# Patient Record
Sex: Female | Born: 1940 | Race: White | Hispanic: No | Smoking: Never smoker
Health system: Southern US, Community
[De-identification: ages and names within clinical notes are randomized; demographics above are authoritative.]

## PROBLEM LIST (undated history)

## (undated) ENCOUNTER — Emergency Department
Admission: EM | Discharge status: Against Medical Advice | Payer: Medicare Other | Attending: Emergency Medicine | Admitting: Emergency Medicine

## (undated) DIAGNOSIS — C801 Malignant (primary) neoplasm, unspecified: Secondary | ICD-10-CM

## (undated) DIAGNOSIS — Z950 Presence of cardiac pacemaker: Secondary | ICD-10-CM

## (undated) DIAGNOSIS — I4891 Unspecified atrial fibrillation: Secondary | ICD-10-CM

## (undated) DIAGNOSIS — I495 Sick sinus syndrome: Secondary | ICD-10-CM

## (undated) DIAGNOSIS — K219 Gastro-esophageal reflux disease without esophagitis: Secondary | ICD-10-CM

## (undated) DIAGNOSIS — I719 Aortic aneurysm of unspecified site, without rupture: Secondary | ICD-10-CM

## (undated) DIAGNOSIS — J449 Chronic obstructive pulmonary disease, unspecified: Secondary | ICD-10-CM

## (undated) DIAGNOSIS — I10 Essential (primary) hypertension: Secondary | ICD-10-CM

## (undated) DIAGNOSIS — I2510 Atherosclerotic heart disease of native coronary artery without angina pectoris: Secondary | ICD-10-CM

## (undated) DIAGNOSIS — M479 Spondylosis, unspecified: Secondary | ICD-10-CM

## (undated) DIAGNOSIS — H409 Unspecified glaucoma: Secondary | ICD-10-CM

## (undated) DIAGNOSIS — M47815 Spondylosis without myelopathy or radiculopathy, thoracolumbar region: Secondary | ICD-10-CM

## (undated) DIAGNOSIS — I509 Heart failure, unspecified: Secondary | ICD-10-CM

## (undated) DIAGNOSIS — I219 Acute myocardial infarction, unspecified: Secondary | ICD-10-CM

## (undated) DIAGNOSIS — J986 Disorders of diaphragm: Secondary | ICD-10-CM

## (undated) DIAGNOSIS — I671 Cerebral aneurysm, nonruptured: Secondary | ICD-10-CM

## (undated) DIAGNOSIS — I639 Cerebral infarction, unspecified: Secondary | ICD-10-CM

## (undated) DIAGNOSIS — M1990 Unspecified osteoarthritis, unspecified site: Secondary | ICD-10-CM

## (undated) HISTORY — DX: Gastro-esophageal reflux disease without esophagitis: K21.9

## (undated) HISTORY — DX: Sick sinus syndrome: I49.5

## (undated) HISTORY — DX: Aortic aneurysm of unspecified site, without rupture: I71.9

## (undated) HISTORY — DX: Presence of cardiac pacemaker: Z95.0

## (undated) HISTORY — DX: Essential (primary) hypertension: I10

## (undated) HISTORY — DX: Atherosclerotic heart disease of native coronary artery without angina pectoris: I25.10

## (undated) HISTORY — PX: CARDIAC PACEMAKER PLACEMENT: SHX583

## (undated) HISTORY — DX: Chronic obstructive pulmonary disease, unspecified: J44.9

## (undated) HISTORY — PX: HYSTERECTOMY: SHX81

## (undated) HISTORY — DX: Unspecified atrial fibrillation: I48.91

## (undated) HISTORY — DX: Malignant (primary) neoplasm, unspecified: C80.1

## (undated) HISTORY — PX: TOTAL KNEE ARTHROPLASTY: SHX125

## (undated) HISTORY — PX: EYE SURGERY: SHX253

---

## 2000-05-01 DIAGNOSIS — Z950 Presence of cardiac pacemaker: Secondary | ICD-10-CM

## 2000-05-01 HISTORY — DX: Presence of cardiac pacemaker: Z95.0

## 2001-01-31 ENCOUNTER — Ambulatory Visit
Admission: RE | Admit: 2001-01-31 | Discharge status: Against Medical Advice | Payer: Self-pay | Source: Ambulatory Visit

## 2001-02-11 ENCOUNTER — Ambulatory Visit
Admission: RE | Admit: 2001-02-11 | Disposition: A | Discharge status: Home / Self Care | Payer: Self-pay | Source: Ambulatory Visit

## 2001-04-03 ENCOUNTER — Inpatient Hospital Stay
Admission: RE | Admit: 2001-04-03 | Disposition: A | Discharge status: Home / Self Care | Payer: Self-pay | Source: Ambulatory Visit

## 2003-04-28 ENCOUNTER — Inpatient Hospital Stay
Admission: RE | Admit: 2003-04-28 | Disposition: A | Discharge status: Home Health | Payer: Self-pay | Source: Ambulatory Visit

## 2006-01-18 ENCOUNTER — Ambulatory Visit
Admission: RE | Admit: 2006-01-18 | Disposition: A | Discharge status: Home / Self Care | Payer: Self-pay | Source: Ambulatory Visit

## 2006-01-24 ENCOUNTER — Ambulatory Visit
Admission: RE | Admit: 2006-01-24 | Disposition: A | Discharge status: Home / Self Care | Payer: Self-pay | Source: Ambulatory Visit

## 2006-05-03 ENCOUNTER — Inpatient Hospital Stay
Admission: AD | Admit: 2006-05-03 | Disposition: A | Discharge status: Home / Self Care | Payer: Self-pay | Source: Ambulatory Visit

## 2006-06-05 ENCOUNTER — Ambulatory Visit
Admission: RE | Admit: 2006-06-05 | Disposition: A | Discharge status: Home / Self Care | Payer: Self-pay | Source: Ambulatory Visit

## 2006-07-11 ENCOUNTER — Emergency Department
Admission: EM | Admit: 2006-07-11 | Disposition: A | Discharge status: Home / Self Care | Payer: Self-pay | Source: Ambulatory Visit

## 2006-07-13 ENCOUNTER — Inpatient Hospital Stay
Admission: EM | Admit: 2006-07-13 | Disposition: A | Discharge status: Home / Self Care | Payer: Self-pay | Source: Ambulatory Visit

## 2006-12-05 ENCOUNTER — Ambulatory Visit
Admission: RE | Admit: 2006-12-05 | Disposition: A | Discharge status: Home / Self Care | Payer: Self-pay | Source: Ambulatory Visit

## 2007-03-20 ENCOUNTER — Ambulatory Visit
Admission: RE | Admit: 2007-03-20 | Disposition: A | Discharge status: Home / Self Care | Payer: Self-pay | Source: Ambulatory Visit

## 2007-07-20 ENCOUNTER — Ambulatory Visit: Admit: 2007-07-20 | Disposition: A | Discharge status: Home / Self Care | Payer: Self-pay | Source: Ambulatory Visit

## 2011-04-03 ENCOUNTER — Inpatient Hospital Stay: Admission: AD | Admit: 2011-04-03 | Disposition: A | Discharge status: Other Acute Inpatient Hospital | Payer: Self-pay

## 2012-02-10 ENCOUNTER — Encounter (HOSPITAL_COMMUNITY): Payer: Self-pay

## 2012-02-10 ENCOUNTER — Inpatient Hospital Stay (HOSPITAL_COMMUNITY)
Admission: EM | Admit: 2012-02-10 | Discharge: 2012-02-14 | DRG: 193 | Disposition: A | Discharge status: Home / Self Care | Payer: Medicare Other | Attending: Internal Medicine | Admitting: Internal Medicine

## 2012-02-10 ENCOUNTER — Emergency Department (HOSPITAL_COMMUNITY): Payer: Medicare Other

## 2012-02-10 ENCOUNTER — Other Ambulatory Visit: Payer: Self-pay

## 2012-02-10 DIAGNOSIS — D696 Thrombocytopenia, unspecified: Secondary | ICD-10-CM | POA: Diagnosis present

## 2012-02-10 DIAGNOSIS — I252 Old myocardial infarction: Secondary | ICD-10-CM

## 2012-02-10 DIAGNOSIS — Z85828 Personal history of other malignant neoplasm of skin: Secondary | ICD-10-CM

## 2012-02-10 DIAGNOSIS — E875 Hyperkalemia: Secondary | ICD-10-CM | POA: Diagnosis present

## 2012-02-10 DIAGNOSIS — J441 Chronic obstructive pulmonary disease with (acute) exacerbation: Secondary | ICD-10-CM

## 2012-02-10 DIAGNOSIS — R042 Hemoptysis: Secondary | ICD-10-CM

## 2012-02-10 DIAGNOSIS — J9600 Acute respiratory failure, unspecified whether with hypoxia or hypercapnia: Secondary | ICD-10-CM

## 2012-02-10 DIAGNOSIS — Z950 Presence of cardiac pacemaker: Secondary | ICD-10-CM

## 2012-02-10 DIAGNOSIS — M1990 Unspecified osteoarthritis, unspecified site: Secondary | ICD-10-CM | POA: Diagnosis present

## 2012-02-10 DIAGNOSIS — I4891 Unspecified atrial fibrillation: Secondary | ICD-10-CM | POA: Diagnosis present

## 2012-02-10 DIAGNOSIS — Z23 Encounter for immunization: Secondary | ICD-10-CM

## 2012-02-10 DIAGNOSIS — Z7901 Long term (current) use of anticoagulants: Secondary | ICD-10-CM

## 2012-02-10 DIAGNOSIS — Z9981 Dependence on supplemental oxygen: Secondary | ICD-10-CM

## 2012-02-10 DIAGNOSIS — J189 Pneumonia, unspecified organism: Principal | ICD-10-CM | POA: Diagnosis present

## 2012-02-10 DIAGNOSIS — I509 Heart failure, unspecified: Secondary | ICD-10-CM | POA: Diagnosis present

## 2012-02-10 DIAGNOSIS — Z8673 Personal history of transient ischemic attack (TIA), and cerebral infarction without residual deficits: Secondary | ICD-10-CM

## 2012-02-10 DIAGNOSIS — G9340 Encephalopathy, unspecified: Secondary | ICD-10-CM | POA: Diagnosis present

## 2012-02-10 DIAGNOSIS — R410 Disorientation, unspecified: Secondary | ICD-10-CM

## 2012-02-10 DIAGNOSIS — E860 Dehydration: Secondary | ICD-10-CM | POA: Diagnosis present

## 2012-02-10 DIAGNOSIS — I619 Nontraumatic intracerebral hemorrhage, unspecified: Secondary | ICD-10-CM

## 2012-02-10 DIAGNOSIS — E785 Hyperlipidemia, unspecified: Secondary | ICD-10-CM | POA: Diagnosis present

## 2012-02-10 DIAGNOSIS — J449 Chronic obstructive pulmonary disease, unspecified: Secondary | ICD-10-CM

## 2012-02-10 DIAGNOSIS — D649 Anemia, unspecified: Secondary | ICD-10-CM

## 2012-02-10 DIAGNOSIS — I10 Essential (primary) hypertension: Secondary | ICD-10-CM | POA: Diagnosis present

## 2012-02-10 DIAGNOSIS — I671 Cerebral aneurysm, nonruptured: Secondary | ICD-10-CM

## 2012-02-10 DIAGNOSIS — F411 Generalized anxiety disorder: Secondary | ICD-10-CM | POA: Diagnosis present

## 2012-02-10 HISTORY — DX: Malignant (primary) neoplasm, unspecified: C80.1

## 2012-02-10 HISTORY — DX: Cerebral infarction, unspecified: I63.9

## 2012-02-10 HISTORY — DX: Presence of cardiac pacemaker: Z95.0

## 2012-02-10 HISTORY — DX: Disorders of diaphragm: J98.6

## 2012-02-10 HISTORY — DX: Cerebral aneurysm, nonruptured: I67.1

## 2012-02-10 HISTORY — DX: Spondylosis, unspecified: M47.9

## 2012-02-10 HISTORY — DX: Unspecified glaucoma: H40.9

## 2012-02-10 HISTORY — DX: Acute myocardial infarction, unspecified: I21.9

## 2012-02-10 HISTORY — DX: Spondylosis without myelopathy or radiculopathy, thoracolumbar region: M47.815

## 2012-02-10 HISTORY — DX: Unspecified atrial fibrillation: I48.91

## 2012-02-10 HISTORY — DX: Essential (primary) hypertension: I10

## 2012-02-10 HISTORY — DX: Chronic obstructive pulmonary disease, unspecified: J44.9

## 2012-02-10 HISTORY — DX: Heart failure, unspecified: I50.9

## 2012-02-10 HISTORY — DX: Unspecified osteoarthritis, unspecified site: M19.90

## 2012-02-10 MED ORDER — ALBUTEROL SULFATE (5 MG/ML) 0.5% IN NEBU
2.5000 mg | INHALATION_SOLUTION | Freq: Once | RESPIRATORY_TRACT | Status: AC
  Administered 2012-02-10: 2.5 mg via RESPIRATORY_TRACT
  Filled 2012-02-10: qty 0.5

## 2012-02-10 MED ORDER — IPRATROPIUM BROMIDE 0.02 % IN SOLN
0.5000 mg | Freq: Once | RESPIRATORY_TRACT | Status: AC
  Administered 2012-02-10: 0.5 mg via RESPIRATORY_TRACT
  Filled 2012-02-10: qty 2.5

## 2012-02-10 NOTE — ED Provider Notes (Signed)
History     CSN: [id number]  Arrival date & time 02/10/12  2243   First MD Initiated Contact with Patient 02/10/12 2259      Chief Complaint  Patient presents with  . Shortness of Breath  . Altered Mental Status    (Consider location/radiation/quality/duration/timing/severity/associated sxs/prior treatment) HPI 71 year old female presents to emergency room via EMS from her daughter's house with report of shortness of breath along with altered mental status. Patient is visiting from [place]. Patient with history of COPD, atrial fibrillation, hypertension, coronary disease status post MI. Daughter reports patient was in normal health today, took a breathing treatment around 2 PM and then took a nap. Upon waking from the mouth, patient was increasingly more short of breath and confused. Family has noted that she has been eating more congested over the last 24 hours. They deny any fevers. They report patient often has respiratory troubles, usually goes to the local emergency department gets fluid and antibiotics and sent home. They're unsure of the last time she had a respiratory problem. No prior records available. Patient is confused oriented only to self, and is not a reliable historian  Past Medical History  Diagnosis Date  . Arthritis     DJD  . COPD (chronic obstructive pulmonary disease)   . Cancer     skin cancer  . Pacemaker 2002  . A-fib   . Hypertension   . Stroke   . MI (myocardial infarction)   . Brain aneurysm   . Glaucoma     Past Surgical History  Procedure Date  . Total knee arthroplasty     bilaterally  . Eye surgery     catarcts    No family history on file.  History  Substance Use Topics  . Smoking status: Never Smoker   . Smokeless tobacco: Not on file  . Alcohol Use: No    OB History    Grav Para Term Preterm Abortions TAB SAB Ect Mult Living                  Review of Systems  Unable to perform ROS: Mental status change    Allergies    Codeine; Morphine and related; and Sulfa antibiotics  Home Medications   Current Outpatient Rx  Name Route Sig Dispense Refill  . ALBUTEROL SULFATE HFA 108 (90 BASE) MCG/ACT IN AERS Inhalation Inhale 2 puffs into the lungs every 6 (six) hours as needed. For shortness of breath    . ATENOLOL 100 MG PO TABS Oral Take 100 mg by mouth daily.    Marland Kitchen CLONAZEPAM 0.5 MG PO TABS Oral Take 0.5 mg by mouth 2 (two) times daily as needed. For anxiety    . DILTIAZEM HCL ER BEADS 240 MG PO CP24 Oral Take 240 mg by mouth daily.    . FUROSEMIDE 40 MG PO TABS Oral Take 40 mg by mouth daily.    Marland Kitchen HYDROCODONE-ACETAMINOPHEN 10-325 MG PO TABS Oral Take 1 tablet by mouth 3 (three) times daily as needed. For pain    . OMEPRAZOLE 20 MG PO CPDR Oral Take 20 mg by mouth daily.    Marland Kitchen POTASSIUM CHLORIDE CRYS ER 20 MEQ PO TBCR Oral Take 40 mEq by mouth daily.    Marland Kitchen RANITIDINE HCL 150 MG PO TABS Oral Take 150 mg by mouth 2 (two) times daily.    Marland Kitchen ROSUVASTATIN CALCIUM 10 MG PO TABS Oral Take 10 mg by mouth at bedtime.    . SPIRONOLACTONE 50 MG  PO TABS Oral Take 50 mg by mouth daily.    Marland Kitchen VALSARTAN 80 MG PO TABS Oral Take 80 mg by mouth daily.    . WARFARIN SODIUM 5 MG PO TABS Oral Take 2.5 mg by mouth daily.      BP 98/62  Pulse 86  Temp 98.8 F (37.1 C) (Oral)  Resp 18  SpO2 95%  Physical Exam  Nursing note and vitals reviewed. Constitutional:       Frail, elderly  HENT:  Head: Normocephalic and atraumatic.  Nose: Nose normal.  Mouth/Throat: Oropharynx is clear and moist. No oropharyngeal exudate.  Eyes: Conjunctivae normal and EOM are normal. Pupils are equal, round, and reactive to light.  Neck: Normal range of motion. Neck supple. No JVD present. No tracheal deviation present. No thyromegaly present.  Cardiovascular: Exam reveals no gallop and no friction rub.   Murmur heard.      Tachycardia with irregular rate noted  Pulmonary/Chest: No stridor. She is in respiratory distress (mild respiratory  distress). She has wheezes. She exhibits no tenderness.       Rhonchi noted in bilateral lung fields  Abdominal: Soft. Bowel sounds are normal. She exhibits no distension and no mass. There is no tenderness. There is no rebound and no guarding.  Musculoskeletal: Normal range of motion. She exhibits no edema and no tenderness.  Lymphadenopathy:    She has no cervical adenopathy.  Neurological: She is alert. She exhibits normal muscle tone. Coordination normal.       Patient is oriented only to self, is unable to answer questions more than yes or no  Skin: Skin is warm and dry. No rash noted. No erythema. No pallor.    ED Course  Procedures (including critical care time)  Labs Reviewed  PRO B NATRIURETIC PEPTIDE - Abnormal; Notable for the following:    Pro B Natriuretic peptide (BNP) 2149.0 (*)     All other components within normal limits  COMPREHENSIVE METABOLIC PANEL - Abnormal; Notable for the following:    Potassium 5.6 (*)     Glucose, Bld 123 (*)     BUN 45 (*)     Creatinine, Ser 1.14 (*)     Calcium 11.2 (*)     GFR calc non Af Amer 47 (*)     GFR calc Af Amer 54 (*)     All other components within normal limits  URINALYSIS, ROUTINE W REFLEX MICROSCOPIC - Abnormal; Notable for the following:    Leukocytes, UA SMALL (*)     All other components within normal limits  CBC WITH DIFFERENTIAL - Abnormal; Notable for the following:    WBC 10.6 (*)     Platelets 109 (*)  PLATELET COUNT CONFIRMED BY SMEAR   Neutrophils Relative 88 (*)     Neutro Abs 9.3 (*)     Lymphocytes Relative 6 (*)     Lymphs Abs 0.6 (*)     All other components within normal limits  PROTIME-INR - Abnormal; Notable for the following:    Prothrombin Time 25.2 (*)     INR 2.42 (*)     All other components within normal limits  URINE MICROSCOPIC-ADD ON - Abnormal; Notable for the following:    Squamous Epithelial / LPF FEW (*)     Bacteria, UA FEW (*)     All other components within normal limits   TROPONIN I  LACTIC ACID, PLASMA  CULTURE, BLOOD (ROUTINE X 2)  CULTURE, BLOOD (ROUTINE X 2)  GRAM STAIN  LEGIONELLA ANTIGEN, URINE  STREP PNEUMONIAE URINARY ANTIGEN  COMPREHENSIVE METABOLIC PANEL  CBC WITH DIFFERENTIAL  CULTURE, EXPECTORATED SPUTUM-ASSESSMENT  CALCIUM, IONIZED  TROPONIN I  TROPONIN I  TROPONIN I   Dg Chest Port 1 View  02/10/2012  *RADIOLOGY REPORT*  Clinical Data: Altered mental status.  Shortness of breath.  COPD. Prior myocardial infarction.  PORTABLE CHEST - 1 VIEW  Comparison: None.  Findings: The patient is rotated to the left on today's exam, resulting in reduced diagnostic sensitivity and specificity.   Dual lead pacer noted with cardiomegaly and atherosclerotic calcification of the aortic arch.  Airspace opacity noted in the left lower lobe, obscuring the left hemidiaphragm.  Linear atelectasis or scarring noted along the right hemidiaphragm.  IMPRESSION: 1.  Left lower lobe atelectasis or pneumonia.  2.  Cardiomegaly without overt edema.  3.  Subsegmental atelectasis or scarring along the right hemidiaphragm.  4.  Dual lead pacer in place.   Original Report Authenticated By: Dellia Cloud, M.D.     Date: 02/10/2012  Rate: 105  Rhythm: atrial fibrillation  QRS Axis: normal  Intervals: atrial fibrillation  ST/T Wave abnormalities: t wave inversions diffusely  Conduction Disutrbances:atrial fibrillation  Narrative Interpretation:   Old EKG Reviewed: none available     1. Delirium   2. Community acquired pneumonia   3. A-fib   4. COPD (chronic obstructive pulmonary disease)   5. Atrial fibrillation   6. Dehydration   7. Encephalopathy       MDM  71 year old female with shortness of breath and altered mental status. I am concern for possible pneumonia, possible confusion secondary to hypercarbia, urosepsis.  Will check labs, cxr, ua.  Pt with wheezing, will give neb tx.  12:02 AM Pt c/o being unable to breathe.  Her wheezing has resolved  with the breathing treatment, and sats have improved from low 90s to mid 90s.  Possible anxiety component.  Will monitor closely.        Olivia Mackie, MD 02/11/12 [phone]

## 2012-02-10 NOTE — ED Notes (Addendum)
Afternoon Pt began feeling bad took neb tx and lay down and slept until dinner when she woke up for dinner and was SOB. Pt attempted to lay back down but was positionally dyspneic. Pt resited being brought to ED by family until EMS called approx 2230. Pt noted to be confused to date and time and responding inappropriately. Pt is HOH. Pts daughter is at [facility] and both are from [place].

## 2012-02-11 ENCOUNTER — Observation Stay (HOSPITAL_COMMUNITY): Payer: Medicare Other

## 2012-02-11 ENCOUNTER — Encounter (HOSPITAL_COMMUNITY): Payer: Self-pay | Admitting: *Deleted

## 2012-02-11 DIAGNOSIS — E860 Dehydration: Secondary | ICD-10-CM | POA: Diagnosis present

## 2012-02-11 DIAGNOSIS — E875 Hyperkalemia: Secondary | ICD-10-CM | POA: Diagnosis present

## 2012-02-11 DIAGNOSIS — G9340 Encephalopathy, unspecified: Secondary | ICD-10-CM | POA: Diagnosis present

## 2012-02-11 DIAGNOSIS — J189 Pneumonia, unspecified organism: Secondary | ICD-10-CM | POA: Diagnosis present

## 2012-02-11 DIAGNOSIS — J441 Chronic obstructive pulmonary disease with (acute) exacerbation: Secondary | ICD-10-CM

## 2012-02-11 DIAGNOSIS — I10 Essential (primary) hypertension: Secondary | ICD-10-CM | POA: Diagnosis present

## 2012-02-11 DIAGNOSIS — D696 Thrombocytopenia, unspecified: Secondary | ICD-10-CM | POA: Diagnosis present

## 2012-02-11 DIAGNOSIS — I4891 Unspecified atrial fibrillation: Secondary | ICD-10-CM | POA: Diagnosis present

## 2012-02-11 LAB — LACTIC ACID, PLASMA: Lactic Acid, Venous: 1.2 mmol/L (ref 0.5–2.2)

## 2012-02-11 LAB — CBC WITH DIFFERENTIAL/PLATELET
Basophils Absolute: 0 10*3/uL (ref 0.0–0.1)
Basophils Relative: 0 % (ref 0–1)
Eosinophils Absolute: 0 10*3/uL (ref 0.0–0.7)
HCT: 38.4 % (ref 36.0–46.0)
Hemoglobin: 11.5 g/dL — ABNORMAL LOW (ref 12.0–15.0)
Lymphocytes Relative: 15 % (ref 12–46)
Lymphs Abs: 1.4 10*3/uL (ref 0.7–4.0)
MCH: 30.9 pg (ref 26.0–34.0)
MCHC: 33.1 g/dL (ref 30.0–36.0)
Monocytes Absolute: 0.6 10*3/uL (ref 0.1–1.0)
Monocytes Relative: 11 % (ref 3–12)
Neutro Abs: 9.3 10*3/uL — ABNORMAL HIGH (ref 1.7–7.7)
Neutro Abs: 6.7 10*3/uL (ref 1.7–7.7)
Neutrophils Relative %: 88 % — ABNORMAL HIGH (ref 43–77)
Neutrophils Relative %: 74 % (ref 43–77)
Platelets: 109 10*3/uL — ABNORMAL LOW (ref 150–400)
RBC: 3.72 MIL/uL — ABNORMAL LOW (ref 3.87–5.11)
RDW: 13.5 % (ref 11.5–15.5)
WBC: 10.6 10*3/uL — ABNORMAL HIGH (ref 4.0–10.5)

## 2012-02-11 LAB — URINALYSIS, ROUTINE W REFLEX MICROSCOPIC
Ketones, ur: NEGATIVE mg/dL
Nitrite: NEGATIVE
Protein, ur: NEGATIVE mg/dL
Urobilinogen, UA: 0.2 mg/dL (ref 0.0–1.0)

## 2012-02-11 LAB — COMPREHENSIVE METABOLIC PANEL
AST: 20 U/L (ref 0–37)
Alkaline Phosphatase: 97 U/L (ref 39–117)
BUN: 45 mg/dL — ABNORMAL HIGH (ref 6–23)
CO2: 24 mEq/L (ref 19–32)
Calcium: 10.1 mg/dL (ref 8.4–10.5)
Chloride: 101 mEq/L (ref 96–112)
Creatinine, Ser: 1.14 mg/dL — ABNORMAL HIGH (ref 0.50–1.10)
Creatinine, Ser: 0.81 mg/dL (ref 0.50–1.10)
GFR calc Af Amer: 54 mL/min — ABNORMAL LOW (ref >90)
GFR calc non Af Amer: 47 mL/min — ABNORMAL LOW (ref >90)
GFR calc non Af Amer: 70 mL/min — ABNORMAL LOW (ref >90)
Glucose, Bld: 123 mg/dL — ABNORMAL HIGH (ref 70–99)
Potassium: 5.6 mEq/L — ABNORMAL HIGH (ref 3.5–5.1)
Total Bilirubin: 0.5 mg/dL (ref 0.3–1.2)

## 2012-02-11 LAB — URINE MICROSCOPIC-ADD ON

## 2012-02-11 LAB — TROPONIN I: Troponin I: <0.3 ng/mL (ref <0.30)

## 2012-02-11 LAB — EXPECTORATED SPUTUM ASSESSMENT W GRAM STAIN, RFLX TO RESP C: Special Requests: NORMAL

## 2012-02-11 LAB — STREP PNEUMONIAE URINARY ANTIGEN: Strep Pneumo Urinary Antigen: NEGATIVE

## 2012-02-11 LAB — CALCIUM, IONIZED: Calcium, Ion: 1.33 mmol/L — ABNORMAL HIGH (ref 1.12–1.32)

## 2012-02-11 LAB — PROTIME-INR: Prothrombin Time: 25.2 seconds — ABNORMAL HIGH (ref 11.6–15.2)

## 2012-02-11 MED ORDER — DEXTROSE 5 % IV SOLN
1.0000 g | INTRAVENOUS | Status: DC
  Administered 2012-02-11 – 2012-02-13 (×3): 1 g via INTRAVENOUS
  Filled 2012-02-11 (×5): qty 10

## 2012-02-11 MED ORDER — DEXTROSE 5 % IV SOLN
500.0000 mg | Freq: Once | INTRAVENOUS | Status: AC
  Administered 2012-02-11: 500 mg via INTRAVENOUS
  Filled 2012-02-11: qty 500

## 2012-02-11 MED ORDER — ATORVASTATIN CALCIUM 20 MG PO TABS
20.0000 mg | ORAL_TABLET | Freq: Every day | ORAL | Status: DC
  Administered 2012-02-11 – 2012-02-13 (×3): 20 mg via ORAL
  Filled 2012-02-11 (×4): qty 1

## 2012-02-11 MED ORDER — SODIUM CHLORIDE 0.9 % IV SOLN: 250.0000 mL | INTRAVENOUS | Status: DC | PRN

## 2012-02-11 MED ORDER — BENZONATATE 100 MG PO CAPS
100.0000 mg | ORAL_CAPSULE | Freq: Two times a day (BID) | ORAL | Status: DC | PRN
  Administered 2012-02-11: 100 mg via ORAL
  Filled 2012-02-11 (×2): qty 1

## 2012-02-11 MED ORDER — ALBUTEROL SULFATE (5 MG/ML) 0.5% IN NEBU
2.5000 mg | INHALATION_SOLUTION | RESPIRATORY_TRACT | Status: DC | PRN
  Filled 2012-02-11: qty 0.5

## 2012-02-11 MED ORDER — CLONAZEPAM 0.5 MG PO TABS
0.5000 mg | ORAL_TABLET | Freq: Two times a day (BID) | ORAL | Status: DC
  Administered 2012-02-11 – 2012-02-14 (×7): 0.5 mg via ORAL
  Filled 2012-02-11 (×7): qty 1

## 2012-02-11 MED ORDER — LORAZEPAM 1 MG PO TABS
0.5000 mg | ORAL_TABLET | Freq: Once | ORAL | Status: AC
  Administered 2012-02-11: 0.5 mg via ORAL
  Filled 2012-02-11: qty 1

## 2012-02-11 MED ORDER — METHYLPREDNISOLONE SODIUM SUCC 125 MG IJ SOLR
60.0000 mg | Freq: Two times a day (BID) | INTRAMUSCULAR | Status: DC
  Administered 2012-02-11 – 2012-02-12 (×3): 60 mg via INTRAVENOUS
  Filled 2012-02-11 (×4): qty 0.96

## 2012-02-11 MED ORDER — PANTOPRAZOLE SODIUM 40 MG PO TBEC
40.0000 mg | DELAYED_RELEASE_TABLET | Freq: Every day | ORAL | Status: DC
  Administered 2012-02-11 – 2012-02-14 (×4): 40 mg via ORAL
  Filled 2012-02-11 (×5): qty 1

## 2012-02-11 MED ORDER — HYDROCODONE-ACETAMINOPHEN 5-325 MG PO TABS
1.0000 | ORAL_TABLET | Freq: Four times a day (QID) | ORAL | Status: DC | PRN
  Administered 2012-02-11 – 2012-02-14 (×6): 1 via ORAL
  Filled 2012-02-11 (×6): qty 1

## 2012-02-11 MED ORDER — POLYETHYLENE GLYCOL 3350 17 G PO PACK
17.0000 g | PACK | Freq: Every day | ORAL | Status: DC | PRN
  Filled 2012-02-11: qty 1

## 2012-02-11 MED ORDER — FAMOTIDINE 10 MG PO TABS
10.0000 mg | ORAL_TABLET | Freq: Every day | ORAL | Status: DC
  Administered 2012-02-11 – 2012-02-14 (×4): 10 mg via ORAL
  Filled 2012-02-11 (×4): qty 1

## 2012-02-11 MED ORDER — INFLUENZA VIRUS VACC SPLIT PF IM SUSP
0.5000 mL | INTRAMUSCULAR | Status: AC
  Filled 2012-02-11: qty 0.5

## 2012-02-11 MED ORDER — DM-GUAIFENESIN ER 30-600 MG PO TB12
1.0000 | ORAL_TABLET | Freq: Two times a day (BID) | ORAL | Status: DC
  Administered 2012-02-11 – 2012-02-14 (×7): 1 via ORAL
  Filled 2012-02-11 (×9): qty 1

## 2012-02-11 MED ORDER — SODIUM CHLORIDE 0.9 % IJ SOLN
3.0000 mL | Freq: Two times a day (BID) | INTRAMUSCULAR | Status: DC
  Administered 2012-02-11 – 2012-02-13 (×4): 3 mL via INTRAVENOUS

## 2012-02-11 MED ORDER — SODIUM POLYSTYRENE SULFONATE 15 GM/60ML PO SUSP
30.0000 g | Freq: Once | ORAL | Status: AC
  Administered 2012-02-11: 30 g via ORAL
  Filled 2012-02-11: qty 60

## 2012-02-11 MED ORDER — SENNOSIDES-DOCUSATE SODIUM 8.6-50 MG PO TABS
1.0000 | ORAL_TABLET | Freq: Every evening | ORAL | Status: DC | PRN
  Administered 2012-02-11: 1 via ORAL
  Filled 2012-02-11 (×2): qty 1

## 2012-02-11 MED ORDER — ALBUTEROL SULFATE HFA 108 (90 BASE) MCG/ACT IN AERS: 2.0000 | INHALATION_SPRAY | Freq: Four times a day (QID) | RESPIRATORY_TRACT | Status: DC | PRN

## 2012-02-11 MED ORDER — IPRATROPIUM BROMIDE 0.02 % IN SOLN
0.5000 mg | Freq: Four times a day (QID) | RESPIRATORY_TRACT | Status: DC
  Administered 2012-02-11 – 2012-02-13 (×7): 0.5 mg via RESPIRATORY_TRACT
  Filled 2012-02-11 (×9): qty 2.5

## 2012-02-11 MED ORDER — SODIUM CHLORIDE 0.9 % IV SOLN
INTRAVENOUS | Status: DC
  Administered 2012-02-11: 06:00:00 via INTRAVENOUS

## 2012-02-11 MED ORDER — ALBUTEROL SULFATE (5 MG/ML) 0.5% IN NEBU
2.5000 mg | INHALATION_SOLUTION | Freq: Four times a day (QID) | RESPIRATORY_TRACT | Status: DC
  Administered 2012-02-11 – 2012-02-13 (×7): 2.5 mg via RESPIRATORY_TRACT
  Filled 2012-02-11 (×8): qty 0.5

## 2012-02-11 MED ORDER — SODIUM CHLORIDE 0.9 % IJ SOLN: 3.0000 mL | INTRAMUSCULAR | Status: DC | PRN

## 2012-02-11 MED ORDER — AZITHROMYCIN 500 MG PO TABS
500.0000 mg | ORAL_TABLET | ORAL | Status: DC
  Administered 2012-02-11 – 2012-02-13 (×3): 500 mg via ORAL
  Filled 2012-02-11 (×4): qty 1

## 2012-02-11 MED ORDER — DEXTROSE 5 % IV SOLN
1.0000 g | Freq: Once | INTRAVENOUS | Status: AC
  Administered 2012-02-11: 1 g via INTRAVENOUS
  Filled 2012-02-11: qty 10

## 2012-02-11 NOTE — ED Notes (Signed)
Antibiotics started after blood cultures were drawn.

## 2012-02-11 NOTE — ED Notes (Signed)
Paulette Blanch (daughter)[phone], call if need anything

## 2012-02-11 NOTE — H&P (Signed)
PCP:  unknown   Chief Complaint:   Confusion and shortness of breath.  HPI: Beth Greer is a 71 y.o. female   has a past medical history of Arthritis; COPD (chronic obstructive pulmonary disease); Cancer; Pacemaker (2002); A-fib; Hypertension; Stroke; MI (myocardial infarction); Brain aneurysm; and Glaucoma.   Presented with  Patient is confused and unable to provide much of any history. No family at bedside. Per ER MD she was brought in from home with confusion and worsening shortness of breath. She has history of COPD and apparently became more confused after doing her breathing treatment today. She was brought in to ED  XR showed possible PNA labs were significant for mild hypercalcemia, elevated K at 5.6 and thrombocytopenia 109. No prior labs are available. ABG was attempted but unsuccessfully. She has hx of atrial fibrillation and is on coumadin.   Review of Systems:  Unable to obtain.   Past Medical History: Past Medical History  Diagnosis Date  . Arthritis     DJD  . COPD (chronic obstructive pulmonary disease)   . Cancer     skin cancer  . Pacemaker 2002  . A-fib   . Hypertension   . Stroke   . MI (myocardial infarction)   . Brain aneurysm   . Glaucoma    Past Surgical History  Procedure Date  . Total knee arthroplasty     bilaterally  . Eye surgery     catarcts     Medications: Prior to Admission medications   Medication Sig Start Date End Date Taking? Authorizing Provider  albuterol (PROVENTIL HFA;VENTOLIN HFA) 108 (90 BASE) MCG/ACT inhaler Inhale 2 puffs into the lungs every 6 (six) hours as needed. For shortness of breath   Yes Historical Provider, MD  atenolol (TENORMIN) 100 MG tablet Take 100 mg by mouth daily.   Yes Historical Provider, MD  CALCIUM CARBONATE PO Take 1 tablet by mouth daily.   Yes Historical Provider, MD  clonazePAM (KLONOPIN) 0.5 MG tablet Take 0.5 mg by mouth 2 (two) times daily as needed. For anxiety   Yes Historical Provider,  MD  diltiazem (TIAZAC) 240 MG 24 hr capsule Take 240 mg by mouth daily.   Yes Historical Provider, MD  furosemide (LASIX) 40 MG tablet Take 40 mg by mouth daily.   Yes Historical Provider, MD  HYDROcodone-acetaminophen (NORCO) 10-325 MG per tablet Take 1 tablet by mouth 3 (three) times daily as needed. For pain   Yes Historical Provider, MD  omeprazole (PRILOSEC) 20 MG capsule Take 20 mg by mouth daily.   Yes Historical Provider, MD  potassium chloride SA (K-DUR,KLOR-CON) 20 MEQ tablet Take 40 mEq by mouth daily.   Yes Historical Provider, MD  ranitidine (ZANTAC) 150 MG tablet Take 150 mg by mouth 2 (two) times daily.   Yes Historical Provider, MD  rosuvastatin (CRESTOR) 10 MG tablet Take 10 mg by mouth at bedtime.   Yes Historical Provider, MD  spironolactone (ALDACTONE) 50 MG tablet Take 50 mg by mouth daily.   Yes Historical Provider, MD  valsartan (DIOVAN) 80 MG tablet Take 80 mg by mouth daily.   Yes Historical Provider, MD  warfarin (COUMADIN) 5 MG tablet Take 2.5 mg by mouth daily.   Yes Historical Provider, MD    Allergies:   Allergies  Allergen Reactions  . Codeine     unknown  . Morphine And Related     unknown  . Sulfa Antibiotics     unknown    Social History:  reports that she has never smoked. She does not have any smokeless tobacco history on file. She reports that she does not drink alcohol.   Family History: family history is not on file.    Physical Exam: Patient Vitals for the past 24 hrs:  BP Temp Temp src Pulse Resp SpO2  02/11/12 0211 - 98.2 F (36.8 C) Oral - - -  02/11/12 0200 108/81 mmHg - - 97  19  96 %  02/11/12 0045 137/85 mmHg - - 98  24  96 %  02/11/12 0000 129/70 mmHg - - 107  13  96 %  02/10/12 2340 - - - 86  18  95 %  02/10/12 2331 98/62 mmHg - - 96  22  93 %  02/10/12 2330 98/62 mmHg - - 108  22  96 %  02/10/12 2307 123/86 mmHg 98.8 F (37.1 C) Oral 94  20  93 %    1. General:  in No Acute distress 2. Psychological: Alert but not   Oriented 3. Head/ENT:    Dry Mucous Membranes                          Head Non traumatic, neck supple                          Normal Dentition 4. SKIN: decreased Skin turgor,  Skin clean Dry and intact no rash 5. Heart: Regular rate and rhythm no Murmur, Rub or gallop 6. Lungs: no wheezes but some coarse crackles noted   7. Abdomen: Soft, non-tender, Non distended 8. Lower extremities: no clubbing, cyanosis, or edema 9. Neurologically Grossly intact, moving all 4 extremities equally 10. MSK: Normal range of motion  body mass index is unknown because there is no height or weight on file.   Labs on Admission:   [facility] 02/10/12 2315  NA 135  K 5.6*  CL 101  CO2 22  GLUCOSE 123*  BUN 45*  CREATININE 1.14*  CALCIUM 11.2*  MG --  PHOS --    Basename 02/10/12 2315  AST 24  ALT 14  ALKPHOS 97  BILITOT 0.5  PROT 7.5  ALBUMIN 4.6   No results found for this basename: LIPASE:2,AMYLASE:2 in the last 72 hours  Basename 02/10/12 2315  WBC 10.6*  NEUTROABS 9.3*  HGB 12.7  HCT 38.4  MCV 94.8  PLT 109*    Basename 02/10/12 2315  CKTOTAL --  CKMB --  CKMBINDEX --  TROPONINI <0.30   No results found for this basename: TSH,T4TOTAL,FREET3,T3FREE,THYROIDAB in the last 72 hours No results found for this basename: VITAMINB12:2,FOLATE:2,FERRITIN:2,TIBC:2,IRON:2,RETICCTPCT:2 in the last 72 hours No results found for this basename: HGBA1C    CrCl is unknown because there is no height on file for the current visit. ABG No results found for this basename: phart, pco2, po2, hco3, tco2, acidbasedef, o2sat     No results found for this basename: DDIMER    Lactic acid 1.2  Other results:  I have pearsonaly reviewed this: ECG REPORT  Rate: 105  Rhythm: Atrial fibrilation  ST&T Change: diffuse ST changes.  UA no evidence of infection BNP 2149  Cultures: No results found for this basename: sdes, specrequest, cult, reptstatus       Radiological Exams on  Admission: Dg Chest Port 1 View  02/10/2012  *RADIOLOGY REPORT*  Clinical Data: Altered mental status.  Shortness of breath.  COPD. Prior myocardial infarction.  PORTABLE CHEST - 1 VIEW  Comparison: None.  Findings: The patient is rotated to the left on today's exam, resulting in reduced diagnostic sensitivity and specificity.   Dual lead pacer noted with cardiomegaly and atherosclerotic calcification of the aortic arch.  Airspace opacity noted in the left lower lobe, obscuring the left hemidiaphragm.  Linear atelectasis or scarring noted along the right hemidiaphragm.  IMPRESSION: 1.  Left lower lobe atelectasis or pneumonia.  2.  Cardiomegaly without overt edema.  3.  Subsegmental atelectasis or scarring along the right hemidiaphragm.  4.  Dual lead pacer in place.   Original Report Authenticated By: Dellia Cloud, M.D.     Chart has been reviewed  Assessment/Plan  71 yo F with altered mental status and evidence of PNA on CXR  Present on Admission:  .Community acquired pneumonia - will admit and initiate IV antibiotics, obtain sputum cultures .Encephalopathy - likely multifactorial, could be due to infection, hypercalcemia is very mild, could be due to medication withdrawal will make sure she is taking her klonapin. Will obtain CT of the head. If no improvement consider further imaging in AM  .Hypertension - hold her medications given transient hypotension in ED. Marland KitchenAtrial fibrillation - continue warfarin after Head CT is done is no evidence of hemorrhage.  .Thrombocytopenia - Unclear if this is acute in onset will continue to monitor would be helpful to obtain records.  .Dehydration - Will give gentle IVF .Hypercalcemia - give IVF and repeat Hyperkalemia - patient was given kayexalate in ED will follow up K in am and monitor on telemetry    Prophylaxis: SCD  Protonix  CODE STATUS: presumed to be FULL CODE but would need to discuss in AM.  Other plan as per orders.  I have spent  a total of 55 min on this admission  Annaleah Arata 02/11/2012, 2:59 AM

## 2012-02-11 NOTE — Progress Notes (Signed)
  Echocardiogram 2D Echocardiogram has been performed.  [name] 02/11/2012, 11:39 AM

## 2012-02-11 NOTE — Progress Notes (Addendum)
[facility] PROGRESS NOTE  ANABIA WEATHERWAX ZOX:[id number] DOB: February 07, 1941 DOA: 02/10/2012 PCP: Provider Not In System  Assessment/Plan:  Patient Active Problem List  Diagnosis  . Community acquired pneumonia  . Encephalopathy  . Hypertension  . Atrial fibrillation  . Thrombocytopenia  . Dehydration  . Hypercalcemia  . Hyperkalemia    Confusion/AMS:  Resolved.  Was likely due to PNA/COPD exacerbation that led to dehydration from poor PO and continued use of potassium/lasix.  She also takes narcotics and may have had some lingering effects in the setting of dehydration-related AKI.    CT head showed possible calcification versus small hemorrhagic stroke in basal ganglion.  Patient has known small aneurysm -  Repeat dry head CT  Community acquired pneumonia:  Patient presented with possible left lower lobe pneumonia versus atelectasis and increased work of breathing.  No fevers or leukocytosis. -  Strep pneumo ag neg -  Ceftriaxone, day 1 -  Azithromycin, day 1  Acute COPD exacerbation:  Patient uses home oxygen.  Per family, she has had multiple hospitalizations for COPD, but never intubated, never in the ICU.   -  Start solumedrol -  Duonebs q6h -  Alb q2h prn -  Continue abx as above -  Peak flow  Dehydration with hyperkalemia and hypercalcemia and mildly elevated BUN:Cr:  Improved after gentle hydration overnight.  Patient has history of CHF and is on lasix, potassium, tums at home.   - Continue to hold lasix -  D/C IVF  CHF/atrial fibrillation/HTN/HLD:  Patient still appears a little dry, but is starting to eat and drink.  Telemetry demonstrates atrial fibrillation with RVR to 120s intermittently.  ECG are stable with a-fib and T wave inversions in inferior leads and in V3-V6.  Blood pressure still in low end of normal range.  Currently holding BB and dilt.   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  No need for transfusion at this time.  Patient not on heparin -  Trend  DIET:  Healthy heart ACCESS: PIV IVF:  OFF PROPH:  SCDs  Code Status: Full code Family Communication: Spoke with patient and with daughter Wilkie Aye at [phone].   Disposition Plan: Pending improvement in respiratory status and blood pressure likely to home.  Will have PT eval as outpatient if needed.     Consultants:  None  Procedures:  CT head 10/13  CT head pending  ECHO 10/13  Antibiotics:  Ceftriaxone 10/13>>  Azithromycin 10/13>>  HPI/Subjective:  Patient states she continues to feel Javaria Knapke of breath this morning and has a lot of nonproductive cough, although she cannot cough anything up right now.  Denies nausea, vomiting, diarrhea.    Objective: Filed Vitals:   02/11/12 0200 02/11/12 0211 02/11/12 0245 02/11/12 0345  BP: 108/81  107/72 116/63  Pulse: 97  86   Temp:  98.2 F (36.8 C)  98.1 F (36.7 C)  TempSrc:  Oral  Oral  Resp: 19  16 16  Height:    5\' 5"  (1.651 m)  Weight:    72.167 kg (159 lb 1.6 oz)  SpO2: 96%  97% 95%    Intake/Output Summary (Last 24 hours) at 02/11/12 1230 Last data filed at 02/11/12 0211  Gross per 24 hour  Intake      0 ml  Output    300 ml  Net   -300 ml   Filed Weights   02/11/12 0345  Weight: 72.167 kg (159 lb 1.6 oz)    Exam:   General:  CF, moderate respiratory distress with SCM and suprasternal retractions on nasal canula  HEENT:   Mildly tachy MMM  Cardiovascular:  Irregularly  irregular, no m/r/g.    Respiratory: Rales at bilateral bases, frequent cough, diminished at bases, prolonged expiratory phase and wheeze   Abdomen: NABS, soft, nondistended, nontender  MSK:  Trace LEE, tender along medial tibias bilaterallly without erythema  Data Reviewed: Basic Metabolic Panel:  Lab 02/11/12 0454 02/10/12 2315  NA 135 135  K 4.2 5.6*  CL 101 101  CO2 24 22  GLUCOSE 108* 123*  BUN 34* 45*  CREATININE 0.81 1.14*  CALCIUM 10.1 11.2*  MG -- --  PHOS -- --   Liver Function Tests:  Lab 02/11/12 0941 02/10/12 2315  AST 20 24  ALT 12 14  ALKPHOS 74 97  BILITOT 0.6 0.5  PROT 6.4 7.5  ALBUMIN 3.9 4.6   No results found for this basename: LIPASE:5,AMYLASE:5 in the last 168 hours No results found for this basename: AMMONIA:5 in the last 168 hours CBC:  Lab 02/11/12 0941 02/10/12 2315  WBC 9.1 10.6*  NEUTROABS 6.7 9.3*  HGB 11.5* 12.7  HCT 34.8* 38.4  MCV 93.5 94.8  PLT 97* 109*   Cardiac Enzymes:  Lab 02/11/12 0941 02/10/12 2315  CKTOTAL -- --  CKMB -- --  CKMBINDEX -- --  TROPONINI <0.30 <0.30   BNP (last 3 results)  Basename 02/10/12 2315  PROBNP 2149.0*   CBG: No results found for this basename: GLUCAP:5 in the last 168 hours  No results found for this or any previous visit (from the past 240 hour(s)).   Studies: Ct Head Wo Contrast  02/11/2012  *RADIOLOGY REPORT*  Clinical Data: Altered mental status  CT HEAD WITHOUT CONTRAST  Technique:  Contiguous axial images were obtained from the base of the skull through the vertex without contrast.  Comparison: None.  Findings: Motion degraded images.  No evidence of parenchymal hemorrhage or extra-axial fluid collection. No mass lesion, mass effect, or midline shift.  No CT evidence of acute infarction.  Mild hyperdensity in the left basal ganglia, favored to reflect calcification given the lack of surrounding vasogenic edema.  Additional apparent hyperdensity in the left frontal lobe this likely  related to streak artifact (series 2/image 18).  Subcortical white matter and periventricular small vessel ischemic changes.  The visualized paranasal sinuses are essentially clear. The mastoid air cells are unopacified.  No evidence of calvarial fracture.  IMPRESSION: Motion degraded images.  No evidence of acute intracranial abnormality.  Mild hyperdensity in the left basal ganglia is favored to reflect benign calcification.  If there is continued clinical concern, consider a follow-up CT in 6 hours to document stability.   Original Report Authenticated By: Charline Bills, M.D.    Dg Chest Port 1 View  02/10/2012  *RADIOLOGY REPORT*  Clinical Data: Altered mental status.  Shortness of breath.  COPD. Prior myocardial infarction.  PORTABLE CHEST -  1 VIEW  Comparison: None.  Findings: The patient is rotated to the left on today's exam, resulting in reduced diagnostic sensitivity and specificity.   Dual lead pacer noted with cardiomegaly and atherosclerotic calcification of the aortic arch.  Airspace opacity noted in the left lower lobe, obscuring the left hemidiaphragm.  Linear atelectasis or scarring noted along the right hemidiaphragm.  IMPRESSION: 1.  Left lower lobe atelectasis or pneumonia.  2.  Cardiomegaly without overt edema.  3.  Subsegmental atelectasis or scarring along the right hemidiaphragm.  4.  Dual lead pacer in place.   Original Report Authenticated By: Dellia Cloud, M.D.     Scheduled Meds:   . albuterol  2.5 mg Nebulization Once  . ipratropium  0.5 mg Nebulization Q6H   And  . albuterol  2.5 mg Nebulization Q6H  . atorvastatin  20 mg Oral q1800  . azithromycin (ZITHROMAX) 500 MG IVPB  500 mg Intravenous Once  . azithromycin  500 mg Oral Q24H  . cefTRIAXone (ROCEPHIN)  IV  1 g Intravenous Once  . cefTRIAXone (ROCEPHIN)  IV  1 g Intravenous Q24H  . clonazePAM  0.5 mg Oral BID  . dextromethorphan-guaiFENesin  1 tablet Oral BID  . famotidine  10 mg Oral Daily  .  influenza  inactive virus vaccine  0.5 mL Intramuscular Tomorrow-1000  . ipratropium  0.5 mg Nebulization Once  . LORazepam  0.5 mg Oral Once  . methylPREDNISolone (SOLU-MEDROL) injection  60 mg Intravenous Q12H  . pantoprazole  40 mg Oral Q1200  . sodium chloride  3 mL Intravenous Q12H  . sodium polystyrene  30 g Oral Once   Continuous Infusions:   . DISCONTD: sodium chloride 50 mL/hr at 02/11/12 1610    Principal Problem:  *Community acquired pneumonia Active Problems:  Encephalopathy  Hypertension  Atrial fibrillation  Thrombocytopenia  Dehydration  Hypercalcemia  Hyperkalemia    Time spent: 45 minutes including talking with daughter to obtain additional history regarding PMHx and current problems leading to admission    Renae Fickle  [facility] Pager [phone]. If 8PM-8AM, please contact night-coverage at [email or web address], password [facility] 02/11/2012, 12:30 PM  LOS: 1 day

## 2012-02-12 DIAGNOSIS — R404 Transient alteration of awareness: Secondary | ICD-10-CM

## 2012-02-12 DIAGNOSIS — J9600 Acute respiratory failure, unspecified whether with hypoxia or hypercapnia: Secondary | ICD-10-CM

## 2012-02-12 DIAGNOSIS — R042 Hemoptysis: Secondary | ICD-10-CM

## 2012-02-12 DIAGNOSIS — D649 Anemia, unspecified: Secondary | ICD-10-CM

## 2012-02-12 DIAGNOSIS — I619 Nontraumatic intracerebral hemorrhage, unspecified: Secondary | ICD-10-CM

## 2012-02-12 LAB — BASIC METABOLIC PANEL
BUN: 43 mg/dL — ABNORMAL HIGH (ref 6–23)
Chloride: 98 mEq/L (ref 96–112)
Creatinine, Ser: 0.93 mg/dL (ref 0.50–1.10)
GFR calc Af Amer: 69 mL/min — ABNORMAL LOW (ref >90)
GFR calc non Af Amer: 60 mL/min — ABNORMAL LOW (ref >90)
Glucose, Bld: 144 mg/dL — ABNORMAL HIGH (ref 70–99)

## 2012-02-12 LAB — CBC
HCT: 36.4 % (ref 36.0–46.0)
Hemoglobin: 12.4 g/dL (ref 12.0–15.0)
MCH: 31.6 pg (ref 26.0–34.0)
MCHC: 34.1 g/dL (ref 30.0–36.0)
RDW: 13.2 % (ref 11.5–15.5)

## 2012-02-12 LAB — PROTIME-INR: Prothrombin Time: 21.3 seconds — ABNORMAL HIGH (ref 11.6–15.2)

## 2012-02-12 LAB — LEGIONELLA ANTIGEN, URINE

## 2012-02-12 LAB — URINE CULTURE

## 2012-02-12 MED ORDER — METHYLPREDNISOLONE SODIUM SUCC 125 MG IJ SOLR
60.0000 mg | Freq: Every day | INTRAMUSCULAR | Status: DC
  Administered 2012-02-13: 60 mg via INTRAVENOUS
  Filled 2012-02-12: qty 0.96

## 2012-02-12 MED ORDER — FUROSEMIDE 40 MG PO TABS
40.0000 mg | ORAL_TABLET | Freq: Every day | ORAL | Status: DC
  Administered 2012-02-12 – 2012-02-13 (×2): 40 mg via ORAL
  Filled 2012-02-12 (×2): qty 1

## 2012-02-12 MED ORDER — ASPIRIN EC 81 MG PO TBEC
81.0000 mg | DELAYED_RELEASE_TABLET | Freq: Every day | ORAL | Status: DC
  Administered 2012-02-13 – 2012-02-14 (×2): 81 mg via ORAL
  Filled 2012-02-12 (×3): qty 1

## 2012-02-12 MED ORDER — LATANOPROST 0.005 % OP SOLN
1.0000 [drp] | Freq: Every day | OPHTHALMIC | Status: DC
  Administered 2012-02-12 – 2012-02-13 (×2): 1 [drp] via OPHTHALMIC
  Filled 2012-02-12 (×2): qty 2.5

## 2012-02-12 MED ORDER — BENZONATATE 100 MG PO CAPS
100.0000 mg | ORAL_CAPSULE | Freq: Two times a day (BID) | ORAL | Status: DC
  Administered 2012-02-12 – 2012-02-14 (×4): 100 mg via ORAL
  Filled 2012-02-12 (×6): qty 1

## 2012-02-12 MED ORDER — DILTIAZEM HCL ER COATED BEADS 120 MG PO CP24
120.0000 mg | ORAL_CAPSULE | Freq: Every day | ORAL | Status: DC
  Administered 2012-02-12 – 2012-02-14 (×3): 120 mg via ORAL
  Filled 2012-02-12 (×3): qty 1

## 2012-02-12 MED ORDER — GABAPENTIN 600 MG PO TABS
300.0000 mg | ORAL_TABLET | Freq: Every day | ORAL | Status: DC
  Administered 2012-02-12 – 2012-02-14 (×3): 300 mg via ORAL
  Filled 2012-02-12 (×3): qty 0.5

## 2012-02-12 NOTE — Progress Notes (Signed)
Physical Therapy Evaluation Patient Details Name: Beth Greer MRN: [id number] DOB: 02-04-1941 Today's Date: 02/12/2012 Time: 7425-9563 PT Time Calculation (min): 40 min  PT Assessment / Plan / Recommendation Clinical Impression  Pt is 71 yo female with PNA and A-fib who is experiencing generalized weakness and increased HR which is currently limiting Beth mobility.  Recommend acute PT to help increase strength and activity to help pt return to mod I status for d/c home.  Pt will not likely need f/u PT after d/c.    PT Assessment  Patient needs continued PT services    Follow Up Recommendations  No PT follow up;Supervision - Intermittent    Does the patient have the potential to tolerate intense rehabilitation      Barriers to Discharge None      Equipment Recommendations  None recommended by PT    Recommendations for Other Services     Frequency Min 3X/week    Precautions / Restrictions Precautions Precautions: Fall Restrictions Weight Bearing Restrictions: No   Pertinent Vitals/Pain Supine BP 129/67 HR 95 bpm Sitting BP 128/76  HR 138 bpm Standing BP 128/102 HR 183 HR with ambulation 120s to 155 bpm,, nsg aware HR after ambulation 107 O2 sats 99% on 2L O2      Mobility  Bed Mobility Bed Mobility: Supine to Sit Supine to Sit: 5: Supervision;HOB elevated Details for Bed Mobility Assistance: pt able to get to side of bed with increased time but without physical assist Transfers Transfers: Sit to Stand;Stand to Sit Sit to Stand: From bed;From chair/3-in-1;With upper extremity assist;4: Min guard Stand to Sit: 4: Min guard;To chair/3-in-1;To bed;With upper extremity assist Details for Transfer Assistance: vc's for hand placement and to keep RW with Beth all the way to chair Ambulation/Gait Ambulation/Gait Assistance: 4: Min assist Ambulation Distance (Feet): 150 Feet Assistive device: Rolling walker Ambulation/Gait Assistance Details: pt ambulates with kyphotic  posture and HR up to 154 without symptoms, pt very driven to ambulate Gait Pattern: Step-through pattern;Trunk flexed Gait velocity: decreased Stairs: No Wheelchair Mobility Wheelchair Mobility: No    Shoulder Instructions     Exercises     PT Diagnosis: Generalized weakness  PT Problem List: Decreased strength;Decreased activity tolerance;Decreased mobility;Decreased cognition PT Treatment Interventions: DME instruction;Stair training;Gait training;Functional mobility training;Therapeutic activities;Therapeutic exercise;Patient/family education   PT Goals Acute Rehab PT Goals PT Goal Formulation: With patient Time For Goal Achievement: 02/26/12 Potential to Achieve Goals: Good Pt will go Supine/Side to Sit: with modified independence PT Goal: Supine/Side to Sit - Progress: Goal set today Pt will go Sit to Supine/Side: with modified independence PT Goal: Sit to Supine/Side - Progress: Goal set today Pt will go Sit to Stand: with modified independence PT Goal: Sit to Stand - Progress: Goal set today Pt will go Stand to Sit: with modified independence PT Goal: Stand to Sit - Progress: Goal set today Pt will Ambulate: >150 feet;with modified independence;with least restrictive assistive device PT Goal: Ambulate - Progress: Goal set today Pt will Go Up / Down Stairs: 1-2 stairs;with supervision;with rail(s) PT Goal: Up/Down Stairs - Progress: Goal set today  Visit Information  Last PT Received On: 02/12/12 Assistance Needed: +2 (to bring chair due to increased HR)    Subjective Data  Subjective: I feel so bad because my Greer feels like it is Beth fault that I am sick Patient Stated Goal: return home   Prior Functioning  Home Living Lives With: Greer Available Help at Discharge: Family Type of Home: House Home  Access: Stairs to enter [employer] of Steps: 2 Home Layout: One level Home Adaptive Equipment: Walker - rolling;Straight cane Additional Comments:  Pt lives in [place], [place] with Beth Greer who works full-time.  She drove herself down here to take care of Beth other Greer who was having THA when she became sick.  She is unsure of whether she will go straight home or stay here with Beth Greer until she feels better. Prior Function Level of Independence: Independent with assistive device(s) Able to Take Stairs?: Yes Driving: Yes Vocation: Retired Musician: No difficulties    Cognition  Overall Cognitive Status: Impaired Area of Impairment: Memory Arousal/Alertness: Awake/alert Orientation Level: Appears intact for tasks assessed Behavior During Session: WFL for tasks performed Memory Deficits: mild memory issues surrounding stay here and timeline    Extremity/Trunk Assessment Right Upper Extremity Assessment RUE ROM/Strength/Tone: [facility] for tasks assessed Left Upper Extremity Assessment LUE ROM/Strength/Tone: WFL for tasks assessed Right Lower Extremity Assessment RLE ROM/Strength/Tone: Deficits RLE ROM/Strength/Tone Deficits: grossly 4/5 throughout but pt began having bilateral LE tremor in standing and reported that this happens at home and that she has some "mild Parkinsons" but they can't treat Beth because of Beth comorbidities RLE Sensation: WFL - Light Touch RLE Coordination: WFL - gross motor Left Lower Extremity Assessment LLE ROM/Strength/Tone: Deficits LLE ROM/Strength/Tone Deficits: same as right LLE Sensation: WFL - Light Touch LLE Coordination: WFL - gross motor Trunk Assessment Trunk Assessment: Kyphotic   Balance Balance Balance Assessed: Yes Dynamic Standing Balance Dynamic Standing - Balance Support: Left upper extremity supported;During functional activity Dynamic Standing - Level of Assistance: 5: Stand by assistance  End of Session PT - End of Session Equipment Utilized During Treatment: Gait belt;Oxygen Activity Tolerance: Patient tolerated treatment well;Other (comment) (limited by  increased HR) Patient left: in chair;with call bell/phone within reach;with chair alarm set Nurse Communication: Mobility status;Other (comment) (HR)  GP   Lyanne Co, PT  Acute Rehab Services  [phone]   Lyanne Co 02/12/2012, 4:08 PM

## 2012-02-12 NOTE — Consult Note (Signed)
[facility] STROKE CONSULT NOTE       Chief Complaint: AMS with possible BG hemorrhage.    HPI:    Beth Greer is an 71 y.o. female Who presented to the ED due to worsening SOB and confusion.  Patient has history brain aneurysm (patient is unsure where but was option choice to have surgical intervention and decided not to), HTN, Afib with dual chamber pacemaker with recent EKG showing A-fib, on chronic coumadin, recent INR 2.42, and recent CXR showing possible PNA (cultures pending). CT head X2 showed 5mm hyperdensity in the left basal ganglia with differential of hemorrhage, cavernous angioma or AVM. Marland Kitchen MRI unable to be obtained secondary to Pacer. Patient has had multiple CT scans in [place] but is unable to tell me if they have noted a hyper density in the past.  I have tried to contact daughter but was unable. In discussion with patient, he main complaint at this time is SOB and chest discomfort from coughing.  She has not noted any right sided deficits.  Her previous stroke which was thought to occur in 2002 left her with "left tingling in her fingers".    LSN: 02-10-12 tPA Given: No: bleed    Past Medical History  Diagnosis Date  . Arthritis     DJD  . COPD (chronic obstructive pulmonary disease)   . Cancer     skin cancer  . Pacemaker 2002  . A-fib     coumadin  . Hypertension   . Stroke   . MI (myocardial infarction)   . Brain aneurysm   . Glaucoma   . CHF (congestive heart failure)   . Diaphragm paralysis   . Osteoarthritis of back     Past Surgical History  Procedure Date  . Total knee arthroplasty     bilaterally  . Eye surgery     catarcts    History reviewed. No pertinent family history. Social History:  reports that she has never smoked. She does not have any smokeless tobacco history on file. She reports that she does not drink alcohol. Her drug history not on file.  Allergies:  Allergies  Allergen Reactions  . Codeine       unknown  . Morphine And Related     unknown  . Sulfa Antibiotics     unknown    Medications:    Prior to Admission:  Prescriptions prior to admission  Medication Sig Dispense Refill  . albuterol (PROVENTIL HFA;VENTOLIN HFA) 108 (90 BASE) MCG/ACT inhaler Inhale 2 puffs into the lungs every 6 (six) hours as needed. For shortness of breath      . atenolol (TENORMIN) 100 MG tablet Take 100 mg by mouth daily.      Marland Kitchen CALCIUM CARBONATE PO Take 1 tablet by mouth daily.      . clonazePAM (KLONOPIN) 0.5 MG tablet Take 0.5 mg by mouth 2 (two) times daily as needed. For anxiety      . diltiazem (TIAZAC) 240 MG 24 hr capsule Take 240 mg by mouth daily.      . furosemide (LASIX) 40 MG tablet Take 40 mg by mouth daily.      Marland Kitchen HYDROcodone-acetaminophen (NORCO) 10-325 MG per tablet Take 1 tablet by mouth 3 (three) times daily as needed. For pain      . omeprazole (PRILOSEC) 20 MG capsule Take 20 mg by mouth daily.      Marland Kitchen  potassium chloride SA (K-DUR,KLOR-CON) 20 MEQ tablet Take 40 mEq by mouth daily.      . ranitidine (ZANTAC) 150 MG tablet Take 150 mg by mouth 2 (two) times daily.      . rosuvastatin (CRESTOR) 10 MG tablet Take 10 mg by mouth at bedtime.      Marland Kitchen spironolactone (ALDACTONE) 50 MG tablet Take 50 mg by mouth daily.      . valsartan (DIOVAN) 80 MG tablet Take 80 mg by mouth daily.      Marland Kitchen warfarin (COUMADIN) 5 MG tablet Take 2.5 mg by mouth daily.       Scheduled:   . ipratropium  0.5 mg Nebulization Q6H   And  . albuterol  2.5 mg Nebulization Q6H  . atorvastatin  20 mg Oral q1800  . azithromycin  500 mg Oral Q24H  . cefTRIAXone (ROCEPHIN)  IV  1 g Intravenous Q24H  . clonazePAM  0.5 mg Oral BID  . dextromethorphan-guaiFENesin  1 tablet Oral BID  . famotidine  10 mg Oral Daily  . influenza  inactive virus vaccine  0.5 mL Intramuscular Tomorrow-1000  . methylPREDNISolone (SOLU-MEDROL) injection  60 mg Intravenous Q12H  . pantoprazole  40 mg Oral Q1200  . sodium chloride  3 mL  Intravenous Q12H    ROS: History obtained from the patient  General ROS: negative for - chills, fatigue, fever, night sweats, weight gain or weight loss Psychological ROS: negative for - behavioral disorder, hallucinations, memory difficulties, mood swings or suicidal ideation Ophthalmic ROS: negative for - blurry vision, double vision, eye pain or loss of vision ENT ROS: negative for - epistaxis, nasal discharge, oral lesions, sore throat, tinnitus or vertigo Allergy and Immunology ROS: negative for - hives or itchy/watery eyes Hematological and Lymphatic ROS: negative for - bleeding problems, bruising or swollen lymph nodes Endocrine ROS: negative for - galactorrhea, hair pattern changes, polydipsia/polyuria or temperature intolerance Respiratory ROS: positive for - cough, shortness of breath or wheezing Cardiovascular ROS: negative for - chest pain, dyspnea on exertion, edema or irregular heartbeat Gastrointestinal ROS: negative for - abdominal pain, diarrhea, hematemesis, nausea/vomiting or stool incontinence Genito-Urinary ROS: negative for - dysuria, hematuria, incontinence or urinary frequency/urgency Musculoskeletal ROS: positive for -  muscular weakness Neurological ROS: as noted in HPI Dermatological ROS: negative for rash and skin lesion changes   Physical Examination: Blood pressure 104/68, pulse 115, temperature 97.7 F (36.5 C), temperature source Oral, resp. rate 18, height 5\' 5"  (1.651 m), weight 72.167 kg (159 lb 1.6 oz), SpO2 97.00%.  Neurologic Examination:  Mental Status: Alert, oriented month year hospital (does not know name of hospital) and president. Thought content appropriate.  Speech fluent without evidence of aphasia.  Able to follow 3 step commands without difficulty. Cranial Nerves: II: Discs flat bilaterally; Visual fields grossly normal, pupils equal, round, reactive to light and accommodation III,IV, VI: ptosis not present, extra-ocular motions intact  bilaterally V,VII: smile symmetric, facial light touch sensation normal bilaterally VIII: hearing normal bilaterally IX,X: gag reflex present XI: bilateral shoulder shrug XII: midline tongue extension Motor: Right : Upper extremity   4/5    Left:     Upper extremity   4/5  Lower extremity   4/5     Lower extremity   4/5 --positive pronator drift in left UE --Resting and positional tremor in left hand that is exacerbated when patient is thinking or answering questions.  Positional tremor bilaterally when hands are held out stretched.  Tone and bulk:normal tone throughout;  no atrophy noted Sensory: Pinprick and light touch intact throughout, bilaterally with exception of left fingers having decreased sensation Deep Tendon Reflexes: 2+ and symmetric throughout Plantars: Right: downgoing   Left: upgoing Cerebellar: normal finger-to-nose,  normal heel-to-shin test CV: pulses palpable throughout     No results found for this basename: cbc, bmp, coags, chol, tri, ldl, hga1c    Results for orders placed during the hospital encounter of 02/10/12 (from the past 48 hour(s))  PRO B NATRIURETIC PEPTIDE     Status: Abnormal   Collection Time   02/10/12 11:15 PM      Component Value Range Comment   Pro B Natriuretic peptide (BNP) 2149.0 (*) 0 - 125 pg/mL   COMPREHENSIVE METABOLIC PANEL     Status: Abnormal   Collection Time   02/10/12 11:15 PM      Component Value Range Comment   Sodium 135  135 - 145 mEq/L    Potassium 5.6 (*) 3.5 - 5.1 mEq/L    Chloride 101  96 - 112 mEq/L    CO2 22  19 - 32 mEq/L    Glucose, Bld 123 (*) 70 - 99 mg/dL    BUN 45 (*) 6 - 23 mg/dL    Creatinine, Ser 1.61 (*) 0.50 - 1.10 mg/dL    Calcium 09.6 (*) 8.4 - 10.5 mg/dL    Total Protein 7.5  6.0 - 8.3 g/dL    Albumin 4.6  3.5 - 5.2 g/dL    AST 24  0 - 37 U/L    ALT 14  0 - 35 U/L    Alkaline Phosphatase 97  39 - 117 U/L    Total Bilirubin 0.5  0.3 - 1.2 mg/dL    GFR calc non Af Amer 47 (*) >90 mL/min    GFR  calc Af Amer 54 (*) >90 mL/min   CBC WITH DIFFERENTIAL     Status: Abnormal   Collection Time   02/10/12 11:15 PM      Component Value Range Comment   WBC 10.6 (*) 4.0 - 10.5 K/uL    RBC 4.05  3.87 - 5.11 MIL/uL    Hemoglobin 12.7  12.0 - 15.0 g/dL    HCT 04.5  40.9 - 81.1 %    MCV 94.8  78.0 - 100.0 fL    MCH 31.4  26.0 - 34.0 pg    MCHC 33.1  30.0 - 36.0 g/dL    RDW 91.4  78.2 - 95.6 %    Platelets 109 (*) 150 - 400 K/uL PLATELET COUNT CONFIRMED BY SMEAR   Neutrophils Relative 88 (*) 43 - 77 %    Neutro Abs 9.3 (*) 1.7 - 7.7 K/uL    Lymphocytes Relative 6 (*) 12 - 46 %    Lymphs Abs 0.6 (*) 0.7 - 4.0 K/uL    Monocytes Relative 6  3 - 12 %    Monocytes Absolute 0.6  0.1 - 1.0 K/uL    Eosinophils Relative 0  0 - 5 %    Eosinophils Absolute 0.0  0.0 - 0.7 K/uL    Basophils Relative 0  0 - 1 %    Basophils Absolute 0.0  0.0 - 0.1 K/uL    RBC Morphology BURR CELLS     TROPONIN I     Status: Normal   Collection Time   02/10/12 11:15 PM      Component Value Range Comment   Troponin I <0.30  <0.30 ng/mL   LACTIC ACID, PLASMA  Status: Normal   Collection Time   02/10/12 11:39 PM      Component Value Range Comment   Lactic Acid, Venous 1.2  0.5 - 2.2 mmol/L   CULTURE, BLOOD (ROUTINE X 2)     Status: Normal (Preliminary result)   Collection Time   02/10/12 11:40 PM      Component Value Range Comment   Specimen Description BLOOD LEFT LOWER FOREARM      Special Requests BOTTLES DRAWN AEROBIC ONLY 6CC      Culture  Setup Time 02/11/2012 14:18      Culture        Value:        BLOOD CULTURE RECEIVED NO GROWTH TO DATE CULTURE WILL BE HELD FOR 5 DAYS BEFORE ISSUING A FINAL NEGATIVE REPORT   Report Status PENDING     CULTURE, BLOOD (ROUTINE X 2)     Status: Normal (Preliminary result)   Collection Time   02/10/12 11:45 PM      Component Value Range Comment   Specimen Description BLOOD RIGHT ARM      Special Requests BOTTLES DRAWN AEROBIC AND ANAEROBIC 5CC EACH      Culture   Setup Time 02/11/2012 14:18      Culture        Value:        BLOOD CULTURE RECEIVED NO GROWTH TO DATE CULTURE WILL BE HELD FOR 5 DAYS BEFORE ISSUING A FINAL NEGATIVE REPORT   Report Status PENDING     URINALYSIS, ROUTINE W REFLEX MICROSCOPIC     Status: Abnormal   Collection Time   02/11/12 12:00 AM      Component Value Range Comment   Color, Urine YELLOW  YELLOW    APPearance CLEAR  CLEAR    Specific Gravity, Urine 1.017  1.005 - 1.030    pH 5.0  5.0 - 8.0    Glucose, UA NEGATIVE  NEGATIVE mg/dL    Hgb urine dipstick NEGATIVE  NEGATIVE    Bilirubin Urine NEGATIVE  NEGATIVE    Ketones, ur NEGATIVE  NEGATIVE mg/dL    Protein, ur NEGATIVE  NEGATIVE mg/dL    Urobilinogen, UA 0.2  0.0 - 1.0 mg/dL    Nitrite NEGATIVE  NEGATIVE    Leukocytes, UA SMALL (*) NEGATIVE   URINE MICROSCOPIC-ADD ON     Status: Abnormal   Collection Time   02/11/12 12:00 AM      Component Value Range Comment   Squamous Epithelial / LPF FEW (*) RARE    WBC, UA 0-2  <3 WBC/hpf    RBC / HPF 0-2  <3 RBC/hpf    Bacteria, UA FEW (*) RARE   PROTIME-INR     Status: Abnormal   Collection Time   02/11/12 12:09 AM      Component Value Range Comment   Prothrombin Time 25.2 (*) 11.6 - 15.2 seconds    INR 2.42 (*) 0.00 - 1.49   LEGIONELLA ANTIGEN, URINE     Status: Normal   Collection Time   02/11/12  5:40 AM      Component Value Range Comment   Specimen Description URINE, RANDOM      Special Requests NONE      Legionella Antigen, Urine Negative for Legionella pneumophilia serogroup 1      Report Status 02/12/2012 FINAL     STREP PNEUMONIAE URINARY ANTIGEN     Status: Normal   Collection Time   02/11/12  5:40 AM      Component  Value Range Comment   Strep Pneumo Urinary Antigen NEGATIVE  NEGATIVE   COMPREHENSIVE METABOLIC PANEL     Status: Abnormal   Collection Time   02/11/12  9:41 AM      Component Value Range Comment   Sodium 135  135 - 145 mEq/L    Potassium 4.2  3.5 - 5.1 mEq/L    Chloride 101  96 - 112  mEq/L    CO2 24  19 - 32 mEq/L    Glucose, Bld 108 (*) 70 - 99 mg/dL    BUN 34 (*) 6 - 23 mg/dL    Creatinine, Ser 1.61  0.50 - 1.10 mg/dL    Calcium 09.6  8.4 - 10.5 mg/dL    Total Protein 6.4  6.0 - 8.3 g/dL    Albumin 3.9  3.5 - 5.2 g/dL    AST 20  0 - 37 U/L    ALT 12  0 - 35 U/L    Alkaline Phosphatase 74  39 - 117 U/L    Total Bilirubin 0.6  0.3 - 1.2 mg/dL    GFR calc non Af Amer 70 (*) >90 mL/min    GFR calc Af Amer 82 (*) >90 mL/min   CBC WITH DIFFERENTIAL     Status: Abnormal   Collection Time   02/11/12  9:41 AM      Component Value Range Comment   WBC 9.1  4.0 - 10.5 K/uL    RBC 3.72 (*) 3.87 - 5.11 MIL/uL    Hemoglobin 11.5 (*) 12.0 - 15.0 g/dL    HCT 04.5 (*) 40.9 - 46.0 %    MCV 93.5  78.0 - 100.0 fL    MCH 30.9  26.0 - 34.0 pg    MCHC 33.0  30.0 - 36.0 g/dL    RDW 81.1  91.4 - 78.2 %    Platelets 97 (*) 150 - 400 K/uL CONSISTENT WITH PREVIOUS RESULT   Neutrophils Relative 74  43 - 77 %    Neutro Abs 6.7  1.7 - 7.7 K/uL    Lymphocytes Relative 15  12 - 46 %    Lymphs Abs 1.4  0.7 - 4.0 K/uL    Monocytes Relative 11  3 - 12 %    Monocytes Absolute 1.0  0.1 - 1.0 K/uL    Eosinophils Relative 0  0 - 5 %    Eosinophils Absolute 0.0  0.0 - 0.7 K/uL    Basophils Relative 0  0 - 1 %    Basophils Absolute 0.0  0.0 - 0.1 K/uL   CALCIUM, IONIZED     Status: Abnormal   Collection Time   02/11/12  9:41 AM      Component Value Range Comment   Calcium, Ion 1.33 (*) 1.12 - 1.32 mmol/L   TROPONIN I     Status: Normal   Collection Time   02/11/12  9:41 AM      Component Value Range Comment   Troponin I <0.30  <0.30 ng/mL   CULTURE, EXPECTORATED SPUTUM-ASSESSMENT     Status: Normal   Collection Time   02/11/12  1:37 PM      Component Value Range Comment   Specimen Description SPUTUM      Special Requests Normal      Sputum evaluation        Value: THIS SPECIMEN IS ACCEPTABLE. RESPIRATORY CULTURE REPORT TO FOLLOW.   Report Status 02/11/2012 FINAL     CULTURE,  RESPIRATORY  Status: Normal (Preliminary result)   Collection Time   02/11/12  1:37 PM      Component Value Range Comment   Specimen Description SPUTUM      Special Requests NONE      Gram Stain PENDING      Culture Culture reincubated for better growth      Report Status PENDING     TROPONIN I     Status: Normal   Collection Time   02/11/12  4:24 PM      Component Value Range Comment   Troponin I <0.30  <0.30 ng/mL     Ct Head Wo Contrast  02/11/2012  *RADIOLOGY REPORT*  Clinical Data: Basal ganglia lesion  CT HEAD WITHOUT CONTRAST  Technique:  Contiguous axial images were obtained from the base of the skull through the vertex without contrast.  Comparison: 300 hours  Findings: The 5 mm round lesion in the left attainment associated with hyper densities is stable.  There is no surrounding vasogenic edema.  Chronic ischemic changes in the periventricular white matter and mild global atrophy are superimposed.  IMPRESSION: Stable 5 mm lesion in the left basal ganglia.  Differential diagnosis includes small focus of hemorrhage, cavernous angioma, a small vascular aneurysm.  MRI can be performed to further delineate.   Original Report Authenticated By: Donavan Burnet, M.D.    Ct Head Wo Contrast  02/11/2012  *RADIOLOGY REPORT*  Clinical Data: Altered mental status  CT HEAD WITHOUT CONTRAST  Technique:  Contiguous axial images were obtained from the base of the skull through the vertex without contrast.  Comparison: None.  Findings: Motion degraded images.  No evidence of parenchymal hemorrhage or extra-axial fluid collection. No mass lesion, mass effect, or midline shift.  No CT evidence of acute infarction.  Mild hyperdensity in the left basal ganglia, favored to reflect calcification given the lack of surrounding vasogenic edema.  Additional apparent hyperdensity in the left frontal lobe this likely related to streak artifact (series 2/image 18).  Subcortical white matter and periventricular  small vessel ischemic changes.  The visualized paranasal sinuses are essentially clear. The mastoid air cells are unopacified.  No evidence of calvarial fracture.  IMPRESSION: Motion degraded images.  No evidence of acute intracranial abnormality.  Mild hyperdensity in the left basal ganglia is favored to reflect benign calcification.  If there is continued clinical concern, consider a follow-up CT in 6 hours to document stability.   Original Report Authenticated By: Charline Bills, M.D.    Dg Chest Port 1 View  02/10/2012  *RADIOLOGY REPORT*  Clinical Data: Altered mental status.  Shortness of breath.  COPD. Prior myocardial infarction.  PORTABLE CHEST - 1 VIEW  Comparison: None.  Findings: The patient is rotated to the left on today's exam, resulting in reduced diagnostic sensitivity and specificity.   Dual lead pacer noted with cardiomegaly and atherosclerotic calcification of the aortic arch.  Airspace opacity noted in the left lower lobe, obscuring the left hemidiaphragm.  Linear atelectasis or scarring noted along the right hemidiaphragm.  IMPRESSION: 1.  Left lower lobe atelectasis or pneumonia.  2.  Cardiomegaly without overt edema.  3.  Subsegmental atelectasis or scarring along the right hemidiaphragm.  4.  Dual lead pacer in place.   Original Report Authenticated By: Dellia Cloud, M.D.    2 D echo Study Conclusions  - Left ventricle: The cavity size was normal. There was mild concentric hypertrophy. Systolic function was normal. The estimated ejection fraction was in the range  of 55% to 60%. There is akinesis of the apical myocardium. Doppler parameters are consistent with abnormal left ventricular relaxation (grade 1 diastolic dysfunction). - Aortic valve: Mild regurgitation. - Aorta: The aorta was moderately dilated and mildly calcified. - Mitral valve: Mild regurgitation. - Left atrium: The atrium was mildly dilated. - Right atrium: There is a calcified density within the  mid right atrium of undetermined significance. Possible catheter/ pacemaker lead. The atrium was mildly dilated. - Tricuspid valve: Moderate regurgitation. - Pulmonary arteries: Systolic pressure was mildly increased. PA peak pressure: 41mm Hg (S).   EKG A-fib  Assessment:    71 y.o. female with Afib on chronic coumadin (most recent INR 2.42) with hyperdensity visualized in left BG on CT scan of head.  On exam patient shows no right sided deficits.  Unfortunately there are no previous CT scans of the brain for comparison.  Exam is not acute and there have been no incidences of elevated BP, making a hemorrhage less likely, but with the patient on Coumadin a spontaneous hemorrhage is a possibility.     Stroke Risk Factors - atrial fibrillation, hypertension and CVA  Recommend: 1) Hold coumadin for now.  Restart ASA 325 mg daily.  2) Repeat CT head in 2-3 days 3) Frequent neuro checks with repeat head CT if neurological status changes.  4) Continue to control BP     5) Obtain records from hospital in [place]-release to be signed.     Stroke team to follow in AM  Felicie Morn PA-C [facility] [phone]  02/12/2012, 11:25 AM    Patient seen and examined.  Clinical course and management discussed.  Necessary edits performed.  I agree with the above.  Thana Farr, MD [facility] [phone]  02/12/2012  6:04 PM

## 2012-02-12 NOTE — Progress Notes (Signed)
RN faxed signed copy of medical release to [facility] in [place], [place] (fax # [phone])).  Nolon Nations

## 2012-02-12 NOTE — Progress Notes (Signed)
[facility] PROGRESS NOTE  Beth Greer WUJ:[id number] DOB: [birth year] DOA: 02/10/2012 PCP: Provider Not In System  Assessment/Plan:  Patient Active Problem List  Diagnosis  . Community acquired pneumonia  . Encephalopathy  . Hypertension  . Atrial fibrillation  . Thrombocytopenia  . Dehydration  . Acute exacerbation of chronic obstructive pulmonary disease (COPD)    Confusion/AMS:  Resolved.  Was likely due to PNA/COPD exacerbation that led to dehydration from poor PO and continued use of potassium/lasix.  She also takes narcotics and may have had some lingering effects in the setting of dehydration-related AKI.    CT head showed possible calcification versus small hemorrhagic stroke in basal ganglion.  Patient has known small 3mm aneurysm per history.  Her repeat head CT demonstrated a stable 5mm "lesion" in the basal ganglion and suggested it may be a "small focus of hemorrhage, cavernous angioma, or a small vascular aneurysm."   -  Consider CT angio brain but will await neurology recommendations -  Neurology consultation -  Continue to hold warfarin pending neurology recommendations  Community acquired pneumonia:  Patient presented with possible left lower lobe pneumonia versus atelectasis and increased work of breathing.  No fevers or leukocytosis.  She is coughing up large green and bloody mucous plugs.  Most likely she has some atelectasis and plugging.  She is still oxygen dependent. -  Strep pneumo ag neg -  Ceftriaxone, day 2 -  Azithromycin, day 2 -  Continue mucinex DM, add tessalon  -  F/u sputum culture  Acute COPD exacerbation:  Patient uses home oxygen.  Per family, she has had multiple hospitalizations for COPD, but never intubated, never in the ICU.   -  Continue solumedrol -  Duonebs q6h -  Alb q2h prn -  Continue abx as above  Dehydration with hyperkalemia and hypercalcemia and mildly elevated BUN:Cr:  Improved after gentle hydration overnight.   Patient has history of CHF and is on lasix, potassium, tums at home.   - Restart lasix  CHF/atrial fibrillation/HTN/HLD:  More hydrated appearing today, but is starting to eat and drink.  Telemetry demonstrates atrial fibrillation with RVR to 120s intermittently but more consistently than yesterday.  ECG are stable with a-fib and T wave inversions in inferior leads and in V3-V6.  ECHO demonstrates normal ejection fraction with akinesis of the apical myocardium and grade 1 diastolic dysfunction.  Blood pressure still in low end of normal range, but heart rate rising.     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  Continue clonazepam to avoid withdrawal symptoms  Normocytic anemia/thrombocytopenia:  May be related to acute infectious process.  No need for transfusion at this time.  Patient not on heparin. -  Trend  DIET:  Healthy heart ACCESS: PIV IVF:  OFF PROPH:  SCDs  Code Status: Full code Family Communication: Spoke with patient and with daughter Wilkie Aye at [phone].   Disposition Plan:  PT evaluation, neurology consultation, improvement in respiratory distress and heart rate likely to home.     Consultants:  None  Procedures:  CT head 10/13  CT head 10/13  ECHO 10/13  Antibiotics:  Ceftriaxone 10/13>>  Azithromycin 10/13>>  HPI/Subjective:  Patient states she continues to have a severe cough  and is coughing up blood and green mucous plugs.  She feels generally weak and tired and her chest is sore from coughing so much.   Objective: Filed Vitals:   02/12/12 0458 02/12/12 0800 02/12/12 0859 02/12/12 0907  BP: 128/62 104/68    Pulse: 110 115    Temp: 97.3 F (36.3 C) 97.7 F (36.5 C)    TempSrc:      Resp: 16 18      Height:      Weight:      SpO2: 94% 93% 98% 97%    Intake/Output Summary (Last 24 hours) at 02/12/12 1127 Last data filed at 02/12/12 0900  Gross per 24 hour  Intake    240 ml  Output    600 ml  Net   -360 ml   Filed Weights   02/11/12 0345  Weight: 72.167 kg (159 lb 1.6 oz)    Exam:   General:  CF, decreased respiratory distress but with continuous cough productive of green sputum and oxygen dependent  HEENT:   MMM  Cardiovascular:  Irregularly irregular, tachycardic, no m/r/g.    Respiratory: Rales at bilateral bases, frequent cough, diminished at bases, prolonged expiratory phase and wheeze   Abdomen: NABS, soft, nondistended, nontender  MSK:  Trace LEE, tender along medial tibias bilaterallly without erythema  Data Reviewed: Basic Metabolic Panel:  Lab 02/11/12 1610 02/10/12 2315  NA 135 135  K 4.2 5.6*  CL 101 101  CO2 24 22  GLUCOSE 108* 123*  BUN 34* 45*  CREATININE 0.81 1.14*  CALCIUM 10.1 11.2*  MG -- --  PHOS -- --   Liver Function Tests:  Lab 02/11/12 0941 02/10/12 2315  AST 20 24  ALT 12 14  ALKPHOS 74 97  BILITOT 0.6 0.5  PROT 6.4 7.5  ALBUMIN 3.9 4.6   No results found for this basename: LIPASE:5,AMYLASE:5 in the last 168 hours No results found for this basename: AMMONIA:5 in the last 168 hours CBC:  Lab 02/11/12 0941 02/10/12 2315  WBC 9.1 10.6*  NEUTROABS 6.7 9.3*  HGB 11.5* 12.7  HCT 34.8* 38.4  MCV 93.5 94.8  PLT 97* 109*   Cardiac Enzymes:  Lab 02/11/12 1624 02/11/12 0941 02/10/12 2315  CKTOTAL -- -- --  CKMB -- -- --  CKMBINDEX -- -- --  TROPONINI <0.30 <0.30 <0.30   BNP (last 3 results)  Basename 02/10/12 2315  PROBNP 2149.0*   CBG: No results found for this basename: GLUCAP:5 in the last 168 hours  Recent Results (from the past 240 hour(s))  CULTURE, BLOOD (ROUTINE X 2)     Status: Normal (Preliminary result)   Collection Time   02/10/12 11:40 PM      Component Value Range Status Comment   Specimen  Description BLOOD LEFT LOWER FOREARM   Final    Special Requests BOTTLES DRAWN AEROBIC ONLY 6CC   Final    Culture  Setup Time 02/11/2012 14:18   Final    Culture     Final    Value:        BLOOD CULTURE RECEIVED NO GROWTH TO DATE CULTURE WILL BE HELD FOR 5 DAYS BEFORE ISSUING A FINAL NEGATIVE REPORT   Report Status PENDING   Incomplete   CULTURE, BLOOD (ROUTINE X 2)     Status: Normal (Preliminary result)   Collection Time   02/10/12 11:45 PM      Component Value Range Status Comment   Specimen Description BLOOD RIGHT ARM  Final    Special Requests BOTTLES DRAWN AEROBIC AND ANAEROBIC 5CC EACH   Final    Culture  Setup Time 02/11/2012 14:18   Final    Culture     Final    Value:        BLOOD CULTURE RECEIVED NO GROWTH TO DATE CULTURE WILL BE HELD FOR 5 DAYS BEFORE ISSUING A FINAL NEGATIVE REPORT   Report Status PENDING   Incomplete   CULTURE, EXPECTORATED SPUTUM-ASSESSMENT     Status: Normal   Collection Time   02/11/12  1:37 PM      Component Value Range Status Comment   Specimen Description SPUTUM   Final    Special Requests Normal   Final    Sputum evaluation     Final    Value: THIS SPECIMEN IS ACCEPTABLE. RESPIRATORY CULTURE REPORT TO FOLLOW.   Report Status 02/11/2012 FINAL   Final   CULTURE, RESPIRATORY     Status: Normal (Preliminary result)   Collection Time   02/11/12  1:37 PM      Component Value Range Status Comment   Specimen Description SPUTUM   Final    Special Requests NONE   Final    Gram Stain PENDING   Incomplete    Culture Culture reincubated for better growth   Final    Report Status PENDING   Incomplete      Studies: Ct Head Wo Contrast  02/11/2012  *RADIOLOGY REPORT*  Clinical Data: Basal ganglia lesion  CT HEAD WITHOUT CONTRAST  Technique:  Contiguous axial images were obtained from the base of the skull through the vertex without contrast.  Comparison: 300 hours  Findings: The 5 mm round lesion in the left attainment associated with hyper densities is  stable.  There is no surrounding vasogenic edema.  Chronic ischemic changes in the periventricular white matter and mild global atrophy are superimposed.  IMPRESSION: Stable 5 mm lesion in the left basal ganglia.  Differential diagnosis includes small focus of hemorrhage, cavernous angioma, a small vascular aneurysm.  MRI can be performed to further delineate.   Original Report Authenticated By: Donavan Burnet, M.D.    Ct Head Wo Contrast  02/11/2012  *RADIOLOGY REPORT*  Clinical Data: Altered mental status  CT HEAD WITHOUT CONTRAST  Technique:  Contiguous axial images were obtained from the base of the skull through the vertex without contrast.  Comparison: None.  Findings: Motion degraded images.  No evidence of parenchymal hemorrhage or extra-axial fluid collection. No mass lesion, mass effect, or midline shift.  No CT evidence of acute infarction.  Mild hyperdensity in the left basal ganglia, favored to reflect calcification given the lack of surrounding vasogenic edema.  Additional apparent hyperdensity in the left frontal lobe this likely related to streak artifact (series 2/image 18).  Subcortical white matter and periventricular small vessel ischemic changes.  The visualized paranasal sinuses are essentially clear. The mastoid air cells are unopacified.  No evidence of calvarial fracture.  IMPRESSION: Motion degraded images.  No evidence of acute intracranial abnormality.  Mild hyperdensity in the left basal ganglia is favored to reflect benign calcification.  If there is continued clinical concern, consider a follow-up CT in 6 hours to document stability.   Original Report Authenticated By: Charline Bills, M.D.    Dg Chest Port 1 View  02/10/2012  *RADIOLOGY REPORT*  Clinical Data: Altered mental status.  Shortness of breath.  COPD. Prior myocardial infarction.  PORTABLE CHEST - 1 VIEW  Comparison: None.  Findings:  The patient is rotated to the left on today's exam, resulting in reduced diagnostic  sensitivity and specificity.   Dual lead pacer noted with cardiomegaly and atherosclerotic calcification of the aortic arch.  Airspace opacity noted in the left lower lobe, obscuring the left hemidiaphragm.  Linear atelectasis or scarring noted along the right hemidiaphragm.  IMPRESSION: 1.  Left lower lobe atelectasis or pneumonia.  2.  Cardiomegaly without overt edema.  3.  Subsegmental atelectasis or scarring along the right hemidiaphragm.  4.  Dual lead pacer in place.   Original Report Authenticated By: Dellia Cloud, M.D.     Scheduled Meds:    . ipratropium  0.5 mg Nebulization Q6H   And  . albuterol  2.5 mg Nebulization Q6H  . atorvastatin  20 mg Oral q1800  . azithromycin  500 mg Oral Q24H  . cefTRIAXone (ROCEPHIN)  IV  1 g Intravenous Q24H  . clonazePAM  0.5 mg Oral BID  . dextromethorphan-guaiFENesin  1 tablet Oral BID  . famotidine  10 mg Oral Daily  . influenza  inactive virus vaccine  0.5 mL Intramuscular Tomorrow-1000  . methylPREDNISolone (SOLU-MEDROL) injection  60 mg Intravenous Q12H  . pantoprazole  40 mg Oral Q1200  . sodium chloride  3 mL Intravenous Q12H   Continuous Infusions:   Principal Problem:  *Community acquired pneumonia Active Problems:  Encephalopathy  Hypertension  Atrial fibrillation  Thrombocytopenia  Dehydration  Acute exacerbation of chronic obstructive pulmonary disease (COPD)    Time spent:  30 minutes including talking with daughter to obtain additional history regarding PMHx and current problems leading to admission    Renae Fickle  [facility] Pager [phone]. If 8PM-8AM, please contact night-coverage at [email or web address], password [facility] 02/12/2012, 11:27 AM  LOS: 2 days

## 2012-02-12 NOTE — Care Management Note (Signed)
    Page 1 of 1   02/14/2012     9:58:06 AM   CARE MANAGEMENT NOTE 02/14/2012  Patient:  Beth Greer, Beth Greer   Account Number:  [id number]  Date Initiated:  02/12/2012  Documentation initiated by:  GRAVES-BIGELOW,Ader Fritze  Subjective/Objective Assessment:   Pt admitted with SOB and new onset confusion. Treating for CAP. Treating with IV ABX. Pt has dme home 02, rw and cane. Pt is from [place] and visiting dauhgter here in [place] who just had total hip replacement. Pt has o2 via [employer].     Action/Plan:   CM will continue to monitor for disposition needs.   Anticipated DC Date:  02/14/2012   Anticipated DC Plan:  HOME/SELF CARE      DC Planning Services  CM consult  Patient refused services      Choice offered to / List presented to:             Status of service:  Completed, signed off Medicare Important Message given?   (If response is "NO", the following Medicare IM given date fields will be blank) Date Medicare IM given:   Date Additional Medicare IM given:    Discharge Disposition:  HOME/SELF CARE  Per UR Regulation:  Reviewed for med. necessity/level of care/duration of stay  If discussed at Long Length of Stay Meetings, dates discussed:    Comments:  02-14-12 0947 Tomi Bamberger, RN,BSN [phone] CM did speak to pt and she will not need any HH services. Pt stated she will be hoe with her daughter and she will be returning to [place]. She felt like she would have enough family that can take care of her.

## 2012-02-13 ENCOUNTER — Inpatient Hospital Stay (HOSPITAL_COMMUNITY): Payer: Medicare Other

## 2012-02-13 DIAGNOSIS — I671 Cerebral aneurysm, nonruptured: Secondary | ICD-10-CM

## 2012-02-13 LAB — TROPONIN I
Troponin I: <0.3 ng/mL (ref <0.30)
Troponin I: <0.3 ng/mL (ref <0.30)
Troponin I: <0.3 ng/mL (ref <0.30)
Troponin I: <0.3 ng/mL (ref <0.30)

## 2012-02-13 LAB — PROTIME-INR: INR: 1.78 — ABNORMAL HIGH (ref 0.00–1.49)

## 2012-02-13 MED ORDER — WARFARIN SODIUM 3 MG PO TABS
3.0000 mg | ORAL_TABLET | Freq: Once | ORAL | Status: AC
  Administered 2012-02-13: 3 mg via ORAL
  Filled 2012-02-13: qty 1

## 2012-02-13 MED ORDER — IPRATROPIUM BROMIDE 0.02 % IN SOLN
0.5000 mg | Freq: Three times a day (TID) | RESPIRATORY_TRACT | Status: DC
  Administered 2012-02-13 – 2012-02-14 (×2): 0.5 mg via RESPIRATORY_TRACT
  Filled 2012-02-13 (×3): qty 2.5

## 2012-02-13 MED ORDER — ATENOLOL 50 MG PO TABS
50.0000 mg | ORAL_TABLET | Freq: Every day | ORAL | Status: DC
  Administered 2012-02-13: 50 mg via ORAL
  Filled 2012-02-13: qty 1

## 2012-02-13 MED ORDER — ATENOLOL 100 MG PO TABS
100.0000 mg | ORAL_TABLET | Freq: Every day | ORAL | Status: DC
  Administered 2012-02-14: 100 mg via ORAL
  Filled 2012-02-13: qty 1

## 2012-02-13 MED ORDER — PREDNISONE 20 MG PO TABS
40.0000 mg | ORAL_TABLET | Freq: Every day | ORAL | Status: DC
  Administered 2012-02-14: 40 mg via ORAL
  Filled 2012-02-13 (×2): qty 2

## 2012-02-13 MED ORDER — ALBUTEROL SULFATE (5 MG/ML) 0.5% IN NEBU
2.5000 mg | INHALATION_SOLUTION | Freq: Three times a day (TID) | RESPIRATORY_TRACT | Status: DC
  Administered 2012-02-13 – 2012-02-14 (×2): 2.5 mg via RESPIRATORY_TRACT
  Filled 2012-02-13 (×3): qty 0.5

## 2012-02-13 MED ORDER — ALBUTEROL SULFATE (5 MG/ML) 0.5% IN NEBU: 2.5000 mg | INHALATION_SOLUTION | Freq: Four times a day (QID) | RESPIRATORY_TRACT | Status: DC

## 2012-02-13 MED ORDER — IOHEXOL 350 MG/ML SOLN
50.0000 mL | Freq: Once | INTRAVENOUS | Status: AC | PRN
  Administered 2012-02-13: 50 mL via INTRAVENOUS

## 2012-02-13 MED ORDER — WARFARIN - PHARMACIST DOSING INPATIENT: Freq: Every day | Status: DC

## 2012-02-13 MED ORDER — IPRATROPIUM BROMIDE 0.02 % IN SOLN: 0.5000 mg | Freq: Three times a day (TID) | RESPIRATORY_TRACT | Status: DC

## 2012-02-13 NOTE — Progress Notes (Signed)
Pt HR increased to 140's while up to [facility], pt c/o palpitations and had visible facial flushing to bilateral cheeks.  BP after assisted back to bed 152/76.  HR decreased to 90s with intermittent increasing to 110-130s while in bed.  Afib with occasional PVC's.  Pt also states she has coughed up blood.  NP for [facility] notified with no new orders at present.  Will continue to monitor.

## 2012-02-13 NOTE — Progress Notes (Signed)
ANTICOAGULATION CONSULT NOTE - Initial Consult  Pharmacy Consult for Warfarin Indication: atrial fibrillation  Allergies  Allergen Reactions  . Codeine     unknown  . Morphine And Related     unknown  . Sulfa Antibiotics     unknown    Patient Measurements: Height: 5\' 5"  (165.1 cm) Weight: 159 lb 1.6 oz (72.167 kg) IBW/kg (Calculated) : 57   Vital Signs: Temp: 97.5 F (36.4 C) (10/15 2000) Temp src: Oral (10/15 2000) BP: 124/83 mmHg (10/15 2000) Pulse Rate: 96  (10/15 2000)  Labs:  Basename 02/13/12 1758 02/13/12 1024 02/13/12 0555 02/13/12 0530 02/12/12 1758 02/12/12 1143 02/11/12 0941 02/11/12 0009 02/10/12 2315  HGB -- -- -- -- -- 12.4 11.5* -- --  HCT -- -- -- -- -- 36.4 34.8* -- 38.4  PLT -- -- -- -- -- 114* 97* -- 109*  APTT -- -- -- -- -- -- -- -- --  LABPROT -- -- 20.1* -- 21.3* -- -- 25.2* --  INR -- -- 1.78* -- 1.93* -- -- 2.42* --  HEPARINUNFRC -- -- -- -- -- -- -- -- --  CREATININE -- -- -- -- -- 0.93 0.81 -- 1.14*  CKTOTAL -- -- -- -- -- -- -- -- --  CKMB -- -- -- -- -- -- -- -- --  TROPONINI <0.30 <0.30 -- <0.30 -- -- -- -- --    Estimated Creatinine Clearance: 53.7 ml/min (by C-G formula based on Cr of 0.93).   Medical History: Past Medical History  Diagnosis Date  . Arthritis     DJD  . COPD (chronic obstructive pulmonary disease)   . Cancer     skin cancer  . Pacemaker 2002  . A-fib     coumadin  . Hypertension   . Stroke   . MI (myocardial infarction)   . Brain aneurysm   . Glaucoma   . CHF (congestive heart failure)   . Diaphragm paralysis   . Osteoarthritis of back     Medications:  Prescriptions prior to admission  Medication Sig Dispense Refill  . albuterol (PROVENTIL HFA;VENTOLIN HFA) 108 (90 BASE) MCG/ACT inhaler Inhale 2 puffs into the lungs every 6 (six) hours as needed. For shortness of breath      . atenolol (TENORMIN) 100 MG tablet Take 100 mg by mouth daily.      Marland Kitchen CALCIUM CARBONATE PO Take 1 tablet by mouth daily.       . clonazePAM (KLONOPIN) 0.5 MG tablet Take 0.5 mg by mouth 2 (two) times daily as needed. For anxiety      . diltiazem (TIAZAC) 240 MG 24 hr capsule Take 240 mg by mouth daily.      . furosemide (LASIX) 40 MG tablet Take 40 mg by mouth daily.      Marland Kitchen HYDROcodone-acetaminophen (NORCO) 10-325 MG per tablet Take 1 tablet by mouth 3 (three) times daily as needed. For pain      . omeprazole (PRILOSEC) 20 MG capsule Take 20 mg by mouth daily.      . potassium chloride SA (K-DUR,KLOR-CON) 20 MEQ tablet Take 40 mEq by mouth daily.      . ranitidine (ZANTAC) 150 MG tablet Take 150 mg by mouth 2 (two) times daily.      . rosuvastatin (CRESTOR) 10 MG tablet Take 10 mg by mouth at bedtime.      Marland Kitchen spironolactone (ALDACTONE) 50 MG tablet Take 50 mg by mouth daily.      . valsartan (DIOVAN) 80  MG tablet Take 80 mg by mouth daily.      Marland Kitchen warfarin (COUMADIN) 5 MG tablet Take 2.5 mg by mouth daily.        Assessment: Admit Complaint: 71 y.o.  female  admitted 02/10/2012 with confusion and shortness of breath.  Pharmacy consulted to dose warfarin, now that CT is negative for intracranial hemorrhage; aneurysm noted.  Given several days off warfarin will restart with 3 mg.  Goal of Therapy:  INR 2-3 Monitor platelets by anticoagulation protocol: Yes   Plan:  1. Warfarin 3 mg PO x 1 2. Daily INR   Thank you for allowing pharmacy to be a part of this patients care team.  Lovenia Kim Pharm.D., BCPS Clinical Pharmacist 02/13/2012 8:59 PM Pager: [phone] Phone: [phone]

## 2012-02-13 NOTE — Progress Notes (Addendum)
[facility]    SUBJECTIVE   No complaints at this time.    OBJECTIVE   Vital signs in last 24 hours: Temp:  [97.3 F (36.3 C)-98.4 F (36.9 C)] 98 F (36.7 C) (10/15 0800) Pulse Rate:  [82-183] 84  (10/15 0936) Resp:  [15-20] 16  (10/15 0800) BP: (99-129)/(64-102) 99/64 mmHg (10/15 0936) SpO2:  [94 %-99 %] 96 % (10/15 0910)  Intake/Output from previous day: 10/14 0701 - 10/15 0700 In: 790 [P.O.:690; IV Piggyback:100] Out: 952 [Urine:950; Stool:2] Intake/Output this shift: Total I/O In: 150 [P.O.:150] Out: 375 [Urine:375] Nutritional status: Cardiac  Past Medical History  Diagnosis Date  . Arthritis     DJD  . COPD (chronic obstructive pulmonary disease)   . Cancer     skin cancer  . Pacemaker 2002  . A-fib     coumadin  . Hypertension   . Stroke   . MI (myocardial infarction)   . Brain aneurysm   . Glaucoma   . CHF (congestive heart failure)   . Diaphragm paralysis   . Osteoarthritis of back     Neurologic ROS negative. 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 She is tired. Thought content appropriate. Speech fluent without evidence of aphasia. Able to follow 3 step commands without difficulty.  Cranial Nerves:  II: Discs flat bilaterally; Visual fields grossly normal, pupils equal, round, reactive to light and accommodation  III,IV, VI: ptosis not present, extra-ocular motions intact bilaterally  V,VII: smile symmetric, facial light touch sensation normal bilaterally  VIII: hearing normal bilaterally  IX,X: gag reflex present  XI: bilateral shoulder shrug  XII: midline tongue extension  Motor:  Right :   Upper extremity 4/5  Left:   Upper extremity 4/5    Lower extremity 4/5    Lower extremity 4/5   --Continues to have positive pronator drift in left UE  --Continues resting and positional tremor in left hand  that is exacerbated when patient is thinking or answering questions. Positional tremor bilaterally when hands are held out stretched.  Tone and bulk:normal tone throughout; no atrophy noted  Sensory: Pinprick and light touch intact throughout, bilaterally with exception of left fingers having decreased sensation  Deep Tendon Reflexes: 2+ and symmetric throughout  Plantars:  Right: downgoing Left: upgoing  Cerebellar:  normal finger-to-nose, normal heel-to-shin test  CV: pulses palpable throughout    Lab Results: No results found for this basename: cbc, bmp, coags, chol, tri, ldl, hga1c   Lipid Panel No results found for this basename: CHOL,TRIG,HDL,CHOLHDL,VLDL,LDLCALC in the last 72 hours  Studies/Results: Ct Head Wo Contrast  02/11/2012  *RADIOLOGY REPORT*  Clinical Data: Basal ganglia lesion  CT HEAD WITHOUT CONTRAST  Technique:  Contiguous axial images were obtained from the base of the skull through the vertex without contrast.  Comparison: 300 hours  Findings: The 5 mm round lesion in the left attainment associated with hyper densities is stable.  There is no surrounding vasogenic edema.  Chronic ischemic changes in the periventricular white matter and mild global atrophy are superimposed.  IMPRESSION: Stable 5 mm lesion in the left basal ganglia.  Differential diagnosis includes small focus of hemorrhage, cavernous angioma, a small vascular aneurysm.  MRI can  be performed to further delineate.   Original Report Authenticated By: Donavan Burnet, M.D.    Dg Chest Port 1 View  02/13/2012  *RADIOLOGY REPORT*  Clinical Data: Cough, chest wall pain, palpitations.  PORTABLE CHEST - 1 VIEW  Comparison: 02/10/2012  Findings: Cardiomegaly.  Left lower lobe atelectasis or pneumonia again noted, unchanged.  Minimal right base atelectasis.  Left pacer is unchanged.  IMPRESSION: No significant change.   Original Report Authenticated By: Cyndie Chime, M.D.     Medications:     Scheduled:    . ipratropium  0.5 mg Nebulization Q6H   And  . albuterol  2.5 mg Nebulization Q6H  . aspirin EC  81 mg Oral Daily  . atenolol  50 mg Oral Daily  . atorvastatin  20 mg Oral q1800  . azithromycin  500 mg Oral Q24H  . benzonatate  100 mg Oral BID  . cefTRIAXone (ROCEPHIN)  IV  1 g Intravenous Q24H  . clonazePAM  0.5 mg Oral BID  . dextromethorphan-guaiFENesin  1 tablet Oral BID  . diltiazem  120 mg Oral Daily  . famotidine  10 mg Oral Daily  . furosemide  40 mg Oral Daily  . gabapentin  300 mg Oral Daily  . influenza  inactive virus vaccine  0.5 mL Intramuscular Tomorrow-1000  . latanoprost  1 drop Right Eye QHS  . methylPREDNISolone (SOLU-MEDROL) injection  60 mg Intravenous Daily  . pantoprazole  40 mg Oral Q1200  . sodium chloride  3 mL Intravenous Q12H  . DISCONTD: methylPREDNISolone (SOLU-MEDROL) injection  60 mg Intravenous Q12H   2 D echo Study Conclusions  - Left ventricle: The cavity size was normal. There was mild concentric hypertrophy. Systolic function was normal. The estimated ejection fraction was in the range of 55% to 60%. There is akinesis of the apical myocardium. Doppler parameters are consistent with abnormal left ventricular relaxation (grade 1 diastolic dysfunction). - Aortic valve: Mild regurgitation. - Aorta: The aorta was moderately dilated and mildly calcified. - Mitral valve: Mild regurgitation. - Left atrium: The atrium was mildly dilated. - Right atrium: There is a calcified density within the mid right atrium of undetermined significance. Possible catheter/ pacemaker lead. The atrium was mildly dilated. - Tricuspid valve: Moderate regurgitation. - Pulmonary arteries: Systolic pressure was mildly increased. PA peak pressure: 41mm Hg (S).     Assessment/Plan:   71 y.o. female with Afib on chronic coumadin (most recent INR 1.78 with coumadin held) with hyperdensity visualized in left BG on CT scan of head. Exam continues to show no right  sided deficits. Although CT head likely calcification will obtain a CT angiogram of the brain to locate previous aneurysm and location for correlation of questionable bleed versus calcification.   Recommend:  1) Obtain a CTA of head to visualize location of aneurysm and verify if bleed.        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  Calcification from this is likely what is seen on the previous CT scans.  No evidence of hemorrhage.  With this in mind may restart Coumadin at previous dose.  No further neurologic intervention is recommended at this time.  If further questions arise, please call or page at that time.  Thank you for allowing neurology to participate in the care of this patient.  Thana Farr, MD [facility] [phone] 02/13/2012  6:06 PM

## 2012-02-13 NOTE — Progress Notes (Signed)
Event: RN notified that Rt in to do neb when pt c/o chest pain when she coughs. Pt is in persistent A-Fib which is chronic. Pt also reported palpitations and a flushing sensation approx 2 hrs ago when she ambulated to BR. Denied CP at that time. RN reported that HR did get up to 140's at that time but rate and symptoms resolved w/ rest. NP to bedside.  Subjective: Pt denies any CP but does admit that when she coughs she does experience mid chest wall pain and pain to her (L) posterior rib area w/ coughing. Denies that she has any CP unless she coughs. Denies SOB, nausea or other symptoms.  Objective: At bedside pt noted sleeping quietly in NAD. Recent VS  T-98.2, BP-122/74, HR- 106 irreg, RR-16 w/ 02 sats of 98% on 2L [place]. Arouses easily and continues to deny CP except w/ coughing. BBS diminished w/ non-productive cough w/ deep inspiration,  but otherwise CTA. EKG shows A-Fib w/ new T-wave inversion in V-2. Previously noted in V-3-6. PCXR pending. Cardiac panel pending. Assessment/Plan: 1. Chest pain (Per pt admission pain only w/ coughing). Episode of "palpitations" and "flushing" w/ ambulation that resolved w/ rest. EKG reveals persistent A-Fib w/ new T-wave inversion in V-2 (previously noted in V3-6). Favor pneumonia vs cardiac source for pain however will f/u PCXR, cycyle cardiac enzymes and continue to monitor closely. I have discussed pt w/ Dr Toniann Fail who has reviewed EKG and is in agreement w/ plan.  Leanne Chang, NP-C [facility] Page  [phone]

## 2012-02-13 NOTE — Progress Notes (Signed)
Physical Therapy Treatment Patient Details Name: Beth Greer MRN: [id number] DOB: 08/12/1940 Today's Date: 02/13/2012 Time: 4098-1191 PT Time Calculation (min): 23 min  PT Assessment / Plan / Recommendation Comments on Treatment Session  pt presents with CAP and A-fib.  pt improving mobility, but limited by elevated HR.  HR to 153 during ambulation.  Needed to take 3 standing rest breaks to allow HR to decrease to 120-130's.      Follow Up Recommendations  No PT follow up;Supervision - Intermittent     Does the patient have the potential to tolerate intense rehabilitation     Barriers to Discharge        Equipment Recommendations  None recommended by PT    Recommendations for Other Services    Frequency Min 3X/week   Plan Discharge plan remains appropriate;Frequency remains appropriate    Precautions / Restrictions Precautions Precautions: Fall Restrictions Weight Bearing Restrictions: No   Pertinent Vitals/Pain Denies pain.  HR up to 153 during ambulation.      Mobility  Bed Mobility Bed Mobility: Supine to Sit;Sitting - Scoot to Edge of Bed Supine to Sit: 6: Modified independent (Device/Increase time);With rails Sitting - Scoot to Edge of Bed: 6: Modified independent (Device/Increase time) Details for Bed Mobility Assistance: Needs increased time.   Transfers Transfers: Sit to Stand;Stand to Sit Sit to Stand: 5: Supervision;With upper extremity assist;From bed;From chair/3-in-1 Stand to Sit: 5: Supervision;With upper extremity assist;To chair/3-in-1 Details for Transfer Assistance: cues for use of UEs with stand from bed, but demo'd good technique from 3-in-1.   Ambulation/Gait Ambulation/Gait Assistance: 4: Min guard Ambulation Distance (Feet): 150 Feet Assistive device: Rolling walker Ambulation/Gait Assistance Details: pt needs cues for positioning in RW, upright posture.  2nd person utilized to watch HR monitor in hall and notify when pt HR increased to  140's.  pt needed to stand 3 times for rest break to allow HR to decrease to 120-130's.  AD watching monitors and noted highest HR 153.  pt notes no c/o when HR elevated.   Gait Pattern: Step-through pattern;Trunk flexed Stairs: No Wheelchair Mobility Wheelchair Mobility: No    Exercises     PT Diagnosis:    PT Problem List:   PT Treatment Interventions:     PT Goals Acute Rehab PT Goals Time For Goal Achievement: 02/26/12 PT Goal: Supine/Side to Sit - Progress: Met PT Goal: Sit to Stand - Progress: Progressing toward goal PT Goal: Stand to Sit - Progress: Progressing toward goal PT Goal: Ambulate - Progress: Progressing toward goal  Visit Information  Last PT Received On: 02/13/12 Assistance Needed: +1 (2nd person used to watch HR)    Subjective Data  Subjective: When I get going I want to keep going.     Cognition  Overall Cognitive Status: Impaired Area of Impairment: Memory Arousal/Alertness: Awake/alert Orientation Level: Appears intact for tasks assessed Behavior During Session: Beth Greer for tasks performed Memory Deficits: mild memory issues surrounding stay here and timeline    Balance  Balance Balance Assessed: Yes Static Standing Balance Static Standing - Balance Support: No upper extremity supported;During functional activity Static Standing - Level of Assistance: 5: Stand by assistance Static Standing - Comment/# of Minutes: pt able to perform own peri hygiene in standing without UE support for stability.    End of Session PT - End of Session Equipment Utilized During Treatment: Gait belt;Oxygen Activity Tolerance:  (Limited by elevated HR) Patient left: in chair;with call bell/phone within reach Nurse Communication: Mobility status  GP     Beth Greer, Beth Greer, Beth Greer 02/13/2012, 11:34 AM

## 2012-02-13 NOTE — Progress Notes (Signed)
[facility] PROGRESS NOTE  Beth Greer:[id number] DOB: 1941-01-03 DOA: 02/10/2012 PCP: Provider Not In System  Assessment/Plan:  Patient Active Problem List  Diagnosis  . Community acquired pneumonia  . Encephalopathy  . Hypertension  . Atrial fibrillation  . Thrombocytopenia  . Dehydration  . Acute exacerbation of chronic obstructive pulmonary disease (COPD)  . Anemia  . Acute respiratory failure  . Cough with hemoptysis  . Intracerebral hemorrhage    Confusion/AMS:  Resolved.  Was likely due to PNA/COPD exacerbation that led to dehydration from poor PO and continued use of potassium/lasix.  She also takes narcotics and may have had some lingering effects in the setting of dehydration-related AKI.    CT head showed possible calcification versus small hemorrhagic stroke in basal ganglion.  Patient has known small 3mm aneurysm per history.  Her repeat head CT demonstrated a stable 5mm "lesion" in the basal ganglion and suggested it may be a "small focus of hemorrhage, cavernous angioma, or a small vascular aneurysm."  Neurology was consulted and recommended CT angio brain which demonstrated a left MCA aneurysm with calcification and no evidence of hemorrhage.   -  Appreciate neurology assistance -  Restart warfarin -  Able to get copies reports of two previous head CTs from [place] which are on the paper chart.    Community acquired pneumonia:  Patient presented with possible left lower lobe pneumonia versus atelectasis and increased work of breathing.  No fevers or leukocytosis.  She coughed up green and bloody mucous plugs, but this is improving.  She is still oxygen dependent. -  Strep pneumo ag neg -  Ceftriaxone, day 3 -  Azithromycin, day 3 -  Continue mucinex DM, add tessalon  -  F/u sputum culture  Acute COPD exacerbation:  Patient uses home oxygen.  Per family, she has had multiple hospitalizations for COPD, but never intubated, never in the ICU.   -   Wean solumedrol today and convert to oral tomorrow AM -  Duonebs q6h -  Alb q2h prn -  Continue abx as above  Dehydration with hyperkalemia and hypercalcemia and mildly elevated BUN:Cr:  Improved after gentle hydration.    CHF/atrial fibrillation/HTN/HLD:   Telemetry demonstrates atrial fibrillation with RVR to 180s with exertion and she had an episode of chest pain that was a soreness with breathing that happened while ambulating overnight.  ECG showed a new T-wave inversion in V2, but troponins are negative x 3 again.  ECHO demonstrates normal ejection fraction with akinesis of the apical myocardium and grade 1 diastolic dysfunction.  Blood pressure improving and heart rate not controlled.     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  Continue clonazepam to avoid withdrawal symptoms  Normocytic anemia/thrombocytopenia:  May be related to acute infectious process.  No need for transfusion at this time.  Patient not on heparin. -  Trend  DIET:  Healthy heart ACCESS: PIV IVF:  OFF PROPH:  SCDs  Code Status: Full code Family Communication: Spoke with patient and with daughter Wilkie Aye who was at bedside.  Please update her daily at [phone].  Disposition Plan:   To home tomorrow if HR and blood pressure continue to improve.  No need for PT/OT follow up.     Consultants:  None  Procedures:  CT head 10/13  CT head 10/13  ECHO 10/13  CT angio heat 10/13  Antibiotics:  Ceftriaxone 10/13>>  Azithromycin 10/13>>  HPI/Subjective:  Patient states she has less cough and is coughing up less mucous.  She feels generally  weak and tired and her chest is still sore from coughing.   Objective: Filed Vitals:   02/13/12 0000 02/13/12 0108 02/13/12 0400 02/13/12 0800  BP: 122/74  124/84 118/78  Pulse: 95  93 92  Temp: 98.2 F (36.8 C)  98.4 F (36.9 C) 98 F (36.7 C)  TempSrc: Oral  Oral   Resp: 16  15 16   Height:      Weight:      SpO2: 95% 98% 98% 94%    Intake/Output Summary (Last 24 hours) at 02/13/12 0833 Last data filed at 02/13/12 0600  Gross per 24 hour  Intake    790 ml  Output    952 ml  Net   -162 ml   Filed Weights   02/11/12 0345  Weight: 72.167 kg (159 lb 1.6 oz)    Exam:   General:  CF, sitting in chair, no increased WOB  HEENT:   MMM  Cardiovascular:  Irregularly irregular, tachycardic, no m/r/g.    Respiratory: Rales at bilateral bases, I:E 1:4 and full expiratory wheeze.  No rhonchi  Abdomen: NABS, soft, nondistended, nontender  MSK:  Trace LEE, tender along medial tibias bilaterallly without erythema  Data Reviewed: Basic Metabolic Panel:  Lab 02/12/12 1610 02/11/12 0941 02/10/12 2315  NA 134* 135 135  K 3.7 4.2 5.6*  CL 98 101 101  CO2 22 24 22   GLUCOSE 144* 108* 123*  BUN 43* 34* 45*  CREATININE 0.93 0.81 1.14*  CALCIUM 10.1 10.1 11.2*  MG -- -- --  PHOS -- -- --   Liver Function Tests:  Lab 02/11/12 0941 02/10/12 2315  AST 20 24  ALT 12 14  ALKPHOS 74 97  BILITOT 0.6 0.5  PROT 6.4 7.5  ALBUMIN 3.9 4.6   No results found for this basename: LIPASE:5,AMYLASE:5 in the last 168 hours No results found for this basename: AMMONIA:5 in the last 168 hours CBC:  Lab 02/12/12 1143 02/11/12 0941 02/10/12 2315  WBC 5.7 9.1 10.6*  NEUTROABS -- 6.7 9.3*  HGB 12.4 11.5* 12.7  HCT 36.4 34.8* 38.4  MCV 92.9 93.5 94.8  PLT 114* 97* 109*   Cardiac Enzymes:  Lab 02/13/12 0530 02/11/12 1624 02/11/12 0941 02/10/12 2315  CKTOTAL -- -- -- --  CKMB -- -- -- --  CKMBINDEX -- -- -- --  TROPONINI <0.30 <0.30 <0.30 <0.30   BNP (last 3 results)  Basename  02/12/12 1143 02/10/12 2315  PROBNP 4333.0* 2149.0*   CBG: No results found for this basename: GLUCAP:5 in the last 168 hours  Recent Results (from the past 240 hour(s))  CULTURE, BLOOD (ROUTINE X 2)     Status: Normal (Preliminary result)   Collection Time   02/10/12 11:40 PM      Component Value Range Status Comment   Specimen Description BLOOD LEFT LOWER FOREARM   Final    Special Requests BOTTLES DRAWN AEROBIC ONLY [facility]   Final    Culture  Setup Time 02/11/2012 14:18   Final    Culture     Final  Value:        BLOOD CULTURE RECEIVED NO GROWTH TO DATE CULTURE WILL BE HELD FOR 5 DAYS BEFORE ISSUING A FINAL NEGATIVE REPORT   Report Status PENDING   Incomplete   CULTURE, BLOOD (ROUTINE X 2)     Status: Normal (Preliminary result)   Collection Time   02/10/12 11:45 PM      Component Value Range Status Comment   Specimen Description BLOOD RIGHT ARM   Final    Special Requests BOTTLES DRAWN AEROBIC AND ANAEROBIC 5CC EACH   Final    Culture  Setup Time 02/11/2012 14:18   Final    Culture     Final    Value:        BLOOD CULTURE RECEIVED NO GROWTH TO DATE CULTURE WILL BE HELD FOR 5 DAYS BEFORE ISSUING A FINAL NEGATIVE REPORT   Report Status PENDING   Incomplete   URINE CULTURE     Status: Normal   Collection Time   02/11/12 10:48 AM      Component Value Range Status Comment   Specimen Description URINE, RANDOM   Final    Special Requests NONE   Final    Culture  Setup Time 02/11/2012 21:42   Final    Colony Count 15,000 COLONIES/ML   Final    Culture     Final    Value: Multiple bacterial morphotypes present, none predominant. Suggest appropriate recollection if clinically indicated.   Report Status 02/12/2012 FINAL   Final   CULTURE, EXPECTORATED SPUTUM-ASSESSMENT     Status: Normal   Collection Time   02/11/12  1:37 PM      Component Value Range Status Comment   Specimen Description SPUTUM   Final    Special Requests Normal   Final    Sputum evaluation     Final    Value:  THIS SPECIMEN IS ACCEPTABLE. RESPIRATORY CULTURE REPORT TO FOLLOW.   Report Status 02/11/2012 FINAL   Final   CULTURE, RESPIRATORY     Status: Normal (Preliminary result)   Collection Time   02/11/12  1:37 PM      Component Value Range Status Comment   Specimen Description SPUTUM   Final    Special Requests NONE   Final    Gram Stain     Final    Value: ABUNDANT WBC PRESENT, PREDOMINANTLY PMN     RARE SQUAMOUS EPITHELIAL CELLS PRESENT     FEW GRAM POSITIVE COCCI IN PAIRS     RARE GRAM NEGATIVE RODS     RARE GRAM POSITIVE RODS   Culture Culture reincubated for better growth   Final    Report Status PENDING   Incomplete      Studies: Ct Head Wo Contrast  02/11/2012  *RADIOLOGY REPORT*  Clinical Data: Basal ganglia lesion  CT HEAD WITHOUT CONTRAST  Technique:  Contiguous axial images were obtained from the base of the skull through the vertex without contrast.  Comparison: 300 hours  Findings: The 5 mm round lesion in the left attainment associated with hyper densities is stable.  There is no surrounding vasogenic edema.  Chronic ischemic changes in the periventricular white matter and mild global atrophy are superimposed.  IMPRESSION: Stable 5 mm lesion in the left basal ganglia.  Differential diagnosis includes small focus of hemorrhage, cavernous angioma, a small vascular aneurysm.  MRI can be performed to further delineate.   Original Report Authenticated By: Donavan Burnet, M.D.     Scheduled Meds:    .  ipratropium  0.5 mg Nebulization Q6H   And  . albuterol  2.5 mg Nebulization Q6H  . aspirin EC  81 mg Oral Daily  . atorvastatin  20 mg Oral q1800  . azithromycin  500 mg Oral Q24H  . benzonatate  100 mg Oral BID  . cefTRIAXone (ROCEPHIN)  IV  1 g Intravenous Q24H  . clonazePAM  0.5 mg Oral BID  . dextromethorphan-guaiFENesin  1 tablet Oral BID  . diltiazem  120 mg Oral Daily  . famotidine  10 mg Oral Daily  . furosemide  40 mg Oral Daily  . gabapentin  300 mg Oral Daily  .  influenza  inactive virus vaccine  0.5 mL Intramuscular Tomorrow-1000  . latanoprost  1 drop Right Eye QHS  . methylPREDNISolone (SOLU-MEDROL) injection  60 mg Intravenous Daily  . pantoprazole  40 mg Oral Q1200  . sodium chloride  3 mL Intravenous Q12H  . DISCONTD: methylPREDNISolone (SOLU-MEDROL) injection  60 mg Intravenous Q12H   Continuous Infusions:   Principal Problem:  *Community acquired pneumonia Active Problems:  Encephalopathy  Hypertension  Atrial fibrillation  Thrombocytopenia  Dehydration  Acute exacerbation of chronic obstructive pulmonary disease (COPD)  Anemia  Acute respiratory failure  Cough with hemoptysis  Intracerebral hemorrhage    Time spent:  30 minutes including talking with daughter    Renae Fickle  [facility] Pager [phone]. If 8PM-8AM, please contact night-coverage at [email or web address], password [facility] 02/13/2012, 8:33 AM  LOS: 3 days

## 2012-02-13 NOTE — Progress Notes (Signed)
Pt awakened by respiratory therapist to do nebulizer treatment.  After treatment, pt reported chest soreness and cough.  EKG obtained with new inverted T waves in lead V2.  Will notify NP on call.

## 2012-02-14 DIAGNOSIS — J449 Chronic obstructive pulmonary disease, unspecified: Secondary | ICD-10-CM

## 2012-02-14 DIAGNOSIS — J441 Chronic obstructive pulmonary disease with (acute) exacerbation: Secondary | ICD-10-CM

## 2012-02-14 LAB — BASIC METABOLIC PANEL
Calcium: 9.5 mg/dL (ref 8.4–10.5)
GFR calc Af Amer: 59 mL/min — ABNORMAL LOW (ref >90)
GFR calc non Af Amer: 51 mL/min — ABNORMAL LOW (ref >90)
Glucose, Bld: 128 mg/dL — ABNORMAL HIGH (ref 70–99)
Potassium: 4.6 mEq/L (ref 3.5–5.1)
Sodium: 136 mEq/L (ref 135–145)

## 2012-02-14 LAB — TROPONIN I
Troponin I: <0.3 ng/mL (ref <0.30)
Troponin I: <0.3 ng/mL (ref <0.30)

## 2012-02-14 LAB — CULTURE, RESPIRATORY W GRAM STAIN: Culture: NORMAL

## 2012-02-14 LAB — CBC
Hemoglobin: 12.5 g/dL (ref 12.0–15.0)
MCH: 31.4 pg (ref 26.0–34.0)
MCHC: 33.6 g/dL (ref 30.0–36.0)
Platelets: 146 10*3/uL — ABNORMAL LOW (ref 150–400)
RDW: 12.8 % (ref 11.5–15.5)

## 2012-02-14 LAB — PROTIME-INR: Prothrombin Time: 17.9 seconds — ABNORMAL HIGH (ref 11.6–15.2)

## 2012-02-14 MED ORDER — MOXIFLOXACIN HCL 400 MG PO TABS: 400.0000 mg | ORAL_TABLET | Freq: Every day | ORAL | Status: DC

## 2012-02-14 MED ORDER — PREDNISONE 10 MG PO TABS: ORAL_TABLET | ORAL | Status: DC

## 2012-02-14 MED ORDER — DM-GUAIFENESIN ER 30-600 MG PO TB12: 1.0000 | ORAL_TABLET | Freq: Two times a day (BID) | ORAL | Status: AC

## 2012-02-14 MED ORDER — MOXIFLOXACIN HCL 400 MG PO TABS: 400.0000 mg | ORAL_TABLET | Freq: Every day | ORAL | Status: AC

## 2012-02-14 MED ORDER — PREDNISONE 10 MG PO TABS: ORAL_TABLET | ORAL | Status: AC

## 2012-02-14 MED ORDER — DM-GUAIFENESIN ER 30-600 MG PO TB12: 1.0000 | ORAL_TABLET | Freq: Two times a day (BID) | ORAL | Status: DC

## 2012-02-14 NOTE — Discharge Summary (Signed)
Physician Discharge Summary  Beth Greer:[id number] DOB: Jun 21, 1940 DOA: 02/10/2012  PCP: Provider Not In System  Admit date: 02/10/2012 Discharge date: 02/14/2012  Recommendations for Outpatient Follow-up:  1. Followup with her PCP in 2 weeks.  Discharge Diagnoses:  Principal Problem:  *Community acquired pneumonia Active Problems:  Encephalopathy  Hypertension  Atrial fibrillation  Thrombocytopenia  Dehydration  Acute exacerbation of chronic obstructive pulmonary disease (COPD)  Anemia  Acute respiratory failure  Cough with hemoptysis  Intracerebral hemorrhage  Cerebral aneurysm   Discharge Condition: Stable  Diet recommendation: Heart healthy diet  Filed Weights   02/11/12 0345  Weight: 72.167 kg (159 lb 1.6 oz)    History of present illness:  71 y.o. female  has a past medical history of Arthritis; COPD (chronic obstructive pulmonary disease); Cancer; Pacemaker (2002); A-fib; Hypertension; Stroke; MI (myocardial infarction); Brain aneurysm; and Glaucoma.  Presented with  Patient is confused and unable to provide much of any history. No family at bedside. Per ER MD she was brought in from home with confusion and worsening shortness of breath. She has history of COPD and apparently became more confused after doing her breathing treatment today. She was brought in to ED XR showed possible PNA labs were significant for mild hypercalcemia, elevated K at 5.6 and thrombocytopenia 109. No prior labs are available. ABG was attempted but unsuccessfully. She has hx of atrial fibrillation and is on coumadin.    Hospital Course:  Encephalopathy : -Resolved.  Was likely due to PNA/COPD exacerbation that led to dehydration from poor PO and continued use of potassium/lasix.  -She also takes narcotics and may have had some lingering effects in the setting of dehydration-related AKI.  -CT head showed possible calcification versus small hemorrhagic stroke in basal ganglion.  Patient has known small 3mm aneurysm per history. Her repeat head CT demonstrated a stable 5mm "lesion" in the basal ganglion and suggested it may be a "small focus of hemorrhage, cavernous angioma, or a small vascular aneurysm." Neurology was consulted and recommended CT angio brain which demonstrated a left MCA aneurysm with calcification and no evidence of hemorrhage.  - Restart warfarin.  Community acquired pneumonia:  -She is still oxygen dependent.  - Strep pneumo ag neg  - Ceftriaxone, day 3, - Azithromycin, day 3 change to avelox whichi she will continue at home for 7 a total of 7 days. - Continue mucinex DM, add tessalon   Acute COPD exacerbation:  -Patient uses home oxygen. - was treated with steroids.- and nebs. -avelox for 4 days.  AKI  with hyperkalemia and hypercalcemia: -Improved after gentle hydration.   CHF/atrial fibrillation/HTN/HLD:  Telemetry demonstrates atrial fibrillation with RVR to 180s with exertion and she had an episode of chest pain that was a soreness with breathing that happened while ambulating overnight. ECG showed a new T-wave inversion in V2, but troponins are negative x 3 again. ECHO demonstrates normal ejection fraction with akinesis of the apical myocardium and grade 1 diastolic dysfunction. Blood pressure improving and heart rate not controlled.  - Continue statin  - Increase dilt and atenolol to 240 mg and HR remained controlled. 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 Continue clonazepam to avoid withdrawal symptoms      Procedures:  CT and MRI of the head (i.e. Studies not automatically included, echos, thoracentesis, etc; not x-rays)  Consultations:  Neurology  Discharge  Exam: Filed Vitals:   02/13/12 2000 02/14/12 0000 02/14/12 0400 02/14/12 0800  BP: 124/83 109/68 100/65 112/78  Pulse: 96 93 67 88  Temp: 97.5  F (36.4 C) 97.5 F (36.4 C) 97.8 F (36.6 C) 97.4 F (36.3 C)  TempSrc: Oral Oral Oral   Resp: 16 14 16 16   Height:      Weight:      SpO2: 95% 97% 96% 97%    General: Awake alert and oriented x3 Cardiovascular: Regular rate and rhythm Respiratory: Good air movement and clear to auscultation  Discharge Instructions  Discharge Orders    Future Orders Please Complete By Expires   Diet - low sodium heart healthy      Increase activity slowly          Medication List     As of 02/14/2012  8:33 AM    TAKE these medications         albuterol 108 (90 BASE) MCG/ACT inhaler   Commonly known as: PROVENTIL HFA;VENTOLIN HFA   Inhale 2 puffs into the lungs every 6 (six) hours as needed. For shortness of breath      atenolol 100 MG tablet   Commonly known as: TENORMIN   Take 100 mg by mouth daily.      CALCIUM CARBONATE PO   Take 1 tablet by mouth daily.      clonazePAM 0.5 MG tablet   Commonly known as: KLONOPIN   Take 0.5 mg by mouth 2 (two) times daily as needed. For anxiety      dextromethorphan-guaiFENesin 30-600 MG per 12 hr tablet   Commonly known as: MUCINEX DM   Take 1 tablet by mouth 2 (two) times daily.      diltiazem 240 MG 24 hr capsule   Commonly known as: TIAZAC   Take 240 mg by mouth daily.      furosemide 40 MG tablet   Commonly known as: LASIX   Take 40 mg by mouth daily.      HYDROcodone-acetaminophen 10-325 MG per tablet   Commonly known as: NORCO   Take 1 tablet by mouth 3 (three) times daily as needed. For pain      moxifloxacin 400 MG tablet   Commonly known as: AVELOX   Take 1 tablet (400 mg total) by mouth daily.      omeprazole 20 MG capsule   Commonly known as: PRILOSEC   Take 20 mg by mouth daily.      potassium chloride SA 20 MEQ tablet   Commonly known as: K-DUR,KLOR-CON   Take 40 mEq by mouth daily.      predniSONE 10 MG tablet   Commonly known as: DELTASONE   Takes 3 tablets for 1 days, then 2 tablets for 1 days, then 1  tablets for 1 days,  and then stop.      ranitidine 150 MG tablet   Commonly known as: ZANTAC   Take 150 mg by mouth 2 (two) times daily.      rosuvastatin 10 MG tablet   Commonly known as: CRESTOR   Take 10 mg by mouth at bedtime.      spironolactone 50 MG tablet   Commonly known as: ALDACTONE   Take 50 mg by mouth daily.      valsartan 80 MG tablet   Commonly known as: DIOVAN   Take 80 mg by mouth daily.      warfarin 5 MG tablet   Commonly known as: COUMADIN   Take  2.5 mg by mouth daily.           Follow-up Information    Follow up with Provider Not In System.   Contact information:   Sofie Rower          The results of significant diagnostics from this hospitalization (including imaging, microbiology, ancillary and laboratory) are listed below for reference.    Significant Diagnostic Studies: Ct Angio Head W/cm &/or Wo Cm  02/13/2012  *RADIOLOGY REPORT*  Clinical Data:  Rule out hemorrhage or aneurysm.  Abnormal CT head  CT ANGIOGRAPHY HEAD  Technique:  Multidetector CT imaging of the head was performed using the standard protocol during bolus administration of intravenous contrast.  Multiplanar CT image reconstructions including MIPs were obtained to evaluate the vascular anatomy.  Contrast: 50mL OMNIPAQUE IOHEXOL 350 MG/ML SOLN  Comparison:  CT head 02/11/2012  Findings:  Hyperdense area in the region of the left middle cerebral artery shows enhancement compatible with an aneurysm. This corresponds to the abnormality on the prior CT.  Negative for intracranial hemorrhage.  There is atrophy and mild chronic microvascular ischemia.  No acute infarct or mass.  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  The aneurysm projects superiorly. There is irregularity of the dome of the aneurysm suggesting an area of weakening of the aneurysm wall.  No other aneurysm is identified.  Left vertebral artery is dominant and patent to the basilar. Hypoplastic  distal right vertebral artery which does contribute to the basilar.  The basilar is widely patent.  Patent posterior communicating artery bilaterally with hypoplastic   P1 segment bilaterally.  Both posterior cerebral arteries are patent.  Cavernous carotid is patent bilaterally without stenosis or aneurysm.  Anterior and middle cerebral arteries are patent bilaterally without evidence of stenosis.   Review of the MIP images confirms the above findings.  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  The aneurysm projects superiorly.  There is irregularity of the dome of the aneurysm which may indicate an area of weakening.  Negative for intracranial hemorrhage.  No other aneurysm is identified.   Original Report Authenticated By: Camelia Phenes, M.D.    Ct Head Wo Contrast  02/11/2012  *RADIOLOGY REPORT*  Clinical Data: Basal ganglia lesion  CT HEAD WITHOUT CONTRAST  Technique:  Contiguous axial images were obtained from the base of the skull through the vertex without contrast.  Comparison: 300 hours  Findings: The 5 mm round lesion in the left attainment associated with hyper densities is stable.  There is no surrounding vasogenic edema.  Chronic ischemic changes in the periventricular white matter and mild global atrophy are superimposed.  IMPRESSION: Stable 5 mm lesion in the left basal ganglia.  Differential diagnosis includes small focus of hemorrhage, cavernous angioma, a small vascular aneurysm.  MRI can be performed to further delineate.   Original Report Authenticated By: Donavan Burnet, M.D.    Ct Head Wo Contrast  02/11/2012  *RADIOLOGY REPORT*  Clinical Data: Altered mental status  CT HEAD WITHOUT CONTRAST  Technique:  Contiguous axial images were obtained from the base of the skull through the vertex without contrast.  Comparison: None.  Findings: Motion degraded images.  No evidence of parenchymal hemorrhage or extra-axial fluid collection. No mass lesion,  mass effect, or midline shift.  No CT evidence of acute infarction.  Mild hyperdensity in the left basal ganglia, favored to reflect calcification given the lack of surrounding vasogenic  edema.  Additional apparent hyperdensity in the left frontal lobe this likely related to streak artifact (series 2/image 18).  Subcortical white matter and periventricular small vessel ischemic changes.  The visualized paranasal sinuses are essentially clear. The mastoid air cells are unopacified.  No evidence of calvarial fracture.  IMPRESSION: Motion degraded images.  No evidence of acute intracranial abnormality.  Mild hyperdensity in the left basal ganglia is favored to reflect benign calcification.  If there is continued clinical concern, consider a follow-up CT in 6 hours to document stability.   Original Report Authenticated By: Charline Bills, M.D.    Dg Chest Port 1 View  02/13/2012  *RADIOLOGY REPORT*  Clinical Data: Cough, chest wall pain, palpitations.  PORTABLE CHEST - 1 VIEW  Comparison: 02/10/2012  Findings: Cardiomegaly.  Left lower lobe atelectasis or pneumonia again noted, unchanged.  Minimal right base atelectasis.  Left pacer is unchanged.  IMPRESSION: No significant change.   Original Report Authenticated By: Cyndie Chime, M.D.    Dg Chest Port 1 View  02/10/2012  *RADIOLOGY REPORT*  Clinical Data: Altered mental status.  Shortness of breath.  COPD. Prior myocardial infarction.  PORTABLE CHEST - 1 VIEW  Comparison: None.  Findings: The patient is rotated to the left on today's exam, resulting in reduced diagnostic sensitivity and specificity.   Dual lead pacer noted with cardiomegaly and atherosclerotic calcification of the aortic arch.  Airspace opacity noted in the left lower lobe, obscuring the left hemidiaphragm.  Linear atelectasis or scarring noted along the right hemidiaphragm.  IMPRESSION: 1.  Left lower lobe atelectasis or pneumonia.  2.  Cardiomegaly without overt edema.  3.  Subsegmental  atelectasis or scarring along the right hemidiaphragm.  4.  Dual lead pacer in place.   Original Report Authenticated By: Dellia Cloud, M.D.     Microbiology: Recent Results (from the past 240 hour(s))  CULTURE, BLOOD (ROUTINE X 2)     Status: Normal (Preliminary result)   Collection Time   02/10/12 11:40 PM      Component Value Range Status Comment   Specimen Description BLOOD LEFT LOWER FOREARM   Final    Special Requests BOTTLES DRAWN AEROBIC ONLY 6CC   Final    Culture  Setup Time 02/11/2012 14:18   Final    Culture     Final    Value:        BLOOD CULTURE RECEIVED NO GROWTH TO DATE CULTURE WILL BE HELD FOR 5 DAYS BEFORE ISSUING A FINAL NEGATIVE REPORT   Report Status PENDING   Incomplete   CULTURE, BLOOD (ROUTINE X 2)     Status: Normal (Preliminary result)   Collection Time   02/10/12 11:45 PM      Component Value Range Status Comment   Specimen Description BLOOD RIGHT ARM   Final    Special Requests BOTTLES DRAWN AEROBIC AND ANAEROBIC 5CC EACH   Final    Culture  Setup Time 02/11/2012 14:18   Final    Culture     Final    Value:        BLOOD CULTURE RECEIVED NO GROWTH TO DATE CULTURE WILL BE HELD FOR 5 DAYS BEFORE ISSUING A FINAL NEGATIVE REPORT   Report Status PENDING   Incomplete   URINE CULTURE     Status: Normal   Collection Time   02/11/12 10:48 AM      Component Value Range Status Comment   Specimen Description URINE, RANDOM   Final    Special  Requests NONE   Final    Culture  Setup Time 02/11/2012 21:42   Final    Colony Count 15,000 COLONIES/ML   Final    Culture     Final    Value: Multiple bacterial morphotypes present, none predominant. Suggest appropriate recollection if clinically indicated.   Report Status 02/12/2012 FINAL   Final   CULTURE, EXPECTORATED SPUTUM-ASSESSMENT     Status: Normal   Collection Time   02/11/12  1:37 PM      Component Value Range Status Comment   Specimen Description SPUTUM   Final    Special Requests Normal   Final     Sputum evaluation     Final    Value: THIS SPECIMEN IS ACCEPTABLE. RESPIRATORY CULTURE REPORT TO FOLLOW.   Report Status 02/11/2012 FINAL   Final   CULTURE, RESPIRATORY     Status: Normal (Preliminary result)   Collection Time   02/11/12  1:37 PM      Component Value Range Status Comment   Specimen Description SPUTUM   Final    Special Requests NONE   Final    Gram Stain     Final    Value: ABUNDANT WBC PRESENT, PREDOMINANTLY PMN     RARE SQUAMOUS EPITHELIAL CELLS PRESENT     FEW GRAM POSITIVE COCCI IN PAIRS     RARE GRAM NEGATIVE RODS     RARE GRAM POSITIVE RODS   Culture Culture reincubated for better growth   Final    Report Status PENDING   Incomplete      Labs: Basic Metabolic Panel:  Lab 02/14/12 1610 02/12/12 1143 02/11/12 0941 02/10/12 2315  NA 136 134* 135 135  K 4.6 3.7 4.2 5.6*  CL 101 98 101 101  CO2 26 22 24 22   GLUCOSE 128* 144* 108* 123*  BUN 51* 43* 34* 45*  CREATININE 1.06 0.93 0.81 1.14*  CALCIUM 9.5 10.1 10.1 11.2*  MG -- -- -- --  PHOS -- -- -- --   Liver Function Tests:  Lab 02/11/12 0941 02/10/12 2315  AST 20 24  ALT 12 14  ALKPHOS 74 97  BILITOT 0.6 0.5  PROT 6.4 7.5  ALBUMIN 3.9 4.6   No results found for this basename: LIPASE:5,AMYLASE:5 in the last 168 hours No results found for this basename: AMMONIA:5 in the last 168 hours CBC:  Lab 02/14/12 0520 02/12/12 1143 02/11/12 0941 02/10/12 2315  WBC 8.0 5.7 9.1 10.6*  NEUTROABS -- -- 6.7 9.3*  HGB 12.5 12.4 11.5* 12.7  HCT 37.2 36.4 34.8* 38.4  MCV 93.5 92.9 93.5 94.8  PLT 146* 114* 97* 109*   Cardiac Enzymes:  Lab 02/14/12 0520 02/13/12 2205 02/13/12 1758 02/13/12 1024 02/13/12 0530  CKTOTAL -- -- -- -- --  CKMB -- -- -- -- --  CKMBINDEX -- -- -- -- --  TROPONINI <0.30 <0.30 <0.30 <0.30 <0.30   BNP: BNP (last 3 results)  Basename 02/12/12 1143 02/10/12 2315  PROBNP 4333.0* 2149.0*   CBG: No results found for this basename: GLUCAP:5 in the last 168 hours  Time coordinating  discharge: *30 minutes  Signed:  Marinda Elk  [facility] 02/14/2012, 8:33 AM

## 2012-02-14 NOTE — Progress Notes (Signed)
Pt provided with d/c instructions and education Pt verbalized understanding. Pt eucated on new medications and how to take them. Pt able to [employer]. Pt has no questions at this time. IV removed x2 with tip intact. Heart monitor cleaned and returned to front. Will continue to monitor until family arrives to pick pt up. Ramond Craver, RN

## 2012-02-17 LAB — CULTURE, BLOOD (ROUTINE X 2)
Culture: NO GROWTH
Culture: NO GROWTH

## 2012-09-21 NOTE — Consults (Signed)
[facility]                                [facility] ACCESS CONSULTATION              NAME: [name], [name]                      ADMITTED:   04/03/2011       MR#:  [id number]                                    ACCT#:      [id number]       DOB:  [birth year]       _________________________________________________________________________              [facility] ACCESS              DATE OF CONSULTATION:       04/03/2011              DIAGNOSIS:       Rectus hematoma secondary to anticoagulation.              HISTORY:       This is a 72 year old female, transferred from [facility].       She had just been discharged from the hospital after 7- to 10-day       admission for pneumonia.  Apparently, she was improved, did have some       right-sided abdominal pain at the time of discharge.  This became more       severe and was seen in the emergency room at [facility].  Ultimately, a CT       scan shows a rectus mass, probable hematoma, which reported to measure       10 x 5 cm.  Because of some inflammatory changes and apparently question       of the appearance of the fluid, they raised the possibility of abscess,       although primary hematoma is more likely.              She is on Coumadin because of atrial fibrillation ______ for many years.       She has had episodes of pneumonia.  She has had a previous hysterectomy.              She has her Coumadin followed, but her INR was elevated at 3.65.              Her white count is 13.5.  Hemoglobin 12.6, hematocrit 37.5, platelets       186.              PHYSICAL EXAMINATION:       VITAL SIGNS:  Stable.  Blood pressure 162/80, pulse 82, respirations 18.       LUNGS:  Some coarse rales.       HEART:  Irregular rhythm.       ABDOMEN:  Obese, tender particularly right side, but right lower       quadrant being more tender and somewhat more pronounced in the left       compatible with the CT findings.              IMPRESSION AND PLAN:        Probable  primary hematoma, probably related to coughing with her       pneumonia and her excess anticoagulation.  At this point, she has been       reversed with fresh frozen plasma.  I will restart her at the       appropriate time.  I generally would not drain these, but depending on       her course and findings, possibility of future percutaneous or open       drainage.  We will follow from surgical standpoint.                                            **PRELIMINARY REPORT UNLESS SIGNED**                                        SEE DOCUMENT IMAGING SYSTEM FOR FINAL REPORT                                                                        ________________________________                                                Rocco Serene, MD                                                                                                                                ID:   [id number]                                                                  DocType:   [id number]       \:   AY                                                                         /:   263  DD:  04/04/2011 05:57:52                                                        DT:  04/04/2011 13:53:36                                                        JOB: [id number]                                                                    >                                                                          Authenticated by Vern Claude, MD On 04/04/2011 08:43:43 PM

## 2012-09-21 NOTE — Consults (Signed)
[name]                                 CONSULTATION              NAME: [name], [name]              ADMITTED:   04/03/2011       MR#:  [id number]                            ACCT#:      [id number]       DOB:  [birth year]       _________________________________________________________________              [facility]              DATE OF CONSULTATION:       04/10/2011              CONSULTING PHYSICIAN:       Cordarryl Monrreal L. Huma Imhoff, MD              REQUESTING PHYSICIAN:       Rodman Pickle, MD              REASON FOR CONSULT:       Dysphagia.              HISTORY OF PRESENT ILLNESS:       [name] is a very pleasant 72 year old white female,       somewhat of a poor historian, who has a history of chronic lung       disease with recent admission for pneumonia, she was admitted       for approximately 7-10 days and was on steroids at that time.       She also is chronically on Coumadin for atrial fibrillation.       When she left the hospital, she apparently had a coughing spell       and developed some abdominal pain.  She was brought back to the       hospital and was found to have a hematoma in the rectus sheath       for having an elevated INR at 3.65 and a cough.  She is doing       better in relation to that, General Surgery is not anticipating       taking her to the OR for drainage.  Her Coumadin was reversed       and currently her INR is 1.0.  While [name] was here, she       was complaining of oral thrush and she was treated for that;       however, since also she has been in the hospital, she has had       some difficulty after she swallows.  She has a "burning raw       feeling" in her chest, which does radiate to underneath her       breasts, seems to be right greater than left.  She has put       herself on a soft diet.  She says she had two bites of bacon       yesterday, but had to chew it up very well.  She has been eating       mashed potatoes and  gravy.  She notices that the food feels like       it gets hung up in her chest, but eventually does go down.  She       does not have any nausea or vomiting.  She had some very mild       epigastric tenderness.  At home, she says that she takes Zantac       twice a day and that helps her.  She does not have heartburn at       home.  She takes Tums, if she does.  She denies any reflux.  She       denies at home any epigastric pain.  In the remote past, she has       seen Dr. Lynnea Maizes in [place] and apparently he has       performed an EGD for her and she notes a duodenal ulcer years       ago.  She also notes a history of having difficulty swallowing       and after dilation felt much better.  She says it does feel like       that.  She says her colonoscopy was way more than five years       ago, but she thinks that it was okay.  Of note, on her recent       CT, she did have some diverticulosis.  She has chronic       constipation, she has a bowel movement every two to three days       and that has not changed for her.  No blood, no melena.  Also,       on chest x-ray here, it was noted that she has a thoracic aortic              aneurysm, it was also noted on the CT of the chest in 2008 with       just some mild dilatation.  In general, she is getting better       from the reason that she was admitted; however, due to the       difficulty with swallowing even after being treated for thrush,       GI was consulted for an upper endoscopy.              PAST MEDICAL HISTORY:       1.  Rectus sheath hematoma secondary to cough and           supratherapeutic INR.       2.  Recent pneumonia and steroid therapy.       3.  Status post treatment for oral candidiasis.       4.  Sick sinus syndrome and atrial fibrillation,           maintained on Coumadin therapy.           a.   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       5.  History of a brain aneurysm.       6.  Thoracic aortic  aneurysm, unknown size, noted on chest           x-ray.       7.  Chronic respiratory failure on O2 therapy with history           of asthma.       8.  Hypertension, her  blood pressures are significantly           elevated here in the hospital.       9.  Dyslipidemia.       10.  GERD.       11.  Glaucoma.       12.  Status post total hysterectomy.       13.  Status post bilateral knee replacement.       14.  Chronic constipation.       15.  Diabetes mellitus.  A1c is 6.3% making this           diet-controlled.       16.  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       17.  History of duodenal ulcer.              SOCIAL HISTORY:       She does not use tobacco.  She has a supportive family.  A       daughter of hers lives in [place].              FAMILY HISTORY:       Positive for hypertension.              ALLERGIES:       To CODEINE, MORPHINE, IVP DYE, and IODINE.              MEDICATIONS:       At home are atenolol, calcium, Cardizem, Coumadin, Crestor,       Diovan, K-Dur, Lasix, oxygen, Zantac 150 b.i.d., Xalatan       ophthalmic, albuterol, clonazepam, Percocet, prednisone as       needed, temazepam, and Tessalon Perles.              REVIEW OF SYSTEMS:       As above, otherwise negative.              PHYSICAL EXAMINATION:       VITAL SIGNS:  Please refer to the nursing notes.       GENERAL:  She is a 72 year old white female, who appears older       than her stated age, does not appear to be in any distress.  She       is accompanied by a very supportive friend.       SKIN:  She seems pale, but her skin is warm and dry.       HEENT:  Pupils are equal and reactive to light.  Extraocular              muscles are intact.  Eyes are anicteric.  Oropharynx is clear.       She is on oxygen by nasal cannula.  Tongue is midline.  Speech       is normal.  Hearing is intact.       NECK:  No lymphadenopathy or thyromegaly.       LUNGS:  She has harsh breath sounds on expiration, rhonchi.  I       did  not appreciate any crackles.  There is wheezing.       CARDIAC:  She has regular rhythm on my exam.       ABDOMEN:  Her abdomen is soft, nontender, without pulsation or       mass palpable.  There are positive bowel sounds.       EXTREMITIES.  She seems to  have some trace edema bilaterally.       She moves her upper extremities normally.       NEURO:  She is conscious, alert, and oriented with no gross       neurological deficit.  She does have some difficulty with       providing history.              LABORATORY DATA:       Hemoglobin 8.5.  WBC 6.4.  INR is 1.0.              IMPRESSION/PLAN:       This is a 72 year old white female with a remote history of a       recent onset of substernal chest burning after swallowing a soft       diet.  She has been treated for oropharyngeal Candida; however,       symptoms persist with her recent steroid therapy for pneumonia.       We will discuss with Dr. Carmelina Noun; however, patient will undergo an       upper endoscopy today.  Further recommendations will be made       pending the procedure and Dr. Salley Hews evaluation.              THE ABOVE DICTATED BY Rebeca Alert, PA-C              ADDENDUM DICTATED BY Dorothea Ogle, MD, 04/10/2011 13:12:                     CONSULTING PHYSICIAN:       Jakota Manthei L. Tanaya Dunigan, MD              I saw and evaluated Debroah Loop at 1300 on April 10, 2011.  Below is my assessment and plan.              ASSESSMENT AND PLAN:       1.  Increasing dysphagia and odynophagia with a remote           history of Candida esophagitis.  I am unsure as to how the           diagnosis is substantiated.  I have not been able to find any           primary source verification of record.  However, she           continues to have swallowing difficulties and this certainly           deserves investigation.       2.  Some central chest pain and burning that may be           related to either reflux disease or whatever is causing her           odynophagia.        3.  History of chronic gastroesophageal reflux disease.       4.  History of intermittent functional constipation.       5.  History of diverticulosis.       6.  Screening for colorectal cancer, apparently up-to-date.       7.  Chronic anticoagulation with numerous medical problems.              RECOMMENDATIONS:       1.  We will plan upper endoscopy today.       2.  Further recommendations after upper  endoscopy.       3.  Further management and recommendations after           completion of the above.                     REV SG, 04/11/2011, 0945 HOURS.                                            **PRELIMINARY REPORT UNLESS SIGNED**                                SEE DOCUMENT IMAGING SYSTEM FOR FINAL REPORT                                        The above dictated by Rebeca Alert, PA-C                                        ________________________________                                        Gwenith Spitz, MD                                                                                                                 ID:   [id number]                                                          DocType:   [id number]       \:   SY                                                                 /:   412       DD:  04/10/2011 11:58:23                                                DT:  04/10/2011 21:05:16  JOB: [id number]                                                            CC:         >                                                                  Authenticated by Gwenith Spitz., MD On 04/12/2011 11:43:36 AM

## 2012-09-21 NOTE — H&P (Signed)
[facility]                             HISTORY AND PHYSICAL              NAME: NIZA, SODERHOLM [name]              ADMITTED:   04/03/2011       MR#:  [id number]                            ACCT#:      [id number]       DOB:  [birth year]       _________________________________________________________________       PRIMARY CARE PHYSICIAN:       No PCP.              CHIEF COMPLAINT:       Abdominal pain.              HISTORY OF PRESENT ILLNESS:       This is a 72 year old female transferred from [facility] due to abdominal wall hematoma versus abscess.       According to the patient, she was discharged two days ago from       [facility] after being there for approximately 10       days due to a pneumonia.  She was doing fine, but on the day of       discharge, she had some abdominal pain in the right side.       Because the patient has some chronic constipation, she gets       intermittent abdominal pain, so initially she did not pay       attention to it.  But this time, which is two days prior to       admission, the pain was different.  It was not as usual; this       time, it was more sharp, more localized in the right side of the       abdomen, no radiation.  Because the pain persisted for the past       two days, she decided to go to [facility].  Over there, they       did an initial abdominal x-ray that showed a possible small       bowel obstruction, so they decided to do a CAT scan of the       abdomen.  It showed a collection, possibly hematoma or abscess,       in the right rectus sheath, 10 x 5 cm with some surrounding       inflammatory changes in the soft tissues.  They also found mild       dilation of small bowel loops, but the patient denies any fever,       any nausea or vomiting.              REVIEW OF SYSTEMS:       The patient said that she has intermittent constipation, but she       moved her bowels this morning.  She denies fever  or chest pain.              Because of all these problems, the patient was sent to this       facility for further  workup and management.              PAST MEDICAL HISTORY:       1.  History of brain aneurysm diagnosed 2001.  She did not           have any intracranial hemorrhage.  This was diagnosed as part           of a CAT scan study due to some transient weakness.       2.  Asthma.       3.  Hypertension.       4.  Dyslipidemia.       5.  Atrial fibrillation, on Coumadin for 10 years.       6.  GERD.       7.  Glaucoma.       8.  Pneumonia two weeks ago, completed treatment.       9.  Chronic respiratory failure.  Patient is unsure why;           she denies being a smoker, and she denies having diagnosis of           COPD.              PAST SURGICAL HISTORY:       Total abdominal hysterectomy, bilateral knee replacement.                     FAMILY HISTORY:       Positive for hypertension.              SOCIAL HISTORY:       Patient never smoked.  Denies use of alcohol or illicit drugs.              REVIEW OF SYSTEMS:       Pertinent positives:  Odynophagia; the patient was told that she       has esophageal candidiasis.  She has been getting Nystatin       topically for the past two days.  Also has intermittent       constipation with abdominal pain, but the patient moved her       bowels today.  Denies vomiting, nausea, or fever.  Rest of       10-point review of systems was done and negative except as in       HPI.              PHYSICAL EXAMINATION:       VITAL SIGNS:  Temperature 98.1, heart rate 76, respirations, O2       sat 100% on 2 liters via nasal cannula.  Blood pressure 153/91.       CONSTITUTIONAL:  The patient is awake, alert.  She does not seem       to be in any acute distress right now.       HEENT:  Eyes:  Pupils equal, round, reactive to light and       accommodation.  Extraocular movements intact.       ENT AND MOUTH:  Ears without any abnormalities or secretions.       No nasal discharge or  lesions.  Moist mucous membranes.  Pink       oropharynx.  I do not see any signs of oral thrush.       NECK:   No JVD.  No masses.  Trachea midline.       CARDIOVASCULAR:  Regular rate and rhythm; rate of  78.       PULMONARY:  Patient is not using oxygen right now.  There are       decreased breath sounds diffusely, but no crackles, no wheezes.       GASTROINTESTINAL:  Positive bowel sounds.  Soft, no guarding.       There is some localized superficial tenderness in the right side       of the abdomen, close to midline with some palpable mass; this       is grossly about 12 cm in length by 6 cm wide.       EXTREMITIES:  No clubbing, cyanosis, edema.  Chronic       hyperpigmentation in the pretibial areas.       NEUROLOGIC:  No focal findings.              LABORATORY WORK:       CBC showing white blood cells of 18.4, hemoglobin 13.8,       hematocrit 42.7, platelets 243, and 3% bands.  Toxic       granulations present.              INR 3.85.              CMP showing sodium 138, potassium 4.9, chloride 101, CO2 of 28.       Glucose 128, BUN 26, creatinine 0.75.  Calcium 10.4, albumin       3.7, alkaline phosphatase 78, AST 52, ALT 91.              Abdominal and chest x-ray showing minimal cardiomegaly,       persistently elevated left hemidiaphragm, unchanged from the       prior study.  Mild dilation of small bowel loops of the right       periumbilical region.              CT of the abdomen showing mixed attenuation collection, possibly       hematoma or abscess, in the right rectal sheath from the right       periumbilical region to the level of the symphysis pubis.       Inflammatory changes seen surrounding soft tissues.  Localized       inflammation with mildly dilated small bowel loops.                     ASSESSMENT:       1.  Abscess versus infected hematoma of the right rectus           sheath.       2.  Hypertension.       3.  Atrial fibrillation with supratherapeutic INR.       4.  Dyslipidemia.       5.   History of brain aneurysm.       6.  Chronic respiratory failure on oxygen.       7.  Odynophagia/esophageal thrush.              PLAN:       1.  The main problem right now is this mass in the           abdomen.  This is possibly just an abscess, or most likely a           hematoma with superimposed infection.  In this case, given it           is very likely that  this is infected given the leukocytosis           with toxic granulation, I will put the patient on           antibiotics, starting with Zosyn for now.  I also consulted           Surgery to take care of the patient to see if drainage is           appropriate at this point.  Because of that, I am going to           stop the Coumadin in this patient, given that this is likely           a hematoma.  I spoke with Surgery about doing just vitamin K           p.o. versus FFP.  At this point, we think it is more           appropriate to do the FFP in case the patient needs surgical           approach later tonight.  I will give 1 unit and then recheck           INR.  I am going to stop the Coumadin, and the patient           understands that it will put her at high risk for developing           stroke, but at this point, I think the Coumadin risks           outweigh the benefits.  Also, going to get a stat CBC to see           if there is a significant drop in the hemoglobin level, and           transfuse as needed.       2.  For the hypertension, will continue home medications           and use p.r.n. hydralazine.       3.  For the atrial fibrillation with supratherapeutic INR,           I will, as I said above, will stop the Coumadin and reverse.       4.  For dyslipidemia, we will continue home medications.       5.  For history of brain aneurysm, we will just monitor           her clinically.       6.  For chronic respiratory failure, I am going to ask the           nurse to request records from primary care physician to see           why exactly she is using  oxygen.       7.  For the odynophagia and possible esophageal thrush, we           will continue her nystatin p.o. swish and swallow.                                            **PRELIMINARY REPORT UNLESS SIGNED**                                SEE DOCUMENT IMAGING SYSTEM FOR FINAL  REPORT                I hereby certify this patient for hospitalization based upon        medical necessity as noted above.                                                      ________________________________                                        Jamison Oka, MD                                                                                                          ID:   [id number]                                                                 DocType:   [id number]       \:   [place]                                                                 /:   854       DD:  04/03/2011 18:44:42                                                DT:  04/03/2011 21:18:01                                                JOB: [id number]                                                            CC:  [facility] ()            [facility] [phone])            PHYSICIAN NO PRIMARY ([id number])  DR NO REFERRAL ([id number])                   >                                                                  Authenticated by Ihor Gully, MD On 04/06/2011 06:31:11 PM

## 2012-09-21 NOTE — Discharge Summary (Signed)
[facility]                               DISCHARGE SUMMARY              NAME: [name], [name]              ADMITTED:   04/03/2011       MR#:  [id number]                            DISCHARGED:       DOB:  07/28/1940                           ACCT#:      [id number]       _________________________________________________________________       PRINCIPAL DIAGNOSES:       1.  Acute on chronic respiratory failure likely from           chronic obstructive pulmonary disease exacerbation, now           resolved.       2.  Hypokalemia.  On potassium supplementation.       3.  Labile hypertension, now better controlled.       4.  Acute blood loss anemia likely from rectal sheath           hematoma from supratherapeutic INR and intractable cough, now           better and off Coumadin.       5.  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              HOSPITAL COURSE:       A 72 year old female who was recently discharged from [facility] with a diagnosis of COPD exacerbation and       pneumonia after being treated with antibiotics and Solu-Medrol       presented to [facility] again with two-day history       of abdominal pain.  Apparently at [facility] the       patient underwent CAT scan abdomen and pelvis that showed       suspected rectal sheath hematoma versus abscess along with       dilated bowel loops.  The patient was also found to have       supratherapeutic INR and at that point she was transferred to       [facility] for higher level of care.  During her       stay at [facility], the patient was empirically       started on IV antibiotics for possible rectal sheath abscess       initially, but later on after consultation with General Surgery,       IV antibiotic was discontinued and the patient was monitored        clinically for improvement in rectal sheath hematoma.  The       patient was kept off Coumadin.  No further intervention was       recommended by General Surgery.  The patient apparently was  also       found to have odynophagia and positive stool occult blood.       Gastroenterology was consulted and the patient underwent upper       GI endoscopy that showed nonspecific gastropathy and esophageal       ulcers.  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  The patient was initially empirically started on       Diflucan that was continued and was then recommended to be also       started on acyclovir for HSV and Helicobacter.  Also during       hospital stay, the patient had acute on chronic respiratory       failure with mild COPD exacerbation that improved with low dose       of Solu-Medrol.  The patient was also found to have hypokalemia       that was repleted with oral potassium supplementation.  The       patient's blood pressure medications were also adjusted to have       better control of the patient's hypertension.  The patient       continued to improve clinically and was finally transferred to       [facility] for further care.              MEDICATIONS AT THE TIME OF DISCHARGE:       Home medications that were continued:              1.  Atenolol 100 mg by mouth once daily.       2.  Calcium plus vitamin D two times a day.              3.  Cardizem was increased from 240 to 300 mg by mouth           once daily.       4.  Crestor 10 mg by mouth once daily.       5.  Diovan 320 mg once daily.       6.  Lasix 40 to 80 mg by mouth once daily, 40 mg every           other day in alternation with 80 mg every other day.       7.  Oxygen 2 liter nasal cannula.       8.  Xalatan eyedrops at bedtime.       9.  Albuterol inhalation as needed.       10.  Clonazepam 0.5 mg 2 times a day.       11.  Percocet 7.5/500 mg three times a day as needed for           pain.       12.  Temazepam  15 mg at bedtime as needed.       13.  Tessalon Perles once daily as needed.              Home medications that were discontinued:              Coumadin, Zantac, K-Dur, prednisone.              New medications at the time of discharge:              1.  Potassium chloride 40 mEq by mouth once daily.       2.  Cardizem CD 300 mg by mouth once daily.       3.  Diflucan 200 mg by mouth once daily for 5 days.       4.  Advair 250/50 mcg 1 puff 2 times a day.       5.  Hydralazine 25 mg by mouth 2 times a day.       6.  Prednisone 10 mg tablet, take 4 tablets by mouth daily           for 2 days and then 3 tablets by mouth once daily for 2 days           and then 2 tablets by mouth once daily for 2 days and then 1           tablet by mouth once daily for 2 days.       7.  Protonix 40 mg 2 times a day.       8.  Acyclovir 400 mg 3 times a day for 10 days.       9.  Aspirin 81 mg by mouth once daily.              The patient is advised to follow up with her PCP for restarting       Coumadin based on her rectal sheath hematoma improvement in the       next two to three weeks.              LABORATORY DATA ON DISCHARGE:       The patient has unremarkable BMP except glucose of 140, sodium       135, potassium 2.8 that was repleted, chloride 95, bicarbonate       32.  The patient has unremarkable CBC except hemoglobin of 9.7           with hematocrit of 28.5.              CONSULTATIONS DURING HOSPITAL STAY:       Dr. Carmelina Noun from Gastroenterology and Dr. Hollice Espy from General       Surgery.              COMPLICATIONS DURING HOSPITAL STAY:       None.              DISCHARGE INSTRUCTIONS:       The patient will be discharged to [facility] bed for       further care.  The patient was recommended to follow up with PCP       in two to three weeks for further medical care and also for       restarting Coumadin in two to three weeks based on her       improvement of her rectal sheath hematoma.  The patient is to be       given a  heart healthy diet and activity as per physical therapy.                                                   **PRELIMINARY REPORT UNLESS SIGNED**                                SEE DOCUMENT IMAGING SYSTEM FOR FINAL REPORT  ________________________________                                        Rufina Falco, MD                                                                                                                    ID:   [id number]                                                          DocType:   [id number]       \:   EG                                                                 /:   [id number]       DD:  04/12/2011 10:33:05                                                DT:  04/12/2011 11:23:19                                                JOB: [id number]                                                            CC:  PCP ()            [facility] ()            PHYSICIAN NO PRIMARY (1002)            DR NO REFERRAL (1000)                   >  Authenticated by Rufina Falco, MD Hospitalist On 04/12/2011 04:54:16 PM

## 2012-10-09 ENCOUNTER — Emergency Department
Admission: EM | Admit: 2012-10-09 | Discharge: 2012-10-09 | Disposition: A | Discharge status: Home / Self Care | Payer: Medicare Other | Attending: Emergency Medicine | Admitting: Emergency Medicine

## 2012-10-09 ENCOUNTER — Emergency Department: Payer: Medicare Other

## 2012-10-09 DIAGNOSIS — I5043 Acute on chronic combined systolic (congestive) and diastolic (congestive) heart failure: Secondary | ICD-10-CM | POA: Insufficient documentation

## 2012-10-09 DIAGNOSIS — I509 Heart failure, unspecified: Secondary | ICD-10-CM | POA: Insufficient documentation

## 2012-10-09 LAB — CBC AND DIFFERENTIAL
Bands: 2 % (ref 0–10)
Basophils Absolute: 0 10*3/uL (ref 0.0–0.3)
Eosinophils %: 1 % (ref 0.0–7.0)
Eosinophils Absolute: 0.1 10*3/uL (ref 0.0–0.8)
Hematocrit: 39.9 % (ref 36.0–48.0)
Hemoglobin: 13.2 gm/dL (ref 12.0–16.0)
Lymphocytes Absolute: 1.9 10*3/uL (ref 0.6–5.1)
Lymphocytes: 29 % (ref 15.0–46.0)
MCH: 31 pg (ref 28–35)
MCHC: 33 gm/dL (ref 31–36)
MCV: 92 fL (ref 80–100)
MPV: 8 fL (ref 6.0–10.0)
Monocytes Absolute: 0.8 10*3/uL (ref 0.1–1.7)
Monocytes: 11 % (ref 3.0–15.0)
Neutrophils %: 57 % (ref 42.0–78.0)
Neutrophils Absolute: 4 10*3/uL (ref 1.7–8.6)
PLT CT: 125 10*3/uL — ABNORMAL LOW (ref 130–440)
RBC: 4.32 10*6/uL (ref 3.80–5.00)
RDW: 11.9 % (ref 10.5–14.5)
WBC: 6.8 10*3/uL (ref 4.00–11.00)

## 2012-10-09 LAB — COMPREHENSIVE METABOLIC PANEL
ALT: 14 U/L (ref 0–55)
AST (SGOT): 23 U/L (ref 10–42)
Albumin/Globulin Ratio: 1.63 Ratio — ABNORMAL HIGH (ref 0.70–1.50)
Albumin: 4.4 gm/dL (ref 3.5–5.0)
Alkaline Phosphatase: 114 U/L (ref 40–145)
Anion Gap: 17.5 mMol/L (ref 7.0–18.0)
BUN / Creatinine Ratio: 32.7 Ratio — ABNORMAL HIGH (ref 10.0–30.0)
BUN: 34 mg/dL — ABNORMAL HIGH (ref 7–22)
Bilirubin, Total: 0.5 mg/dL (ref 0.1–1.2)
CO2: 21 mMol/L (ref 20.0–30.0)
Calcium: 10.7 mg/dL — ABNORMAL HIGH (ref 8.5–10.5)
Chloride: 107 mMol/L (ref 98–110)
Creatinine: 1.04 mg/dL (ref 0.60–1.20)
EGFR: 52 mL/min/{1.73_m2}
Globulin: 2.7 gm/dL (ref 2.0–4.0)
Glucose: 98 mg/dL (ref 70–99)
Osmolality Calc: 289 mOsm/kg (ref 275–300)
Potassium: 4.5 mMol/L (ref 3.5–5.3)
Protein, Total: 7.1 gm/dL (ref 6.0–8.3)
Sodium: 141 mMol/L (ref 136–147)

## 2012-10-09 LAB — PT/INR
PT INR: 1.7 — ABNORMAL HIGH (ref 0.8–1.2)
PT: 18.1 s — ABNORMAL HIGH (ref 9.4–12.5)

## 2012-10-09 LAB — TROPONIN I
Troponin I: 0.03 ng/mL — ABNORMAL HIGH (ref 0.00–0.02)
Troponin I: 0.02 ng/mL (ref 0.00–0.02)

## 2012-10-09 LAB — B-TYPE NATRIURETIC PEPTIDE: B-Natriuretic Peptide: 664.2 pg/mL — ABNORMAL HIGH (ref 0.0–100.0)

## 2012-10-09 LAB — CK: Creatine Kinase (CK): 102 U/L (ref 30–189)

## 2012-10-09 MED ORDER — HYDROCODONE-ACETAMINOPHEN 5-325 MG PO TABS
1.0000 | ORAL_TABLET | Freq: Once | ORAL | Status: AC
Start: 2012-10-09 — End: 2012-10-09
  Administered 2012-10-09: 1 via ORAL

## 2012-10-09 MED ORDER — FUROSEMIDE 10 MG/ML IJ SOLN
40.0000 mg | Freq: Once | INTRAMUSCULAR | Status: AC
Start: 2012-10-09 — End: 2012-10-09
  Administered 2012-10-09: 40 mg via INTRAVENOUS

## 2012-10-09 NOTE — ED Notes (Signed)
Pt returned from Xray complaining for bilateral leg pain. Pt states she has a history of restless leg. Dr. Alexis Goodell advised. Verbal order received. See MAR for intervention

## 2012-10-09 NOTE — ED Notes (Signed)
Patient is resting comfortably. 

## 2012-10-09 NOTE — Discharge Instructions (Signed)
Take 60mg  ( 1 1/2) tablets of furosemide each morning for 2 days.  Be rechecked in 1 day.  Return if worse  Discharge Instructions for Heart Failure  You have been diagnosed with heart failure. The term "heart failure" sounds scary because it suggests the heart is no longer working. But, it actually means the heart isn't doing its job as well as it should. Heart failure happens when your heart muscle can't keep up with your body's need for blood flow. Symptoms of heart failure can be controlled by changes in your lifestyle and by following your doctor's advice.  Home care  Activity  Ask your health care provider about an exercise program. You can benefit from simple activities such as walking or gardening. Exercising most days of the week can make you feel better. Don't be discouraged if your progress is slow at first. Rest as needed and stop activity if you develop symptoms such s chest pain, lightheadedness, or significant shortness of breath.  Diet  Follow aheart healthy diet and work hard to remove salt from your diet. Try to limit total salt/sodium intake to 2000 mg a day. Salt causes your body to retain water, which can make it harder for your heart to pump. You can start limiting salt by doing the following:   Limit canned, dried, packaged, and fast foods.   Don't add salt to your food at the table.   Season foods with herbs instead of salt when you cook.  Reduce your fluid intake. Drinking too much fluid can make heart failure worse. It is commonly advised to limit total fluid intake to less than 66 ounces (2 liters) per day.  Limit alcohol. Too much alcohol can be harmful to the heart. Alcohol should be limited to no more than one serving a day for women and two servings a day for men.  Tobacco  If you smoke, you'll need to quit. Smoking increases your chances of having a heart attack, which makes heart failure worse. Quitting smoking is the number one thing you can do to improve your health. Enroll in  a stop-smoking program to improve your chances of success. Talk to your doctor about medications or nicotine replacement therapy to help you quit smoking.  Medication  Take your medications exactly as prescribed. Learn the names and purpose of each of your medications. Keep an accurate medication list and current dosages with you at all times. Don't skip doses. If you miss a dose of your medication, take it as soon as you remember -- unless it's almost time for your next dose. In that case, just wait and take your next dose at the normal time. Don't take a double dose. If you are unsure, call your doctor's office.  Weight monitoring  Weigh yourself every day. A sudden weight gain can indicate your heart failure is worsening. Weight yourself at the same time of day and in the same kind of clothes. Ideally, weight yourself first thing in the morning after you empty your bladder, but before you eat breakfast. Your health care provider will show you how to track your weight. He or she will also discuss with you when you should call if you have a sudden, unexpected increase in your weight.  In general, your health care provider may ask you to report if your weight increases by more than 2 pounds in 1 day or 5 pounds in 1 week, or whatever weight gain you were told by your doctor. This is a sign  that you are retaining more fluid than you should be.  Follow-up care  Make a follow up appointment as directed. Depending on the type and severity of heart failure you have, you may require follow up as early as 7 days from hospital discharge. Keep appointments for checkups and lab tests that are needed to check your medications and condition.  Recognize that your health and even survival depend on your following medical recommendations.  Symptoms  Heart failure can cause a variety of symptoms, including the following:   Shortness of breath   Difficulty breathing at night   Swelling in the legs and feet or in the  abdomen   Becoming easily fatigues   Irregular or rapid heartbeat   Weakness or lightheadedness  It is important to know what to do if symptoms worsen or if you develop signs of worsening heart failure.    When to call your doctor  Call your doctor right away if you have any of the following signs of worsening heart failure:   Sudden weight gain (more than 2 pounds in 1 day or 5pounds in 1 week, or whatever weight gain you were told to report by your doctor)   Trouble breathing not related to being active   New or increased swelling of your legs or ankles   Swelling or pain in your abdomen   Breathing trouble at night (waking up short of breath, needing more pillows to breathe)   Frequent coughing that doesn't go away   Feeling much more tired than usual  When to seek emergency medical attention  Call 911 right away if you develop:   Severe shortness of breath, such that you can't catch your breath even resting   Severe shortness of breath   Severe chest pain that does not resolve with rest or nitroglycerin   Pink, foamy mucus with cough and shortness of breath   A continuous rapid or irregular heartbeat   Passing out or fainting   Stroke symptoms such as sudden numbness or weakness on one side of your face, arm, or leg or sudden confusion, trouble speaking or vision changes      [address], [place], [place] [address]. All rights reserved. This information is not intended as a substitute for professional medical care. Always follow your healthcare professional's instructions.      Heart Failure (Left Or Right Sided)    The heart is a large muscle that pumps blood throughout the body. Blood carries oxygen to all the organs, muscles, and skin of your body. After the body takes the oxygen out of the blood, the blood returns to the heart. The right side of the heart collects that blood and pumps it to the lungs to receive fresh oxygen. This oxygen-rich blood from the  lungs then returns to the left side of the heart where it is pumped back out to the rest of the body, starting the process all over.  Heart Failure (HF) occurs when the heart muscle is weakened. This affects the pumping action of the heart.  When the right side of the heart is weakened, it can't handle the blood it is receiving from the rest of the body. This blood returns to the heart through veins. When too much pressure builds up in the veins fluid leaks out into the tissues. Gravity then causes that fluid to spread to those parts of the body that are the lowest. Therefore, one of the first symptoms of HF  include swelling in the feet and ankles. If the condition worsens, the swelling can even go up past the knees.  When the left side of the heart is weakened, it can't handle the blood it is receiving from the lungs. Pressure then builds up in the veins of the lungs, causing fluid to leakinto the lung tissues. This may be referred to as congestive heart failure.This causes you to feel short of breath, weak, or dizzy. These symptoms are often worse with exertion, such as climbing stairs or walking up hills. Lying flat is uncomfortable and can make your breathing worse. This may make sleeping difficult and force you to useextra pillows to sleep well.  This condition may not only affect the right side of the heart or only the left side. While it may have started on one side, it often affects both sides.  Causes of heart failure   Coronary artery disease   Prior heart attack (also known as acute myocardial infarction, or AMI)   High blood pressure   Damaged heart valve   Diabetes   Obesity   Cigarette smoking   Alcohol abuse  Treatment  Heart failure is a chronic condition. There is no cure. The purpose of medical treatment is to improve the pumping action of the heart, and remove excess water from the body. A number of medications can help achieve this goal,improvesymptoms and prevent the heart from  becoming weaker. Another major goal is to better treat the caues of heart failure, such as diabetes, high blood pressure, and your lifestyle.  Home care   Check your weight every day. A sudden increase in weight gain could mean worsening heart failure.   Use the same scale every day   Weigh yourself at the same time every day   Make sure the scale is on the floor, not on a rug   Keep a record of your weight every day, so your doctor can see it. If you are not given a log sheet for this, keep a separate journal for this purpose.   Reduce your salt (sodium) intake.   Avoid high-salt foods (olives, pickles, smoked meats, salted potato chips, etc.).   Do not add salt to your food at the table and use only small amounts of salt when cooking.     Follow your doctors recommendations about how much fluid intake is safe.   Stop smoking.   Reduce alcohol use.   Lose weight if you are overweight. The excess weight adds a lot of stress on the workload of the heart.   Stay active. Talk to your doctor about an exercise program that is safe for your heart.   Keep your feet elevated to reduce swelling. Ask your doctor about support hose as a preventive treatment for daytime leg swelling.  Besides taking your medicine as instructed, an important part of treatment includes lifestyle changes such as diet, physical activity, stopping smoking, and weight control.  Improve your diet. Often in the hospital, people are given a "heart healthy diet." This includes more fresh foods, lower fat, less processed foots, and lower salt.  Follow-up care   Follow up with your doctor as directed by our staff.   Make sure to keep any appointments that were made for you as this can help better control heart failure.   If an X-ray was done, you will be notified of any new findings that may affect your care.  Call 911  Call 911 if you:   Become  severely short of breath   Feel lightheaded, or feel like you might pass out or  faint   Have chest pain or discomfort whic is different than usual, the medicines your doctor told you to use for this do not help, or the pain lasts longer than 10 to 15 minutes   Suddendly develop a rapid heart rate  When to seek medical care  Get prompt medical attention if you have any of the following signs of worsening heart failure:   Sudden weight gain (3or more pounds in one day or5or more pounds in one week)   Trouble breathing not related to being active   New or increased swelling of your legs or ankles   Swelling or pain in your abdomen   Breathing trouble at night (waking up short of breath, needing more pillows to breathe)   Frequent coughing that doesn't go away   Feeling much more tired than usual   [address], [place], [place] [address]. All rights reserved. This information is not intended as a substitute for professional medical care. Always follow your healthcare professional's instructions.

## 2012-10-09 NOTE — ED Notes (Signed)
Unable to draw off peripheral IV. SIte flushed. Patent. Lab advised.

## 2012-10-09 NOTE — ED Notes (Signed)
Pt ambulated to bathroom with minimal assistance. Pt rpt her legs feel better after taking the pain medications. Dr. Marcello Moores called and advises he would like to repeat pts cardiac enzymes at 0600.

## 2012-10-09 NOTE — ED Notes (Signed)
Vital signs stable. 

## 2012-10-09 NOTE — ED Notes (Signed)
Dr. Marcello Moores advised of pts labs resulted. Verbal order received for med administration. See MAR. Pt resting comfortably. Additional blanket provided. VSS. Call bell in reach.

## 2012-10-09 NOTE — ED Notes (Signed)
Pts daughter spoke with Loretta Plume, RN. Daughter advised of Plan of Care to repeat cardiac enzymes.

## 2012-10-09 NOTE — ED Provider Notes (Addendum)
Physician/Midlevel provider first contact with patient: 10/09/12 0109         History     Chief Complaint   Patient presents with   . Shortness of Breath     The history is provided by the patient.   c/o of 1 week of increasing sob.  NO ch.p. Diaphoresis, n/v. No fever or prod cough or fever. No neck back or arm pain.    Past Medical History   Diagnosis Date   . Atrial fibrillation        No past surgical history on file.    No family history on file.    Social  History   Substance Use Topics   . Smoking status: Not on file   . Smokeless tobacco: Not on file   . Alcohol Use:        .     Allergies   Allergen Reactions   . Codeine    . Morphine    . Nitroglycerin    . Pain Patch (Menthol)        Current/Home Medications    ATENOLOL (TENORMIN) 100 MG TABLET    Take 100 mg by mouth daily.    BENZONATATE (TESSALON) 200 MG CAPSULE    Take 200 mg by mouth 3 (three) times daily as needed.    CALCIUM CARBONATE (OS-CAL) 600 MG TAB TABLET    Take 600 mg by mouth 2 (two) times daily with meals.    CLONAZEPAM (KLONOPIN) 0.5 MG TABLET    Take 0.5 mg by mouth 2 (two) times daily as needed.    DILTIAZEM (TIAZAC) 240 MG 24 HR CAPSULE    Take 240 mg by mouth daily.    ESTRADIOL (ESTRACE) 0.1 MG/GM VAGINAL CREAM    Place 2 g vaginally daily.    FUROSEMIDE (LASIX) 40 MG TABLET    Take 40 mg by mouth 2 (two) times daily.    HYDROCODONE-ACETAMINOPHEN (NORCO) 5-325 MG PER TABLET    Take 1 tablet by mouth every 6 (six) hours as needed.    KETOCONAZOLE (NIZORAL) 2 % CREAM    Apply topically daily.    OMEPRAZOLE (PRILOSEC) 20 MG CAPSULE    Take 20 mg by mouth daily.    POTASSIUM CHLORIDE (KLOR-CON) 20 MEQ PACKET    Take 20 mEq by mouth 2 (two) times daily.    RANITIDINE (ZANTAC) 150 MG TABLET    Take 150 mg by mouth 2 (two) times daily.    ROSUVASTATIN (CRESTOR) 10 MG TABLET    Take 10 mg by mouth daily.    SPIRONOLACTONE (ALDACTONE) 50 MG TABLET    Take 50 mg by mouth daily.    WARFARIN (COUMADIN) 2.5 MG TABLET    Take 2.5 mg by mouth  daily.        Review of Systems   Constitutional: Negative.    HENT: Negative.    Eyes: Negative.    Respiratory: Positive for shortness of breath. Negative for choking, chest tightness, wheezing and stridor.    Cardiovascular: Negative for chest pain, palpitations and leg swelling.   Gastrointestinal: Negative.  Negative for nausea and vomiting.   Genitourinary: Negative.    Musculoskeletal: Negative.    Neurological: Negative for dizziness, syncope, weakness, light-headedness and numbness.   Psychiatric/Behavioral: Negative.        Physical Exam    BP 129/73  Pulse 83  Temp 95.5 F (35.3 C)  Resp 16  Ht 1.6 m  Wt 77.111 kg  BMI 30.12  kg/m2  SpO2 99%    Physical Exam   Constitutional: She is oriented to person, place, and time. No distress.   HENT:   Head: Normocephalic and atraumatic.   Eyes: Pupils are equal, round, and reactive to light.   Neck: Normal range of motion. Neck supple.   Cardiovascular: Normal rate and regular rhythm.    Pulmonary/Chest: No respiratory distress. She has no wheezes. She has rales (both bases). She exhibits no tenderness.   Abdominal: Soft. There is no tenderness.   Musculoskeletal: She exhibits edema (1 plus bilat ankle edema).   Neurological: She is alert and oriented to person, place, and time. She has normal reflexes. No cranial nerve deficit.   Skin: Skin is warm and dry. She is not diaphoretic.       MDM and ED Course     ED Medication Orders      Start     Status Ordering Provider    10/09/12 0259   furosemide (LASIX) injection 40 mg   Once      Route: Intravenous  Ordered Dose: 40 mg         Last MAR action:  Given Johney Maine    10/09/12 0218   HYDROcodone-acetaminophen (NORCO) 5-325 MG per tablet 1 tablet   Once      Route: Oral  Ordered Dose: 1 tablet         Last MAR action:  Given Johney Maine                 MDM  Number of Diagnoses or Management Options  Acute on chronic combined systolic and diastolic CHF (congestive heart failure):   Diagnosis  management comments: Pt. With hx of at.fib and probable chf c/o 1week of inc. Sob.  NO chp.  ekg showed controlled v.resp with diffuse changes consistent with LVH.  First trop 0.3 4hr later was 0.2.  BNP over 600.   After single dose of 40mg  IV lasix her lungs cleared up and she felt much better       Amount and/or Complexity of Data Reviewed  Clinical lab tests: reviewed  Tests in the radiology section of CPT: reviewed    Patient Progress  Patient progress: improved        Procedures    Clinical Impression & Disposition     Clinical Impression  Final diagnoses:   Acute on chronic combined systolic and diastolic CHF (congestive heart failure)        ED Disposition     Discharge [name] Kleen discharge to home/self care.    Condition at discharge: Good             New Prescriptions    No medications on file               Johney Maine, MD  10/09/12 1610    Johney Maine, MD  10/09/12 [phone]

## 2012-10-09 NOTE — ED Notes (Signed)
Pt ambulated to Bathroom. No distress noted, Pt rpts she doesn't feel as short of breath as she did upon arrival.

## 2012-10-09 NOTE — ED Notes (Signed)
Lab Tech at bedside for blood draw

## 2012-10-09 NOTE — ED Notes (Signed)
PT TO HOME D/C INSTRUCTIONS GIVEN WITH VERBAL UNDERSTANDING.

## 2012-10-16 ENCOUNTER — Ambulatory Visit (INDEPENDENT_AMBULATORY_CARE_PROVIDER_SITE_OTHER): Payer: Medicare Other | Admitting: Family

## 2012-10-16 DIAGNOSIS — I4891 Unspecified atrial fibrillation: Secondary | ICD-10-CM

## 2012-10-16 LAB — POCT INR: INR: 2.2

## 2012-10-16 NOTE — Patient Instructions (Addendum)
Continue coumadin at same dose. Recheck with PCP in 3 weeks.   Anticoagulation Dose Instructions as of 10/16/2012     Beth Greer Tue Wed Thu Fri Sat   New Dose 1.25 mg 2.5 mg 1.25 mg 2.5 mg 1.25 mg 2.5 mg 1.25 mg    Description       Continue coumadin at same dose. Recheck with PCP in 3 weeks.

## 2012-12-04 ENCOUNTER — Other Ambulatory Visit: Payer: Self-pay

## 2013-01-09 ENCOUNTER — Telehealth: Payer: Self-pay | Admitting: General Practice

## 2013-01-09 NOTE — Telephone Encounter (Signed)
LMOM.  Following up with patient to see if she is still taking coumadin and to find out who is following INR.

## 2013-02-07 ENCOUNTER — Ambulatory Visit (INDEPENDENT_AMBULATORY_CARE_PROVIDER_SITE_OTHER): Payer: Medicare Other | Admitting: General Practice

## 2013-02-07 DIAGNOSIS — I4891 Unspecified atrial fibrillation: Secondary | ICD-10-CM

## 2013-03-03 ENCOUNTER — Other Ambulatory Visit: Payer: Self-pay | Admitting: Internal Medicine

## 2013-03-06 ENCOUNTER — Other Ambulatory Visit: Payer: Self-pay

## 2013-03-07 ENCOUNTER — Ambulatory Visit
Admission: RE | Admit: 2013-03-07 | Discharge: 2013-03-07 | Disposition: A | Discharge status: Home / Self Care | Payer: Medicare Other | Source: Ambulatory Visit | Attending: Internal Medicine | Admitting: Internal Medicine

## 2013-03-07 DIAGNOSIS — Z1231 Encounter for screening mammogram for malignant neoplasm of breast: Secondary | ICD-10-CM | POA: Insufficient documentation

## 2013-05-21 IMAGING — CT CT ANGIO HEAD
1 of 12 series · 4 of 33 positions shown · IV contrast (CONTRAST)
Comparison: CT head 02/11/2012

CLINICAL DATA: Rule out hemorrhage or aneurysm.  Abnormal CT head

CT ANGIOGRAPHY HEAD
TECHNIQUE: Multidetector CT imaging of the head was performed
using the standard protocol during bolus administration of
intravenous contrast.  Multiplanar CT image reconstructions
including MIPs were obtained to evaluate the vascular anatomy.
Contrast: 50mL OMNIPAQUE IOHEXOL 350 MG/ML SOLN

[mpr, ax 1x1 mpr, axial · axial · 0.43mm/px · z∈[+292,+382]mm · 4 of 152 slices shown]
[im 31/152  soft-tissue]
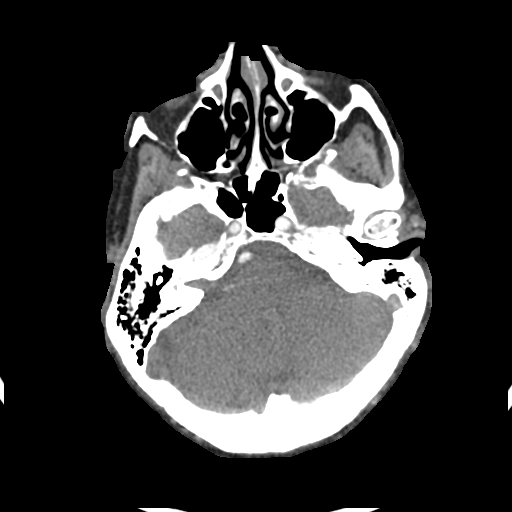
[im 61/152  bone]
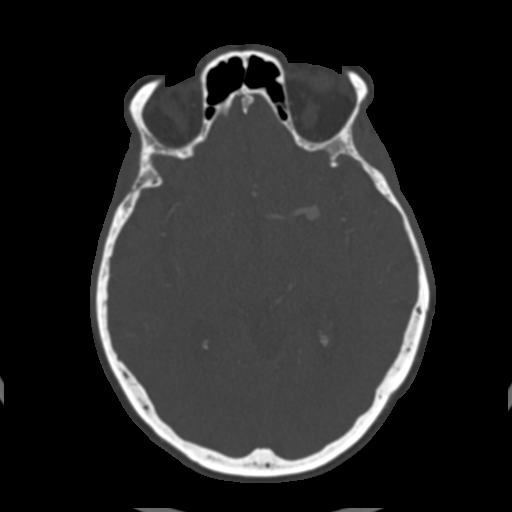
[im 91/152  soft-tissue]
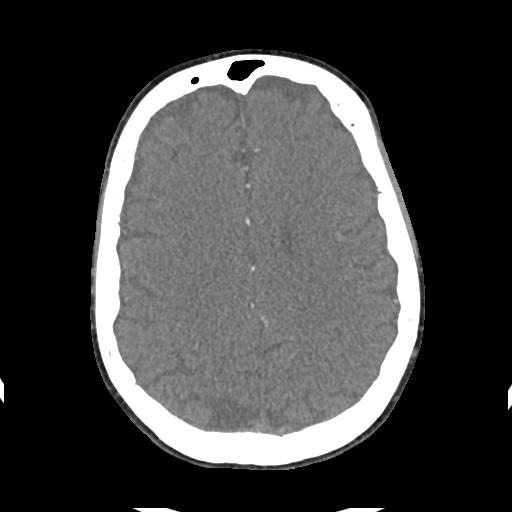
[im 121/152  bone]
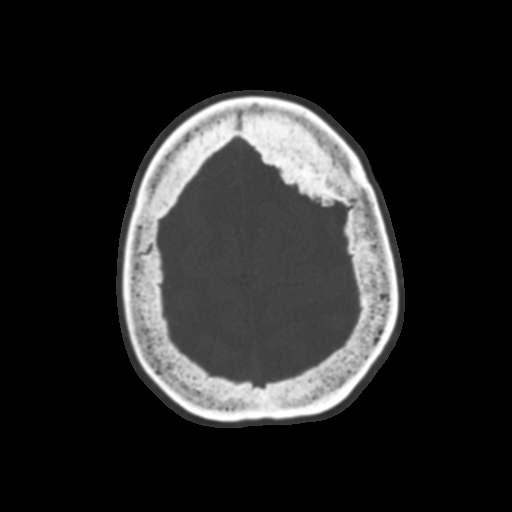

[4 of 33 positions shown; findings below may reference images not displayed]

FINDINGS: Hyperdense area in the region of the left middle
cerebral artery shows enhancement compatible with an aneurysm.
This corresponds to the abnormality on the prior CT.  Negative for
intracranial hemorrhage.  There is atrophy and mild chronic
microvascular ischemia.  No acute infarct or mass.

5.5 x 7.3 mm aneurysm of the left middle cerebral artery
bifurcation.  The aneurysm projects superiorly. There is
irregularity of the dome of the aneurysm suggesting an area of
weakening of the aneurysm wall.

No other aneurysm is identified.

Left vertebral artery is dominant and patent to the basilar.
Hypoplastic distal right vertebral artery which does contribute to
the basilar.  The basilar is widely patent.  Patent posterior
communicating artery bilaterally with hypoplastic   P1 segment
bilaterally.  Both posterior cerebral arteries are patent.

Cavernous carotid is patent bilaterally without stenosis or
aneurysm.  Anterior and middle cerebral arteries are patent
bilaterally without evidence of stenosis.

 Review of the MIP images confirms the above findings.
IMPRESSION: 5.5 x 7.3 mm aneurysm of the left middle cerebral artery
bifurcation.  The aneurysm projects superiorly.  There is
irregularity of the dome of the aneurysm which may indicate an area
of weakening.  Negative for intracranial hemorrhage.  No other
aneurysm is identified.

## 2013-06-29 ENCOUNTER — Emergency Department: Payer: Medicare Other

## 2013-06-29 ENCOUNTER — Emergency Department
Admission: EM | Admit: 2013-06-29 | Discharge: 2013-06-30 | Disposition: A | Discharge status: Home / Self Care | Payer: Medicare Other | Attending: Emergency Medicine | Admitting: Emergency Medicine

## 2013-06-29 DIAGNOSIS — K5900 Constipation, unspecified: Secondary | ICD-10-CM | POA: Insufficient documentation

## 2013-06-29 MED ORDER — FLEET ENEMA 7-19 GM/118ML RE ENEM
1.00 | ENEMA | Freq: Once | RECTAL | Status: AC
Start: 2013-06-29 — End: 2013-06-29
  Administered 2013-06-29: 1 via RECTAL

## 2013-06-29 MED ORDER — MAGNESIUM CITRATE 1.745 GM/30ML PO SOLN
296.0000 mL | Freq: Once | ORAL | Status: AC
Start: 2013-06-29 — End: 2013-06-30
  Administered 2013-06-30: 296 mL via ORAL

## 2013-06-29 NOTE — ED Provider Notes (Signed)
Physician/Midlevel provider first contact with patient: 06/29/13 2214       [facility]   EMERGENCY DEPARTMENT HISTORY AND PHYSICAL EXAM    Date: 06/29/2013  Patient Name: [name]  Attending Physician: Kirkland Hun, MD  Patient DOB:  [birth year]  MRN:  [id number]  Room:  ED3/ED3-A    History     Chief Complaint   Patient presents with   . Constipation     last BM was 2/25. pt states she has taken miralax and colace with no relief.      HPI Comments: Pt with c/o constipation.  Pt last BM was about 5 days ago.  Pt with urge but no movement.  Pt has tried miralax and colace with out change.  Pt with associate nausea today but no emesis,  Pt denies abd pain.  No aggravate or alleviate factors.    Patient is a 73 y.o. female presenting with constipation. The history is provided by the patient.   Constipation   The current episode started 3 to 5 days ago. The problem occurs continuously. The problem has been unchanged. The patient is experiencing no pain. The stool is described as hard. Prior unsuccessful therapies include laxatives and stool softeners. Associated symptoms include nausea. Pertinent negatives include no anorexia, no fever, no abdominal pain, no diarrhea, no hemorrhoids, no vomiting, no chest pain, no headaches, no coughing and no rash.       Past Medical History   Diagnosis Date   . Atrial fibrillation    . Malignant neoplasm        Past Surgical History   Procedure Date   . Hysterectomy        Family History   Problem Relation Age of Onset   . Breast cancer Sister        Social  History   Substance Use Topics   . Smoking status: Not on file   . Smokeless tobacco: Not on file   . Alcohol Use:        .     Allergies   Allergen Reactions   . Codeine    . Morphine    . Nitroglycerin    . Pain Patch (Menthol)        Current/Home Medications    ATENOLOL (TENORMIN) 100 MG TABLET    Take 100 mg by mouth daily.    BENZONATATE (TESSALON) 200 MG CAPSULE    Take 200 mg by mouth 3 (three) times  daily as needed.    CALCIUM CARBONATE (OS-CAL) 600 MG TAB TABLET    Take 600 mg by mouth 2 (two) times daily with meals.    CLONAZEPAM (KLONOPIN) 0.5 MG TABLET    Take 0.5 mg by mouth 2 (two) times daily as needed.    DILTIAZEM (TIAZAC) 240 MG 24 HR CAPSULE    Take 240 mg by mouth daily.    ESTRADIOL (ESTRACE) 0.1 MG/GM VAGINAL CREAM    Place 2 g vaginally daily.    FUROSEMIDE (LASIX) 40 MG TABLET    Take 40 mg by mouth 2 (two) times daily.    HYDROCODONE-ACETAMINOPHEN (NORCO) 5-325 MG PER TABLET    Take 1 tablet by mouth every 6 (six) hours as needed.    KETOCONAZOLE (NIZORAL) 2 % CREAM    Apply topically daily.    OMEPRAZOLE (PRILOSEC) 20 MG CAPSULE    Take 20 mg by mouth daily.    POTASSIUM CHLORIDE (KLOR-CON) 20 MEQ PACKET    Take 20 mEq  by mouth 2 (two) times daily.    RANITIDINE (ZANTAC) 150 MG TABLET    Take 150 mg by mouth 2 (two) times daily.    ROSUVASTATIN (CRESTOR) 10 MG TABLET    Take 10 mg by mouth daily.    SPIRONOLACTONE (ALDACTONE) 50 MG TABLET    Take 50 mg by mouth daily.    WARFARIN (COUMADIN) 2.5 MG TABLET    Take 2.5 mg by mouth daily.        Review of Systems   Constitutional: Negative for fever, chills, activity change and appetite change.   HENT: Negative for congestion, ear pain, rhinorrhea and sore throat.    Eyes: Negative for visual disturbance.   Respiratory: Negative for cough and shortness of breath.    Cardiovascular: Negative for chest pain.   Gastrointestinal: Positive for nausea and constipation. Negative for vomiting, abdominal pain, diarrhea, anorexia and hemorrhoids.   Musculoskeletal: Negative for arthralgias, back pain and neck pain.   Skin: Negative for rash and wound.   Neurological: Negative for numbness and headaches.       Physical Exam    BP: 138/68 mmHg, Heart Rate: 62 , Temp: 97.8 F (36.6 C), Resp Rate: 16 , SpO2: 98 %, Weight: 77.111 kg    Physical Exam   Nursing note and vitals reviewed.  Constitutional: She is oriented to person, place, and time. She appears  well-developed and well-nourished. She appears distressed.   HENT:   Head: Normocephalic and atraumatic.   Eyes: Conjunctivae normal and EOM are normal. Pupils are equal, round, and reactive to light. Right eye exhibits no discharge. Left eye exhibits no discharge. No scleral icterus.   Neck: Normal range of motion. Neck supple.   Cardiovascular: Normal rate, regular rhythm and normal heart sounds.  Exam reveals no gallop and no friction rub.    No murmur heard.  Pulmonary/Chest: Effort normal and breath sounds normal. No respiratory distress. She has no wheezes. She has no rales.   Abdominal: Soft. She exhibits no distension and no mass. There is no tenderness. There is no rebound and no guarding.   Neurological: She is alert and oriented to person, place, and time.   Skin: Skin is warm and dry. No rash noted. She is not diaphoretic. No erythema.   Psychiatric: She has a normal mood and affect. Her behavior is normal.       MDM and ED Course     ED Medication Orders      Start     Status Ordering Provider    06/29/13 2341   magnesium citrate (CITROMA) solution 296 mL   Once      Route: Oral  Ordered Dose: 296 mL         Ordered Jerrin Recore, Earl Lites W    06/29/13 2254   sodium phosphate (FLEET) enema 1 enema   Once      Route: Rectal  Ordered Dose: 1 enema         Oneida Alar W                 Radiologic Studies  Radiology Results (24 Hour)     Procedure Component Value Units Date/Time    XR Abdomen 2 View With CXR [[id number]] Collected:06/29/13 2330    Order Status:Completed  Updated:06/29/13 2335    Narrative:    Clinical History:  constipation    Examination:  Frontal view of the chest with supine and erect views of the abdomen.    Comparison:  Chest 2 views October 09, 2012 and correlations made with April 05, 2011 CT Abdomen and Pelvis.    Findings:  Supine and upright views of the abdomen show no evidence of free intraperitoneal air. Moderate to large amount of stool  throughout the colon and rectum.  Scattered gas and normal caliber small bowel loops. Lumbar scoliosis convex towards the  left. Hypertrophic and degenerative changes of the thoracolumbar spine. Aortoiliac calcifications. Pessary.    Marked cardiac enlargement. 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 Chronic elevation of left  hemidiaphragm. Lungs grossly clear.      Impression:    1.  Moderate-to-large amount of stool throughout colon.  2.  No acute findings in the chest. Chest unchanged since October 09, 2012.    ReadingStation:[id number]      .          MDM    Manuel disimpaction done,  Soft stool, no mass noted.  Pt to take citrate at home and f/u      Procedures    Clinical Impression & Disposition     Clinical Impression  Final diagnoses:   Constipation        ED Disposition     Discharge [facility] discharge to home/self care.    Condition at disposition: Stable             New Prescriptions    No medications on file                 Chucky May, MD  06/29/13 2344

## 2013-06-30 NOTE — Discharge Instructions (Signed)
Treating Constipation  Constipation is a common and often uncomfortable problem. Constipation means you have bowel movementsfewer than three times per week,or strain to pass hard, dry stool. It can last a short time. Or it can be a problem that never seems to go away. The good news is that it can often be treated and controlled.    Eat More Fiber  One of the best ways to help treat constipation is to increase your fiber intake. You can do this either through diet or by using fiber supplements. Fiber (in whole grains, fruits, and vegetables) adds bulk and absorbs water to soften the stool. This helps the stool pass through the colon more easily. When you increase your fiber intake, do it slowly to avoid side effects such as bloating. Also increase the amount of water that you drink, too. Eating more of the following foods can add fiber to your diet.   High-fiber cereals   Whole grains, bran, and brown rice   Vegetables such as carrots, broccoli, and greens   Fresh fruits (especially apples, pears, and dried fruits like raisins and apricots)   Nuts and legumes (especially beans such as lentils, kidney beans, and lima beans)  Get Physically Active  Exercise helps improve the working of your colon. This helps ease constipation. Try to get some activity every day. If you haven't been active for a while, talk to yourhealth care providerbefore starting again.  AvoidOveruse ofLaxatives  Some laxatives stimulate the colon to work more quickly. However, using laxatives too often can lead to dependency If you use laxatives often, talk to the doctor. He or she can help you ease off of them. And don't use stimulant laxatives without talking to your health care provider first.  Note: Yourhealth care providermay suggest an over-the-counter product to help ease your constipation. If so, follow directions carefully when using it.    2000-2014 [employer], [address], [place] [address]. All rights  reserved. This information is not intended as a substitute for professional medical care. Always follow your healthcare professional's instructions.

## 2013-06-30 NOTE — ED Notes (Signed)
Significant amount of stool removed from rectum by Dr. Freida Busman. Pt rpts relief.

## 2013-06-30 NOTE — ED Notes (Signed)
Dr. Freida Busman at bedside to perform disimpaction

## 2013-06-30 NOTE — ED Notes (Signed)
Discharge instructions given. Pt verbalized understanding. PT assisted to wheelchair out to lobby in care of daughter. Discharge instructions explained to daughter.

## 2013-07-24 ENCOUNTER — Other Ambulatory Visit: Payer: Self-pay | Admitting: Internal Medicine

## 2013-07-24 ENCOUNTER — Ambulatory Visit
Admission: RE | Admit: 2013-07-24 | Discharge: 2013-07-24 | Disposition: A | Discharge status: Home / Self Care | Payer: Medicare Other | Source: Ambulatory Visit | Attending: Internal Medicine | Admitting: Internal Medicine

## 2013-07-24 DIAGNOSIS — R52 Pain, unspecified: Secondary | ICD-10-CM

## 2013-10-13 ENCOUNTER — Other Ambulatory Visit
Admission: RE | Admit: 2013-10-13 | Discharge: 2013-10-13 | Disposition: A | Discharge status: Home / Self Care | Payer: BC Managed Care – PPO | Source: Ambulatory Visit | Attending: Internal Medicine | Admitting: Internal Medicine

## 2013-10-13 LAB — PT/INR
PT INR: 1.3 (ref 0.5–1.3)
PT: 13.6 s — ABNORMAL HIGH (ref 9.5–11.5)

## 2013-11-05 ENCOUNTER — Emergency Department: Payer: Medicare Other

## 2013-11-05 ENCOUNTER — Observation Stay: Payer: Medicare Other | Admitting: Internal Medicine

## 2013-11-05 ENCOUNTER — Observation Stay
Admission: EM | Admit: 2013-11-05 | Discharge: 2013-11-07 | Disposition: A | Discharge status: Home / Self Care | Payer: Medicare Other | Attending: Internal Medicine | Admitting: Internal Medicine

## 2013-11-05 DIAGNOSIS — I359 Nonrheumatic aortic valve disorder, unspecified: Secondary | ICD-10-CM | POA: Insufficient documentation

## 2013-11-05 DIAGNOSIS — I517 Cardiomegaly: Secondary | ICD-10-CM | POA: Insufficient documentation

## 2013-11-05 DIAGNOSIS — Z950 Presence of cardiac pacemaker: Secondary | ICD-10-CM | POA: Insufficient documentation

## 2013-11-05 DIAGNOSIS — I509 Heart failure, unspecified: Secondary | ICD-10-CM | POA: Diagnosis present

## 2013-11-05 DIAGNOSIS — Z803 Family history of malignant neoplasm of breast: Secondary | ICD-10-CM | POA: Insufficient documentation

## 2013-11-05 DIAGNOSIS — Z7901 Long term (current) use of anticoagulants: Secondary | ICD-10-CM | POA: Insufficient documentation

## 2013-11-05 DIAGNOSIS — Z888 Allergy status to other drugs, medicaments and biological substances status: Secondary | ICD-10-CM | POA: Insufficient documentation

## 2013-11-05 DIAGNOSIS — R079 Chest pain, unspecified: Secondary | ICD-10-CM | POA: Insufficient documentation

## 2013-11-05 DIAGNOSIS — Z90710 Acquired absence of both cervix and uterus: Secondary | ICD-10-CM | POA: Insufficient documentation

## 2013-11-05 DIAGNOSIS — I719 Aortic aneurysm of unspecified site, without rupture: Secondary | ICD-10-CM | POA: Insufficient documentation

## 2013-11-05 DIAGNOSIS — R0602 Shortness of breath: Secondary | ICD-10-CM | POA: Insufficient documentation

## 2013-11-05 DIAGNOSIS — I079 Rheumatic tricuspid valve disease, unspecified: Secondary | ICD-10-CM | POA: Insufficient documentation

## 2013-11-05 DIAGNOSIS — I4821 Permanent atrial fibrillation: Secondary | ICD-10-CM | POA: Diagnosis not present

## 2013-11-05 DIAGNOSIS — I4891 Unspecified atrial fibrillation: Principal | ICD-10-CM | POA: Insufficient documentation

## 2013-11-05 DIAGNOSIS — R059 Cough, unspecified: Secondary | ICD-10-CM | POA: Insufficient documentation

## 2013-11-05 DIAGNOSIS — I10 Essential (primary) hypertension: Secondary | ICD-10-CM | POA: Insufficient documentation

## 2013-11-05 DIAGNOSIS — Z885 Allergy status to narcotic agent status: Secondary | ICD-10-CM | POA: Insufficient documentation

## 2013-11-05 DIAGNOSIS — Z8589 Personal history of malignant neoplasm of other organs and systems: Secondary | ICD-10-CM | POA: Insufficient documentation

## 2013-11-05 DIAGNOSIS — R002 Palpitations: Secondary | ICD-10-CM

## 2013-11-05 LAB — COMPREHENSIVE METABOLIC PANEL
ALT: 21 U/L (ref 0–55)
AST (SGOT): 22 U/L (ref 10–42)
Albumin/Globulin Ratio: 1.65 Ratio — ABNORMAL HIGH (ref 0.70–1.50)
Albumin: 4.5 gm/dL (ref 3.5–5.0)
Alkaline Phosphatase: 111 U/L (ref 40–145)
Anion Gap: 14.5 mMol/L (ref 7.0–18.0)
BUN / Creatinine Ratio: 35.2 Ratio — ABNORMAL HIGH (ref 10.0–30.0)
BUN: 37 mg/dL — ABNORMAL HIGH (ref 7–22)
Bilirubin, Total: 0.6 mg/dL (ref 0.1–1.2)
CO2: 24 mMol/L (ref 20.0–30.0)
Calcium: 11 mg/dL — ABNORMAL HIGH (ref 8.5–10.5)
Chloride: 104 mMol/L (ref 98–110)
Creatinine: 1.05 mg/dL (ref 0.60–1.20)
EGFR: 51 mL/min/{1.73_m2}
Globulin: 2.7 gm/dL (ref 2.0–4.0)
Glucose: 118 mg/dL — ABNORMAL HIGH (ref 70–99)
Osmolality Calc: 286 mOsm/kg (ref 275–300)
Potassium: 4.3 mMol/L (ref 3.5–5.3)
Protein, Total: 7.3 gm/dL (ref 6.0–8.3)
Sodium: 138 mMol/L (ref 136–147)

## 2013-11-05 LAB — CBC AND DIFFERENTIAL
Basophils %: 0.5 % (ref 0.0–3.0)
Basophils Absolute: 0 10*3/uL (ref 0.0–0.3)
Eosinophils %: 1.2 % (ref 0.0–7.0)
Eosinophils Absolute: 0.1 10*3/uL (ref 0.0–0.8)
Hematocrit: 43.3 % (ref 36.0–48.0)
Hemoglobin: 15.1 gm/dL (ref 12.0–16.0)
Lymphocytes Absolute: 1.7 10*3/uL (ref 0.6–5.1)
Lymphocytes: 22 % (ref 15.0–46.0)
MCH: 32 pg (ref 28–35)
MCHC: 35 gm/dL (ref 31–36)
MCV: 91 fL (ref 80–100)
MPV: 9.1 fL (ref 6.0–10.0)
Monocytes Absolute: 0.7 10*3/uL (ref 0.1–1.7)
Monocytes: 9.8 % (ref 3.0–15.0)
Neutrophils %: 66.4 % (ref 42.0–78.0)
Neutrophils Absolute: 5 10*3/uL (ref 1.7–8.6)
PLT CT: 145 10*3/uL (ref 130–440)
RBC: 4.76 10*6/uL (ref 3.80–5.00)
RDW: 11.8 % (ref 10.5–14.5)
WBC: 7.5 10*3/uL (ref 4.00–11.00)

## 2013-11-05 LAB — VH CARDIAC PROF.WITH TROPONIN
Creatine Kinase (CK): 80 U/L (ref 30–189)
Creatinine Kinase MB (CKMB): 1.6 ng/mL (ref 0.1–6.0)
Troponin I: 0.02 ng/mL (ref 0.00–0.02)

## 2013-11-05 LAB — B-TYPE NATRIURETIC PEPTIDE: B-Natriuretic Peptide: 613.8 pg/mL — ABNORMAL HIGH (ref 0.0–100.0)

## 2013-11-05 LAB — D-DIMER, QUANTITATIVE: D-Dimer: 2.01 mg/L FEU — ABNORMAL HIGH (ref 0.19–0.52)

## 2013-11-05 MED ORDER — METOPROLOL TARTRATE 5 MG/5ML IV SOLN
3.0000 mg | Freq: Once | INTRAVENOUS | Status: AC
Start: 2013-11-05 — End: 2013-11-05
  Administered 2013-11-05: 3 mg via INTRAVENOUS

## 2013-11-05 MED ORDER — IOHEXOL 350 MG/ML IV SOLN
80.0000 mL | Freq: Once | INTRAVENOUS | Status: AC | PRN
Start: 2013-11-05 — End: 2013-11-05
  Administered 2013-11-05: 80 mL via INTRAVENOUS

## 2013-11-05 MED ORDER — HYDROCODONE-ACETAMINOPHEN 5-325 MG PO TABS
1.0000 | ORAL_TABLET | Freq: Four times a day (QID) | ORAL | Status: DC | PRN
Start: 2013-11-05 — End: 2013-11-07
  Administered 2013-11-05 – 2013-11-07 (×2): 1 via ORAL

## 2013-11-05 MED ORDER — SPIRONOLACTONE 25 MG PO TABS
50.0000 mg | ORAL_TABLET | Freq: Every day | ORAL | Status: DC
Start: 2013-11-05 — End: 2013-11-07
  Administered 2013-11-06 – 2013-11-07 (×2): 50 mg via ORAL

## 2013-11-05 MED ORDER — ATENOLOL 50 MG PO TABS
100.0000 mg | ORAL_TABLET | Freq: Every day | ORAL | Status: DC
Start: 2013-11-05 — End: 2013-11-07
  Administered 2013-11-06 – 2013-11-07 (×2): 100 mg via ORAL

## 2013-11-05 MED ORDER — WARFARIN SODIUM 2.5 MG PO TABS
2.5000 mg | ORAL_TABLET | Freq: Every day | ORAL | Status: DC
Start: 2013-11-05 — End: 2013-11-07
  Administered 2013-11-05 – 2013-11-07 (×3): 2.5 mg via ORAL

## 2013-11-05 MED ORDER — MORPHINE SULFATE 2 MG/ML IJ/IV SOLN (WRAP)
1.0000 mg | Status: DC | PRN
Start: 2013-11-05 — End: 2013-11-07

## 2013-11-05 MED ORDER — PANTOPRAZOLE SODIUM 40 MG PO TBEC
40.0000 mg | DELAYED_RELEASE_TABLET | Freq: Every day | ORAL | Status: DC
Start: 2013-11-05 — End: 2013-11-07
  Administered 2013-11-06 – 2013-11-07 (×2): 40 mg via ORAL

## 2013-11-05 MED ORDER — FUROSEMIDE 40 MG PO TABS
40.0000 mg | ORAL_TABLET | Freq: Two times a day (BID) | ORAL | Status: DC
Start: 2013-11-05 — End: 2013-11-07
  Administered 2013-11-05 – 2013-11-06 (×3): 40 mg via ORAL

## 2013-11-05 MED ORDER — ENOXAPARIN SODIUM 80 MG/0.8ML SC SOLN
1.0000 mg/kg | Freq: Once | SUBCUTANEOUS | Status: AC
Start: 2013-11-05 — End: 2013-11-05
  Administered 2013-11-05: 80 mg via SUBCUTANEOUS

## 2013-11-05 MED ORDER — CLONAZEPAM 0.5 MG PO TABS
0.5000 mg | ORAL_TABLET | Freq: Two times a day (BID) | ORAL | Status: DC
Start: 2013-11-05 — End: 2013-11-07
  Administered 2013-11-05 – 2013-11-07 (×5): 0.5 mg via ORAL

## 2013-11-05 MED ORDER — FUROSEMIDE 10 MG/ML IJ SOLN
20.0000 mg | Freq: Once | INTRAMUSCULAR | Status: AC
Start: 2013-11-05 — End: 2013-11-05
  Administered 2013-11-05: 20 mg via INTRAVENOUS

## 2013-11-05 MED ORDER — DILTIAZEM HCL 120 MG PO TABS
120.0000 mg | ORAL_TABLET | Freq: Two times a day (BID) | ORAL | Status: DC
Start: 2013-11-05 — End: 2013-11-07
  Administered 2013-11-05 – 2013-11-07 (×5): 120 mg via ORAL

## 2013-11-05 NOTE — ED Notes (Signed)
c/o SOB and "racing heart". Wears 2L O2 at home.

## 2013-11-05 NOTE — Plan of Care (Signed)
Problem: Safety  Goal: Patient will be free from injury during hospitalization  Outcome: Progressing  Oriented to room, call light and equipment

## 2013-11-05 NOTE — ED Notes (Signed)
Melba Coon, RN notified of patient's admission.

## 2013-11-05 NOTE — ED Notes (Signed)
Supervisor aware of pending admission.

## 2013-11-05 NOTE — ED Notes (Signed)
Registration made aware of pending admission to Med-Surg Rm. 204.

## 2013-11-05 NOTE — ED Notes (Signed)
Assited patient to [facility].

## 2013-11-05 NOTE — Progress Notes (Signed)
Patient transported to MedSurg via stretcher and 2L O2. Patient stable during transport. Report given to Unknown Foley, RN. Patient belongings transported with patient.

## 2013-11-05 NOTE — ED Provider Notes (Addendum)
[facility]   EMERGENCY DEPARTMENT HISTORY AND PHYSICAL EXAM    Date: 11/05/2013  Patient Name: [facility] [name]  Attending Physician: b. Marchelle Folks, MD  Patient DOB:  03/16/1941  MRN:  [id number]  Room:  ED6/ED6-A    Patient was evaluated by ED physician,B. Marchelle Folks, MD at 7:55 PM    History     Chief Complaint   Patient presents with   . Shortness of Breath       HPI:  The patient [name], is a 73 y.o. female who presents with chief complaint of couple days of heart racing  ? Slightly more SOB  No CP  No fever  No other complaints  Hx of a-fib      Location  Severity  Duration  Radiation  Character  Onset  Modifying  Associated Sx     PCP:  Rakaric, Valinda Hoar, MD (General)      Past Medical History       Past Medical History   Diagnosis Date   . Atrial fibrillation    . Malignant neoplasm    . Pacemaker    . Hypertension          Past Surgical History       Past Surgical History   Procedure Laterality Date   . Hysterectomy     . Cardiac pacemaker placement       2002         Family History    Family History   Problem Relation Age of Onset   . Breast cancer Sister        Social History    History     Social History   . Marital Status: Widowed     Spouse Name: N/A     Number of Children: N/A   . Years of Education: N/A     Social History Main Topics   . Smoking status: Never Smoker    . Smokeless tobacco: None   . Alcohol Use: No   . Drug Use: No   . Sexual Activity: None     Other Topics Concern   . None     Social History Narrative       Allergies    Allergies   Allergen Reactions   . Codeine    . Morphine    . Nitroglycerin    . Pain Patch [Menthol]          Current/Home Medications    Current/Home Medications    ATENOLOL (TENORMIN) 100 MG TABLET    Take 100 mg by mouth daily.    BENZONATATE (TESSALON) 200 MG CAPSULE    Take 200 mg by mouth 3 (three) times daily as needed.    CALCIUM CARBONATE (OS-CAL) 600 MG TAB TABLET    Take 600 mg by mouth 2 (two) times daily with meals.     CLONAZEPAM (KLONOPIN) 0.5 MG TABLET    Take 0.5 mg by mouth 2 (two) times daily as needed.    DILTIAZEM (TIAZAC) 240 MG 24 HR CAPSULE    Take 240 mg by mouth daily.    ESTRADIOL (ESTRACE) 0.1 MG/GM VAGINAL CREAM    Place 2 g vaginally daily.    FUROSEMIDE (LASIX) 40 MG TABLET    Take 40 mg by mouth 2 (two) times daily.    HYDROCODONE-ACETAMINOPHEN (NORCO) 5-325 MG PER TABLET    Take 1 tablet by mouth every 6 (six) hours as needed.    KETOCONAZOLE (NIZORAL) 2 %  CREAM    Apply topically daily.    OMEPRAZOLE (PRILOSEC) 20 MG CAPSULE    Take 20 mg by mouth daily.    POTASSIUM CHLORIDE (KLOR-CON) 20 MEQ PACKET    Take 20 mEq by mouth 2 (two) times daily.    RANITIDINE (ZANTAC) 150 MG TABLET    Take 150 mg by mouth 2 (two) times daily.    ROSUVASTATIN (CRESTOR) 10 MG TABLET    Take 10 mg by mouth daily.    SPIRONOLACTONE (ALDACTONE) 50 MG TABLET    Take 50 mg by mouth daily.    WARFARIN (COUMADIN) 2.5 MG TABLET    Take 2.5 mg by mouth daily.       Vital Signs     BP 149/127 mmHg  Pulse 70  Temp(Src) 97.7 F (36.5 C)  Resp 16  Ht 1.651 m  Wt 77.111 kg  BMI 28.29 kg/m2  SpO2 99%  Patient Vitals for the past 24 hrs:   BP Temp Pulse Resp SpO2 Height Weight   11/05/13 1915 149/127 mmHg 97.7 F (36.5 C) 70 16 99 % 1.651 m 77.111 kg         Review of Systems     Constitutional: No fever or weight loss.    Head: No headache    Eyes: No eye redness or pain    ENT: No ear pain  No sore throat    Cardiovascular: no c/o chest pain.  No DOE.  No orthopnea  Pos palpitations    Respiratory: No cough    Pos slight inc. in shortness of breath.    GI: No vomiting.  No diarrhea. No abd pain    Genitourinary:no frequency, urgency, pain, or burning    Musculoskeletal:no pain, no cyanosis, no edema, no symptoms of DVT    Skin: no rashes or skin lesions.    Neurologic:no c/o weakness or paresthesia, no change in speech or vision    All other systems reviewed and negative      Physical Exam     CONSTITUTIONAL:  Patient is afebrile,  Vital signs reviewed, Well appearing,  Patient appears comfortable, Alert and oriented X 3    HEAD:   Atraumatic, Normocephalic.    EENT: Normal    NECK   Normal ROM,  Cervical spine nontender.  No meningeal signs    RESPIRATORY / CHEST:   Nontender,  Breath sounds normal, No respiratory distress.    CARDIOVASCULAR:   irreg, No murmur. No JVD    ABDOMEN: Soft   Nontender,  No peritoneal signs.   BS +  No masses    UPPER EXTREMITY:   Inspection normal, No cyanosis. Normal ROM    LOWER EXTREMITY:   Inspection normal, No cyanosis. No edema. No sign of DVT  Normal ROM    NEURO: Normal motor, sensory, DTR all ext,  CNs intact, Speech normal, Mentation normal    SPINE: No point tenderness  No redness  No STS    SKIN:  Warm,  Dry, Normal color, No rashes    LYMPHATIC:   No adenopathy noted      ED Medication Orders     ED Medication Orders    Start     Status Ordering Provider    11/05/13 2026  enoxaparin (LOVENOX) syringe 80 mg   Once     Route: Subcutaneous  Ordered Dose: 1 mg/kg     Last MAR action:  Given Beola Cord  Orders Placed During this Encounter     Orders Placed This Encounter   Procedures   . Blood pressure monitor   . XR Chest AP Portable   . CT Angiogram Chest   . B-type Natriuretic Peptide   . Cardiac Profile with Troponin   . CBC and differential   . Comprehensive metabolic panel   . D-dimer, quantitative   . Cardiac monitoring (Hard Wire)   . ECG 12 lead   . Saline lock IV   . PMH ED Bed Request       Diagnostic Study Results     Labs     Results    Procedure Component Value Units Date/Time    B-type Natriuretic Peptide [[id number]]  (Abnormal) Collected:  11/05/13 1925    Specimen Information:  Blood / Blood Updated:  11/05/13 2019     B-Natriuretic Peptide 613.8 (H) pg/mL     Cardiac Profile with Troponin [[id number]] Collected:  11/05/13 1925    Specimen Information:  Plasma Updated:  11/05/13 2008     Creatine Kinase (CK) 80 U/L      CKMB Index NI %      Creatinine Kinase MB (CKMB) 1.6  ng/mL      Troponin I 0.02 ng/mL     Comprehensive metabolic panel [[id number]]  (Abnormal) Collected:  11/05/13 1925    Specimen Information:  Blood / Plasma Updated:  11/05/13 2008     Sodium 138 mMol/L      Potassium 4.3 mMol/L      Chloride 104 mMol/L      CO2 24.0 mMol/L      CALCIUM 11.0 (H) mg/dL      Glucose 629 (H) mg/dL      Creatinine 5.28 mg/dL      BUN 37 (H) mg/dL      Protein, Total 7.3 gm/dL      Albumin 4.5 gm/dL      Alkaline Phosphatase 111 U/L      ALT 21 U/L      AST (SGOT) 22 U/L      Bilirubin, Total 0.6 mg/dL      Albumin/Globulin Ratio 1.65 (H) Ratio      Anion Gap 14.5 mMol/L      BUN/Creatinine Ratio 35.2 (H) Ratio      EGFR 51 mL/min/1.14m2      Osmolality Calc 286 mOsm/kg      Globulin 2.7 gm/dL     D-dimer, quantitative [[id number]]  (Abnormal) Collected:  11/05/13 1925    Specimen Information:  Blood / Blood Updated:  11/05/13 1955     D-Dimer 2.01 (H) mg/L FEU     CBC and differential [[id number]] Collected:  11/05/13 1925    Specimen Information:  Blood / Blood Updated:  11/05/13 1948     WBC 7.5 K/cmm      RBC 4.76 M/cmm      Hemoglobin 15.1 gm/dL      Hematocrit 03.4 %      MCV 91 fL      MCH 32 pg      MCHC 35 gm/dL      RDW 74.2 %      PLT CT 145 K/cmm      MPV 9.1 fL      NEUTROPHIL % 66.4 %      Lymphocytes 22.0 %      Monocytes 9.8 %      Eosinophils % 1.2 %      Basophils % 0.5 %  Neutrophils Absolute 5.0 K/cmm      Lymphocytes Absolute 1.7 K/cmm      Monocytes Absolute 0.7 K/cmm      Eosinophils Absolute 0.1 K/cmm      BASO Absolute 0.0 K/cmm           Radiologic Studies  Radiology Results (24 Hour)    Procedure Component Value Units Date/Time    CT Angiogram Chest [[id number]] Collected:  11/05/13 2107    Order Status:  Completed Updated:  11/05/13 2131    Narrative:      Clinical History:  sob/ inc d-dimer    Examination:  Serial axial images were obtained through the thorax with unenhanced timing bolus evaluation of the pulmonary artery,  this was then followed by rapid  infusion of IV contrast. Post enhanced images were sent to workstation for 3-D  reconstructions including sagittal and coronal MIP acquisitions used to confirm axial data findings.    Contrast:  Omnipaque 350, 80 mL, intravenous.    Comparison:  June 05, 2006.    Findings:  Unfortunately, there is poor pulmonary arterial opacification, and the study is nearly nondiagnostic to assess for  pulmonary emboli. There are no gross central pulmonary emboli. There appears to be poor mixing of the contrast, possibly  due to poor cardiac output. There is mild to moderate cardiomegaly, mildly worse when compared to the last exam. There  is again a bipolar pacemaker. There is aneurysmal dilation of the aorta, but increasing when compared to the last exam.  The ascending thoracic aorta measures 6.1 cm and previously measured 5.0 cm. The proximal descending thoracic aorta  measures 4.7 cm and previously measured 3.7 cm. There is no gross dissection, but there is nonopacification of the  aorta, limiting the sensitivity for detecting dissections. There is moderate arteriosclerosis, without displaced  calcifications.    There is no mediastinal or hilar mass or adenopathy.    There is chronic moderate elevation of the left hemidiaphragm. There are at least 3 low-density renal lesions which may  be cysts. There is also a tiny high density lesion in the posterior mid left kidney, possibly a hemorrhagic cyst. There  is reflux of contrast into the hepatic veins suggesting central congestion. There are no adrenal masses.    Lung windows reveal no endobronchial or endotracheal abnormality. There are many tiny groundglass nodules scattered in  the lungs, mildly worse when compared to the 2008 exam, but there was some disease on the previous study. This may be  the sequela of previous infection or an atypical infection. There is no new infiltrate, mass, or pleural accumulation.  There is volume loss associated with the elevated left  hemidiaphragm.    There is a healing mid sternal fracture. The bones are demineralized. There is scoliosis.      Impression:      Unfortunately, the pulmonary arterial opacification is poor, markedly decreasing the sensitivity for detecting pulmonary  emboli. There are no gross pulmonary emboli. There is significant enlargement of a thoracic aortic aneurysm, especially  increasing in size in the ascending thoracic aorta and the proximal descending thoracic aorta. There is no gross  dissection or acute hemorrhage. There is a chronic pacemaker. There is moderate LAD coronary artery calcification. There  is mild worsening of the cardiomegaly. There is a healing sternal fracture. Multiple other incidental findings are  detailed above.    ReadingStation:[id number]    XR Chest AP Portable [[id number]] Collected:  11/05/13 1956    Order Status:  Completed Updated:  11/05/13 2000    Narrative:      Clinical History:  chest pain    Examination:  Frontal view of the chest.    Comparison:  June 29, 2013    Findings:    AP upright portable chest radiograph 1951 hours.    There is likely moderate chronic cardiomegaly considering the technique. The aorta is moderately tortuous and  arteriosclerotic, with ectasia. There is no change in the left subclavian transvenous bipolar pacemaker.    There is chronic marked elevation of the left hemidiaphragm, with associated volume loss. There is no new infiltrate,  mass, or pleural accumulation.    The bony structures are grossly intact.      Impression:      There is chronic cardiomegaly and elevation of the left hemidiaphragm. There is no change in the pacemaker. There is no  definite acute pulmonary pathology on this limited portable exam.    ReadingStation:[email or web address]      .    Clinical Course / MDM     Notes:     Consults:       Data Review     Nursing records reviewed and agree: Yes    Pulse Oximetry Analysis - Normal  Laboratory results reviewed by EDP: Yes  Radiologic study results  reviewed by EDP: Yes    Rendering Provider: Beola Cord, MD      Monitors, EKG     Cardiac Monitor (interpreted by ED physician):  A-fib at 102    EKG (interpreted by ED physician):   RATE:104  RHYTHM:a-fib  QRS:101  PR:na  NO ISCHEMIC CHANGES  Deep inverted t-waves  Similar to most recent        Critical Care     Critical care exclusive of time spent performing procedures.    Total time:          Clinical Impression & Disposition     Clinical Impression:  1. Acute on chronic congestive heart failure, unspecified congestive heart failure type    2. Heart palpitations    3. Atrial fibrillation with RVR        Disposition  ED Disposition    Admit Bed Type: Telemetry [5]  Admitting Physician: Lemar Lofty [[id number]]  Patient Class: Observation [104]            Prescriptions    New Prescriptions    No medications on file         Signed,  B. Marchelle Folks, MD  9:42 PM 11/05/2013                Beola Cord, MD  11/05/13 3244    Beola Cord, MD  11/05/13 2142

## 2013-11-06 ENCOUNTER — Observation Stay: Payer: Medicare Other

## 2013-11-06 DIAGNOSIS — R0602 Shortness of breath: Secondary | ICD-10-CM

## 2013-11-06 DIAGNOSIS — I4891 Unspecified atrial fibrillation: Secondary | ICD-10-CM

## 2013-11-06 DIAGNOSIS — I4821 Permanent atrial fibrillation: Secondary | ICD-10-CM | POA: Diagnosis not present

## 2013-11-06 LAB — PT/INR
PT INR: 1.5 — ABNORMAL HIGH (ref 0.8–1.2)
PT: 16.1 s — ABNORMAL HIGH (ref 9.4–12.5)

## 2013-11-06 LAB — CBC AND DIFFERENTIAL
Bands: 1 % (ref 0–10)
Basophils %: 0 % (ref 0.0–3.0)
Basophils Absolute: 0 10*3/uL (ref 0.0–0.3)
Eosinophils %: 0 % (ref 0.0–7.0)
Eosinophils Absolute: 0 10*3/uL (ref 0.0–0.8)
Hematocrit: 42.8 % (ref 36.0–48.0)
Hemoglobin: 15 gm/dL (ref 12.0–16.0)
Lymphocytes Absolute: 1.7 10*3/uL (ref 0.6–5.1)
Lymphocytes: 26 % (ref 15.0–46.0)
MCH: 32 pg (ref 28–35)
MCHC: 35 gm/dL (ref 31–36)
MCV: 90 fL (ref 80–100)
MPV: 8.8 fL (ref 6.0–10.0)
Monocytes Absolute: 0.8 10*3/uL (ref 0.1–1.7)
Monocytes: 12 % (ref 3.0–15.0)
Neutrophils %: 61 % (ref 42.0–78.0)
Neutrophils Absolute: 4.2 10*3/uL (ref 1.7–8.6)
PLT CT: 131 10*3/uL (ref 130–440)
RBC: 4.75 10*6/uL (ref 3.80–5.00)
RDW: 11.8 % (ref 10.5–14.5)
WBC: 6.7 10*3/uL (ref 4.00–11.00)

## 2013-11-06 LAB — COMPREHENSIVE METABOLIC PANEL
ALT: 18 U/L (ref 0–55)
AST (SGOT): 20 U/L (ref 10–42)
Albumin/Globulin Ratio: 1.66 Ratio — ABNORMAL HIGH (ref 0.70–1.50)
Albumin: 4.3 gm/dL (ref 3.5–5.0)
Alkaline Phosphatase: 100 U/L (ref 40–145)
Anion Gap: 13 mMol/L (ref 7.0–18.0)
BUN / Creatinine Ratio: 34 Ratio — ABNORMAL HIGH (ref 10.0–30.0)
BUN: 33 mg/dL — ABNORMAL HIGH (ref 7–22)
Bilirubin, Total: 0.7 mg/dL (ref 0.1–1.2)
CO2: 28 mMol/L (ref 20.0–30.0)
Calcium: 10.8 mg/dL — ABNORMAL HIGH (ref 8.5–10.5)
Chloride: 102 mMol/L (ref 98–110)
Creatinine: 0.97 mg/dL (ref 0.60–1.20)
EGFR: 56 mL/min/{1.73_m2}
Globulin: 2.6 gm/dL (ref 2.0–4.0)
Glucose: 78 mg/dL (ref 70–99)
Osmolality Calc: 285 mOsm/kg (ref 275–300)
Potassium: 3.6 mMol/L (ref 3.5–5.3)
Protein, Total: 6.9 gm/dL (ref 6.0–8.3)
Sodium: 140 mMol/L (ref 136–147)

## 2013-11-06 LAB — B-TYPE NATRIURETIC PEPTIDE: B-Natriuretic Peptide: 654.9 pg/mL — ABNORMAL HIGH (ref 0.0–100.0)

## 2013-11-06 MED ORDER — FUROSEMIDE 10 MG/ML IJ SOLN
40.0000 mg | Freq: Two times a day (BID) | INTRAMUSCULAR | Status: DC
Start: 2013-11-06 — End: 2013-11-07
  Administered 2013-11-06 – 2013-11-07 (×3): 40 mg via INTRAVENOUS

## 2013-11-06 NOTE — Plan of Care (Signed)
Problem: Pain  Goal: Patient's pain/discomfort is manageable  Outcome: Progressing

## 2013-11-06 NOTE — UM Notes (Signed)
[facility] Utilization Management Review Sheet    NAME: [name]  MR#: [id number]    CSN#: [id number]    ROOM: 204/204-A AGE: 73 y.o.    START OF SERVICE DATE AND TIME: 11/05/2013  7:15 PM  ADMIT DATE/TIME: 11/05/2013 2151    PATIENT CLASS: OBSERVATION    ATTENDING PHYSICIAN: Lemar Lofty, MD  PAYOR:Payor: MEDICARE / Plan: MEDICARE PART A AND B / Product Type: *No Product type* /       AUTH #: [id number]-[id number]    DIAGNOSIS:     ICD-9-CM    1. Acute on chronic congestive heart failure, unspecified congestive heart failure type 428.0    2. Heart palpitations 785.1    3. Atrial fibrillation with RVR 427.31        HISTORY:   Past Medical History   Diagnosis Date   . Atrial fibrillation    . Malignant neoplasm    . Pacemaker    . Hypertension    . Aortic aneurysm        DATE OF REVIEW: 11/06/2013    VITALS: BP 139/60 mmHg  Pulse 73  Temp(Src) 98.1 F (36.7 C) (Oral)  Resp 17  Ht 1.626 m (5\' 4" )  Wt 76.068 kg (167 lb 11.2 oz)  BMI 28.77 kg/m2  SpO2 99%    To the emergency department with chief complaint of heart racing and shortness of breath.    ED Vital Signs:  BP 149/127 mmHg  Pulse 70  Temp(Src) 97.7 F (36.5 C)  Resp 16  Ht 1.651 m  Wt 77.111 kg  BMI 28.29 kg/m2  SpO2 99%    ED Treatment:  Lovenox 80 mg     ED Diagnostics:  BNP 613, CK 80, Trop 0.02, Na 138, K+ 4.3, Bun 37, Cr 1.05, D Dimer 2.01, WBC 7.5, CT Chest:  Unfortunately, the pulmonary arterial opacification is poor, markedly decreasing the sensitivity for detecting pulmonary emboli. There are no gross pulmonary emboli. There is significant enlargement of a thoracic aortic aneurysm, especiallyincreasing in size in the ascending thoracic aorta and the proximal descending thoracic aorta. There is no gross dissection or acute hemorrhage. There is a chronic pacemaker. There is moderate LAD coronary artery calcification. Thereis mild worsening of the cardiomegaly. There is a healing sternal fracture. Multiple other incidental findings are  detailed above.  Chest X-ray:  There is chronic cardiomegaly and elevation of the left hemidiaphragm. There is no change in the pacemaker. There is no definite acute pulmonary pathology on this limited portable exam, EKG:  A-fib    Orders:  Observation, weigh daily, up with assistance, telemetry monitoring, INT, Oxygen at 2 liters continuous- titrate to maintain SPO2 90-96%, full code, INR daily,  Lasix 20 mg Iv x 1 dose, Lovenox 80 mg daily, Morphine 1 mg every 4 hours as needed, Iv Lasix 40 mg every 12 hours     Mcg 19 th Edition:  OC-024 for complaints of shortness of breath and OC-022 for telemetry/cardiac monitoring and Iv Lasix every 12 hours/Iv medications

## 2013-11-06 NOTE — Progress Notes (Signed)
73 year old female placed on Observation services on 11/05/2013 with diagnosis of Chest pain.  Met with patient this afternoon to discuss discharge planning.  Patient states that prior to this hospitalization she lived at home and that her daughter lives with her.  Patient states that she has a very supportive daughter that is able to provide any assistance that may be needed at time of discharge.  Patient states that prior to this hospitalization she remained fairly independent.  Patient states that she is able to perform her own personal care, does the laundry, cooking, household chores, and continue to drive  Patient states that she uses a cane to assist with ambulation needs. Patient also mentions that she has a quad cane and walker at home if needed.  Patient mentions that at time of discharge her daughter will provide transportation home via private vehicle.  At this time patient voices no financial concerns, however, case management will continue to monitor for interventions that may arise until discharge time.

## 2013-11-06 NOTE — H&P (Signed)
ADMISSION HISTORY AND PHYSICAL EXAM    Date Time: 11/06/2013 2:03 PM  Patient Name: [name]  Attending Physician: Lemar Lofty, MD    Assessment/ Plan:   1. SOB: check ECHO, repeat CXR, start IV lasix since elevated BNP likely indicates CHF exacerbation.   2. Afib: rate controlled and anticoagulated      History of Present Illness:   [name] is a 73 y.o. female who presents to the hospital with SOB and cough. She states this began a couple of days ago. She feels very congested in her chest and feels most bothered by the fact that she feels like she needs to cough something up but can't. She has been having pains in her left upper chest but none now. She denies f/c. No n/v. No abdominal pain. No problems urinating. She has been eating and drinking ok.     Past Medical History:     Past Medical History   Diagnosis Date   . Atrial fibrillation    . Malignant neoplasm    . Pacemaker    . Hypertension    . Aortic aneurysm        Past Surgical History:     Past Surgical History   Procedure Laterality Date   . Hysterectomy     . Cardiac pacemaker placement       2002       Family History:     Family History   Problem Relation Age of Onset   . Breast cancer Sister        Social History:     History     Social History   . Marital Status: Widowed     Spouse Name: N/A     Number of Children: N/A   . Years of Education: N/A     Social History Main Topics   . Smoking status: Never Smoker    . Smokeless tobacco: Not on file   . Alcohol Use: No   . Drug Use: No   . Sexual Activity: Not on file     Other Topics Concern   . Not on file     Social History Narrative       Allergies:     Allergies   Allergen Reactions   . Codeine    . Morphine    . Nitroglycerin    . Pain Patch [Menthol]        Medications:     Prescriptions prior to admission   Medication Sig   . atenolol (TENORMIN) 100 MG tablet Take 100 mg by mouth daily.   . calcium carbonate (OS-CAL) 600 MG Tab tablet Take 600 mg by mouth 2  (two) times daily with meals.   . clonazePAM (KLONOPIN) 0.5 MG tablet Take 0.5 mg by mouth 2 (two) times daily as needed.   . furosemide (LASIX) 40 MG tablet Take 40 mg by mouth 2 (two) times daily.   Marland Kitchen HYDROcodone-acetaminophen (NORCO) 5-325 MG per tablet Take 1 tablet by mouth every 6 (six) hours as needed.   . potassium chloride (KLOR-CON) 20 MEQ packet Take 20 mEq by mouth 2 (two) times daily.   . rosuvastatin (CRESTOR) 10 MG tablet Take 10 mg by mouth daily.   Marland Kitchen spironolactone (ALDACTONE) 50 MG tablet Take 50 mg by mouth daily.   Marland Kitchen warfarin (COUMADIN) 2.5 MG tablet Take 2.5 mg by mouth daily.   . benzonatate (TESSALON) 200 MG capsule Take 200 mg by mouth 3 (three) times  daily as needed.   . diltiazem (TIAZAC) 240 MG 24 hr capsule Take 240 mg by mouth daily.   Marland Kitchen estradiol (ESTRACE) 0.1 MG/GM vaginal cream Place 2 g vaginally daily.   Marland Kitchen ketoconazole (NIZORAL) 2 % cream Apply topically daily.   Marland Kitchen omeprazole (PRILOSEC) 20 MG capsule Take 20 mg by mouth daily.   . ranitidine (ZANTAC) 150 MG tablet Take 150 mg by mouth 2 (two) times daily.       Review of Systems:   10/10 ROS otherwise - except as stated in HPI.     Physical Exam:     Filed Vitals:    11/06/13 0633   BP: 139/60   Pulse: 73   Temp: 98.1 F (36.7 C)   Resp: 17   SpO2: 99%       Intake and Output Summary (Last 24 hours) at Date Time    Intake/Output Summary (Last 24 hours) at 11/06/13 1403  Last data filed at 11/06/13 1300   Gross per 24 hour   Intake    490 ml   Output      0 ml   Net    490 ml     Gen: NAD, AOx3  HEENT: moist oral mucosa, NCAT  Neck: supple, no cervical adenopathy  Heart: RRR, no MGR  Lungs: CTAB  Abd: soft, nt  Ext: no pedal edema, intact TA and DP pulses bilaterally  Skin: warm and dry  Neuro: 5/5 muscle strength throughout, no facial droop or focal deficits      Labs:     Results    Procedure Component Value Units Date/Time    B-type Natriuretic Peptide [[id number]]  (Abnormal) Collected:  11/06/13 0552    Specimen Information:   Blood / Blood Updated:  11/06/13 0719     B-Natriuretic Peptide 654.9 (H) pg/mL     CBC and differential [[id number]] Collected:  11/06/13 0552    Specimen Information:  Blood / Blood Updated:  11/06/13 0717     WBC 6.7 K/cmm      RBC 4.75 M/cmm      Hemoglobin 15.0 gm/dL      Hematocrit 84.1 %      MCV 90 fL      MCH 32 pg      MCHC 35 gm/dL      RDW 32.4 %      PLT CT 131 K/cmm      MPV 8.8 fL      NEUTROPHIL % 61.0 %      Lymphocytes 26.0 %      Monocytes 12.0 %      Eosinophils % 0.0 %      Basophils % 0.0 %      Bands 1 %      Neutrophils Absolute 4.2 K/cmm      Lymphocytes Absolute 1.7 K/cmm      Monocytes Absolute 0.8 K/cmm      Eosinophils Absolute 0.0 K/cmm      BASO Absolute 0.0 K/cmm      RBC Morphology RBC Morphology Reviewed     Narrative:      Manual differential performed    Comprehensive metabolic panel [[id number]]  (Abnormal) Collected:  11/06/13 0552    Specimen Information:  Blood / Plasma Updated:  11/06/13 6644     Sodium 140 mMol/L      Potassium 3.6 mMol/L      Chloride 102 mMol/L      CO2 28.0 mMol/L  CALCIUM 10.8 (H) mg/dL      Glucose 78 mg/dL      Creatinine 1.61 mg/dL      BUN 33 (H) mg/dL      Protein, Total 6.9 gm/dL      Albumin 4.3 gm/dL      Alkaline Phosphatase 100 U/L      ALT 18 U/L      AST (SGOT) 20 U/L      Bilirubin, Total 0.7 mg/dL      Albumin/Globulin Ratio 1.66 (H) Ratio      Anion Gap 13.0 mMol/L      BUN/Creatinine Ratio 34.0 (H) Ratio      EGFR 56 mL/min/1.10m2      Osmolality Calc 285 mOsm/kg      Globulin 2.6 gm/dL     Prothrombin Time / INR [[id number]]  (Abnormal) Collected:  11/06/13 0552    Specimen Information:  Blood / Blood Updated:  11/06/13 0625     PT 16.1 (H) sec      PT INR 1.5 (H)     B-type Natriuretic Peptide [[id number]]  (Abnormal) Collected:  11/05/13 1925    Specimen Information:  Blood / Blood Updated:  11/05/13 2019     B-Natriuretic Peptide 613.8 (H) pg/mL     Cardiac Profile with Troponin [[id number]] Collected:  11/05/13 1925    Specimen  Information:  Plasma Updated:  11/05/13 2008     Creatine Kinase (CK) 80 U/L      CKMB Index NI %      Creatinine Kinase MB (CKMB) 1.6 ng/mL      Troponin I 0.02 ng/mL     Comprehensive metabolic panel [[id number]]  (Abnormal) Collected:  11/05/13 1925    Specimen Information:  Blood / Plasma Updated:  11/05/13 2008     Sodium 138 mMol/L      Potassium 4.3 mMol/L      Chloride 104 mMol/L      CO2 24.0 mMol/L      CALCIUM 11.0 (H) mg/dL      Glucose 528 (H) mg/dL      Creatinine 4.13 mg/dL      BUN 37 (H) mg/dL      Protein, Total 7.3 gm/dL      Albumin 4.5 gm/dL      Alkaline Phosphatase 111 U/L      ALT 21 U/L      AST (SGOT) 22 U/L      Bilirubin, Total 0.6 mg/dL      Albumin/Globulin Ratio 1.65 (H) Ratio      Anion Gap 14.5 mMol/L      BUN/Creatinine Ratio 35.2 (H) Ratio      EGFR 51 mL/min/1.89m2      Osmolality Calc 286 mOsm/kg      Globulin 2.7 gm/dL     D-dimer, quantitative [[id number]]  (Abnormal) Collected:  11/05/13 1925    Specimen Information:  Blood / Blood Updated:  11/05/13 1955     D-Dimer 2.01 (H) mg/L FEU     CBC and differential [[id number]] Collected:  11/05/13 1925    Specimen Information:  Blood / Blood Updated:  11/05/13 1948     WBC 7.5 K/cmm      RBC 4.76 M/cmm      Hemoglobin 15.1 gm/dL      Hematocrit 74.2 %      MCV 91 fL      MCH 32 pg      MCHC 35 gm/dL      RDW 59.5 %  PLT CT 145 K/cmm      MPV 9.1 fL      NEUTROPHIL % 66.4 %      Lymphocytes 22.0 %      Monocytes 9.8 %      Eosinophils % 1.2 %      Basophils % 0.5 %      Neutrophils Absolute 5.0 K/cmm      Lymphocytes Absolute 1.7 K/cmm      Monocytes Absolute 0.7 K/cmm      Eosinophils Absolute 0.1 K/cmm      BASO Absolute 0.0 K/cmm             Rads:   Radiological Procedure reviewed.    Ct Angiogram Chest    11/05/2013   Unfortunately, the pulmonary arterial opacification is poor, markedly decreasing the sensitivity for detecting pulmonary emboli. There are no gross pulmonary emboli. There is significant enlargement of a thoracic  aortic aneurysm, especially increasing in size in the ascending thoracic aorta and the proximal descending thoracic aorta. There is no gross dissection or acute hemorrhage. There is a chronic pacemaker. There is moderate LAD coronary artery calcification. There is mild worsening of the cardiomegaly. There is a healing sternal fracture. Multiple other incidental findings are detailed above.  ReadingStation:[email or web address]    Xr Chest Ap Portable    11/06/2013   No appreciable interval change in the appearance of cardiomegaly, aortic aneurysm and elevation of the left hemidiaphragm.  ReadingStation:[email or web address]    Xr Chest Ap Portable    11/05/2013   There is chronic cardiomegaly and elevation of the left hemidiaphragm. There is no change in the pacemaker. There is no definite acute pulmonary pathology on this limited portable exam.  ReadingStation:[email or web address]        Signed by: Charna Elizabeth, DO          I hereby certify this patient for hospitalization based upon medical necessity as noted above.

## 2013-11-07 ENCOUNTER — Observation Stay: Payer: Medicare Other

## 2013-11-07 DIAGNOSIS — I509 Heart failure, unspecified: Secondary | ICD-10-CM

## 2013-11-07 LAB — THYROID STIMULATING HORMONE (TSH), REFLEX ON ABNORMAL TO FREE T4, SERUM: TSH (Reflex Free T4 if Indicated): 0.12 u[IU]/mL — ABNORMAL LOW (ref 0.40–4.20)

## 2013-11-07 LAB — BASIC METABOLIC PANEL
Anion Gap: 15.5 mMol/L (ref 7.0–18.0)
BUN / Creatinine Ratio: 30.1 Ratio — ABNORMAL HIGH (ref 10.0–30.0)
BUN: 43 mg/dL — ABNORMAL HIGH (ref 7–22)
CO2: 27 mMol/L (ref 20.0–30.0)
Calcium: 10.7 mg/dL — ABNORMAL HIGH (ref 8.5–10.5)
Chloride: 100 mMol/L (ref 98–110)
Creatinine: 1.43 mg/dL — ABNORMAL HIGH (ref 0.60–1.20)
EGFR: 36 mL/min/{1.73_m2}
Glucose: 79 mg/dL (ref 70–99)
Osmolality Calc: 288 mOsm/kg (ref 275–300)
Potassium: 3.7 mMol/L (ref 3.5–5.3)
Sodium: 139 mMol/L (ref 136–147)

## 2013-11-07 LAB — CBC AND DIFFERENTIAL
Basophils %: 0.9 % (ref 0.0–3.0)
Basophils Absolute: 0.1 10*3/uL (ref 0.0–0.3)
Eosinophils %: 1.9 % (ref 0.0–7.0)
Eosinophils Absolute: 0.1 10*3/uL (ref 0.0–0.8)
Hematocrit: 44.3 % (ref 36.0–48.0)
Hemoglobin: 15 gm/dL (ref 12.0–16.0)
Lymphocytes Absolute: 1.7 10*3/uL (ref 0.6–5.1)
Lymphocytes: 23.2 % (ref 15.0–46.0)
MCH: 31 pg (ref 28–35)
MCHC: 34 gm/dL (ref 31–36)
MCV: 92 fL (ref 80–100)
MPV: 7.7 fL (ref 6.0–10.0)
Monocytes Absolute: 1 10*3/uL (ref 0.1–1.7)
Monocytes: 13.9 % (ref 3.0–15.0)
Neutrophils %: 60.1 % (ref 42.0–78.0)
Neutrophils Absolute: 4.3 10*3/uL (ref 1.7–8.6)
PLT CT: 142 10*3/uL (ref 130–440)
RBC: 4.81 10*6/uL (ref 3.80–5.00)
RDW: 12 % (ref 10.5–14.5)
WBC: 7.2 10*3/uL (ref 4.00–11.00)

## 2013-11-07 LAB — B-TYPE NATRIURETIC PEPTIDE: B-Natriuretic Peptide: 274.4 pg/mL — ABNORMAL HIGH (ref 0.0–100.0)

## 2013-11-07 LAB — PT/INR
PT INR: 1.5 — ABNORMAL HIGH (ref 0.8–1.2)
PT: 15.8 s — ABNORMAL HIGH (ref 9.4–12.5)

## 2013-11-07 MED ORDER — FUROSEMIDE 40 MG PO TABS
60.0000 mg | ORAL_TABLET | Freq: Every day | ORAL | Status: DC
Start: 2013-11-07 — End: 2015-07-19

## 2013-11-07 MED ORDER — WARFARIN SODIUM 3 MG PO TABS
3.0000 mg | ORAL_TABLET | Freq: Every day | ORAL | Status: DC
Start: 2013-11-07 — End: 2019-07-10

## 2013-11-07 NOTE — Discharge Instructions (Signed)
Please follow up with your PCP in 1-2 weeks or sooner if needed.     Take lasix 60 mg daily.     Take coumadin 3 mg daily pending results of INR next week and follow up with your PCP.     Return to the ED for any medical emergency.     Diet: heart healthy  Activity: as tolerated

## 2013-11-07 NOTE — Plan of Care (Signed)
Problem: Pain  Goal: Patient's pain/discomfort is manageable  Outcome: Progressing

## 2013-11-07 NOTE — Plan of Care (Signed)
Goals met, discharge home.

## 2013-11-07 NOTE — Discharge Summary (Signed)
DISCHARGE SUMMARY      Date Time: 11/07/2013 6:12 PM  Patient Name: [name]  Attending Physician: Lemar Lofty, MD  Primary Care Physician: Ladona Horns, MD (General)    Date of Admission:   11/05/2013    Date of Discharge:   11/07/2013      Discharge Dx:     Patient Active Problem List   Diagnosis   . CHF (congestive heart failure)   . Acute on chronic congestive heart failure, unspecified congestive heart failure type   . Atrial fibrillation with RVR          Procedures/Radiology performed:       Results    Procedure Component Value Units Date/Time    B-type Natriuretic Peptide [[id number]]  (Abnormal) Collected:  11/07/13 0550    Specimen Information:  Blood / Blood Updated:  11/07/13 0711     B-Natriuretic Peptide 274.4 (H) pg/mL     Basic Metabolic Panel [[id number]]  (Abnormal) Collected:  11/07/13 0550    Specimen Information:  Blood / Plasma Updated:  11/07/13 0651     Sodium 139 mMol/L      Potassium 3.7 mMol/L      Chloride 100 mMol/L      CO2 27.0 mMol/L      CALCIUM 10.7 (H) mg/dL      Glucose 79 mg/dL      Creatinine 0.16 (H) mg/dL      BUN 43 (H) mg/dL      Anion Gap 01.0 mMol/L      BUN/Creatinine Ratio 30.1 (H) Ratio      EGFR 36 mL/min/1.43m2      Osmolality Calc 288 mOsm/kg     Prothrombin Time / INR [[id number]]  (Abnormal) Collected:  11/07/13 0550    Specimen Information:  Blood / Blood Updated:  11/07/13 0640     PT 15.8 (H) sec      PT INR 1.5 (H)     CBC and differential [[id number]] Collected:  11/07/13 0550    Specimen Information:  Blood / Blood Updated:  11/07/13 0637     WBC 7.2 K/cmm      RBC 4.81 M/cmm      Hemoglobin 15.0 gm/dL      Hematocrit 23.7 %      MCV 92 fL      MCH 31 pg      MCHC 34 gm/dL      RDW 62.8 %      PLT CT 142 K/cmm      MPV 7.7 fL      NEUTROPHIL % 60.1 %      Lymphocytes 23.2 %      Monocytes 13.9 %      Eosinophils % 1.9 %      Basophils % 0.9 %      Neutrophils Absolute 4.3 K/cmm      Lymphocytes Absolute 1.7 K/cmm      Monocytes  Absolute 1.0 K/cmm      Eosinophils Absolute 0.1 K/cmm      BASO Absolute 0.1 K/cmm         Xr Chest 2 Views    11/07/2013   Clinical History: SOB  Examination: Frontal and lateral views of the chest.  Comparison: November 06, 2013  Findings: Cardiomegaly unchanged. No overt failure. Elevation left hemidiaphragm unchanged. Tortuous ectatic thoracic aorta and dual lead cardiac pacemaker unchanged. No effusion.     11/07/2013   No acute cardiopulmonary process, stable chronic findings as above  ReadingStation:[email or web address]    Ct Angiogram Chest    11/05/2013   Clinical History: sob/ inc d-dimer  Examination: Serial axial images were obtained through the thorax with unenhanced timing bolus evaluation of the pulmonary artery, this was then followed by rapid infusion of IV contrast. Post enhanced images were sent to workstation for 3-D reconstructions including sagittal and coronal MIP acquisitions used to confirm axial data findings.  Contrast: Omnipaque 350, 80 mL, intravenous.  Comparison: June 05, 2006.  Findings: Unfortunately, there is poor pulmonary arterial opacification, and the study is nearly nondiagnostic to assess for pulmonary emboli. There are no gross central pulmonary emboli. There appears to be poor mixing of the contrast, possibly due to poor cardiac output. There is mild to moderate cardiomegaly, mildly worse when compared to the last exam. There is again a bipolar pacemaker. There is aneurysmal dilation of the aorta, but increasing when compared to the last exam. The ascending thoracic aorta measures 6.1 cm and previously measured 5.0 cm. The proximal descending thoracic aorta measures 4.7 cm and previously measured 3.7 cm. There is no gross dissection, but there is nonopacification of the aorta, limiting the sensitivity for detecting dissections. There is moderate arteriosclerosis, without displaced calcifications.  There is no mediastinal or hilar mass or adenopathy.  There is chronic moderate elevation of  the left hemidiaphragm. There are at least 3 low-density renal lesions which may be cysts. There is also a tiny high density lesion in the posterior mid left kidney, possibly a hemorrhagic cyst. There is reflux of contrast into the hepatic veins suggesting central congestion. There are no adrenal masses.  Lung windows reveal no endobronchial or endotracheal abnormality. There are many tiny groundglass nodules scattered in the lungs, mildly worse when compared to the 2008 exam, but there was some disease on the previous study. This may be the sequela of previous infection or an atypical infection. There is no new infiltrate, mass, or pleural accumulation. There is volume loss associated with the elevated left hemidiaphragm.  There is a healing mid sternal fracture. The bones are demineralized. There is scoliosis.     11/05/2013   Unfortunately, the pulmonary arterial opacification is poor, markedly decreasing the sensitivity for detecting pulmonary emboli. There are no gross pulmonary emboli. There is significant enlargement of a thoracic aortic aneurysm, especially increasing in size in the ascending thoracic aorta and the proximal descending thoracic aorta. There is no gross dissection or acute hemorrhage. There is a chronic pacemaker. There is moderate LAD coronary artery calcification. There is mild worsening of the cardiomegaly. There is a healing sternal fracture. Multiple other incidental findings are detailed above.  ReadingStation:[id number]    Xr Chest Ap Portable    11/06/2013   Clinical History: chest pain  Examination: Frontal view of the chest.  Comparison: November 05, 2013.  Findings: Left chest wall pacemaker appears unchanged in position. The cardiac silhouette is enlarged. There is unchanged widening of the superior mediastinum with calcification of the aorta. No pneumothorax is identified. There is no drainable pleural effusion. No dense focal consolidation is seen. The left hemidiaphragm is elevated. Mild  scattered bony degenerative changes are seen.     11/06/2013   No appreciable interval change in the appearance of cardiomegaly, aortic aneurysm and elevation of the left hemidiaphragm.  ReadingStation:[id number]    Xr Chest Ap Portable    11/05/2013   Clinical History: chest pain  Examination: Frontal view of the chest.  Comparison: June 29, 2013  Findings:  AP upright portable chest  radiograph 1951 hours.  There is likely moderate chronic cardiomegaly considering the technique. The aorta is moderately tortuous and arteriosclerotic, with ectasia. There is no change in the left subclavian transvenous bipolar pacemaker.  There is chronic marked elevation of the left hemidiaphragm, with associated volume loss. There is no new infiltrate, mass, or pleural accumulation.  The bony structures are grossly intact.     11/05/2013   There is chronic cardiomegaly and elevation of the left hemidiaphragm. There is no change in the pacemaker. There is no definite acute pulmonary pathology on this limited portable exam.  ReadingStation:[id number]      Hospital Course:   Pt is a 73 y/o female who presented to the ED complaining of shortness of breath. See H&P for details. Her BNP was elevated. An ECHO was done and the results are pending. She has responded well to IV lasix and no longer feels short of breath. She denies chest pain and feels ready to go home. I will increase her PO lasix to 60 mg daily. Her INR was subtherapeutic at 1.5. I will increase her coumadin from 2.5 mg q hs prior to admission to 3 mg q hs, recheck INR in 1 week with results to PCP. She will follow up with her PCP in 1-2 weeks or sooner if needed. She was advised to go to the ED if her shortness of breath returns.     Filed Vitals:    11/07/13 1435   BP: 124/70   Pulse: 69   Temp: 98.1 F (36.7 C)   Resp: 17   SpO2: 98%       Physical Examination: see progress note    Discharge Medications:     Current Discharge Medication List      CONTINUE these medications which  have CHANGED    Details   furosemide (LASIX) 40 MG tablet Take 1.5 tablets (60 mg total) by mouth daily.  Qty: 45 tablet, Refills: 0      warfarin (COUMADIN) 3 MG tablet Take 1 tablet (3 mg total) by mouth daily.  Qty: 30 tablet, Refills: 0         CONTINUE these medications which have NOT CHANGED    Details   atenolol (TENORMIN) 100 MG tablet Take 100 mg by mouth daily.      calcium carbonate (OS-CAL) 600 MG Tab tablet Take 600 mg by mouth 2 (two) times daily with meals.      clonazePAM (KLONOPIN) 0.5 MG tablet Take 0.5 mg by mouth 2 (two) times daily as needed.      HYDROcodone-acetaminophen (NORCO) 5-325 MG per tablet Take 1 tablet by mouth every 6 (six) hours as needed.      potassium chloride (KLOR-CON) 20 MEQ packet Take 20 mEq by mouth 2 (two) times daily.      rosuvastatin (CRESTOR) 10 MG tablet Take 10 mg by mouth daily.      spironolactone (ALDACTONE) 50 MG tablet Take 50 mg by mouth daily.      benzonatate (TESSALON) 200 MG capsule Take 200 mg by mouth 3 (three) times daily as needed.      diltiazem (TIAZAC) 240 MG 24 hr capsule Take 240 mg by mouth daily.      estradiol (ESTRACE) 0.1 MG/GM vaginal cream Place 2 g vaginally daily.      ketoconazole (NIZORAL) 2 % cream Apply topically daily.      omeprazole (PRILOSEC) 20 MG capsule Take 20 mg by mouth daily.      ranitidine (ZANTAC)  150 MG tablet Take 150 mg by mouth 2 (two) times daily.               Discharge Instructions:     Diet: heart healthy  Activity: as tolerated    F/u w/ PCP in 1-2 weeks.   Disposition:  Home or Self Care     Minutes spent coordinating discharge and reviewing discharge plan: > 30 minutes      Signed by: Charna Elizabeth, MD  CC: Ladona Horns, MD (General)

## 2013-11-07 NOTE — Progress Notes (Signed)
PROGRESS NOTE    Date Time: 11/07/2013 11:22 AM  Patient Name: [name],[name]      Subjective:   Pt reports she is still SOB but this is much improved. Her heart rate has been elevated this am in the 110 -120 range on tele however she says she has been up taking a bath and states this happens when she's up moving around. She denies chest pain. She is normally on 2 L of O2 at home. Still feels wheezy and congested in her chest.     Medications:     Current Facility-Administered Medications   Medication Dose Route Frequency   . atenolol  100 mg Oral Daily   . clonazePAM  0.5 mg Oral BID   . diltiazem  120 mg Oral BID   . furosemide  40 mg Intravenous BID   . pantoprazole  40 mg Oral Daily   . spironolactone  50 mg Oral Daily   . warfarin  2.5 mg Oral Daily at 1800       Review of Systems:   SOB, though better, no chest pain    Physical Exam:     Filed Vitals:    11/07/13 0614   BP: 122/55   Pulse: 68   Temp: 96.3 F (35.7 C)   Resp: 17   SpO2: 98%       Intake and Output Summary (Last 24 hours) at Date Time    Intake/Output Summary (Last 24 hours) at 11/07/13 1122  Last data filed at 11/07/13 0923   Gross per 24 hour   Intake    780 ml   Output      0 ml   Net    780 ml         Gen: NAD, AOx3  Hear: RRR, no MGR  Lungs: faint wheezes bilaterally, mostly with coughing  Abd: soft, mildly tender in lower abdomen (though pt denies that her abdomen was hurting her before I pressed on it)  Ext: no pedal edema    Labs:     Results    Procedure Component Value Units Date/Time    B-type Natriuretic Peptide [[id number]]  (Abnormal) Collected:  11/07/13 0550    Specimen Information:  Blood / Blood Updated:  11/07/13 0711     B-Natriuretic Peptide 274.4 (H) pg/mL     Basic Metabolic Panel [[id number]]  (Abnormal) Collected:  11/07/13 0550    Specimen Information:  Blood / Plasma Updated:  11/07/13 0651     Sodium 139 mMol/L      Potassium 3.7 mMol/L      Chloride 100 mMol/L      CO2 27.0 mMol/L      CALCIUM 10.7 (H) mg/dL       Glucose 79 mg/dL      Creatinine 4.25 (H) mg/dL      BUN 43 (H) mg/dL      Anion Gap 95.6 mMol/L      BUN/Creatinine Ratio 30.1 (H) Ratio      EGFR 36 mL/min/1.13m2      Osmolality Calc 288 mOsm/kg     Prothrombin Time / INR [[id number]]  (Abnormal) Collected:  11/07/13 0550    Specimen Information:  Blood / Blood Updated:  11/07/13 0640     PT 15.8 (H) sec      PT INR 1.5 (H)     CBC and differential [[id number]] Collected:  11/07/13 0550    Specimen Information:  Blood / Blood Updated:  11/07/13 0630  WBC 7.2 K/cmm      RBC 4.81 M/cmm      Hemoglobin 15.0 gm/dL      Hematocrit 16.1 %      MCV 92 fL      MCH 31 pg      MCHC 34 gm/dL      RDW 09.6 %      PLT CT 142 K/cmm      MPV 7.7 fL      NEUTROPHIL % 60.1 %      Lymphocytes 23.2 %      Monocytes 13.9 %      Eosinophils % 1.9 %      Basophils % 0.9 %      Neutrophils Absolute 4.3 K/cmm      Lymphocytes Absolute 1.7 K/cmm      Monocytes Absolute 1.0 K/cmm      Eosinophils Absolute 0.1 K/cmm      BASO Absolute 0.1 K/cmm             Rads:   Radiological Procedure reviewed.    XR CHEST 2 VIEWS   Final Result   Clinical History:   SOB      Examination:   Frontal and lateral views of the chest.      Comparison:   November 06, 2013      Findings:   Cardiomegaly unchanged. No overt failure. Elevation left hemidiaphragm    unchanged. Tortuous ectatic thoracic aorta and   dual lead cardiac pacemaker unchanged. No effusion.      IMPRESSION:    No acute cardiopulmonary process, stable chronic findings as above      ReadingStation:[email or web address]      ECHOCARDIOGRAM ADULT COMPLETE W CLR/ DOPP WAVEFORM   Preliminary Result      XR Chest AP Portable   Final Result   Clinical History:   chest pain      Examination:   Frontal view of the chest.      Comparison:   November 05, 2013.      Findings:   Left chest wall pacemaker appears unchanged in position. The cardiac    silhouette is enlarged. There is unchanged widening   of the superior mediastinum with calcification of the aorta. No     pneumothorax is identified. There is no drainable   pleural effusion. No dense focal consolidation is seen. The left    hemidiaphragm is elevated. Mild scattered bony   degenerative changes are seen.      IMPRESSION:    No appreciable interval change in the appearance of cardiomegaly, aortic    aneurysm and elevation of the left   hemidiaphragm.      ReadingStation:[email or web address]      CT Angiogram Chest   Final Result   Clinical History:   sob/ inc d-dimer      Examination:   Serial axial images were obtained through the thorax with unenhanced    timing bolus evaluation of the pulmonary artery,   this was then followed by rapid infusion of IV contrast. Post enhanced    images were sent to workstation for 3-D   reconstructions including sagittal and coronal MIP acquisitions used to    confirm axial data findings.      Contrast:   Omnipaque 350, 80 mL, intravenous.      Comparison:   June 05, 2006.      Findings:   Unfortunately, there is poor pulmonary arterial opacification, and the    study is nearly nondiagnostic to  assess for   pulmonary emboli. There are no gross central pulmonary emboli. There    appears to be poor mixing of the contrast, possibly   due to poor cardiac output. There is mild to moderate cardiomegaly, mildly    worse when compared to the last exam. There   is again a bipolar pacemaker. There is aneurysmal dilation of the aorta,    but increasing when compared to the last exam.   The ascending thoracic aorta measures 6.1 cm and previously measured 5.0    cm. The proximal descending thoracic aorta   measures 4.7 cm and previously measured 3.7 cm. There is no gross    dissection, but there is nonopacification of the   aorta, limiting the sensitivity for detecting dissections. There is    moderate arteriosclerosis, without displaced   calcifications.      There is no mediastinal or hilar mass or adenopathy.      There is chronic moderate elevation of the left hemidiaphragm. There are    at least 3  low-density renal lesions which may   be cysts. There is also a tiny high density lesion in the posterior mid    left kidney, possibly a hemorrhagic cyst. There   is reflux of contrast into the hepatic veins suggesting central    congestion. There are no adrenal masses.      Lung windows reveal no endobronchial or endotracheal abnormality. There    are many tiny groundglass nodules scattered in   the lungs, mildly worse when compared to the 2008 exam, but there was some    disease on the previous study. This may be   the sequela of previous infection or an atypical infection. There is no    new infiltrate, mass, or pleural accumulation.   There is volume loss associated with the elevated left hemidiaphragm.      There is a healing mid sternal fracture. The bones are demineralized.    There is scoliosis.      IMPRESSION:    Unfortunately, the pulmonary arterial opacification is poor, markedly    decreasing the sensitivity for detecting pulmonary   emboli. There are no gross pulmonary emboli. There is significant    enlargement of a thoracic aortic aneurysm, especially   increasing in size in the ascending thoracic aorta and the proximal    descending thoracic aorta. There is no gross   dissection or acute hemorrhage. There is a chronic pacemaker. There is    moderate LAD coronary artery calcification. There   is mild worsening of the cardiomegaly. There is a healing sternal    fracture. Multiple other incidental findings are   detailed above.      ReadingStation:[email or web address]      XR Chest AP Portable   Final Result   Clinical History:   chest pain      Examination:   Frontal view of the chest.      Comparison:   June 29, 2013      Findings:      AP upright portable chest radiograph 1951 hours.      There is likely moderate chronic cardiomegaly considering the technique.    The aorta is moderately tortuous and   arteriosclerotic, with ectasia. There is no change in the left subclavian    transvenous bipolar pacemaker.       There is chronic marked elevation of the left hemidiaphragm, with    associated volume loss. There is no new infiltrate,   mass,  or pleural accumulation.      The bony structures are grossly intact.      IMPRESSION:    There is chronic cardiomegaly and elevation of the left hemidiaphragm.    There is no change in the pacemaker. There is no   definite acute pulmonary pathology on this limited portable exam.      ReadingStation:[email or web address]                Assessment/Plan:   1. Tachycardia: w/ known Afib. Pt thinks this is due to her being up walking around although when I left the room after talking to her while she was in bed her heart rate was still elevated. She apparently has gotten out of bed again so I have asked her to stay in bed so we can see what her heart rate is at rest. If this remains high I will need to adjust her meds to bring it down and keep her for monitoring. If it returns to normal I think she can go home w/ outpt f/u.   2. SOB: improving w/ IV lasix. This is most likely due to CHF, ECHO results pending.       Signed by: Charna Elizabeth, DO

## 2013-11-07 NOTE — Progress Notes (Signed)
Discharge instructions given to pt. Verbal understanding of follow up care. Patient released to family with all personal belongings.

## 2013-11-07 NOTE — UM Notes (Signed)
UR NOTE 11/07/13:     Pt reports she is still SOB but this is much improved. Her heart rate has been elevated this am in the 110 -120 range on tele however she says she has been up taking a bath and states this happens when she's up moving around. She denies chest pain. She is normally on 2 L of O2 at home. Still feels wheezy and congested in her chest. On auscultation, lung sounds with faint wheezes bilaterally.   Assessment/Plan:    1. Tachycardia: w/ known Afib. Pt thinks this is due to her being up walking around although when I left the room after talking to her while she was in bed her heart rate was still elevated. She apparently has gotten out of bed again so I have asked her to stay in bed so we can see what her heart rate is at rest. If this remains high I will need to adjust her meds to bring it down and keep her for monitoring. If it returns to normal I think she can go home w/ outpt f/u.   2. SOB: improving w/ IV lasix. This is most likely due to CHF, ECHO results pending.     VITALS: T 96.3, HR 68, R 17, BP 122/55, SATS 98%    DIAGNOSTICS: WBC 7.2, H/H 15/44.3; PT/INR 15.8/1.5; CA 10.7, CREAT 1.43, BUN 43, B/C RATIO 30.1; BNP 274.4; CXR No acute cardiopulmonary process, stable chronic findings as above    ORDERS: CBC/BMP/BNP IN AM. D/C PO LASIX, CONTINUE IV LASIX 40MG  BID    Mcg 19 th Edition: OC-024 for complaints of shortness of breath and OC-022 for telemetry/cardiac monitoring and Iv Lasix every 12 hours/Iv medications

## 2013-11-08 LAB — T4, FREE: T4 Free: 0.94 ng/dL (ref 0.70–1.48)

## 2014-03-23 ENCOUNTER — Other Ambulatory Visit: Payer: Self-pay | Admitting: Internal Medicine

## 2014-03-23 DIAGNOSIS — Z1231 Encounter for screening mammogram for malignant neoplasm of breast: Secondary | ICD-10-CM

## 2014-03-24 ENCOUNTER — Emergency Department: Payer: Medicare Other

## 2014-03-24 ENCOUNTER — Emergency Department
Admission: EM | Admit: 2014-03-24 | Discharge: 2014-03-24 | Disposition: A | Discharge status: Home / Self Care | Payer: Medicare Other | Attending: Emergency Medicine | Admitting: Emergency Medicine

## 2014-03-24 DIAGNOSIS — R319 Hematuria, unspecified: Secondary | ICD-10-CM | POA: Insufficient documentation

## 2014-03-24 DIAGNOSIS — W06XXXA Fall from bed, initial encounter: Secondary | ICD-10-CM | POA: Insufficient documentation

## 2014-03-24 DIAGNOSIS — R531 Weakness: Secondary | ICD-10-CM

## 2014-03-24 DIAGNOSIS — N390 Urinary tract infection, site not specified: Secondary | ICD-10-CM | POA: Insufficient documentation

## 2014-03-24 DIAGNOSIS — K047 Periapical abscess without sinus: Secondary | ICD-10-CM | POA: Insufficient documentation

## 2014-03-24 LAB — URINALYSIS, REFLEX TO MICROSCOPIC EXAM IF INDICATED
Bilirubin, UA: NEGATIVE mg/dL
Blood, UA: NEGATIVE mg/dL
Glucose, UA: NEGATIVE mg/dL
Nitrite, UA: POSITIVE — AB
Protein, UR: NEGATIVE mg/dL
Urine Specific Gravity: 1.025 (ref 1.001–1.040)
Urobilinogen, UA: 0.2 mg/dL
pH, Urine: 5 pH (ref 5.0–8.0)

## 2014-03-24 LAB — CBC AND DIFFERENTIAL
Basophils %: 0.5 % (ref 0.0–3.0)
Basophils Absolute: 0 10*3/uL (ref 0.0–0.3)
Eosinophils %: 1.5 % (ref 0.0–7.0)
Eosinophils Absolute: 0.1 10*3/uL (ref 0.0–0.8)
Hematocrit: 34.9 % — ABNORMAL LOW (ref 36.0–48.0)
Hemoglobin: 12.2 gm/dL (ref 12.0–16.0)
Lymphocytes Absolute: 1.2 10*3/uL (ref 0.6–5.1)
Lymphocytes: 19.9 % (ref 15.0–46.0)
MCH: 32 pg (ref 28–35)
MCHC: 35 gm/dL (ref 31–36)
MCV: 93 fL (ref 80–100)
MPV: 8.3 fL (ref 6.0–10.0)
Monocytes Absolute: 0.7 10*3/uL (ref 0.1–1.7)
Monocytes: 12.3 % (ref 3.0–15.0)
Neutrophils %: 65.9 % (ref 42.0–78.0)
Neutrophils Absolute: 3.9 10*3/uL (ref 1.7–8.6)
PLT CT: 104 10*3/uL — ABNORMAL LOW (ref 130–440)
RBC: 3.78 10*6/uL — ABNORMAL LOW (ref 3.80–5.00)
RDW: 12.3 % (ref 10.5–14.5)
WBC: 5.9 10*3/uL (ref 4.00–11.00)

## 2014-03-24 LAB — COMPREHENSIVE METABOLIC PANEL
ALT: 25 U/L (ref 0–55)
AST (SGOT): 26 U/L (ref 10–42)
Albumin/Globulin Ratio: 1.59 Ratio — ABNORMAL HIGH (ref 0.70–1.50)
Albumin: 3.8 gm/dL (ref 3.5–5.0)
Alkaline Phosphatase: 96 U/L (ref 40–145)
Anion Gap: 10.7 mMol/L (ref 7.0–18.0)
BUN / Creatinine Ratio: 27.6 Ratio (ref 10.0–30.0)
BUN: 42 mg/dL — ABNORMAL HIGH (ref 7–22)
Bilirubin, Total: 0.5 mg/dL (ref 0.1–1.2)
CO2: 24 mMol/L (ref 20.0–30.0)
Calcium: 10.2 mg/dL (ref 8.5–10.5)
Chloride: 106 mMol/L (ref 98–110)
Creatinine: 1.52 mg/dL — ABNORMAL HIGH (ref 0.60–1.20)
EGFR: 33 mL/min/{1.73_m2}
Globulin: 2.4 gm/dL (ref 2.0–4.0)
Glucose: 99 mg/dL (ref 70–99)
Osmolality Calc: 281 mOsm/kg (ref 275–300)
Potassium: 5.3 mMol/L (ref 3.5–5.3)
Protein, Total: 6.1 gm/dL (ref 6.0–8.3)
Sodium: 135 mMol/L — ABNORMAL LOW (ref 136–147)

## 2014-03-24 LAB — TROPONIN I: Troponin I: 0.02 ng/mL (ref 0.00–0.02)

## 2014-03-24 LAB — PT/INR
PT INR: 2.7 — ABNORMAL HIGH (ref 0.8–1.2)
PT: 29.8 s — ABNORMAL HIGH (ref 9.4–12.5)

## 2014-03-24 MED ORDER — CEPHALEXIN 250 MG PO TABS
250.0000 mg | ORAL_TABLET | Freq: Three times a day (TID) | ORAL | Status: DC
Start: 2014-03-24 — End: 2015-03-22

## 2014-03-24 NOTE — Discharge Instructions (Signed)
STOP the losartan.  Bring ALL of your meds to see your doctor within the next few days to go over stopping some of your BP meds.  Return if any concerns.

## 2014-03-24 NOTE — ED Provider Notes (Signed)
Physician/Midlevel provider first contact with patient: 03/24/14 1719         History     Chief Complaint   Patient presents with   . Fall     HPI Pt slipped out of her bed and just couldn't get herself back up.  She has genl weakness. This has happened before.  She did not hit head, no loc. NO: cp, sob(chronic, no change, on home O2), n/v/d/abd pain.  She has had over 48 hours of continual pain in her right jaw.  No chp, neck pain, arm pain. No diaph.    Daughter says she is on over 4 meds for BP and her BP is consistently running LESS than 100syst.    Nursing (triage) note reviewed for the following pertinent information:    DAUGHTER FOUND PATIENT ON FLOOR. PATIENT SAID SHE HAD BEEN THERE FOR OVER 1 HOUR WHEN SHE WAS FOUND. PT SAYS SHE HAD WEAKNESS TODAY, AND HAS HAD RIGHT JAW PAIN FOR DAYS. HAS PACEMAKER THAT WAS JUST CHECKED BY CARDIOLOGIST 4 DAYS AGO AND TOLD EVERYTHING WAS FINE.    Past Medical History   Diagnosis Date   . Atrial fibrillation    . Malignant neoplasm    . Pacemaker    . Hypertension    . Aortic aneurysm    . Chronic obstructive pulmonary disease        Past Surgical History   Procedure Laterality Date   . Hysterectomy     . Cardiac pacemaker placement       2002       Family History   Problem Relation Age of Onset   . Breast cancer Sister        Social  History   Substance Use Topics   . Smoking status: Never Smoker    . Smokeless tobacco: Not on file   . Alcohol Use: No       .     Allergies   Allergen Reactions   . Codeine    . Morphine    . Nitroglycerin    . Pain Patch [Menthol]        Current/Home Medications    ATENOLOL (TENORMIN) 100 MG TABLET    Take 100 mg by mouth daily.    BENZONATATE (TESSALON) 200 MG CAPSULE    Take 200 mg by mouth 3 (three) times daily as needed.    CALCIUM CARBONATE (OS-CAL) 600 MG TAB TABLET    Take 600 mg by mouth 2 (two) times daily with meals.    CLONAZEPAM (KLONOPIN) 0.5 MG TABLET    Take 0.5 mg by mouth 2 (two) times daily as needed.    DILTIAZEM (TIAZAC)  240 MG 24 HR CAPSULE    Take 240 mg by mouth daily.    ESTRADIOL (ESTRACE) 0.1 MG/GM VAGINAL CREAM    Place 2 g vaginally daily.    FUROSEMIDE (LASIX) 40 MG TABLET    Take 1.5 tablets (60 mg total) by mouth daily.    HYDROCODONE-ACETAMINOPHEN (NORCO) 5-325 MG PER TABLET    Take 1 tablet by mouth every 6 (six) hours as needed.    KETOCONAZOLE (NIZORAL) 2 % CREAM    Apply topically daily.    LOSARTAN (COZAAR) 50 MG TABLET        OMEPRAZOLE (PRILOSEC) 20 MG CAPSULE    Take 20 mg by mouth daily.    POTASSIUM CHLORIDE (KLOR-CON) 20 MEQ PACKET    Take 20 mEq by mouth 2 (two) times daily.  RANITIDINE (ZANTAC) 150 MG TABLET    Take 150 mg by mouth 2 (two) times daily.    ROSUVASTATIN (CRESTOR) 10 MG TABLET    Take 10 mg by mouth daily.    SPIRONOLACTONE (ALDACTONE) 50 MG TABLET    Take 50 mg by mouth daily.    WARFARIN (COUMADIN) 3 MG TABLET    Take 1 tablet (3 mg total) by mouth daily.        Review of Systems   HENT: Positive for dental problem. Negative for trouble swallowing.    Respiratory: Negative for cough, shortness of breath and wheezing.    Cardiovascular: Negative for chest pain, palpitations and leg swelling.   Gastrointestinal: Negative.  Negative for nausea, vomiting, abdominal pain and diarrhea.   Genitourinary: Negative.    Musculoskeletal: Negative.    Skin: Negative.    Neurological: Positive for weakness. Negative for dizziness, syncope, facial asymmetry, speech difficulty, light-headedness and headaches.   All other systems reviewed and are negative.      Physical Exam    BP: 97/44 mmHg, Heart Rate: 60, Temp: 97.5 F (36.4 C), Resp Rate: 16, SpO2: 99 %, Weight: 77.565 kg    Physical Exam   Constitutional: She is oriented to person, place, and time. She appears well-developed and well-nourished. No distress.   HENT:   Head: Normocephalic.   Mouth/Throat: Oropharynx is clear and moist.   Tenderness to palp of her right sided dentition, but it may be pain in her 'cheek'. She is very indistinct on this.    Eyes: EOM are normal.   Neck: Normal range of motion. Neck supple.   Cardiovascular: Normal rate and regular rhythm.    Pulmonary/Chest: Effort normal and breath sounds normal. No respiratory distress. She has no wheezes. She has no rales.   Abdominal: Soft. Bowel sounds are normal. There is no tenderness.   Musculoskeletal: Normal range of motion.   Neurological: She is alert and oriented to person, place, and time. She has normal reflexes. No cranial nerve deficit. She exhibits normal muscle tone.   Good strength in legs and arms, symmetric   Skin: Skin is warm and dry. She is not diaphoretic.   Nursing note and vitals reviewed.        MDM and ED Course     ED Medication Orders     None              MDM  Number of Diagnoses or Management Options  Dental infection:   Urinary tract infection with hematuria, site unspecified:   Diagnosis management comments: Pt has been weak and falling a lot and is having BP in the <100syst for weeks.  Is apparantly on at least 4 bp meds including hi dose cardiazem, atenolo, aldactone, and daughter states losartan.  Ur is pos for infect.  I think she also has a right dental infection.  Will rx these with keflex.  I will ask them to stop losartan and see dr Fransisca Kaufmann within the next few days to go over all of her meds.       Amount and/or Complexity of Data Reviewed  Clinical lab tests: ordered and reviewed  Tests in the radiology section of CPT: ordered and reviewed    Risk of Complications, Morbidity, and/or Mortality  Presenting problems: moderate  Diagnostic procedures: minimal  Management options: minimal           Procedures    Clinical Impression & Disposition     Clinical Impression  Final  diagnoses:   Urinary tract infection with hematuria, site unspecified   Dental infection        ED Disposition     Discharge [facility] discharge to home/self care.    Condition at disposition: Stable             New Prescriptions    CEPHALEXIN 250 MG TABLET    Take 250 mg by  mouth 3 (three) times daily.                   Johney Maine, MD  03/24/14 [phone]

## 2014-03-30 DIAGNOSIS — R319 Hematuria, unspecified: Secondary | ICD-10-CM | POA: Insufficient documentation

## 2014-04-01 ENCOUNTER — Ambulatory Visit: Payer: Medicare Other

## 2014-06-25 ENCOUNTER — Ambulatory Visit
Admission: RE | Admit: 2014-06-25 | Discharge: 2014-06-25 | Disposition: A | Discharge status: Home / Self Care | Payer: Medicare Other | Source: Ambulatory Visit | Attending: Internal Medicine | Admitting: Internal Medicine

## 2014-06-25 DIAGNOSIS — Z1231 Encounter for screening mammogram for malignant neoplasm of breast: Secondary | ICD-10-CM | POA: Insufficient documentation

## 2014-06-25 DIAGNOSIS — N6489 Other specified disorders of breast: Secondary | ICD-10-CM | POA: Insufficient documentation

## 2014-06-26 ENCOUNTER — Other Ambulatory Visit: Payer: Self-pay | Admitting: Internal Medicine

## 2014-06-26 DIAGNOSIS — N6489 Other specified disorders of breast: Secondary | ICD-10-CM

## 2014-06-26 DIAGNOSIS — R928 Other abnormal and inconclusive findings on diagnostic imaging of breast: Secondary | ICD-10-CM

## 2014-07-16 ENCOUNTER — Other Ambulatory Visit: Payer: Medicare Other

## 2014-07-20 ENCOUNTER — Other Ambulatory Visit: Payer: Medicare Other

## 2014-07-23 ENCOUNTER — Ambulatory Visit
Admission: RE | Admit: 2014-07-23 | Discharge: 2014-07-23 | Disposition: A | Discharge status: Home / Self Care | Payer: Medicare Other | Source: Ambulatory Visit | Attending: Internal Medicine | Admitting: Internal Medicine

## 2014-07-23 DIAGNOSIS — N6489 Other specified disorders of breast: Secondary | ICD-10-CM

## 2014-07-23 DIAGNOSIS — R928 Other abnormal and inconclusive findings on diagnostic imaging of breast: Secondary | ICD-10-CM | POA: Insufficient documentation

## 2014-08-24 ENCOUNTER — Inpatient Hospital Stay
Admission: RE | Admit: 2014-08-24 | Discharge: 2014-08-27 | DRG: 293 | Disposition: A | Discharge status: Home / Self Care | Payer: Medicare Other | Attending: Family Medicine | Admitting: Family Medicine

## 2014-08-24 ENCOUNTER — Inpatient Hospital Stay: Payer: Medicare Other | Admitting: Family Medicine

## 2014-08-24 ENCOUNTER — Emergency Department: Payer: Medicare Other

## 2014-08-24 DIAGNOSIS — I480 Paroxysmal atrial fibrillation: Secondary | ICD-10-CM | POA: Diagnosis present

## 2014-08-24 DIAGNOSIS — Z90710 Acquired absence of both cervix and uterus: Secondary | ICD-10-CM

## 2014-08-24 DIAGNOSIS — I5032 Chronic diastolic (congestive) heart failure: Secondary | ICD-10-CM | POA: Diagnosis present

## 2014-08-24 DIAGNOSIS — N189 Chronic kidney disease, unspecified: Secondary | ICD-10-CM | POA: Diagnosis present

## 2014-08-24 DIAGNOSIS — I509 Heart failure, unspecified: Secondary | ICD-10-CM

## 2014-08-24 DIAGNOSIS — I5022 Chronic systolic (congestive) heart failure: Secondary | ICD-10-CM

## 2014-08-24 DIAGNOSIS — Z803 Family history of malignant neoplasm of breast: Secondary | ICD-10-CM

## 2014-08-24 DIAGNOSIS — J449 Chronic obstructive pulmonary disease, unspecified: Secondary | ICD-10-CM | POA: Diagnosis present

## 2014-08-24 DIAGNOSIS — E875 Hyperkalemia: Secondary | ICD-10-CM | POA: Diagnosis present

## 2014-08-24 DIAGNOSIS — Z7901 Long term (current) use of anticoagulants: Secondary | ICD-10-CM

## 2014-08-24 DIAGNOSIS — I351 Nonrheumatic aortic (valve) insufficiency: Secondary | ICD-10-CM | POA: Diagnosis present

## 2014-08-24 DIAGNOSIS — Z9981 Dependence on supplemental oxygen: Secondary | ICD-10-CM

## 2014-08-24 DIAGNOSIS — I4821 Permanent atrial fibrillation: Secondary | ICD-10-CM | POA: Diagnosis present

## 2014-08-24 DIAGNOSIS — Z950 Presence of cardiac pacemaker: Secondary | ICD-10-CM

## 2014-08-24 DIAGNOSIS — I129 Hypertensive chronic kidney disease with stage 1 through stage 4 chronic kidney disease, or unspecified chronic kidney disease: Secondary | ICD-10-CM | POA: Diagnosis present

## 2014-08-24 DIAGNOSIS — I5033 Acute on chronic diastolic (congestive) heart failure: Principal | ICD-10-CM | POA: Diagnosis present

## 2014-08-24 DIAGNOSIS — K5900 Constipation, unspecified: Secondary | ICD-10-CM | POA: Diagnosis not present

## 2014-08-24 DIAGNOSIS — I719 Aortic aneurysm of unspecified site, without rupture: Secondary | ICD-10-CM | POA: Diagnosis present

## 2014-08-24 DIAGNOSIS — Z859 Personal history of malignant neoplasm, unspecified: Secondary | ICD-10-CM

## 2014-08-24 DIAGNOSIS — Z885 Allergy status to narcotic agent status: Secondary | ICD-10-CM

## 2014-08-24 LAB — COMPREHENSIVE METABOLIC PANEL
ALT: 4 U/L (ref 0–55)
AST (SGOT): 20 U/L (ref 10–42)
Albumin/Globulin Ratio: 1.68 Ratio — ABNORMAL HIGH (ref 0.70–1.50)
Albumin: 3.9 gm/dL (ref 3.5–5.0)
Alkaline Phosphatase: 127 U/L (ref 40–145)
Anion Gap: 15.2 mMol/L (ref 7.0–18.0)
BUN / Creatinine Ratio: 23.8 Ratio (ref 10.0–30.0)
BUN: 35 mg/dL — ABNORMAL HIGH (ref 7–22)
Bilirubin, Total: 0.7 mg/dL (ref 0.1–1.2)
CO2: 23 mMol/L (ref 20.0–30.0)
Calcium: 10.1 mg/dL (ref 8.5–10.5)
Chloride: 106 mMol/L (ref 98–110)
Creatinine: 1.47 mg/dL — ABNORMAL HIGH (ref 0.60–1.20)
EGFR: 35 mL/min/{1.73_m2}
Globulin: 2.3 gm/dL (ref 2.0–4.0)
Glucose: 110 mg/dL — ABNORMAL HIGH (ref 70–99)
Osmolality Calc: 284 mOsm/kg (ref 275–300)
Potassium: 6.4 mMol/L (ref 3.5–5.3)
Protein, Total: 6.2 gm/dL (ref 6.0–8.3)
Sodium: 138 mMol/L (ref 136–147)

## 2014-08-24 LAB — CBC AND DIFFERENTIAL
Basophils %: 0.5 % (ref 0.0–3.0)
Basophils Absolute: 0 10*3/uL (ref 0.0–0.3)
Eosinophils %: 0.6 % (ref 0.0–7.0)
Eosinophils Absolute: 0 10*3/uL (ref 0.0–0.8)
Hematocrit: 40 % (ref 36.0–48.0)
Hemoglobin: 12.9 gm/dL (ref 12.0–16.0)
Lymphocytes Absolute: 1.1 10*3/uL (ref 0.6–5.1)
Lymphocytes: 14.4 % — ABNORMAL LOW (ref 15.0–46.0)
MCH: 30 pg (ref 28–35)
MCHC: 32 gm/dL (ref 31–36)
MCV: 94 fL (ref 80–100)
MPV: 12 fL — ABNORMAL HIGH (ref 6.0–10.0)
Monocytes Absolute: 0.6 10*3/uL (ref 0.1–1.7)
Monocytes: 7.9 % (ref 3.0–15.0)
Neutrophils %: 76.7 % (ref 42.0–78.0)
Neutrophils Absolute: 5.7 10*3/uL (ref 1.7–8.6)
PLT CT: 93 10*3/uL — ABNORMAL LOW (ref 130–440)
RBC: 4.26 10*6/uL (ref 3.80–5.00)
RDW: 12.8 % (ref 10.5–14.5)
WBC: 7.5 10*3/uL (ref 4.00–11.00)

## 2014-08-24 LAB — B-TYPE NATRIURETIC PEPTIDE: B-Natriuretic Peptide: 409.7 pg/mL — ABNORMAL HIGH (ref 0.0–100.0)

## 2014-08-24 LAB — PT/INR
PT INR: 3.8 — ABNORMAL HIGH (ref 0.8–1.2)
PT: 42.5 s — ABNORMAL HIGH (ref 9.4–12.5)

## 2014-08-24 LAB — POTASSIUM
Potassium: 6.4 mMol/L (ref 3.5–5.3)
Potassium: 4.4 mMol/L (ref 3.5–5.3)

## 2014-08-24 LAB — TROPONIN I: Troponin I: <0.01 ng/mL (ref 0.00–0.02)

## 2014-08-24 MED ORDER — SODIUM CHLORIDE 0.9 % IJ SOLN
3.0000 mL | Freq: Three times a day (TID) | INTRAMUSCULAR | Status: DC
Start: 2014-08-24 — End: 2014-08-27
  Administered 2014-08-24 – 2014-08-27 (×7): 3 mL via INTRAVENOUS

## 2014-08-24 MED ORDER — ALBUTEROL SULFATE (2.5 MG/3ML) 0.083% IN NEBU
2.5000 mg | INHALATION_SOLUTION | Freq: Four times a day (QID) | RESPIRATORY_TRACT | Status: DC
Start: 2014-08-24 — End: 2014-08-27
  Administered 2014-08-24 – 2014-08-27 (×9): 2.5 mg via RESPIRATORY_TRACT
  Filled 2014-08-24 (×9): qty 3

## 2014-08-24 MED ORDER — WARFARIN SODIUM 1 MG PO TABS
1.0000 mg | ORAL_TABLET | Freq: Every day | ORAL | Status: AC
Start: 2014-08-26 — End: 2014-08-26
  Administered 2014-08-26: 1 mg via ORAL
  Filled 2014-08-24: qty 1

## 2014-08-24 MED ORDER — WARFARIN SODIUM 1 MG PO TABS
1.0000 mg | ORAL_TABLET | Freq: Every day | ORAL | Status: DC
Start: 2014-08-29 — End: 2014-08-27

## 2014-08-24 MED ORDER — WARFARIN SODIUM 2.5 MG PO TABS
2.5000 mg | ORAL_TABLET | Freq: Every day | ORAL | Status: DC
Start: 2014-08-28 — End: 2014-08-27

## 2014-08-24 MED ORDER — ATENOLOL 50 MG PO TABS
100.0000 mg | ORAL_TABLET | Freq: Every day | ORAL | Status: DC
Start: 2014-08-24 — End: 2014-08-27
  Administered 2014-08-24 – 2014-08-27 (×4): 100 mg via ORAL
  Filled 2014-08-24 (×4): qty 2

## 2014-08-24 MED ORDER — ACETAMINOPHEN 325 MG PO TABS
650.0000 mg | ORAL_TABLET | ORAL | Status: DC | PRN
Start: 2014-08-24 — End: 2014-08-27

## 2014-08-24 MED ORDER — FAMOTIDINE 20 MG PO TABS
20.0000 mg | ORAL_TABLET | Freq: Two times a day (BID) | ORAL | Status: DC
Start: 2014-08-24 — End: 2014-08-27
  Administered 2014-08-24 – 2014-08-27 (×6): 20 mg via ORAL
  Filled 2014-08-24 (×6): qty 1

## 2014-08-24 MED ORDER — ACETAMINOPHEN 160 MG/5ML PO SOLN
650.0000 mg | ORAL | Status: DC | PRN
Start: 2014-08-24 — End: 2014-08-27

## 2014-08-24 MED ORDER — WARFARIN SODIUM 2.5 MG PO TABS
2.5000 mg | ORAL_TABLET | Freq: Every day | ORAL | Status: DC
Start: 2014-08-27 — End: 2014-08-27

## 2014-08-24 MED ORDER — WARFARIN SODIUM 2.5 MG PO TABS
2.5000 mg | ORAL_TABLET | Freq: Every day | ORAL | Status: AC
Start: 2014-08-24 — End: 2014-08-26
  Administered 2014-08-25: 2.5 mg via ORAL
  Filled 2014-08-24: qty 1

## 2014-08-24 MED ORDER — HYDROCODONE-ACETAMINOPHEN 5-325 MG PO TABS
1.0000 | ORAL_TABLET | Freq: Four times a day (QID) | ORAL | Status: DC | PRN
Start: 2014-08-24 — End: 2014-08-27
  Administered 2014-08-24 – 2014-08-26 (×3): 1 via ORAL
  Filled 2014-08-24 (×3): qty 1

## 2014-08-24 MED ORDER — FUROSEMIDE 10 MG/ML IJ SOLN
40.0000 mg | Freq: Two times a day (BID) | INTRAMUSCULAR | Status: AC
Start: 2014-08-25 — End: 2014-08-26
  Administered 2014-08-25 – 2014-08-26 (×4): 40 mg via INTRAVENOUS
  Filled 2014-08-24 (×4): qty 4

## 2014-08-24 MED ORDER — BENZONATATE 100 MG PO CAPS
200.0000 mg | ORAL_CAPSULE | ORAL | Status: DC | PRN
Start: 2014-08-24 — End: 2014-08-27

## 2014-08-24 MED ORDER — ROSUVASTATIN CALCIUM 10 MG PO TABS
10.0000 mg | ORAL_TABLET | Freq: Every evening | ORAL | Status: DC
Start: 2014-08-24 — End: 2014-08-27
  Administered 2014-08-24 – 2014-08-26 (×3): 10 mg via ORAL
  Filled 2014-08-24 (×3): qty 1

## 2014-08-24 MED ORDER — DILTIAZEM HCL ER COATED BEADS 120 MG PO CP24
240.0000 mg | ORAL_CAPSULE | Freq: Every day | ORAL | Status: DC
Start: 2014-08-24 — End: 2014-08-27
  Administered 2014-08-24 – 2014-08-27 (×4): 240 mg via ORAL
  Filled 2014-08-24 (×4): qty 2

## 2014-08-24 MED ORDER — ACETAMINOPHEN 650 MG RE SUPP
650.0000 mg | RECTAL | Status: DC | PRN
Start: 2014-08-24 — End: 2014-08-27

## 2014-08-24 MED ORDER — FUROSEMIDE 10 MG/ML IJ SOLN
INTRAMUSCULAR | Status: AC
Start: 2014-08-24 — End: ?
  Filled 2014-08-24: qty 4

## 2014-08-24 MED ORDER — SODIUM POLYSTYRENE SULFONATE 15 GM/60ML PO SUSP
ORAL | Status: AC
Start: 2014-08-24 — End: ?
  Filled 2014-08-24: qty 60

## 2014-08-24 MED ORDER — FUROSEMIDE 10 MG/ML IJ SOLN
40.0000 mg | Freq: Once | INTRAMUSCULAR | Status: AC
Start: 2014-08-24 — End: 2014-08-24
  Administered 2014-08-24: 40 mg via INTRAVENOUS

## 2014-08-24 MED ORDER — LOSARTAN POTASSIUM 50 MG PO TABS
50.0000 mg | ORAL_TABLET | Freq: Every day | ORAL | Status: DC
Start: 2014-08-24 — End: 2014-08-25
  Administered 2014-08-25: 50 mg via ORAL
  Filled 2014-08-24: qty 1

## 2014-08-24 MED ORDER — SODIUM POLYSTYRENE SULFONATE 15 GM/60ML PO SUSP
15.0000 g | Freq: Two times a day (BID) | ORAL | Status: DC
Start: 2014-08-24 — End: 2014-08-25
  Administered 2014-08-24: 15 g via ORAL

## 2014-08-24 MED ORDER — CLONAZEPAM 0.5 MG PO TABS
0.5000 mg | ORAL_TABLET | Freq: Two times a day (BID) | ORAL | Status: DC | PRN
Start: 2014-08-24 — End: 2014-08-26

## 2014-08-24 NOTE — Plan of Care (Signed)
Problem: Safety  Goal: Patient will be free from injury during hospitalization  Outcome: Progressing

## 2014-08-24 NOTE — H&P (Signed)
ADMISSION HISTORY AND PHYSICAL EXAM    Date Time: 08/24/2014 6:53 PM  Patient Name: [name]  Attending Physician: Lesleigh Noe, MD      History of Present Illness:   [name] is a 74 y.o. female with a history of CHF and Afib with a pacemaker in place who presents to the hospital with increasing dyspnea on exertion since falling in the shower 10 days ago.  She notes that she fell on her buttocks and had significant pain initially which has been improving and is currently not in pain.  She notes that in addition to DOE she is experiencing orthopnea and dry cough.  With her dry cough she occasionally has some chest pain but denies CP otherwise.  She denies fever, chills, night sweats, or recent illness.  She denies n/v/d, abdominal pain or urinary symptoms.  She denies hemoptysis.  She is maintained on coumadin for her chronic Afib.      Past Medical History:     Past Medical History   Diagnosis Date   . Atrial fibrillation    . Malignant neoplasm    . Pacemaker    . Hypertension    . Aortic aneurysm    . Chronic obstructive pulmonary disease        Past Surgical History:     Past Surgical History   Procedure Laterality Date   . Hysterectomy     . Cardiac pacemaker placement       2002       Family History:     Family History   Problem Relation Age of Onset   . Breast cancer Sister        Social History:     History     Social History   . Marital Status: Widowed     Spouse Name: N/A   . Number of Children: N/A   . Years of Education: N/A     Social History Main Topics   . Smoking status: Never Smoker    . Smokeless tobacco: Not on file   . Alcohol Use: No   . Drug Use: No   . Sexual Activity: Not on file     Other Topics Concern   . Not on file     Social History Narrative       Allergies:     Allergies   Allergen Reactions   . Codeine    . Morphine    . Nitroglycerin    . Pain Patch [Menthol]        Medications:     Prescriptions prior to admission   Medication Sig   . albuterol  (PROVENTIL) (2.5 MG/3ML) 0.083% nebulizer solution Take 2.5 mg by nebulization every 6 (six) hours as needed for Wheezing.   Marland Kitchen atenolol (TENORMIN) 100 MG tablet Take 100 mg by mouth daily.   . calcium carbonate (OS-CAL) 600 MG Tab tablet Take 600 mg by mouth 2 (two) times daily with meals.   . Cephalexin 250 MG tablet Take 250 mg by mouth 3 (three) times daily.   . clonazePAM (KLONOPIN) 0.5 MG tablet Take 0.5 mg by mouth 2 (two) times daily as needed.   . diltiazem (TIAZAC) 240 MG 24 hr capsule Take 240 mg by mouth daily.   Marland Kitchen estradiol (ESTRACE) 0.1 MG/GM vaginal cream Place 2 g vaginally daily.   . furosemide (LASIX) 40 MG tablet Take 1.5 tablets (60 mg total) by mouth daily.   Marland Kitchen HYDROcodone-acetaminophen (NORCO) 5-325 MG per tablet  Take 1 tablet by mouth every 6 (six) hours as needed.   Marland Kitchen omeprazole (PRILOSEC) 20 MG capsule Take 20 mg by mouth daily.   . potassium chloride (KLOR-CON) 20 MEQ packet Take 20 mEq by mouth 2 (two) times daily.   . rosuvastatin (CRESTOR) 10 MG tablet Take 10 mg by mouth daily.   Marland Kitchen spironolactone (ALDACTONE) 50 MG tablet Take 50 mg by mouth daily.   Marland Kitchen warfarin (COUMADIN) 3 MG tablet Take 1 tablet (3 mg total) by mouth daily. (Patient taking differently: Take 2.5 mg by mouth daily.   )   . benzonatate (TESSALON) 200 MG capsule Take 200 mg by mouth 3 (three) times daily as needed.   Marland Kitchen ketoconazole (NIZORAL) 2 % cream Apply topically daily.       Review of Systems:   A comprehensive review of systems was: History obtained from the patient  General ROS: negative for - chills, fever or night sweats  ENT ROS: negative for - headaches, nasal congestion, sinus pain or sore throat  Respiratory ROS: positive for - cough, orthopnea and shortness of breath  negative for - hemoptysis, pleuritic pain, sputum changes or wheezing  Cardiovascular ROS: positive for - dyspnea on exertion, orthopnea and shortness of breath  negative for - chest pain, edema, irregular heartbeat, murmur or paroxysmal  nocturnal dyspnea  Gastrointestinal ROS: no abdominal pain, change in bowel habits, or black or bloody stools  Genito-Urinary ROS: no dysuria, trouble voiding, or hematuria  Neurological ROS: no TIA or stroke symptoms    Physical Exam:     Filed Vitals:    08/24/14 1826   BP: 152/70   Pulse: 57   Temp: 98.1 F (36.7 C)   Resp: 21   SpO2: 93%       Intake and Output Summary (Last 24 hours) at Date Time  No intake or output data in the 24 hours ending 08/24/14 1853    General: NAD, alert and oriented to person, place and time  Eyes:  PERRLA and EOM intact  ENT: No erythema in oropharynx.  No coryza.  Neck: No cervical adenopathy, no carotid bruit, no JVD.  Heart: regular rate and rhythm, no MRG  Lungs: good respiratory effort, clear to auscultation bilaterally, no wheezes, rhales or rhonchi    Abdomen: Normal bowel sounds, abdomen soft, non-distended, non-tender.  Extremities: No edema, cyanosis or clubbing  Musculoskeletal: Strength 5/5, full AROM, no joint swelling or erythema.  Neurological:  CN II-XII grossly intact, DTRs 2+, sensation intact.  Skin: warm and dry, no rashes or erythema.         Labs:     Results     Procedure Component Value Units Date/Time    Potassium [[id number]]  (Abnormal) Collected:  08/24/14 1510    Specimen Information:  Plasma Updated:  08/24/14 1536     Potassium 6.4 (HH) mMol/L     BNP [[id number]]  (Abnormal) Collected:  08/24/14 1415    Specimen Information:  Blood Updated:  08/24/14 1450     B-Natriuretic Peptide 409.7 (H) pg/mL     CMP [[id number]]  (Abnormal) Collected:  08/24/14 1415    Specimen Information:  Plasma Updated:  08/24/14 1447     Sodium 138 mMol/L      Potassium 6.4 (HH) mMol/L      Chloride 106 mMol/L      CO2 23.0 mMol/L      CALCIUM 10.1 mg/dL      Glucose 962 (H) mg/dL  Creatinine 1.47 (H) mg/dL      BUN 35 (H) mg/dL      Protein, Total 6.2 gm/dL      Albumin 3.9 gm/dL      Alkaline Phosphatase 127 U/L      ALT 4 U/L      AST (SGOT) 20 U/L      Bilirubin, Total  0.7 mg/dL      Albumin/Globulin Ratio 1.68 (H) Ratio      Anion Gap 15.2 mMol/L      BUN/Creatinine Ratio 23.8 Ratio      EGFR 35 mL/min/1.68m2      Osmolality Calc 284 mOsm/kg      Globulin 2.3 gm/dL     Troponin I (Stat) [[id number]] Collected:  08/24/14 1415    Specimen Information:  Plasma Updated:  08/24/14 1447     Troponin I <0.01 ng/mL     PT/INR [[id number]]  (Abnormal) Collected:  08/24/14 1415    Specimen Information:  Blood Updated:  08/24/14 1436     PT 42.5 (H) sec      PT INR 3.8 (H)     CBC [[id number]]  (Abnormal) Collected:  08/24/14 1415    Specimen Information:  Blood / Blood Updated:  08/24/14 1424     WBC 7.5 K/cmm      RBC 4.26 M/cmm      Hemoglobin 12.9 gm/dL      Hematocrit 08.6 %      MCV 94 fL      MCH 30 pg      MCHC 32 gm/dL      RDW 57.8 %      PLT CT 93 (L) K/cmm      MPV 12.0 (H) fL      NEUTROPHIL % 76.7 %      Lymphocytes 14.4 (L) %      Monocytes 7.9 %      Eosinophils % 0.6 %      Basophils % 0.5 %      Neutrophils Absolute 5.7 K/cmm      Lymphocytes Absolute 1.1 K/cmm      Monocytes Absolute 0.6 K/cmm      Eosinophils Absolute 0.0 K/cmm      BASO Absolute 0.0 K/cmm             Rads:   Radiological Procedure reviewed.    Radiology Results (24 Hour)     Procedure Component Value Units Date/Time    XR Chest AP Portable [[id number]] Collected:  08/24/14 1417    Order Status:  Completed Updated:  08/24/14 1420    Narrative:      Clinical History:  Shortness of Breath    Examination:  Frontal view of the chest.    Comparison:  March 24, 2014    Findings:  Pacemaker appears unchanged in position. There is unchanged cardiomegaly. Aortic calcifications are seen. The aorta  appears dilated or tortuous. No pneumothorax or drainable effusion is seen. There is chronic elevation of the left  hemidiaphragm. No focal consolidation is identified. The regional osseous structures appear intact.      Impression:      1.  There is cardiomegaly with a tortuous or dilated aorta.  2.  Chronic left  hemidiaphragmatic elevation.    ReadingStation:[id number]          Assessment and Plan:     Active Hospital Problems    Diagnosis   . Hyperkalemia   . CHF (congestive heart failure)  [name] is a 74 yo female who presents with exacerbation of CHF as well as hyperkalemia.  Given her significant symptoms and significantly elevated serum K, it is prudent to admit her for inpatient treatment and monitoring.  It is likely that her hyperkalemia is a result of concurrent potassium sparing diuretics and potassium supplementation in the setting of CKD.  While inpatient we will hold her spironolactone and potassium supplementation.  She will be placed on 40mg  of IV lasix BID for both her CHF exacerbation and for her hyperkalemia.  We will also give two doses of kayexalate as given her poor renal function she may not clear the potassium quickly with isolated lasix therapy.  Will recheck her potassium around 10PM.  Will recheck CBC and CMP in the morning.  Will perform close I&O as well as daily weights to monitor effects of lasix.           Signed by: Neva Seat PA-C  I have personally seen and evaluated this pleasant lady, reviewed the history and documentation as well as treatment plan and concur with findings and plan as noted above. Plan to patient on inpatient status for IV diuretics and close monitoring of electrolytes and careful resumption of medications as indicated.        I hereby certify this patient for hospitalization based upon medical necessity as noted above.    Repeat Potassium this evening came back at 4.4.  Will d/c second dose of kayexalate.

## 2014-08-24 NOTE — ED Provider Notes (Addendum)
[facility]   EMERGENCY DEPARTMENT HISTORY AND PHYSICAL EXAM    Date: 08/24/2014  Patient Name: [name] [name]  Attending Physician:E Selinda Orion, MD  Patient DOB:  13-Feb-1941  MRN:  [id number]  Room:  ED1/ED1-A    Patient was evaluated by ED physician, Terrill Mohr, MD at 1:59 PM    History     Chief Complaint   Patient presents with   . Shortness of Breath       The patient [name], is a 74 y.o. female who presents with chief complaint of increasing dyspnea on exertion and orthopnea since fall in the shower approximately 10 days ago. The patient is unsure why she fell but landed on her buttocks. She had a considerable amount of pain at the time but that has progressively improved. She has had a cough with some right sided cough associated chest pain but no chest pain at other time. No abdominal pain, nausea or vomiting. No urinary symptoms. She has had a dry cough. She denies any URI or flu symptoms and no sore throat or any hemoptysis. She denies any GU or GI bleeding. She is chronically maintained on Coumadin. She does have a pacemaker    PCP:  Rakaric, Valinda Hoar, MD      Past Medical History       Past Medical History   Diagnosis Date   . Atrial fibrillation    . Malignant neoplasm    . Pacemaker    . Hypertension    . Aortic aneurysm    . Chronic obstructive pulmonary disease          Past Surgical History       Past Surgical History   Procedure Laterality Date   . Hysterectomy     . Cardiac pacemaker placement       2002         Family History    Family History   Problem Relation Age of Onset   . Breast cancer Sister        Social History    History     Social History   . Marital Status: Widowed     Spouse Name: N/A   . Number of Children: N/A   . Years of Education: N/A     Social History Main Topics   . Smoking status: Never Smoker    . Smokeless tobacco: Not on file   . Alcohol Use: No   . Drug Use: No   . Sexual Activity: Not on file     Other Topics Concern   . Not on file      Social History Narrative       Allergies    Allergies   Allergen Reactions   . Codeine    . Morphine    . Nitroglycerin    . Pain Patch [Menthol]          Current/Home Medications    Current/Home Medications    ALBUTEROL (PROVENTIL) (2.5 MG/3ML) 0.083% NEBULIZER SOLUTION    Take 2.5 mg by nebulization every 6 (six) hours as needed for Wheezing.    ATENOLOL (TENORMIN) 100 MG TABLET    Take 100 mg by mouth daily.    BENZONATATE (TESSALON) 200 MG CAPSULE    Take 200 mg by mouth 3 (three) times daily as needed.    CALCIUM CARBONATE (OS-CAL) 600 MG TAB TABLET    Take 600 mg by mouth 2 (two) times daily with meals.  CEPHALEXIN 250 MG TABLET    Take 250 mg by mouth 3 (three) times daily.    CLONAZEPAM (KLONOPIN) 0.5 MG TABLET    Take 0.5 mg by mouth 2 (two) times daily as needed.    DILTIAZEM (TIAZAC) 240 MG 24 HR CAPSULE    Take 240 mg by mouth daily.    ESTRADIOL (ESTRACE) 0.1 MG/GM VAGINAL CREAM    Place 2 g vaginally daily.    FUROSEMIDE (LASIX) 40 MG TABLET    Take 1.5 tablets (60 mg total) by mouth daily.    HYDROCODONE-ACETAMINOPHEN (NORCO) 5-325 MG PER TABLET    Take 1 tablet by mouth every 6 (six) hours as needed.    KETOCONAZOLE (NIZORAL) 2 % CREAM    Apply topically daily.    LOSARTAN (COZAAR) 50 MG TABLET        OMEPRAZOLE (PRILOSEC) 20 MG CAPSULE    Take 20 mg by mouth daily.    POTASSIUM CHLORIDE (KLOR-CON) 20 MEQ PACKET    Take 20 mEq by mouth 2 (two) times daily.    RANITIDINE (ZANTAC) 150 MG TABLET    Take 150 mg by mouth 2 (two) times daily.    ROSUVASTATIN (CRESTOR) 10 MG TABLET    Take 10 mg by mouth daily.    SPIRONOLACTONE (ALDACTONE) 50 MG TABLET    Take 50 mg by mouth daily.    WARFARIN (COUMADIN) 3 MG TABLET    Take 1 tablet (3 mg total) by mouth daily.       Vital Signs     BP 135/64 mmHg  Pulse 65  Temp(Src) 96.5 F (35.8 C)  Resp 16  Wt 77 kg  SpO2 96%  Patient Vitals for the past 24 hrs:   BP Temp Pulse Resp SpO2 Weight   08/24/14 1343 135/64 mmHg (!) 96.5 F (35.8 C) 65 16 96 % 77  kg         Review of Systems     GENERAL: no fevers     Head: no headache    Ears:  No ear pain.    Nose:  No congestion.  No coryza    Throat:  No sore throat.  No difficulty swallowing.    Cardiovascular: DOE. orthopnea    GU:  No dysuria.    GI: No nausea or vomiting,  no abdominal pain    Neurological:  No headache. No acute focal motor or sensory complaints     Musculoskeletal:  No pain. No edema. No cyanosis. No symptoms of DVT    Skin:  No rash.  No skin lesions.    All other systems reviewed and negative except as above, pertinent findings in HPI.     Physical Exam     CONSTITUTIONAL  Patient is afebrile, Vital signs reviewed, Well appearing,  Patient appears comfortable, Alert and oriented X 3  HEAD   Atraumatic, Normocephalic.  EYES   Pupils equal, round and reactive to light, Extraocular muscles intact.   NECK   FROM, Supple  RESPIRATORY CHEST   Chest is nontender, Breath sounds normal, No respiratory distress.  CARDIOVASCULAR   RRR, No murmur, gallop, rub or JVD  ABDOMEN   Abdomen is nontender  MUSCULOSKELETAL:   UPPER EXTREMITY   Inspection normal, No cyanosis. No edema. ROM normal; No evidence of VTE  LOWER EXTREMITY   Inspection normal, No cyanosis. Trace pretibial edema. ROM normal. No evidence of VTE  NEURO Normal motor, Normal sensory, CNs intact, Speech normal. Vision normal. Mentation normal  SKIN   Skin is warm, Skin is dry, Skin is normal color. No rashes  PSYCHIATRIC   Normal affect,  Normal insight, Normal concentration.      ED Medication Orders     ED Medication Orders     Start Ordered     Status Ordering Provider    08/24/14 1558 08/24/14 1557  furosemide (LASIX) injection 40 mg   Once in ED     Route: Intravenous  Ordered Dose: 40 mg     Ordered Lachandra Dettmann S          Orders Placed During this Encounter     Orders Placed This Encounter   Procedures   . XR Chest AP Portable   . CBC   . CMP   . Troponin I (Stat)   . PT/INR   . BNP   . Potassium   . ECG 12 lead (Stat)       Diagnostic  Study Results     Labs     Results     Procedure Component Value Units Date/Time    Potassium [[id number]]  (Abnormal) Collected:  08/24/14 1510    Specimen Information:  Plasma Updated:  08/24/14 1536     Potassium 6.4 (HH) mMol/L     BNP [[id number]]  (Abnormal) Collected:  08/24/14 1415    Specimen Information:  Blood Updated:  08/24/14 1450     B-Natriuretic Peptide 409.7 (H) pg/mL     CMP [[id number]]  (Abnormal) Collected:  08/24/14 1415    Specimen Information:  Plasma Updated:  08/24/14 1447     Sodium 138 mMol/L      Potassium 6.4 (HH) mMol/L      Chloride 106 mMol/L      CO2 23.0 mMol/L      CALCIUM 10.1 mg/dL      Glucose 270 (H) mg/dL      Creatinine 3.50 (H) mg/dL      BUN 35 (H) mg/dL      Protein, Total 6.2 gm/dL      Albumin 3.9 gm/dL      Alkaline Phosphatase 127 U/L      ALT 4 U/L      AST (SGOT) 20 U/L      Bilirubin, Total 0.7 mg/dL      Albumin/Globulin Ratio 1.68 (H) Ratio      Anion Gap 15.2 mMol/L      BUN/Creatinine Ratio 23.8 Ratio      EGFR 35 mL/min/1.1m2      Osmolality Calc 284 mOsm/kg      Globulin 2.3 gm/dL     Troponin I (Stat) [[id number]] Collected:  08/24/14 1415    Specimen Information:  Plasma Updated:  08/24/14 1447     Troponin I <0.01 ng/mL     PT/INR [[id number]]  (Abnormal) Collected:  08/24/14 1415    Specimen Information:  Blood Updated:  08/24/14 1436     PT 42.5 (H) sec      PT INR 3.8 (H)     CBC [[id number]]  (Abnormal) Collected:  08/24/14 1415    Specimen Information:  Blood / Blood Updated:  08/24/14 1424     WBC 7.5 K/cmm      RBC 4.26 M/cmm      Hemoglobin 12.9 gm/dL      Hematocrit 25.8 %      MCV 94 fL      MCH 30 pg      MCHC 32 gm/dL      RDW 52.7 %  PLT CT 93 (L) K/cmm      MPV 12.0 (H) fL      NEUTROPHIL % 76.7 %      Lymphocytes 14.4 (L) %      Monocytes 7.9 %      Eosinophils % 0.6 %      Basophils % 0.5 %      Neutrophils Absolute 5.7 K/cmm      Lymphocytes Absolute 1.1 K/cmm      Monocytes Absolute 0.6 K/cmm      Eosinophils Absolute 0.0 K/cmm      BASO  Absolute 0.0 K/cmm           Radiologic Studies  Radiology Results (24 Hour)     Procedure Component Value Units Date/Time    XR Chest AP Portable [[id number]] Collected:  08/24/14 1417    Order Status:  Completed Updated:  08/24/14 1420    Narrative:      Clinical History:  Shortness of Breath    Examination:  Frontal view of the chest.    Comparison:  March 24, 2014    Findings:  Pacemaker appears unchanged in position. There is unchanged cardiomegaly. Aortic calcifications are seen. The aorta  appears dilated or tortuous. No pneumothorax or drainable effusion is seen. There is chronic elevation of the left  hemidiaphragm. No focal consolidation is identified. The regional osseous structures appear intact.      Impression:      1.  There is cardiomegaly with a tortuous or dilated aorta.  2.  Chronic left hemidiaphragmatic elevation.    ReadingStation:[id number]      .    Clinical Course        MDM       Notes:   3:55 PM  on a clinical basis this patient is likely to be in congestive heart failure. She also has hyperkalemia which may be due to an registration spironolactone as well as Cozaar. She takes oral potassium apparently 40 mEq a day as well. She has not had any recent change in any medications. Chronic kidney disease is noted but is stable. I discussed the case with Dr. Marcelyn Bruins and we will admit the patient to the hospital.    Consults:       Data Review     Nursing records reviewed and agree: Yes    Pulse Oximetry Analysis - Normal  Laboratory results reviewed by EDP: No    Radiologic study results reviewed by EDP: No      Rendering Provider: Charissa Bash, MD      Monitors, EKG     Cardiac Monitor (interpreted by ED physician):      EKG paced rhythm, by ED M.D.      Critical Care     Critical care exclusive of time spent performing procedures.    Total time:          Clinical Impression & Disposition     Clinical Impression:  1. Acute on chronic congestive heart failure, unspecified congestive heart  failure type    2. Hyperkalemia        Disposition  ED Disposition     Admit             Prescriptions    New Prescriptions    No medications on file         Signed,  Terrill Mohr, MD  08/24/2014 3:57 PM               Charissa Bash,  MD  08/24/14 1452    Charissa Bash, MD  08/24/14 1453    Charissa Bash, MD  08/24/14 [phone]

## 2014-08-24 NOTE — ED Notes (Signed)
Lab in to draw

## 2014-08-24 NOTE — Plan of Care (Signed)
Problem: Safety  Goal: Patient will be free from injury during hospitalization  Outcome: Progressing

## 2014-08-25 ENCOUNTER — Inpatient Hospital Stay: Payer: Medicare Other

## 2014-08-25 DIAGNOSIS — I5032 Chronic diastolic (congestive) heart failure: Secondary | ICD-10-CM | POA: Diagnosis present

## 2014-08-25 DIAGNOSIS — I5033 Acute on chronic diastolic (congestive) heart failure: Principal | ICD-10-CM

## 2014-08-25 DIAGNOSIS — Z950 Presence of cardiac pacemaker: Secondary | ICD-10-CM

## 2014-08-25 DIAGNOSIS — Z9981 Dependence on supplemental oxygen: Secondary | ICD-10-CM

## 2014-08-25 DIAGNOSIS — I351 Nonrheumatic aortic (valve) insufficiency: Secondary | ICD-10-CM | POA: Diagnosis present

## 2014-08-25 LAB — COMPREHENSIVE METABOLIC PANEL
ALT: 20 U/L (ref 0–55)
AST (SGOT): 21 U/L (ref 10–42)
Albumin/Globulin Ratio: 1.63 Ratio — ABNORMAL HIGH (ref 0.70–1.50)
Albumin: 4 gm/dL (ref 3.5–5.0)
Alkaline Phosphatase: 126 U/L (ref 40–145)
Anion Gap: 15.5 mMol/L (ref 7.0–18.0)
BUN / Creatinine Ratio: 30.1 Ratio — ABNORMAL HIGH (ref 10.0–30.0)
BUN: 31 mg/dL — ABNORMAL HIGH (ref 7–22)
Bilirubin, Total: 0.8 mg/dL (ref 0.1–1.2)
CO2: 25 mMol/L (ref 20.0–30.0)
Calcium: 10.3 mg/dL (ref 8.5–10.5)
Chloride: 100 mMol/L (ref 98–110)
Creatinine: 1.03 mg/dL (ref 0.60–1.20)
EGFR: 52 mL/min/{1.73_m2}
Globulin: 2.5 gm/dL (ref 2.0–4.0)
Glucose: 84 mg/dL (ref 70–99)
Osmolality Calc: 279 mOsm/kg (ref 275–300)
Potassium: 4.2 mMol/L (ref 3.5–5.3)
Protein, Total: 6.5 gm/dL (ref 6.0–8.3)
Sodium: 137 mMol/L (ref 136–147)

## 2014-08-25 LAB — CBC AND DIFFERENTIAL
Basophils %: 0.7 % (ref 0.0–3.0)
Basophils Absolute: 0 10*3/uL (ref 0.0–0.3)
Eosinophils %: 2.6 % (ref 0.0–7.0)
Eosinophils Absolute: 0.1 10*3/uL (ref 0.0–0.8)
Hematocrit: 42.6 % (ref 36.0–48.0)
Hemoglobin: 14.1 gm/dL (ref 12.0–16.0)
Lymphocytes Absolute: 1.3 10*3/uL (ref 0.6–5.1)
Lymphocytes: 22.2 % (ref 15.0–46.0)
MCH: 31 pg (ref 28–35)
MCHC: 33 gm/dL (ref 31–36)
MCV: 92 fL (ref 80–100)
MPV: 9.9 fL (ref 6.0–10.0)
Monocytes Absolute: 0.5 10*3/uL (ref 0.1–1.7)
Monocytes: 9.6 % (ref 3.0–15.0)
Neutrophils %: 64.9 % (ref 42.0–78.0)
Neutrophils Absolute: 3.7 10*3/uL (ref 1.7–8.6)
PLT CT: 84 10*3/uL — ABNORMAL LOW (ref 130–440)
RBC: 4.63 10*6/uL (ref 3.80–5.00)
RDW: 12.3 % (ref 10.5–14.5)
WBC: 5.7 10*3/uL (ref 4.00–11.00)

## 2014-08-25 LAB — PT/INR
PT INR: 2.8 — ABNORMAL HIGH (ref 0.8–1.2)
PT: 31.4 s — ABNORMAL HIGH (ref 9.4–12.5)

## 2014-08-25 MED ORDER — SPIRONOLACTONE 25 MG PO TABS
12.5000 mg | ORAL_TABLET | Freq: Every day | ORAL | Status: DC
Start: 2014-08-25 — End: 2014-08-26
  Administered 2014-08-25 – 2014-08-26 (×2): 12.5 mg via ORAL
  Filled 2014-08-25 (×2): qty 1

## 2014-08-25 NOTE — Progress Notes (Signed)
ROUNDING NOTE    Date Time: 08/25/2014 10:46 AM  Patient Name: [name],[name]  Attending Physician: Lesleigh Noe, MD  Primary Care Physician: Ladona Horns, MD    Primary Problem: Acute on chronic diastolic CHF (congestive heart failure)     She is feeling much better today and is breathing easier. She currently reports no chest pain or chest tightness. She does remain dyspneic with even moderate exertion. Her potassium level has improved significantly and she reports that her peripheral edema has also improved. She reports no abdominal pain, nausea or vomiting and states her last bowel movement occurred last night. She reports no urinary symptoms of frequency, urgency or burning.    Allergies:     Allergies   Allergen Reactions   . Codeine    . Morphine    . Nitroglycerin    . Pain Patch [Menthol]        Medications:     Current Facility-Administered Medications   Medication Dose Route Frequency   . albuterol  2.5 mg Nebulization Q6H SCH   . atenolol  100 mg Oral Daily   . diltiazem  240 mg Oral Daily   . famotidine  20 mg Oral BID   . furosemide  40 mg Intravenous BID   . losartan  50 mg Oral Daily   . rosuvastatin  10 mg Oral QHS   . sodium chloride (PF)  3 mL Intravenous Q8H   . spironolactone  12.5 mg Oral Daily   . warfarin  2.5 mg Oral Daily at 1800    Followed by   . [START ON 08/26/2014] warfarin  1-5 mg Oral Daily at 1800    Followed by   . [START ON 08/27/2014] warfarin  2.5-5 mg Oral Daily at 1800    Followed by   . [START ON 08/28/2014] warfarin  2.5-5 mg Oral Daily at 1800    Followed by   . [START ON 08/29/2014] warfarin  1-5 mg Oral Daily at 1800     acetaminophen **OR** acetaminophen **OR** acetaminophen, benzonatate, clonazePAM, HYDROcodone-acetaminophen       Review of Systems:   All other systems were reviewed and are negative except: Continued dyspnea on exertion    Physical Exam:   Patient Vitals for the past 24 hrs:   BP Temp Temp src Pulse Resp SpO2 Height Weight    08/25/14 0854 - - - - - 98 % - -   08/25/14 0727 152/79 mmHg 98.2 F (36.8 C) Oral 75 18 98 % - -   08/24/14 2347 123/66 mmHg 98.6 F (37 C) Oral 61 18 97 % - -   08/24/14 2005 - - - - - 98 % - -   08/24/14 1838 - - - 70 20 - - -   08/24/14 1826 152/70 mmHg 98.1 F (36.7 C) Oral (!) 57 21 93 % 1.626 m (5\' 4" ) 76.658 kg (169 lb)   08/24/14 1651 141/70 mmHg - - 60 21 98 % - -   08/24/14 1607 132/70 mmHg - - 69 - 98 % - -   08/24/14 1343 135/64 mmHg (!) 96.5 F (35.8 C) - 65 16 96 % - 77 kg (169 lb 12.1 oz)     Body mass index is 28.99 kg/(m^2).  No intake or output data in the 24 hours ending 08/25/14 1046    General: awake, alert, oriented x 3; no acute distress. Dyspneic with prolonged speech  HEENT: perrla, eomi, sclera anicteric  oropharynx clear without  lesions, mucous membranes moist  Neck: supple, no lymphadenopathy, no thyromegaly, 4 cm JVD at 45, no carotid bruits  Cardiovascular: regular rate and rhythm, grade 1 to 2/6 diastolic murmur best heard right upper sternal border what appears to be a component of the systolic murmur best heard left lower sternal border without radiation and no rubs or gallops  Lungs: Bibasilar rales are noted at the lung bases  Abdomen: soft, non-tender, non-distended; no palpable masses, no hepatosplenomegaly, normoactive bowel sounds, no rebound or guarding  Extremities: no clubbing, cyanosis, with 1-2+ pretibial edema with chronic venous stasis changes  Neuro: cranial nerves grossly intact, strength 5/5 in upper and lower extremities, sensation intact,   Skin: no rashes or lesions noted      Labs:     Results     Procedure Component Value Units Date/Time    Comprehensive metabolic panel [[id number]]  (Abnormal) Collected:  08/25/14 0615    Specimen Information:  Plasma Updated:  08/25/14 0656     Sodium 137 mMol/L      Potassium 4.2 mMol/L      Chloride 100 mMol/L      CO2 25.0 mMol/L      CALCIUM 10.3 mg/dL      Glucose 84 mg/dL      Creatinine 7.56 mg/dL      BUN 31 (H)  mg/dL      Protein, Total 6.5 gm/dL      Albumin 4.0 gm/dL      Alkaline Phosphatase 126 U/L      ALT 20 U/L      AST (SGOT) 21 U/L      Bilirubin, Total 0.8 mg/dL      Albumin/Globulin Ratio 1.63 (H) Ratio      Anion Gap 15.5 mMol/L      BUN/Creatinine Ratio 30.1 (H) Ratio      EGFR 52 mL/min/1.54m2      Osmolality Calc 279 mOsm/kg      Globulin 2.5 gm/dL     Daily PT/INR [[id number]]  (Abnormal) Collected:  08/25/14 0615    Specimen Information:  Blood Updated:  08/25/14 0648     PT 31.4 (H) sec      PT INR 2.8 (H)     CBC and differential [[id number]]  (Abnormal) Collected:  08/25/14 0615    Specimen Information:  Blood / Blood Updated:  08/25/14 0641     WBC 5.7 K/cmm      RBC 4.63 M/cmm      Hemoglobin 14.1 gm/dL      Hematocrit 60.1 %      MCV 92 fL      MCH 31 pg      MCHC 33 gm/dL      RDW 09.3 %      PLT CT 84 (L) K/cmm      MPV 9.9 fL      NEUTROPHIL % 64.9 %      Lymphocytes 22.2 %      Monocytes 9.6 %      Eosinophils % 2.6 %      Basophils % 0.7 %      Neutrophils Absolute 3.7 K/cmm      Lymphocytes Absolute 1.3 K/cmm      Monocytes Absolute 0.5 K/cmm      Eosinophils Absolute 0.1 K/cmm      BASO Absolute 0.0 K/cmm     Potassium [[id number]] Collected:  08/24/14 2305    Specimen Information:  Plasma Updated:  08/24/14 2326  Potassium 4.4 mMol/L     Potassium [[id number]]  (Abnormal) Collected:  08/24/14 1510    Specimen Information:  Plasma Updated:  08/24/14 1536     Potassium 6.4 (HH) mMol/L     BNP [[id number]]  (Abnormal) Collected:  08/24/14 1415    Specimen Information:  Blood Updated:  08/24/14 1450     B-Natriuretic Peptide 409.7 (H) pg/mL     CMP [[id number]]  (Abnormal) Collected:  08/24/14 1415    Specimen Information:  Plasma Updated:  08/24/14 1447     Sodium 138 mMol/L      Potassium 6.4 (HH) mMol/L      Chloride 106 mMol/L      CO2 23.0 mMol/L      CALCIUM 10.1 mg/dL      Glucose 283 (H) mg/dL      Creatinine 1.51 (H) mg/dL      BUN 35 (H) mg/dL      Protein, Total 6.2 gm/dL      Albumin 3.9  gm/dL      Alkaline Phosphatase 127 U/L      ALT 4 U/L      AST (SGOT) 20 U/L      Bilirubin, Total 0.7 mg/dL      Albumin/Globulin Ratio 1.68 (H) Ratio      Anion Gap 15.2 mMol/L      BUN/Creatinine Ratio 23.8 Ratio      EGFR 35 mL/min/1.81m2      Osmolality Calc 284 mOsm/kg      Globulin 2.3 gm/dL     Troponin I (Stat) [[id number]] Collected:  08/24/14 1415    Specimen Information:  Plasma Updated:  08/24/14 1447     Troponin I <0.01 ng/mL     PT/INR [[id number]]  (Abnormal) Collected:  08/24/14 1415    Specimen Information:  Blood Updated:  08/24/14 1436     PT 42.5 (H) sec      PT INR 3.8 (H)     CBC [[id number]]  (Abnormal) Collected:  08/24/14 1415    Specimen Information:  Blood / Blood Updated:  08/24/14 1424     WBC 7.5 K/cmm      RBC 4.26 M/cmm      Hemoglobin 12.9 gm/dL      Hematocrit 38.1 %      MCV 94 fL      MCH 30 pg      MCHC 32 gm/dL      RDW 82.9 %      PLT CT 93 (L) K/cmm      MPV 12.0 (H) fL      NEUTROPHIL % 76.7 %      Lymphocytes 14.4 (L) %      Monocytes 7.9 %      Eosinophils % 0.6 %      Basophils % 0.5 %      Neutrophils Absolute 5.7 K/cmm      Lymphocytes Absolute 1.1 K/cmm      Monocytes Absolute 0.6 K/cmm      Eosinophils Absolute 0.0 K/cmm      BASO Absolute 0.0 K/cmm             Radiology:     Radiology Results (24 Hour)     Procedure Component Value Units Date/Time    XR Chest AP Portable [[id number]] Collected:  08/24/14 1417    Order Status:  Completed Updated:  08/24/14 1420    Narrative:      Clinical History:  Shortness of Breath  Examination:  Frontal view of the chest.    Comparison:  March 24, 2014    Findings:  Pacemaker appears unchanged in position. There is unchanged cardiomegaly. Aortic calcifications are seen. The aorta  appears dilated or tortuous. No pneumothorax or drainable effusion is seen. There is chronic elevation of the left  hemidiaphragm. No focal consolidation is identified. The regional osseous structures appear intact.      Impression:      1.  There is  cardiomegaly with a tortuous or dilated aorta.  2.  Chronic left hemidiaphragmatic elevation.    ReadingStation:[id number]            Assessment:     Active Hospital Problems    Diagnosis   . Acute on chronic diastolic CHF (congestive heart failure)   . On home oxygen therapy   . Aortic regurgitation   . Pacemaker   . Hyperkalemia   . Paroxysmal atrial fibrillation   . CHF (congestive heart failure)           Plan:   At this point time she appears to be clinically improved and will continue on additional doses of IV Lasix. We'll try to resume a lower dose of Aldactone and of course stay off the supplemental potassium. A 2 view chest x-ray will be requested as she does remain dyspneic. She does follow regularly with [name] cardiology but may need to move up her appointment which is tentatively scheduled in the next 3 months. Clinically she appears to be in worsening heart failure and it is not clear why and may need to consider echocardiogram. Her EKG shows her to be stable in a paced rhythm but will place on telemetry to make sure that she is not having issues with paroxysmal atrial fibrillation. Further interventions as clinically indicated.      Signed by: Lemar Lofty, MD

## 2014-08-25 NOTE — Progress Notes (Signed)
74 year old female admitted on 08/24/2014 with a diagnosis of Congestive Heart Failure.  Met with patient this afternoon to discuss discharge planning.  Patient states that she lives at home and that her daughter and son in law live with her.  Patient states that prior to this hospitalization she remained fairly independent.  Patient states that she is able to perform her own shower/personal care, cook simple meals, and continues to drive to church on Sundays.  Patient states that her daughter mostly does the cooking, household chores, yard work, and [employer].  Patient states that she uses a cane to assist with ambulation needs.  However, patient states that she also has a shower chair, grab bars in the bathroom, a walker, and a Rolator at home that can be used if needed.  Patient states that she wears home oxygen at 2 liters and receives her oxygen needs from [facility].  Patient denies any concerns with their services.  Patient states that at time of discharge her neighbor or daughter will provide transportation home via private vehicle.  We have discussed home health services and patient denies wanting/needing these services.   Patient states that she had planned a trip to [place] to this Friday to visit her daughter.   Therefore, we have discussed transition coach services related to patients CHF diagnosis and patient is agreeable to one or two telephone calls.  A referral has been made to Estelle June, RN Transition Coach at this time.  We have also discussed patient prescription coverage and patient denies any coverage concerns.  Patient states "I get my medicines at [facility] and my insurance pays everything".  At present, patient voices no financial concerns, no prescription coverage concerns, or any discharge needs/services.  However, case management will continue to monitor for interventions that may arise until discharge time.

## 2014-08-25 NOTE — Plan of Care (Signed)
Problem: Pain  Goal: Patient's pain/discomfort is manageable  Outcome: Progressing

## 2014-08-25 NOTE — Progress Notes (Signed)
Met with patient and in agreement with Transition Coach Program.  Will f/u after d/c.

## 2014-08-25 NOTE — Plan of Care (Signed)
Problem: Safety  Goal: Patient will be free from injury during hospitalization  Outcome: Progressing

## 2014-08-25 NOTE — UM Notes (Signed)
[facility] Utilization Management Review Sheet    NAME: [name]  MR#: [id number]    CSN#: [id number]    ROOM: 203/203-A AGE: 74 y.o.    START OF SERVICE DATE AND TIME: 08/24/2014  1:41 PM  ADMIT DATE/TIME:  08/24/14 1655    PATIENT CLASS: INPATIENT    ATTENDING PHYSICIAN: Desrochers, Duwayne Heck, MD  PAYOR:Payor: MEDICARE / Plan: MEDICARE PART A AND B / Product Type: *No Product type* /       AUTH #: NPR-MEDICARE    DIAGNOSIS:     ICD-10-CM    1. Acute on chronic congestive heart failure, unspecified congestive heart failure type I50.9    2. Hyperkalemia E87.5        HISTORY:   Past Medical History   Diagnosis Date   . Atrial fibrillation    . Malignant neoplasm    . Pacemaker    . Hypertension    . Aortic aneurysm    . Chronic obstructive pulmonary disease        DATE OF REVIEW: 08/25/2014    VITALS: BP 152/79 mmHg  Pulse 75  Temp(Src) 98.2 F (36.8 C) (Oral)  Resp 18  Ht 1.626 m (5\' 4" )  Wt 76.658 kg (169 lb)  BMI 28.99 kg/m2  SpO2 98%    To the emergency department with chief complaint of increasing dyspnea on exertion and orthopnea since fall in the shower approximately 10 days ago. The patient is unsure why she fell but landed on her buttocks. She had a considerable amount of pain at the time but that has progressively improved. She has had a cough with some right sided cough associated chest pain but no chest pain at other time.    ED Vital Signs:  BP 135/64 mmHg  Pulse 65  Temp(Src) 96.5 F (35.8 C)  Resp 16  Wt 77 kg  SpO2 96%    ED Treatment:  Lasix 40 mg Iv    ED Diagnosis:  Potassium 6.4, BNP 409, Na 138, Glucose 110, Bun 35, CR 1.47, Troponin 0.01, INR 3.8, WBC 7.5, H/H 12.9/40.0, Chest X-ray:  1. There is cardiomegaly with a tortuous or dilated aorta.  2. Chronic left hemidiaphragmatic elevation, EKG:  Paced Rhythm    Orders:  Inpatient, Vital Signs every 6 hours, up with assistance/up as tolerated, telemetry monitoring, strict intake./output, oxygen at 3 liters- titrate to maintain  SPO2 90-96%, daily weights, No CPR, daily INR, Check CBC/CMP daily, Coumadin daily po, Lasix 40 mg twice a day Iv, Kayexalate 15 gm x 2 days po, Repeat potassium level at 2200    Assessment and Plan:     Active Hospital Problems    Diagnosis   . Hyperkalemia   . CHF (congestive heart failure)       [name] is a 74 yo female who presents with exacerbation of CHF as well as hyperkalemia. Given her significant symptoms and significantly elevated serum K, it is prudent to admit her for inpatient treatment and monitoring. It is likely that her hyperkalemia is a result of concurrent potassium sparing diuretics and potassium supplementation in the setting of CKD. While inpatient we will hold her spironolactone and potassium supplementation. She will be placed on 40mg  of IV lasix BID for both her CHF exacerbation and for her hyperkalemia. We will also give two doses of kayexalate as given her poor renal function she may not clear the potassium quickly with isolated lasix therapy. Will recheck her potassium around 10PM. Will recheck CBC and CMP  in the morning. Will perform close I&O as well as daily weights to monitor effects of lasix.    [facility] Continued Stay Review Note for 08/25/2014:      Primary Problem: Acute on chronic diastolic CHF (congestive heart failure)     She is feeling much better today and is breathing easier. She currently reports no chest pain or chest tightness. She does remain dyspneic with even moderate exertion. Her potassium level has improved significantly and she reports that her peripheral edema has also improved. She reports no abdominal pain, nausea or vomiting and states her last bowel movement occurred last night. She reports no urinary symptoms of frequency, urgency or burning.  Lungs noted with Bibasilar rales are noted at the lung bases.    1-2+ pretibial edema with chronic venous stasis changes noted to lower extremities.    Vital Signs:  Blood pressure 152/79,  temperature 98.2, pulse 75, respirations 18, SPO2 98%    Diagnostics:  Na 137, K+ 4.2, Glucose 84, Bun 31, Cr 1.03, INR 2.8    Assessment:     Active Hospital Problems    Diagnosis   . Acute on chronic diastolic CHF (congestive heart failure)   . On home oxygen therapy   . Aortic regurgitation   . Pacemaker   . Hyperkalemia   . Paroxysmal atrial fibrillation   . CHF (congestive heart failure)           Plan:   At this point time she appears to be clinically improved and will continue on additional doses of IV Lasix. We'll try to resume a lower dose of Aldactone and of course stay off the supplemental potassium. A 2 view chest x-ray will be requested as she does remain dyspneic. She does follow regularly with [facility] cardiology but may need to move up her appointment which is tentatively scheduled in the next 3 months. Clinically she appears to be in worsening heart failure and it is not clear why and may need to consider echocardiogram. Her EKG shows her to be stable in a paced rhythm but will place on telemetry to make sure that she is not having issues with paroxysmal atrial fibrillation. Further interventions as clinically indicated.    Orders:  Lasix 40 mg every 12 hours Iv, Aldactone 12.5 mg daily po, Check BNP, BMP, CBC in am, Chest X-ray    Mcg 19 th Edition:  M-190 for potassium level of 6.4, increasing dyspnea on exertion and orthopnea, BNP at 409, lungs noted with Bibasilar rales are noted at the lung bases and 1-2+ pretibial edema with chronic venous stasis changes noted to lower extremities, patient receiving Iv Lasix 40 mg twice a day Iv and is noted to remain with dyspnea and worsening heart failure today, 08/25/14 per physician note.

## 2014-08-26 LAB — COMPREHENSIVE METABOLIC PANEL
ALT: 24 U/L (ref 0–55)
AST (SGOT): 20 U/L (ref 10–42)
Albumin/Globulin Ratio: 1.7 Ratio — ABNORMAL HIGH (ref 0.70–1.50)
Albumin: 3.7 gm/dL (ref 3.5–5.0)
Alkaline Phosphatase: 113 U/L (ref 40–145)
Anion Gap: 15.3 mMol/L (ref 7.0–18.0)
BUN / Creatinine Ratio: 34.3 Ratio — ABNORMAL HIGH (ref 10.0–30.0)
BUN: 37 mg/dL — ABNORMAL HIGH (ref 7–22)
Bilirubin, Total: 0.7 mg/dL (ref 0.1–1.2)
CO2: 28 mMol/L (ref 20.0–30.0)
Calcium: 10 mg/dL (ref 8.5–10.5)
Chloride: 98 mMol/L (ref 98–110)
Creatinine: 1.08 mg/dL (ref 0.60–1.20)
EGFR: 49 mL/min/{1.73_m2}
Globulin: 2.2 gm/dL (ref 2.0–4.0)
Glucose: 89 mg/dL (ref 70–99)
Osmolality Calc: 284 mOsm/kg (ref 275–300)
Potassium: 3.8 mMol/L (ref 3.5–5.3)
Protein, Total: 5.9 gm/dL — ABNORMAL LOW (ref 6.0–8.3)
Sodium: 138 mMol/L (ref 136–147)

## 2014-08-26 LAB — CBC AND DIFFERENTIAL
Basophils %: 0.6 % (ref 0.0–3.0)
Basophils Absolute: 0 10*3/uL (ref 0.0–0.3)
Eosinophils %: 2.3 % (ref 0.0–7.0)
Eosinophils Absolute: 0.1 10*3/uL (ref 0.0–0.8)
Hematocrit: 40.8 % (ref 36.0–48.0)
Hemoglobin: 13.6 gm/dL (ref 12.0–16.0)
Lymphocytes Absolute: 1.3 10*3/uL (ref 0.6–5.1)
Lymphocytes: 23.4 % (ref 15.0–46.0)
MCH: 31 pg (ref 28–35)
MCHC: 33 gm/dL (ref 31–36)
MCV: 92 fL (ref 80–100)
MPV: 9.5 fL (ref 6.0–10.0)
Monocytes Absolute: 0.8 10*3/uL (ref 0.1–1.7)
Monocytes: 13.2 % (ref 3.0–15.0)
Neutrophils %: 60.6 % (ref 42.0–78.0)
Neutrophils Absolute: 3.5 10*3/uL (ref 1.7–8.6)
PLT CT: 88 10*3/uL — ABNORMAL LOW (ref 130–440)
RBC: 4.46 10*6/uL (ref 3.80–5.00)
RDW: 12.4 % (ref 10.5–14.5)
WBC: 5.7 10*3/uL (ref 4.00–11.00)

## 2014-08-26 LAB — PT/INR
PT INR: 2.1 — ABNORMAL HIGH (ref 0.8–1.2)
PT: 23.6 s — ABNORMAL HIGH (ref 9.4–12.5)

## 2014-08-26 LAB — B-TYPE NATRIURETIC PEPTIDE: B-Natriuretic Peptide: 311.9 pg/mL — ABNORMAL HIGH (ref 0.0–100.0)

## 2014-08-26 MED ORDER — FUROSEMIDE 40 MG PO TABS
40.0000 mg | ORAL_TABLET | Freq: Two times a day (BID) | ORAL | Status: DC
Start: 2014-08-26 — End: 2014-08-27
  Administered 2014-08-26 – 2014-08-27 (×3): 40 mg via ORAL
  Filled 2014-08-26 (×3): qty 1

## 2014-08-26 MED ORDER — CLONAZEPAM 0.5 MG PO TABS
0.5000 mg | ORAL_TABLET | Freq: Every evening | ORAL | Status: DC
Start: 2014-08-26 — End: 2014-08-27
  Administered 2014-08-26: 0.5 mg via ORAL
  Filled 2014-08-26: qty 1

## 2014-08-26 MED ORDER — BISACODYL 10 MG RE SUPP
10.0000 mg | Freq: Every day | RECTAL | Status: DC
Start: 2014-08-26 — End: 2014-08-27
  Administered 2014-08-26 (×2): 10 mg via RECTAL
  Filled 2014-08-26: qty 1

## 2014-08-26 MED ORDER — LATANOPROST 0.005 % OP SOLN
1.0000 [drp] | Freq: Every evening | OPHTHALMIC | Status: DC
Start: 2014-08-26 — End: 2014-08-27
  Administered 2014-08-26: 1 [drp] via OPHTHALMIC
  Filled 2014-08-26: qty 1

## 2014-08-26 MED ORDER — SPIRONOLACTONE 25 MG PO TABS
25.0000 mg | ORAL_TABLET | Freq: Every day | ORAL | Status: DC
Start: 2014-08-27 — End: 2014-08-27
  Administered 2014-08-27: 25 mg via ORAL
  Filled 2014-08-26: qty 1

## 2014-08-26 MED ORDER — MAGNESIUM HYDROXIDE 400 MG/5ML PO SUSP
30.0000 mL | Freq: Every day | ORAL | Status: DC
Start: 2014-08-26 — End: 2014-08-27
  Administered 2014-08-26: 30 mL via ORAL
  Filled 2014-08-26 (×2): qty 30

## 2014-08-26 NOTE — Plan of Care (Signed)
Problem: Safety  Goal: Patient will be free from injury during hospitalization  Pt remains free of injury

## 2014-08-26 NOTE — Progress Notes (Signed)
Albuterol (UD) given.

## 2014-08-26 NOTE — Progress Notes (Signed)
ROUNDING NOTE    Date Time: 08/26/2014 10:51 AM  Patient Name: ZOXWRUEA,VWUJWJX [name]  Attending Physician: Lesleigh Noe, MD  Primary Care Physician: Ladona Horns, MD    Primary Problem: Acute on chronic diastolic CHF (congestive heart failure)     She continues to feel better each day. Her breathing is significantly improved. Unfortunately she has developed some constipation issues and would like treatment for that. She otherwise reports that her cough is largely abated. She denies chest pain or chest tightness.she reports no abdominal pain, nausea or vomiting. She is worried about the constipation triggering however abdominal symptomatology.    Allergies:     Allergies   Allergen Reactions   . Codeine    . Morphine    . Nitroglycerin    . Pain Patch [Menthol]        Medications:     Current Facility-Administered Medications   Medication Dose Route Frequency   . albuterol  2.5 mg Nebulization Q6H SCH   . atenolol  100 mg Oral Daily   . clonazePAM  0.5 mg Oral QHS   . diltiazem  240 mg Oral Daily   . famotidine  20 mg Oral BID   . furosemide  40 mg Intravenous BID   . latanoprost  1 drop Both Eyes QHS   . rosuvastatin  10 mg Oral QHS   . sodium chloride (PF)  3 mL Intravenous Q8H   . spironolactone  12.5 mg Oral Daily   . warfarin  2.5 mg Oral Daily at 1800    Followed by   . warfarin  1-5 mg Oral Daily at 1800    Followed by   . [START ON 08/27/2014] warfarin  2.5-5 mg Oral Daily at 1800    Followed by   . [START ON 08/28/2014] warfarin  2.5-5 mg Oral Daily at 1800    Followed by   . [START ON 08/29/2014] warfarin  1-5 mg Oral Daily at 1800     acetaminophen **OR** acetaminophen **OR** acetaminophen, benzonatate, HYDROcodone-acetaminophen       Review of Systems:   All other systems were reviewed and are negative except: mild weakness    Physical Exam:   Patient Vitals for the past 24 hrs:   BP Temp Temp src Pulse Resp SpO2 Weight   08/26/14 0928 - - - - - - 73.936 kg (163 lb)   08/26/14 0855 - - - 68  18 98 % -   08/26/14 0632 130/62 mmHg 97.5 F (36.4 C) Oral 66 17 99 % -   08/26/14 0144 - - - - - 96 % -   08/26/14 0143 - - - 62 16 - -   08/25/14 2218 115/57 mmHg 97.6 F (36.4 C) Oral 66 16 96 % -   08/25/14 1930 - - - 64 18 98 % -   08/25/14 1823 120/62 mmHg 98.1 F (36.7 C) Oral 62 18 98 % -   08/25/14 1300 133/74 mmHg 98.2 F (36.8 C) Oral 64 18 98 % -     Body mass index is 27.97 kg/(m^2).    Intake/Output Summary (Last 24 hours) at 08/26/14 1051  Last data filed at 08/26/14 1012   Gross per 24 hour   Intake    240 ml   Output      0 ml   Net    240 ml       General: awake, alert, oriented x 3; no acute distress.appears comfortable lying in bed  HEENT: perrla, eomi, sclera anicteric  oropharynx clear without lesions, mucous membranes moist  Neck: supple, no lymphadenopathy, no thyromegaly, no JVD  Cardiovascular: regular rate and rhythm, no  rubs or gallops  Lungs: clear to auscultation anteriorly, without wheezing, rhonchi, his only a few bibasilar ralesat the bases  Abdomen: soft, non-tender, non-distended; no palpable masses, no hepatosplenomegaly, normoactive bowel sounds, no rebound or guarding  Extremities: no clubbing, cyanosis with only trivial pretibial and pedal edema  Neuro: cranial nerves grossly intact, strength 4/5 in upper and lower extremities  Skin: no rashes or lesions noted      Labs:     Results     Procedure Component Value Units Date/Time    Comprehensive metabolic panel [[id number]]  (Abnormal) Collected:  08/26/14 0540    Specimen Information:  Plasma Updated:  08/26/14 0624     Sodium 138 mMol/L      Potassium 3.8 mMol/L      Chloride 98 mMol/L      CO2 28.0 mMol/L      CALCIUM 10.0 mg/dL      Glucose 89 mg/dL      Creatinine 1.91 mg/dL      BUN 37 (H) mg/dL      Protein, Total 5.9 (L) gm/dL      Albumin 3.7 gm/dL      Alkaline Phosphatase 113 U/L      ALT 24 U/L      AST (SGOT) 20 U/L      Bilirubin, Total 0.7 mg/dL      Albumin/Globulin Ratio 1.70 (H) Ratio      Anion Gap 15.3  mMol/L      BUN/Creatinine Ratio 34.3 (H) Ratio      EGFR 49 mL/min/1.27m2      Osmolality Calc 284 mOsm/kg      Globulin 2.2 gm/dL     B-type Natriuretic Peptide [[id number]]  (Abnormal) Collected:  08/26/14 0540    Specimen Information:  Blood Updated:  08/26/14 0622     B-Natriuretic Peptide 311.9 (H) pg/mL     Daily PT/INR [[id number]]  (Abnormal) Collected:  08/26/14 0540    Specimen Information:  Blood Updated:  08/26/14 0613     PT 23.6 (H) sec      PT INR 2.1 (H)     CBC and differential [[id number]]  (Abnormal) Collected:  08/26/14 0540    Specimen Information:  Blood / Blood Updated:  08/26/14 0609     WBC 5.7 K/cmm      RBC 4.46 M/cmm      Hemoglobin 13.6 gm/dL      Hematocrit 32.4 %      MCV 92 fL      MCH 31 pg      MCHC 33 gm/dL      RDW 40.1 %      PLT CT 88 (L) K/cmm      MPV 9.5 fL      NEUTROPHIL % 60.6 %      Lymphocytes 23.4 %      Monocytes 13.2 %      Eosinophils % 2.3 %      Basophils % 0.6 %      Neutrophils Absolute 3.5 K/cmm      Lymphocytes Absolute 1.3 K/cmm      Monocytes Absolute 0.8 K/cmm      Eosinophils Absolute 0.1 K/cmm      BASO Absolute 0.0 K/cmm             Radiology:  Radiology Results (24 Hour)     Procedure Component Value Units Date/Time    XR CHEST 2 VIEWS [[id number]] Collected:  08/25/14 1126    Order Status:  Completed Updated:  08/25/14 1129    Narrative:      Clinical History:  PT STATES SEVERE SOB SINCE YESTERDAY. ON O2 FT AT HOME BUT STILL SOB. STATES BREATHING EASIER TODAY BUT WORSENS WHEN SHE  EATS OR GETS A SHOWER.     Examination:  Frontal and lateral views of the chest.    Comparison:  08/24/2014    Findings:  Moderate to marked cardiomegaly with left subclavian pacer in place. Chronic elevation left hemidiaphragm with no focal  infiltrate pleural effusion or pneumothorax. 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      Impression:      Moderate to marked cardiomegaly with aneurysmal dilatation of thoracic aorta unchanged from 08/24/2014. No new  focal  infiltrate, pleural effusion, or pneumothorax. Chronic elevation left hemidiaphragm.    ReadingStation:[id number]            Assessment:     Active Hospital Problems    Diagnosis   . Acute on chronic diastolic CHF (congestive heart failure)   . On home oxygen therapy   . Aortic regurgitation   . Pacemaker   . Hyperkalemia   . Paroxysmal atrial fibrillation   . CHF (congestive heart failure)           Plan:   At this point she appears to be stable from a cardiac standpoint we'll go ahead and try to transition her off IV diuretics. She appears to be tolerating the resumption of low-dose Aldactone and will titrate that dosage upward. Of course we'll leave her off supplemental potassium at the present time. We'll go ahead and add agents for constipation. Thus far by cardiac monitor she has not exhibited significant episodes of atrial fibrillation so I don't think that this was a contributing factor. She does remain stable on anticoagulation. Hopefully we'll be able to discharge home tomorrow.      Signed by: Lemar Lofty, MD

## 2014-08-26 NOTE — Plan of Care (Signed)
Problem: Pain  Goal: Patient's pain/discomfort is manageable  Outcome: Progressing

## 2014-08-26 NOTE — Plan of Care (Signed)
Problem: Safety  Goal: Patient will be free from injury during hospitalization  Outcome: Progressing

## 2014-08-26 NOTE — UM Notes (Signed)
UR NOTE 08/26/14:     Patient feeling some better each day. Breathing much improved over admission. Lung sounds with few bibasilar rales at the bases.   Plan:   At this point she appears to be stable from a cardiac standpoint we'll go ahead and try to transition her off IV diuretics. She appears to be tolerating the resumption of low-dose Aldactone and will titrate that dosage upward. Of course we'll leave her off supplemental potassium at the present time. We'll go ahead and add agents for constipation. Thus far by cardiac monitor she has not exhibited significant episodes of atrial fibrillation so I don't think that this was a contributing factor. She does remain stable on anticoagulation. Hopefully we'll be able to discharge home tomorrow.    VITALS: T 97.5, HR 68, R 18, BP 130/62, SATS 96%    DIAGNOSTICS: WBC 5.7, H/H 13.6/40.8; PT/INR 23.6/2.1; BNP 311.9; BUN 37, B/C RATIO 34.3    ORDERS: INPATIENT, NO CPR, ALBUTEROL NEBS EVERY 6 HOURS SCHEDULED, LASIX 40MG  PO BID, ALDACTONE 25MG  DAILY, IV LASIX 40MG  BID, COUMADIN THERAPY WITH DAILY INR, CBC/BMP IN AM, OXYGEN PRN, DAILY WEIGHTS, VS QID, TELEMETRY    [facility] 19TH EDITION M-190 HEART FAILURE- IMPROVING BUT REQUIRING ADJUSTMENTS IN MEDICATIONS, CONTINUING TO RECEIVE IV LASIX WITH INTRODUCTION OF PO.

## 2014-08-27 DIAGNOSIS — Z7901 Long term (current) use of anticoagulants: Secondary | ICD-10-CM

## 2014-08-27 DIAGNOSIS — Z950 Presence of cardiac pacemaker: Secondary | ICD-10-CM

## 2014-08-27 DIAGNOSIS — E875 Hyperkalemia: Secondary | ICD-10-CM

## 2014-08-27 DIAGNOSIS — I4891 Unspecified atrial fibrillation: Secondary | ICD-10-CM

## 2014-08-27 LAB — CBC AND DIFFERENTIAL
Basophils %: 0.7 % (ref 0.0–3.0)
Basophils Absolute: 0 10*3/uL (ref 0.0–0.3)
Eosinophils %: 0.1 % (ref 0.0–7.0)
Eosinophils Absolute: 0 10*3/uL (ref 0.0–0.8)
Hematocrit: 43.1 % (ref 36.0–48.0)
Hemoglobin: 14.3 gm/dL (ref 12.0–16.0)
Lymphocytes Absolute: 1.5 10*3/uL (ref 0.6–5.1)
Lymphocytes: 24 % (ref 15.0–46.0)
MCH: 30 pg (ref 28–35)
MCHC: 33 gm/dL (ref 31–36)
MCV: 92 fL (ref 80–100)
MPV: 9 fL (ref 6.0–10.0)
Monocytes Absolute: 0.8 10*3/uL (ref 0.1–1.7)
Monocytes: 13.2 % (ref 3.0–15.0)
Neutrophils %: 61.9 % (ref 42.0–78.0)
Neutrophils Absolute: 3.8 10*3/uL (ref 1.7–8.6)
PLT CT: 107 10*3/uL — ABNORMAL LOW (ref 130–440)
RBC: 4.7 10*6/uL (ref 3.80–5.00)
RDW: 12.1 % (ref 10.5–14.5)
WBC: 6.1 10*3/uL (ref 4.00–11.00)

## 2014-08-27 LAB — COMPREHENSIVE METABOLIC PANEL
ALT: 23 U/L (ref 0–55)
AST (SGOT): 21 U/L (ref 10–42)
Albumin/Globulin Ratio: 1.65 Ratio — ABNORMAL HIGH (ref 0.70–1.50)
Albumin: 3.8 gm/dL (ref 3.5–5.0)
Alkaline Phosphatase: 116 U/L (ref 40–145)
Anion Gap: 15.4 mMol/L (ref 7.0–18.0)
BUN / Creatinine Ratio: 30.7 Ratio — ABNORMAL HIGH (ref 10.0–30.0)
BUN: 39 mg/dL — ABNORMAL HIGH (ref 7–22)
Bilirubin, Total: 0.8 mg/dL (ref 0.1–1.2)
CO2: 31 mMol/L — ABNORMAL HIGH (ref 20.0–30.0)
Calcium: 10.1 mg/dL (ref 8.5–10.5)
Chloride: 95 mMol/L — ABNORMAL LOW (ref 98–110)
Creatinine: 1.27 mg/dL — ABNORMAL HIGH (ref 0.60–1.20)
EGFR: 41 mL/min/{1.73_m2}
Globulin: 2.3 gm/dL (ref 2.0–4.0)
Glucose: 87 mg/dL (ref 70–99)
Osmolality Calc: 285 mOsm/kg (ref 275–300)
Potassium: 3.7 mMol/L (ref 3.5–5.3)
Protein, Total: 6.1 gm/dL (ref 6.0–8.3)
Sodium: 138 mMol/L (ref 136–147)

## 2014-08-27 LAB — PT/INR
PT INR: 1.7 — ABNORMAL HIGH (ref 0.8–1.2)
PT: 18.2 s — ABNORMAL HIGH (ref 9.4–12.5)

## 2014-08-27 NOTE — Progress Notes (Signed)
DISCHARGE ORDERS REVIEWED WITH PT, GOOD UNDERSTANDING VOICED. PT HAS ALL PERSONAL BELONGINGS

## 2014-08-27 NOTE — Discharge Instructions (Signed)
Right-Sided Congestive Heart Failure    The heart is a large muscle that pumps blood throughout the body. Blood carries oxygen to all the organs, muscles, and skin of your body. After the body takes the oxygen out of the blood, the blood returns to the heart. The right side of the heart collects that blood and pumps it to the lungs to get fresh oxygen. This oxygen-rich blood from the lungs then returns to the left side of the heart, where it is pumped back out to the rest of the body, starting the process all over.  Heart failure (HF) occurs when the heart muscle is weakened. This affects the pumping action of the heart.  When the right side of the heart is weakened, it can't handle the blood it is getting from the rest of the body. This blood returns to the heart through veins. When too much pressure builds up in the veins, fluid leaks out into the tissues. Gravity then causes that fluid to spread to those parts of the body that are the lowest. So one of the first symptoms of HF is swelling in the feet and ankles. If the condition worsens, the swelling can even go up past the knees.  Causes of heart failure   Coronary artery disease   Heart attack in the past. Heart attack isalso known as acute myocardial infarction, or AMI.   High blood pressure   Damaged heart valve   Diabetes   Obesity   Cigarette smoking   Alcohol abuse  Treatment  Heart failure is a chronic condition. There is no cure. The purpose of medical treatment is to improve the pumping action of the heart, and remove excess water from the body. A number of medicines can help reach this goal,improvesymptoms, and prevent the heart from becoming weaker. Another major goal is to better treat the causes of heart failure, such as diabetes, high blood pressure, and your lifestyle.  Home care   Check your weight every day. A sudden increase in weight gain could mean worsening heart failure.   Use the same scale every day.   Weigh yourself at  the same time every day.   Make sure the scale is on the floor, not on a rug.   Keep a record of your weight every day so your health care provider can see it. If you are not given a log sheet for this, keep a separate journal for this purpose.   Cut back on how much salt(sodium) you eat:   Avoid high-salt foods. These include olives, pickles, smoked meats, and salted potato chips.   Don't add salt to your food at the table. Use only small amounts of salt when cooking.   Follow your health care provider'srecommendations about how much fluid you should have.   Stop smoking.   Cut back on the amount of alcohol you drink.   Lose weight if you are overweight. The excess weight adds a lot of stress on the workload of the heart.   Stay active. Talk with your provider about an exercise program that is safe for your heart.   Keep your feet elevated to reduce swelling. Ask your provider about support hose as a preventive treatment for daytime leg swelling.  Besides taking your medicine as instructed, an important part of treatment is lifestyle changes. These include diet, physical activity, stopping smoking, and weight control.  Improve your diet. Often in the hospital, people are given a "heart healthy diet." This includes more   fresh foods, lower fat, less processed foods, and lower salt.  Follow-up care  Follow up with your health care provider, or as advised.Make sure to keep any appointments that were made for you. This can help better control heart failure.  If an X-ray was done, you will be told of any new findings that may affect your care.  Call 911  Call 911 if you:   Become severely short of breath   Feel lightheaded, or feel like you might pass out or faint   Have chest pain or discomfort that's different than usual, the medicines your provider told you to use for this don't help, or the pain lasts longer than 10 to 15 minutes   You suddenly develop a rapid heart rate  When to seek medical  advice  Call your health care provider right awayif you have any of these signs. They may mean yourheart failure is getting worse:   Sudden weight gain. This means more than 2or more pounds in 1 day or5pounds in 1 week, or whatever weight gain you were told to report by your provider.   Trouble breathing not related to being active   New or increased swelling of your legs or ankles   Swelling or pain in your abdomen   Breathing trouble at night. This means waking up short of breath orneeding more pillows to breathe.   Frequent coughing that doesn't go away   Feeling much more tired than usual   2000-2015 [employer] [address], [place] [address]. All rights reserved. This information is not intended as a substitute for professional medical care. Always follow your healthcare professional's instructions.

## 2014-08-27 NOTE — Plan of Care (Signed)
Problem: Safety  Goal: Patient will be free from injury during hospitalization  Pt remains free of injury

## 2014-08-27 NOTE — Progress Notes (Signed)
Received an order for home health services.  Patient had previously declined home health services.  However, case management has met with patient again this afternoon prior to discharge to discuss home health services again and the benefits of home health services.  Patient again with multiple attempts refused/declined home health services.  This information has been relied to nursing staff.  Transition coach will continue to follow patient after discharge.

## 2014-08-27 NOTE — Discharge Summary (Signed)
DISCHARGE NOTE    Date Time: 08/27/2014 9:41 AM  Patient Name: [name]  Attending Physician: Lesleigh Noe, MD    Date of Admission:   08/24/2014    Date of Discharge:   08/27/2014      Reason for Admission:   Hyperkalemia [E87.5]  CHF (congestive heart failure) [I50.9]  Acute on chronic congestive heart failure, unspecified congestive heart failure type [I50.9]    Problems:   Present on admission hospital problems  Present on Admission:   . CHF (congestive heart failure)  . Hyperkalemia  . Acute on chronic diastolic CHF (congestive heart failure)  . Paroxysmal atrial fibrillation  . Aortic regurgitation    Hospital Problems:  Principal Problem:    Acute on chronic diastolic CHF (congestive heart failure)  Active Problems:    CHF (congestive heart failure)    Paroxysmal atrial fibrillation    Hyperkalemia    On home oxygen therapy    Aortic regurgitation    Pacemaker      Problem Lists:  Patient Active Problem List   Diagnosis   . CHF (congestive heart failure)   . Acute on chronic congestive heart failure, unspecified congestive heart failure type   . Paroxysmal atrial fibrillation   . Hyperkalemia   . Acute on chronic diastolic CHF (congestive heart failure)   . On home oxygen therapy   . Aortic regurgitation   . Pacemaker         Procedures performed:   none    Hospital Course:   [name] is a 74 y.o. female with a history of CHF and Afib with a pacemaker in place who presented to the hospital with increasing dyspnea on exertion since falling in the shower 10 days prior to admission. She notes that she fell on her buttocks and had significant pain initially which has been improving and was currently not in pain in the ER. She notes that in addition to DOE she was experiencing orthopnea and dry cough. With her dry cough she occasionally has some chest pain but denies CP otherwise.  She is maintained on coumadin for her chronic Afib.   [name] was in CHF exacerbation  and we admitted her. She was placed initially on IV Lasix and potassium was treated. She initially had hyperkalemia but that was treated and was no longer an issue during her stay here.   In terms of her Afib she remained rate controlled and was on Coumadin.  Of note her spronolactone dose was changed during her stay here due to her potassium - but I will be sending her home on her home dose of Spironolactone and Lasix and next week when she sees Dr. Fransisca Kaufmann her electrolytes and renal function should be rechecked.   I am also concerned about her cognitive function and functional status. I will ask PT/OT and nursing to see her at home and Dr. Fransisca Kaufmann can perform an MMSE as outpatient.     Discharge Medications:     Current Discharge Medication List      CONTINUE these medications which have NOT CHANGED    Details   albuterol (PROVENTIL) (2.5 MG/3ML) 0.083% nebulizer solution Take 2.5 mg by nebulization every 6 (six) hours as needed for Wheezing.      atenolol (TENORMIN) 100 MG tablet Take 100 mg by mouth daily.      calcium carbonate (OS-CAL) 600 MG Tab tablet Take 600 mg by mouth 2 (two) times daily with meals.  Cephalexin 250 MG tablet Take 250 mg by mouth 3 (three) times daily.  Qty: 15 tablet, Refills: 0      clonazePAM (KLONOPIN) 0.5 MG tablet Take 0.5 mg by mouth 2 (two) times daily as needed.      diltiazem (TIAZAC) 240 MG 24 hr capsule Take 240 mg by mouth daily.      estradiol (ESTRACE) 0.1 MG/GM vaginal cream Place 2 g vaginally daily.      furosemide (LASIX) 40 MG tablet Take 1.5 tablets (60 mg total) by mouth daily.  Qty: 45 tablet, Refills: 0      HYDROcodone-acetaminophen (NORCO) 5-325 MG per tablet Take 1 tablet by mouth every 6 (six) hours as needed.      omeprazole (PRILOSEC) 20 MG capsule Take 20 mg by mouth daily.      potassium chloride (KLOR-CON) 20 MEQ packet Take 20 mEq by mouth 2 (two) times daily.      rosuvastatin (CRESTOR) 10 MG tablet Take 10 mg by mouth daily.      spironolactone  (ALDACTONE) 50 MG tablet Take 50 mg by mouth daily.      warfarin (COUMADIN) 3 MG tablet Take 1 tablet (3 mg total) by mouth daily.  Qty: 30 tablet, Refills: 0      benzonatate (TESSALON) 200 MG capsule Take 200 mg by mouth 3 (three) times daily as needed.      ketoconazole (NIZORAL) 2 % cream Apply topically daily.               Discharge Instructions:   Referral to home health PT/OT and nursing      Follow-up with Rakaric, Valinda Hoar, MD in 1 week      Signed by: Birdie Hopes

## 2014-08-27 NOTE — Plan of Care (Signed)
PT HAS ADEQUATELY MET GOALS OF CARE PLAN

## 2014-08-27 NOTE — Progress Notes (Signed)
Albuterol (UD) given.

## 2014-09-03 ENCOUNTER — Other Ambulatory Visit
Admission: RE | Admit: 2014-09-03 | Discharge: 2014-09-03 | Disposition: A | Discharge status: Home / Self Care | Payer: Medicare Other | Source: Ambulatory Visit | Attending: Internal Medicine | Admitting: Internal Medicine

## 2014-09-03 LAB — BASIC METABOLIC PANEL
Anion Gap: 13.7 mMol/L (ref 7.0–18.0)
BUN / Creatinine Ratio: 37.7 Ratio — ABNORMAL HIGH (ref 10.0–30.0)
BUN: 46 mg/dL — ABNORMAL HIGH (ref 7–22)
CO2: 23.2 mMol/L (ref 20.0–30.0)
Calcium: 10.2 mg/dL (ref 8.5–10.5)
Chloride: 103 mMol/L (ref 98–110)
Creatinine: 1.22 mg/dL — ABNORMAL HIGH (ref 0.60–1.20)
EGFR: 43 mL/min/{1.73_m2}
Glucose: 94 mg/dL (ref 70–99)
Osmolality Calc: 282 mOsm/kg (ref 275–300)
Potassium: 4.9 mMol/L (ref 3.5–5.3)
Sodium: 135 mMol/L — ABNORMAL LOW (ref 136–147)

## 2014-09-30 ENCOUNTER — Other Ambulatory Visit
Admission: RE | Admit: 2014-09-30 | Discharge: 2014-09-30 | Disposition: A | Discharge status: Home / Self Care | Payer: Medicare Other | Source: Ambulatory Visit | Attending: Internal Medicine | Admitting: Internal Medicine

## 2014-09-30 LAB — COMPREHENSIVE METABOLIC PANEL
ALT: 21 U/L (ref 0–55)
AST (SGOT): 20 U/L (ref 10–42)
Albumin/Globulin Ratio: 1.74 Ratio — ABNORMAL HIGH (ref 0.70–1.50)
Albumin: 4 gm/dL (ref 3.5–5.0)
Alkaline Phosphatase: 124 U/L (ref 40–145)
Anion Gap: 15.1 mMol/L (ref 7.0–18.0)
BUN / Creatinine Ratio: 34.7 Ratio — ABNORMAL HIGH (ref 10.0–30.0)
BUN: 43 mg/dL — ABNORMAL HIGH (ref 7–22)
Bilirubin, Total: 0.6 mg/dL (ref 0.1–1.2)
CO2: 25.4 mMol/L (ref 20.0–30.0)
Calcium: 9.9 mg/dL (ref 8.5–10.5)
Chloride: 103 mMol/L (ref 98–110)
Creatinine: 1.24 mg/dL — ABNORMAL HIGH (ref 0.60–1.20)
EGFR: 42 mL/min/{1.73_m2}
Globulin: 2.3 gm/dL (ref 2.0–4.0)
Glucose: 97 mg/dL (ref 70–99)
Osmolality Calc: 288 mOsm/kg (ref 275–300)
Potassium: 4.5 mMol/L (ref 3.5–5.3)
Protein, Total: 6.3 gm/dL (ref 6.0–8.3)
Sodium: 139 mMol/L (ref 136–147)

## 2014-09-30 LAB — LIPID PANEL
Cholesterol: 133 mg/dL (ref 75–199)
Coronary Heart Disease Risk: 2.61
HDL: 51 mg/dL (ref 45–65)
LDL Calculated: 67 mg/dL
Triglycerides: 73 mg/dL (ref 10–150)
VLDL: 15 (ref 0–40)

## 2014-09-30 LAB — CBC AND DIFFERENTIAL
Basophils %: 1.7 % (ref 0.0–3.0)
Basophils Absolute: 0.1 10*3/uL (ref 0.0–0.3)
Eosinophils %: 1.6 % (ref 0.0–7.0)
Eosinophils Absolute: 0.1 10*3/uL (ref 0.0–0.8)
Hematocrit: 38.5 % (ref 36.0–48.0)
Hemoglobin: 12.5 gm/dL (ref 12.0–16.0)
Lymphocytes Absolute: 0.8 10*3/uL (ref 0.6–5.1)
Lymphocytes: 12.1 % — ABNORMAL LOW (ref 15.0–46.0)
MCH: 32 pg (ref 28–35)
MCHC: 33 gm/dL (ref 32–36)
MCV: 98 fL (ref 80–100)
MPV: 9.7 fL (ref 6.0–10.0)
Monocytes Absolute: 0.6 10*3/uL (ref 0.1–1.7)
Monocytes: 9.4 % (ref 3.0–15.0)
Neutrophils %: 75.2 % (ref 42.0–78.0)
Neutrophils Absolute: 5 10*3/uL (ref 1.7–8.6)
PLT CT: 106 10*3/uL — ABNORMAL LOW (ref 130–440)
RBC: 3.93 10*6/uL (ref 3.80–5.00)
RDW: 13.6 % (ref 11.0–14.0)
WBC: 6.6 10*3/uL (ref 4.0–11.0)

## 2014-09-30 LAB — PT/INR
PT INR: 2.2 — ABNORMAL HIGH (ref 0.5–1.3)
PT: 23.5 s — ABNORMAL HIGH (ref 9.5–11.5)

## 2015-02-02 ENCOUNTER — Other Ambulatory Visit: Payer: Self-pay | Admitting: Internal Medicine

## 2015-02-02 DIAGNOSIS — R928 Other abnormal and inconclusive findings on diagnostic imaging of breast: Secondary | ICD-10-CM

## 2015-02-02 DIAGNOSIS — Z09 Encounter for follow-up examination after completed treatment for conditions other than malignant neoplasm: Secondary | ICD-10-CM

## 2015-02-09 ENCOUNTER — Ambulatory Visit
Admission: RE | Admit: 2015-02-09 | Discharge: 2015-02-09 | Disposition: A | Discharge status: Home / Self Care | Payer: Medicare Other | Source: Ambulatory Visit | Attending: Internal Medicine | Admitting: Internal Medicine

## 2015-02-09 ENCOUNTER — Other Ambulatory Visit: Payer: Self-pay | Admitting: Internal Medicine

## 2015-02-09 DIAGNOSIS — R928 Other abnormal and inconclusive findings on diagnostic imaging of breast: Secondary | ICD-10-CM | POA: Insufficient documentation

## 2015-02-09 DIAGNOSIS — Z09 Encounter for follow-up examination after completed treatment for conditions other than malignant neoplasm: Secondary | ICD-10-CM

## 2015-03-22 ENCOUNTER — Ambulatory Visit
Admission: RE | Admit: 2015-03-22 | Discharge: 2015-03-22 | Disposition: A | Discharge status: Home / Self Care | Payer: Medicare Other | Attending: Interventional Cardiology | Admitting: Interventional Cardiology

## 2015-03-22 ENCOUNTER — Encounter
Admission: RE | Disposition: A | Discharge status: Home / Self Care | Payer: Self-pay | Source: Home / Self Care | Attending: Interventional Cardiology

## 2015-03-22 ENCOUNTER — Ambulatory Visit: Payer: Medicare Other | Admitting: Interventional Cardiology

## 2015-03-22 DIAGNOSIS — Z803 Family history of malignant neoplasm of breast: Secondary | ICD-10-CM | POA: Insufficient documentation

## 2015-03-22 DIAGNOSIS — Z90710 Acquired absence of both cervix and uterus: Secondary | ICD-10-CM | POA: Insufficient documentation

## 2015-03-22 DIAGNOSIS — Z45018 Encounter for adjustment and management of other part of cardiac pacemaker: Secondary | ICD-10-CM

## 2015-03-22 DIAGNOSIS — Z888 Allergy status to other drugs, medicaments and biological substances status: Secondary | ICD-10-CM | POA: Insufficient documentation

## 2015-03-22 DIAGNOSIS — I482 Chronic atrial fibrillation: Secondary | ICD-10-CM | POA: Insufficient documentation

## 2015-03-22 DIAGNOSIS — Z7901 Long term (current) use of anticoagulants: Secondary | ICD-10-CM | POA: Insufficient documentation

## 2015-03-22 DIAGNOSIS — Z885 Allergy status to narcotic agent status: Secondary | ICD-10-CM | POA: Insufficient documentation

## 2015-03-22 DIAGNOSIS — J449 Chronic obstructive pulmonary disease, unspecified: Secondary | ICD-10-CM | POA: Insufficient documentation

## 2015-03-22 DIAGNOSIS — J45909 Unspecified asthma, uncomplicated: Secondary | ICD-10-CM | POA: Insufficient documentation

## 2015-03-22 DIAGNOSIS — Z9981 Dependence on supplemental oxygen: Secondary | ICD-10-CM | POA: Insufficient documentation

## 2015-03-22 DIAGNOSIS — R0600 Dyspnea, unspecified: Secondary | ICD-10-CM | POA: Insufficient documentation

## 2015-03-22 DIAGNOSIS — Z45010 Encounter for checking and testing of cardiac pacemaker pulse generator [battery]: Secondary | ICD-10-CM | POA: Insufficient documentation

## 2015-03-22 DIAGNOSIS — I2510 Atherosclerotic heart disease of native coronary artery without angina pectoris: Secondary | ICD-10-CM | POA: Insufficient documentation

## 2015-03-22 DIAGNOSIS — I10 Essential (primary) hypertension: Secondary | ICD-10-CM | POA: Insufficient documentation

## 2015-03-22 LAB — I-STAT CHEM 8 CARTRIDGE
Anion Gap I-Stat: 18 — ABNORMAL HIGH (ref 7–16)
BUN I-Stat: 36 mg/dL — ABNORMAL HIGH (ref 6–20)
Calcium Ionized I-Stat: 5.2 mg/dL — ABNORMAL HIGH (ref 4.35–5.10)
Chloride I-Stat: 101 mMol/L (ref 98–112)
Creatinine I-Stat: 1.2 mg/dL — ABNORMAL HIGH (ref 0.60–1.10)
EGFR: 44 mL/min/{1.73_m2}
Glucose I-Stat: 110 mg/dL — ABNORMAL HIGH (ref 70–99)
Hematocrit I-Stat: 40 % (ref 36.0–48.0)
Hemoglobin I-Stat: 13.6 gm/dL (ref 12.0–16.0)
Potassium I-Stat: 4.7 mMol/L (ref 3.5–5.3)
Sodium I-Stat: 141 mMol/L (ref 135–145)
TCO2 I-Stat: 29 mMol/L (ref 24–29)

## 2015-03-22 LAB — CBC AND DIFFERENTIAL
Basophils %: 0.1 % (ref 0.0–3.0)
Basophils Absolute: 0 10*3/uL (ref 0.0–0.3)
Eosinophils %: 0.9 % (ref 0.0–7.0)
Eosinophils Absolute: 0.1 10*3/uL (ref 0.0–0.8)
Hematocrit: 39 % (ref 36.0–48.0)
Hemoglobin: 12.9 gm/dL (ref 12.0–16.0)
Lymphocytes Absolute: 1.2 10*3/uL (ref 0.6–5.1)
Lymphocytes: 16.2 % (ref 15.0–46.0)
MCH: 33 pg (ref 28–35)
MCHC: 33 gm/dL (ref 32–36)
MCV: 98 fL (ref 80–100)
MPV: 9 fL (ref 6.0–10.0)
Monocytes Absolute: 0.9 10*3/uL (ref 0.1–1.7)
Monocytes: 13 % (ref 3.0–15.0)
Neutrophils %: 69.8 % (ref 42.0–78.0)
Neutrophils Absolute: 5 10*3/uL (ref 1.7–8.6)
PLT CT: 88 10*3/uL — ABNORMAL LOW (ref 130–440)
RBC: 3.99 10*6/uL (ref 3.80–5.00)
RDW: 12.9 % (ref 11.0–14.0)
WBC: 7.1 10*3/uL (ref 4.0–11.0)

## 2015-03-22 LAB — B-TYPE NATRIURETIC PEPTIDE: B-Natriuretic Peptide: 429 pg/mL — ABNORMAL HIGH (ref 0.0–100.0)

## 2015-03-22 LAB — VH I-STAT INR: i-STAT INR: 1.7 (ref 0.0–3.0)

## 2015-03-22 SURGERY — PM GENERATOR CHANGE DUAL/BI-V

## 2015-03-22 MED ORDER — BACITRACIN +/- ZINC 500 UNIT/GM EX OINT (WRAP)
TOPICAL_OINTMENT | CUTANEOUS | Status: AC
Start: 2015-03-22 — End: 2015-03-22
  Administered 2015-03-22: 1 g via TOPICAL
  Filled 2015-03-22: qty 1

## 2015-03-22 MED ORDER — DIPHENHYDRAMINE HCL 50 MG/ML IJ SOLN
INTRAMUSCULAR | Status: DC
Start: 2015-03-22 — End: 2015-03-22
  Filled 2015-03-22: qty 1

## 2015-03-22 MED ORDER — SODIUM CHLORIDE 0.9 % IV SOLN
INTRAVENOUS | Status: AC
Start: 2015-03-22 — End: 2015-03-22
  Filled 2015-03-22: qty 100

## 2015-03-22 MED ORDER — LIDOCAINE HCL 2 % IJ SOLN
INTRAMUSCULAR | Status: AC
Start: 2015-03-22 — End: 2015-03-22
  Administered 2015-03-22: 40 mL
  Filled 2015-03-22: qty 40

## 2015-03-22 MED ORDER — FENTANYL CITRATE (PF) 50 MCG/ML IJ SOLN (WRAP)
INTRAMUSCULAR | Status: AC
Start: 2015-03-22 — End: 2015-03-22
  Administered 2015-03-22 (×2): 50 ug via INTRAVENOUS
  Filled 2015-03-22: qty 2

## 2015-03-22 MED ORDER — CEFAZOLIN SODIUM 1 G IJ SOLR
INTRAMUSCULAR | Status: AC
Start: 2015-03-22 — End: 2015-03-22
  Administered 2015-03-22: 1 g via INTRAVENOUS
  Filled 2015-03-22: qty 1000

## 2015-03-22 MED ORDER — MIDAZOLAM HCL 2 MG/2ML IJ SOLN
INTRAMUSCULAR | Status: AC
Start: 2015-03-22 — End: 2015-03-22
  Administered 2015-03-22 (×2): 1 mg via INTRAVENOUS
  Filled 2015-03-22: qty 2

## 2015-03-22 MED ORDER — VH BACITRACIN POLYMIXIN B 25000-500000 UNITS/500 ML NS IRRIGATION
Status: AC
Start: 2015-03-22 — End: 2015-03-22
  Administered 2015-03-22: 500 mL via SUBCUTANEOUS
  Filled 2015-03-22: qty 500

## 2015-03-22 MED ORDER — METHYLPREDNISOLONE SODIUM SUCC 125 MG IJ SOLR
INTRAMUSCULAR | Status: DC
Start: 2015-03-22 — End: 2015-03-22
  Filled 2015-03-22: qty 2

## 2015-03-22 MED ORDER — SODIUM CHLORIDE 0.9 % IV MBP
1.0000 g | Freq: Once | INTRAVENOUS | Status: DC
Start: 2015-03-22 — End: 2015-03-22

## 2015-03-22 NOTE — Progress Notes (Signed)
Discharge instructions reviewed with pt, pt verbalized understanding, IV discontinued, cath intact, tele off, pt up to get dressed, pt discharge via wheelchair to home with family.

## 2015-03-22 NOTE — Discharge Instructions (Signed)
Pacemaker, Defibrillator, Loop Recorder Discharge Instructions with Suture/Staples  Dr. Reese  Incision Care:  1- Keep your device site dry for 3 days  2-You may shower after 3 days and wash the site   3-Leave gauze or tan dressings in place for 3 days, if it becomes soiled replace the dressing with a new one.  4-The steri-strips (thin strips of paper tape) over the incision will begin to curl up around the edges. Once loose they may be removed    Activity and Follow up:   1- Avoid heavy lifting or any other excessive arm movement for 3 weeks.  Your shoulder will be sore for several weeks. Bruising is common and may extend to the nipple or arm.  If your doctor orders a sling, please wear it for the first 24 hours.   2- Dr. Reese will give you instructions when you can drive again.   3-Microwaves are not a hazard.  4-Airport metal detectors will not harm the pacemaker, but may be "detected".  Always carry your I.D. Card in your wallet so others know what kind of pacemaker you have.   5-Don't stand inside the anti-theft devices found in some stores for a prolonged amount of time as they may affect your pacemaker.  There is no problem walking through them.   6- Call your doctor if the pacemaker site has any signs of infection such as, redness, drainage, swelling, increasing pain, fever, or chills  7-A follow up will be made with Dr. Reese as directed.

## 2015-03-22 NOTE — Progress Notes (Signed)
Ishaped report given to Tara from cath lab, tele off,  pt to procedure via stretcher.

## 2015-03-22 NOTE — Progress Notes (Signed)
Hot food tray; no other needs at this time

## 2015-03-22 NOTE — Progress Notes (Signed)
Pt returned from procedure via stretcher, tele on, call bell within reach.  Movement restrictions reviewed with pt, pt verbalized understanding.  Left chest dressing clean, dry and intact

## 2015-03-22 NOTE — H&P (Signed)
ADMISSION HISTORY AND PHYSICAL EXAM    Date Time: 03/22/2015 7:45 AM  Patient Name: [name]  Attending Physician: Herbert Deaner, MD    Assessment:   1 pacemaker at end of life  2. Non obstructive CAD  3. Chronic afib on coumadin  4. Chronic oxgyen  5. Hypertension    Plan:   Patient will be seen and evaluated by Dr. Pecola Leisure. He will determine if appropriate to proceed with generator replacement. Risks and benefits of procedure are reviewed. Consent is signed.     History of Present Illness:   [name] is a 74 y.o. female who presents to the hospital for scheduled generator change.  She states she has continued with chronic dyspnea and home oxygen use. Denies any weight gain.  Denies any chest pain.  States she is feeling well.     Past Medical History:     Past Medical History   Diagnosis Date   . Atrial fibrillation    . Malignant neoplasm    . Pacemaker    . Hypertension    . Aortic aneurysm    . Chronic obstructive pulmonary disease        1.  History of preserved left ventricular function.  2.  Nonobstructive coronary artery disease by catheterization.  3.  Probable permanent atrial fibrillation on coumadin  4.  pacemaker Med[employer]onic  5. 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 Asthma/chronic O2 use  7. HTN      Past Surgical History:     Past Surgical History   Procedure Laterality Date   . Hysterectomy     . Cardiac pacemaker placement       2002       Family History:     Family History   Problem Relation Age of Onset   . Breast cancer Sister        Social History:     Social History     Social History   . Marital Status: Widowed     Spouse Name: N/A   . Number of Children: N/A   . Years of Education: N/A     Social History Main Topics   . Smoking status: Never Smoker    . Smokeless tobacco: Not on file   . Alcohol Use: No   . Drug Use: No   . Sexual Activity: Not on file     Other Topics Concern   . Not on file     Social History Narrative       Allergies:     Allergies    Allergen Reactions   . Codeine    . Morphine    . Nitroglycerin    . Pain Patch [Menthol]        Medications:     Prescriptions prior to admission   Medication Sig   . albuterol (PROVENTIL) (2.5 MG/3ML) 0.083% nebulizer solution Take 2.5 mg by nebulization every 6 (six) hours as needed for Wheezing.   Marland Kitchen atenolol (TENORMIN) 100 MG tablet Take 100 mg by mouth daily.   . benzonatate (TESSALON) 200 MG capsule Take 200 mg by mouth 3 (three) times daily as needed.   . calcium carbonate (OS-CAL) 600 MG Tab tablet Take 600 mg by mouth 2 (two) times daily with meals.   . Cephalexin 250 MG tablet Take 250 mg by mouth 3 (three) times daily.   . clonazePAM (KLONOPIN) 0.5 MG tablet Take 0.5 mg by mouth 2 (two) times daily as  needed.   . diltiazem (TIAZAC) 240 MG 24 hr capsule Take 240 mg by mouth daily.   Marland Kitchen estradiol (ESTRACE) 0.1 MG/GM vaginal cream Place 2 g vaginally daily.   . furosemide (LASIX) 40 MG tablet Take 1.5 tablets (60 mg total) by mouth daily.   Marland Kitchen HYDROcodone-acetaminophen (NORCO) 5-325 MG per tablet Take 1 tablet by mouth every 6 (six) hours as needed.   Marland Kitchen ketoconazole (NIZORAL) 2 % cream Apply topically daily.   Marland Kitchen omeprazole (PRILOSEC) 20 MG capsule Take 20 mg by mouth daily.   . potassium chloride (KLOR-CON) 20 MEQ packet Take 20 mEq by mouth 2 (two) times daily.   . rosuvastatin (CRESTOR) 10 MG tablet Take 10 mg by mouth daily.   Marland Kitchen spironolactone (ALDACTONE) 50 MG tablet Take 50 mg by mouth daily.   Marland Kitchen warfarin (COUMADIN) 3 MG tablet Take 1 tablet (3 mg total) by mouth daily. (Patient taking differently: Take 2.5 mg by mouth daily.   )       Review of Systems:   A comprehensive review of systems was: History obtained from the patient  General ROS: positive for  - fatigue  negative for - chills or fever  Psychological ROS: negative  Respiratory ROS: positive for - cough, orthopnea and shortness of breath  negative for - sputum changes  Cardiovascular ROS: positive for - dyspnea on exertion  negative for -  chest pain  Gastrointestinal ROS: negative for - abdominal pain  Genito-Urinary ROS: negative for - dysuria  Neurological ROS: no TIA or stroke symptoms    Physical Exam:   There were no vitals filed for this visit.    Intake and Output Summary (Last 24 hours) at Date Time  No intake or output data in the 24 hours ending 03/22/15 0745    General appearance - overweight and chronically ill appearing  Mental status - alert, oriented to person, place, and time  Mouth - Mallampati score III  Neck - supple, no significant adenopathy and no carotid bruits  Chest - crackles left lower lobe, diminished by clear  Heart - irregular ii/vi sem  Abdomen - obese soft  Neurological - alert, oriented, normal speech, no focal findings or movement disorder noted  Extremities - no edema    Labs:     Results     ** No results found for the last 24 hours. **          Recent CBC No results for input(s): RBC, HGB, HCT, MCV, MCH, MCHC, RDW, MPV, LABPLAT in the last 24 hours.    Invalid input(s): WHITEBLOODCE,  NRBCA,  REFLX,  ANRBA    Signed by: Elizbeth Squires          I hereby certify this patient for hospitalization based upon medical necessity as noted above.

## 2015-03-24 LAB — ECG 12-LEAD
P-R Interval: 0 ms
Patient Age: 74 years
Q-T Interval(Corrected): 485 ms
Q-T Interval: 466 ms
QRS Axis: -74 deg
QRS Duration: 172 ms
T Axis: 95 years
Ventricular Rate: 65 //min

## 2015-06-16 ENCOUNTER — Other Ambulatory Visit
Admission: RE | Admit: 2015-06-16 | Discharge: 2015-06-16 | Disposition: A | Discharge status: Home / Self Care | Payer: Medicare Other | Source: Ambulatory Visit | Attending: Internal Medicine | Admitting: Internal Medicine

## 2015-06-16 LAB — COMPREHENSIVE METABOLIC PANEL
ALT: 20 U/L (ref 0–55)
AST (SGOT): 23 U/L (ref 10–42)
Albumin/Globulin Ratio: 1.71 Ratio — ABNORMAL HIGH (ref 0.70–1.50)
Albumin: 4.1 gm/dL (ref 3.5–5.0)
Alkaline Phosphatase: 135 U/L (ref 40–145)
Anion Gap: 15.2 mMol/L (ref 7.0–18.0)
BUN / Creatinine Ratio: 27.7 Ratio (ref 10.0–30.0)
BUN: 41 mg/dL — ABNORMAL HIGH (ref 7–22)
Bilirubin, Total: 0.6 mg/dL (ref 0.1–1.2)
CO2: 26.6 mMol/L (ref 20.0–30.0)
Calcium: 9.7 mg/dL (ref 8.5–10.5)
Chloride: 103 mMol/L (ref 98–110)
Creatinine: 1.48 mg/dL — ABNORMAL HIGH (ref 0.60–1.20)
EGFR: 34 mL/min/{1.73_m2}
Globulin: 2.4 gm/dL (ref 2.0–4.0)
Glucose: 108 mg/dL — ABNORMAL HIGH (ref 70–99)
Osmolality Calc: 290 mOsm/kg (ref 275–300)
Potassium: 4.8 mMol/L (ref 3.5–5.3)
Protein, Total: 6.5 gm/dL (ref 6.0–8.3)
Sodium: 140 mMol/L (ref 136–147)

## 2015-06-16 LAB — CBC AND DIFFERENTIAL
Basophils %: 0.1 % (ref 0.0–3.0)
Basophils Absolute: 0 10*3/uL (ref 0.0–0.3)
Eosinophils %: 1.2 % (ref 0.0–7.0)
Eosinophils Absolute: 0.1 10*3/uL (ref 0.0–0.8)
Hematocrit: 40.6 % (ref 36.0–48.0)
Hemoglobin: 13.5 gm/dL (ref 12.0–16.0)
Lymphocytes Absolute: 1.8 10*3/uL (ref 0.6–5.1)
Lymphocytes: 28.9 % (ref 15.0–46.0)
MCH: 33 pg (ref 28–35)
MCHC: 33 gm/dL (ref 32–36)
MCV: 100 fL (ref 80–100)
MPV: 11 fL — ABNORMAL HIGH (ref 6.0–10.0)
Monocytes Absolute: 0.7 10*3/uL (ref 0.1–1.7)
Monocytes: 11 % (ref 3.0–15.0)
Neutrophils %: 58.8 % (ref 42.0–78.0)
Neutrophils Absolute: 3.6 10*3/uL (ref 1.7–8.6)
PLT CT: 81 10*3/uL — ABNORMAL LOW (ref 130–440)
RBC: 4.04 10*6/uL (ref 3.80–5.00)
RDW: 13.2 % (ref 11.0–14.0)
WBC: 6.1 10*3/uL (ref 4.0–11.0)

## 2015-06-16 LAB — LIPID PANEL
Cholesterol: 131 mg/dL (ref 75–199)
Coronary Heart Disease Risk: 2.52
HDL: 52 mg/dL (ref 45–65)
LDL Calculated: 59 mg/dL
Triglycerides: 100 mg/dL (ref 10–150)
VLDL: 20 (ref 0–40)

## 2015-07-16 ENCOUNTER — Inpatient Hospital Stay
Admission: EM | Admit: 2015-07-16 | Discharge: 2015-07-19 | DRG: 292 | Disposition: A | Discharge status: Home / Self Care | Payer: Medicare Other | Attending: Internal Medicine | Admitting: Internal Medicine

## 2015-07-16 ENCOUNTER — Emergency Department: Payer: Medicare Other

## 2015-07-16 ENCOUNTER — Inpatient Hospital Stay: Payer: Medicare Other | Admitting: Internal Medicine

## 2015-07-16 DIAGNOSIS — K219 Gastro-esophageal reflux disease without esophagitis: Secondary | ICD-10-CM | POA: Diagnosis present

## 2015-07-16 DIAGNOSIS — Z950 Presence of cardiac pacemaker: Secondary | ICD-10-CM

## 2015-07-16 DIAGNOSIS — Z9981 Dependence on supplemental oxygen: Secondary | ICD-10-CM

## 2015-07-16 DIAGNOSIS — I272 Other secondary pulmonary hypertension: Secondary | ICD-10-CM | POA: Diagnosis present

## 2015-07-16 DIAGNOSIS — Z888 Allergy status to other drugs, medicaments and biological substances status: Secondary | ICD-10-CM

## 2015-07-16 DIAGNOSIS — I5033 Acute on chronic diastolic (congestive) heart failure: Secondary | ICD-10-CM | POA: Diagnosis present

## 2015-07-16 DIAGNOSIS — I083 Combined rheumatic disorders of mitral, aortic and tricuspid valves: Secondary | ICD-10-CM | POA: Diagnosis present

## 2015-07-16 DIAGNOSIS — I712 Thoracic aortic aneurysm, without rupture, unspecified: Secondary | ICD-10-CM | POA: Diagnosis present

## 2015-07-16 DIAGNOSIS — I110 Hypertensive heart disease with heart failure: Principal | ICD-10-CM | POA: Diagnosis present

## 2015-07-16 DIAGNOSIS — Z8249 Family history of ischemic heart disease and other diseases of the circulatory system: Secondary | ICD-10-CM

## 2015-07-16 DIAGNOSIS — Q791 Other congenital malformations of diaphragm: Secondary | ICD-10-CM

## 2015-07-16 DIAGNOSIS — Z90710 Acquired absence of both cervix and uterus: Secondary | ICD-10-CM

## 2015-07-16 DIAGNOSIS — J849 Interstitial pulmonary disease, unspecified: Secondary | ICD-10-CM | POA: Diagnosis present

## 2015-07-16 DIAGNOSIS — Z91048 Other nonmedicinal substance allergy status: Secondary | ICD-10-CM

## 2015-07-16 DIAGNOSIS — Z7901 Long term (current) use of anticoagulants: Secondary | ICD-10-CM

## 2015-07-16 DIAGNOSIS — I4891 Unspecified atrial fibrillation: Secondary | ICD-10-CM | POA: Diagnosis present

## 2015-07-16 DIAGNOSIS — I509 Heart failure, unspecified: Secondary | ICD-10-CM | POA: Diagnosis present

## 2015-07-16 DIAGNOSIS — Z79899 Other long term (current) drug therapy: Secondary | ICD-10-CM

## 2015-07-16 DIAGNOSIS — Z885 Allergy status to narcotic agent status: Secondary | ICD-10-CM

## 2015-07-16 DIAGNOSIS — Z803 Family history of malignant neoplasm of breast: Secondary | ICD-10-CM

## 2015-07-16 LAB — CBC AND DIFFERENTIAL
Basophils %: 0.8 % (ref 0.0–3.0)
Basophils Absolute: 0.1 10*3/uL (ref 0.0–0.3)
Eosinophils %: 0.5 % (ref 0.0–7.0)
Eosinophils Absolute: 0 10*3/uL (ref 0.0–0.8)
Hematocrit: 43.4 % (ref 36.0–48.0)
Hemoglobin: 14.7 gm/dL (ref 12.0–16.0)
Lymphocytes Absolute: 1.9 10*3/uL (ref 0.6–5.1)
Lymphocytes: 20 % (ref 15.0–46.0)
MCH: 32 pg (ref 28–35)
MCHC: 34 gm/dL (ref 31–36)
MCV: 94 fL (ref 80–100)
MPV: 8.8 fL (ref 6.0–10.0)
Monocytes Absolute: 0.9 10*3/uL (ref 0.1–1.7)
Monocytes: 9.1 % (ref 3.0–15.0)
Neutrophils %: 69.6 % (ref 42.0–78.0)
Neutrophils Absolute: 6.6 10*3/uL (ref 1.7–8.6)
PLT CT: 111 10*3/uL — ABNORMAL LOW (ref 130–440)
RBC: 4.6 10*6/uL (ref 3.80–5.00)
RDW: 12.7 % (ref 10.5–14.5)
WBC: 9.5 10*3/uL (ref 4.00–11.00)

## 2015-07-16 LAB — COMPREHENSIVE METABOLIC PANEL
ALT: 6 U/L (ref 0–55)
AST (SGOT): 21 U/L (ref 10–42)
Albumin/Globulin Ratio: 1.46 Ratio (ref 0.70–1.50)
Albumin: 4.4 gm/dL (ref 3.5–5.0)
Alkaline Phosphatase: 138 U/L (ref 40–145)
Anion Gap: 15.7 mMol/L (ref 7.0–18.0)
BUN / Creatinine Ratio: 25.2 Ratio (ref 10.0–30.0)
BUN: 37 mg/dL — ABNORMAL HIGH (ref 7–22)
Bilirubin, Total: 0.9 mg/dL (ref 0.1–1.2)
CO2: 28 mMol/L (ref 20.0–30.0)
Calcium: 10.4 mg/dL (ref 8.5–10.5)
Chloride: 101 mMol/L (ref 98–110)
Creatinine: 1.47 mg/dL — ABNORMAL HIGH (ref 0.60–1.20)
EGFR: 34 mL/min/{1.73_m2}
Globulin: 3 gm/dL (ref 2.0–4.0)
Glucose: 124 mg/dL — ABNORMAL HIGH (ref 70–99)
Osmolality Calc: 290 mOsm/kg (ref 275–300)
Potassium: 4.6 mMol/L (ref 3.5–5.3)
Protein, Total: 7.4 gm/dL (ref 6.0–8.3)
Sodium: 140 mMol/L (ref 136–147)

## 2015-07-16 LAB — PT/INR
PT INR: 2.8 — ABNORMAL HIGH (ref 0.8–1.2)
PT: 31 s — ABNORMAL HIGH (ref 9.4–12.5)

## 2015-07-16 LAB — B-TYPE NATRIURETIC PEPTIDE: B-Natriuretic Peptide: 1271.6 pg/mL — ABNORMAL HIGH (ref 0.0–100.0)

## 2015-07-16 LAB — TROPONIN I: Troponin I: 0.02 ng/mL (ref 0.00–0.02)

## 2015-07-16 MED ORDER — CARBIDOPA-LEVODOPA 25-100 MG PO TABS
1.0000 | ORAL_TABLET | Freq: Two times a day (BID) | ORAL | Status: DC
Start: 2015-07-16 — End: 2015-07-19
  Administered 2015-07-17 – 2015-07-19 (×5): 1 via ORAL
  Filled 2015-07-16 (×6): qty 1

## 2015-07-16 MED ORDER — FUROSEMIDE 10 MG/ML IJ SOLN
80.0000 mg | Freq: Once | INTRAMUSCULAR | Status: AC
Start: 2015-07-16 — End: 2015-07-16
  Administered 2015-07-16: 80 mg via INTRAVENOUS

## 2015-07-16 MED ORDER — FUROSEMIDE 40 MG PO TABS
60.0000 mg | ORAL_TABLET | Freq: Every day | ORAL | Status: DC
Start: 2015-07-16 — End: 2015-07-17
  Administered 2015-07-17: 60 mg via ORAL
  Filled 2015-07-16 (×4): qty 1

## 2015-07-16 MED ORDER — FUROSEMIDE 10 MG/ML IJ SOLN
INTRAMUSCULAR | Status: AC
Start: 2015-07-16 — End: ?
  Filled 2015-07-16: qty 8

## 2015-07-16 MED ORDER — ROSUVASTATIN CALCIUM 10 MG PO TABS
10.0000 mg | ORAL_TABLET | Freq: Every evening | ORAL | Status: DC
Start: 2015-07-16 — End: 2015-07-19
  Administered 2015-07-16 – 2015-07-18 (×3): 10 mg via ORAL
  Filled 2015-07-16 (×3): qty 1

## 2015-07-16 MED ORDER — ATENOLOL 50 MG PO TABS
100.0000 mg | ORAL_TABLET | Freq: Every day | ORAL | Status: DC
Start: 2015-07-16 — End: 2015-07-17
  Administered 2015-07-17: 100 mg via ORAL
  Filled 2015-07-16 (×2): qty 2

## 2015-07-16 MED ORDER — CLONAZEPAM 0.5 MG PO TABS
0.5000 mg | ORAL_TABLET | Freq: Two times a day (BID) | ORAL | Status: DC | PRN
Start: 2015-07-16 — End: 2015-07-19
  Administered 2015-07-17 – 2015-07-18 (×2): 0.5 mg via ORAL
  Filled 2015-07-16 (×2): qty 1

## 2015-07-16 MED ORDER — LATANOPROST 0.005 % OP SOLN
1.0000 [drp] | Freq: Every evening | OPHTHALMIC | Status: DC
Start: 2015-07-16 — End: 2015-07-19
  Administered 2015-07-16 – 2015-07-18 (×3): 1 [drp] via OPHTHALMIC
  Filled 2015-07-16: qty 1

## 2015-07-16 MED ORDER — SPIRONOLACTONE 25 MG PO TABS
50.0000 mg | ORAL_TABLET | Freq: Every day | ORAL | Status: DC
Start: 2015-07-16 — End: 2015-07-19
  Administered 2015-07-17 – 2015-07-19 (×3): 50 mg via ORAL
  Filled 2015-07-16 (×4): qty 2

## 2015-07-16 MED ORDER — VH WARFARIN THERAPY PLACEHOLDER
1.0000 | Status: DC
Start: 2015-07-16 — End: 2015-07-19

## 2015-07-16 MED ORDER — POTASSIUM CHLORIDE CRYS ER 20 MEQ PO TBCR
20.0000 meq | EXTENDED_RELEASE_TABLET | Freq: Every day | ORAL | Status: DC
Start: 2015-07-17 — End: 2015-07-19
  Administered 2015-07-17 – 2015-07-19 (×3): 20 meq via ORAL
  Filled 2015-07-16 (×3): qty 1

## 2015-07-16 MED ORDER — HYDROCODONE-ACETAMINOPHEN 5-325 MG PO TABS
2.0000 | ORAL_TABLET | Freq: Two times a day (BID) | ORAL | Status: DC
Start: 2015-07-16 — End: 2015-07-19
  Administered 2015-07-16 – 2015-07-19 (×6): 2 via ORAL
  Filled 2015-07-16 (×6): qty 2

## 2015-07-16 MED ORDER — OXYBUTYNIN CHLORIDE 5 MG PO TABS
5.0000 mg | ORAL_TABLET | Freq: Two times a day (BID) | ORAL | Status: DC
Start: 2015-07-16 — End: 2015-07-19
  Administered 2015-07-17 – 2015-07-19 (×5): 5 mg via ORAL
  Filled 2015-07-16 (×6): qty 1

## 2015-07-16 MED ORDER — POTASSIUM CHLORIDE 20 MEQ/15ML (10%) PO SOLN
20.0000 meq | Freq: Every day | ORAL | Status: DC
Start: 2015-07-16 — End: 2015-07-16
  Filled 2015-07-16: qty 15

## 2015-07-16 MED ORDER — ALBUTEROL SULFATE (2.5 MG/3ML) 0.083% IN NEBU
2.5000 mg | INHALATION_SOLUTION | RESPIRATORY_TRACT | Status: DC | PRN
Start: 2015-07-16 — End: 2015-07-19

## 2015-07-16 MED ORDER — WARFARIN SODIUM 3 MG PO TABS
3.0000 mg | ORAL_TABLET | Freq: Every day | ORAL | Status: DC
Start: 2015-07-16 — End: 2015-07-19
  Administered 2015-07-16 – 2015-07-18 (×3): 3 mg via ORAL
  Filled 2015-07-16 (×3): qty 1

## 2015-07-16 MED ORDER — FAMOTIDINE 20 MG PO TABS
20.0000 mg | ORAL_TABLET | Freq: Two times a day (BID) | ORAL | Status: DC
Start: 2015-07-16 — End: 2015-07-19
  Administered 2015-07-17 – 2015-07-19 (×5): 20 mg via ORAL
  Filled 2015-07-16 (×6): qty 1

## 2015-07-16 MED ORDER — DILTIAZEM HCL ER COATED BEADS 120 MG PO CP24
120.0000 mg | ORAL_CAPSULE | Freq: Every day | ORAL | Status: DC
Start: 2015-07-16 — End: 2015-07-19
  Administered 2015-07-16 – 2015-07-19 (×4): 120 mg via ORAL
  Filled 2015-07-16 (×4): qty 1

## 2015-07-16 MED ORDER — DEXAMETHASONE 0.1 % OP SUSP
1.0000 [drp] | Freq: Every day | OPHTHALMIC | Status: DC
Start: 2015-07-16 — End: 2015-07-19

## 2015-07-16 MED ORDER — DOCUSATE SODIUM 100 MG PO CAPS
100.0000 mg | ORAL_CAPSULE | Freq: Every day | ORAL | Status: DC
Start: 2015-07-16 — End: 2015-07-19
  Administered 2015-07-17 – 2015-07-19 (×3): 100 mg via ORAL
  Filled 2015-07-16 (×4): qty 1

## 2015-07-16 MED ORDER — ESTRADIOL 0.1 MG/GM VA CREA
2.0000 g | TOPICAL_CREAM | Freq: Every day | VAGINAL | Status: DC
Start: 2015-07-16 — End: 2015-07-19

## 2015-07-16 NOTE — ED Notes (Signed)
Pt positioned for comfort. Pt given IV lasix. BSC at bedside. Call bell on pt's lap for easy use to call for help.

## 2015-07-16 NOTE — ED Notes (Signed)
Registration made aware of pending admission to Med-Surg rm.219.

## 2015-07-16 NOTE — ED Provider Notes (Signed)
Physician/Midlevel provider first contact with patient: 07/16/15 1605         History     Chief Complaint   Patient presents with   . Shortness of Breath     HPI     [name] is a 75 y/o female who comes into the ED with a c/o shortness of breath beginning approximately 1 week ago which has been progressively worsening. Pt notes mild bilateral leg pain, shortness of breath (which worsens with walking), sleep disturbance (i.e., she has to sleep sitting up with 3 pillows), and abdominal pain. Pt denies any cough, fever, chills, chest pain, wheezing, or any other concurrent symptoms or additional concerns at this time. Pt has a history of CHF and pleural effusion but denies any COPD or emphysema. Pt reports normal bowels today.    Past Medical History   Diagnosis Date   . Atrial fibrillation    . Malignant neoplasm    . Pacemaker    . Hypertension    . Aortic aneurysm    . Chronic obstructive pulmonary disease    . Gastroesophageal reflux disease        Past Surgical History   Procedure Laterality Date   . Hysterectomy     . Cardiac pacemaker placement       2002       Family History   Problem Relation Age of Onset   . Breast cancer Sister        Social  Social History   Substance Use Topics   . Smoking status: Never Smoker    . Smokeless tobacco: None   . Alcohol Use: No       .     Allergies   Allergen Reactions   . Adhesive [Wound Dressing Adhesive]    . Codeine    . Morphine    . Nitroglycerin    . Pain Patch [Menthol]        Home Medications     Last Medication Reconciliation Action:  Complete Penelope Galas, RN 07/16/2015  8:53 PM                  albuterol (PROVENTIL) (2.5 MG/3ML) 0.083% nebulizer solution     Take 2.5 mg by nebulization every 6 (six) hours as needed for Wheezing.     atenolol (TENORMIN) 100 MG tablet     Take 100 mg by mouth daily.     benzonatate (TESSALON) 200 MG capsule     Take 200 mg by mouth 3 (three) times daily as needed.     calcium carbonate (OS-CAL) 600 MG Tab tablet      Take 600 mg by mouth 2 (two) times daily with meals.     carbidopa-levodopa (SINEMET) 25-100 MG per tablet     Take 1 tablet by mouth 2 (two) times daily.     clonazePAM (KLONOPIN) 0.5 MG tablet     Take 0.5 mg by mouth 2 (two) times daily as needed.     diltiazem (TIAZAC) 240 MG 24 hr capsule     Take 240 mg by mouth 2 (two) times daily.        docusate sodium (COLACE) 100 MG capsule     Take 100 mg by mouth daily.     estradiol (ESTRACE) 0.1 MG/GM vaginal cream     Place 2 g vaginally daily.     fluorometholone (EFLONE) 0.1 % ophthalmic suspension     1 drop 4 (four) times daily.  furosemide (LASIX) 40 MG tablet     Take 1.5 tablets (60 mg total) by mouth daily.     Patient taking differently:  Take 40 mg by mouth daily.        HYDROcodone-acetaminophen (NORCO) 5-325 MG per tablet     Take 2 tablets by mouth 2 (two) times daily.        ketoconazole (NIZORAL) 2 % cream     Apply topically daily.     latanoprost (XALATAN) 0.005 % ophthalmic solution     1 drop nightly.     omeprazole (PRILOSEC) 20 MG capsule     Take 20 mg by mouth daily.     oxybutynin (DITROPAN) 5 MG tablet     Take 5 mg by mouth 2 (two) times daily.     potassium chloride (KLOR-CON) 20 MEQ packet     Take 20 mEq by mouth 2 (two) times daily.     rosuvastatin (CRESTOR) 10 MG tablet     Take 10 mg by mouth daily.     spironolactone (ALDACTONE) 50 MG tablet     Take 50 mg by mouth daily.     warfarin (COUMADIN) 3 MG tablet     Take 1 tablet (3 mg total) by mouth daily.     Patient taking differently:  Take 2.5 mg by mouth daily.              Review of Systems   Constitutional: Negative for fever, chills and diaphoresis.   HENT: Negative for congestion and sore throat.    Respiratory: Positive for shortness of breath (1 week, worsens with walking). Negative for cough and wheezing.    Cardiovascular: Negative for chest pain.   Gastrointestinal: Positive for abdominal pain. Negative for nausea, vomiting and diarrhea.   Musculoskeletal: Negative for  myalgias, back pain and neck pain.   Skin: Negative for rash.   Neurological: Negative for weakness.   Psychiatric/Behavioral: Positive for sleep disturbance (must sleep sitting up).   All other systems reviewed and are negative.      Physical Exam    BP: 125/55 mmHg, Heart Rate: 61, Temp: (!) 96.3 F (35.7 C), Resp Rate: 22, SpO2: 100 %, Weight: 73.483 kg    Physical Exam   Constitutional: She is oriented to person, place, and time.   HENT:   Head: Normocephalic and atraumatic.   Eyes: Conjunctivae and EOM are normal. Pupils are equal, round, and reactive to light.   Anicteric    Neck: Normal range of motion. Neck supple. No JVD present.   Cardiovascular: Normal rate, regular rhythm, normal heart sounds and intact distal pulses.  Exam reveals no friction rub.    No murmur heard.  Pulmonary/Chest: Effort normal. She has no wheezes. She has rales (rales at the bases).   Abdominal: Soft. There is tenderness (mild tenderness). There is no rebound and no guarding.   Musculoskeletal: She exhibits no edema.   Neurological: She is alert and oriented to person, place, and time.   Skin: Skin is warm and dry.   Psychiatric: She has a normal mood and affect. Her behavior is normal.   Nursing note and vitals reviewed.        MDM and ED Course     ED Medication Orders     Start Ordered     Status Ordering Provider    07/16/15 1735 07/16/15 1734  furosemide (LASIX) injection 80 mg   Once in ED     Route: Intravenous  Ordered Dose: 80 mg     Last MAR action:  Given Maxximus Gotay A             MDM    4:12 PM -- Advised of assessment and of treatment plan to be employed while in ER. Answered all questions.        Procedures    Clinical Impression & Disposition     Clinical Impression  Final diagnoses:   Acute on chronic congestive heart failure, unspecified congestive heart failure type        ED Disposition     Observation Admitting Physician: Graciela Husbands [[id number]]  Diagnosis: Acute exacerbation of CHF (congestive heart failure)  [604540]  Estimated Length of Stay: < 2 midnights  Tentative Discharge Plan?: Home or Self Care [1]  Patient Class: Observation [104]             Current Discharge Medication List              This note was created with the assistance of my scribe, Maurine Simmering. All information was reviewed and confirmed by the provider.           Percival Spanish, MD  07/17/15 [phone]

## 2015-07-16 NOTE — ED Notes (Signed)
Pt assisted up to bedside commode. Pt states she is feeling much better.

## 2015-07-16 NOTE — ED Notes (Signed)
Pt to ED9 via w/c. Pt reports being SOB x 7 years.  Last Thursday, ate hamburger, immediately after, threw up the food. "I threw up all day on Thursday."   Increased SOB since vomiting. Pt thought breathing would get better, has remained ill. Denies fever. C/o pain all over.

## 2015-07-16 NOTE — Plan of Care (Signed)
Problem: Safety  Goal: Patient will be free from injury during hospitalization  Outcome: Progressing    Problem: Pain  Goal: Patient's pain/discomfort is manageable  Outcome: Progressing

## 2015-07-17 ENCOUNTER — Inpatient Hospital Stay: Payer: Medicare Other

## 2015-07-17 LAB — COMPREHENSIVE METABOLIC PANEL
ALT: 12 U/L (ref 0–55)
AST (SGOT): 16 U/L (ref 10–42)
Albumin/Globulin Ratio: 1.51 Ratio — ABNORMAL HIGH (ref 0.70–1.50)
Albumin: 3.6 gm/dL (ref 3.5–5.0)
Alkaline Phosphatase: 110 U/L (ref 40–145)
Anion Gap: 13.1 mMol/L (ref 7.0–18.0)
BUN / Creatinine Ratio: 26.6 Ratio (ref 10.0–30.0)
BUN: 33 mg/dL — ABNORMAL HIGH (ref 7–22)
Bilirubin, Total: 0.7 mg/dL (ref 0.1–1.2)
CO2: 30 mMol/L (ref 20.0–30.0)
Calcium: 9.5 mg/dL (ref 8.5–10.5)
Chloride: 100 mMol/L (ref 98–110)
Creatinine: 1.24 mg/dL — ABNORMAL HIGH (ref 0.60–1.20)
EGFR: 42 mL/min/{1.73_m2}
Globulin: 2.4 gm/dL (ref 2.0–4.0)
Glucose: 105 mg/dL — ABNORMAL HIGH (ref 70–99)
Osmolality Calc: 286 mOsm/kg (ref 275–300)
Potassium: 3.7 mMol/L (ref 3.5–5.3)
Protein, Total: 6 gm/dL (ref 6.0–8.3)
Sodium: 140 mMol/L (ref 136–147)

## 2015-07-17 LAB — CBC AND DIFFERENTIAL
Basophils %: 0.8 % (ref 0.0–3.0)
Basophils Absolute: 0 10*3/uL (ref 0.0–0.3)
Eosinophils %: 2.3 % (ref 0.0–7.0)
Eosinophils Absolute: 0.1 10*3/uL (ref 0.0–0.8)
Hematocrit: 37.9 % (ref 36.0–48.0)
Hemoglobin: 13.4 gm/dL (ref 12.0–16.0)
Lymphocytes Absolute: 1.4 10*3/uL (ref 0.6–5.1)
Lymphocytes: 21.9 % (ref 15.0–46.0)
MCH: 33 pg (ref 28–35)
MCHC: 35 gm/dL (ref 31–36)
MCV: 94 fL (ref 80–100)
MPV: 9 fL (ref 6.0–10.0)
Monocytes Absolute: 0.8 10*3/uL (ref 0.1–1.7)
Monocytes: 12.2 % (ref 3.0–15.0)
Neutrophils %: 64 % (ref 42.0–78.0)
Neutrophils Absolute: 4.3 10*3/uL (ref 1.7–8.6)
PLT CT: 76 10*3/uL — ABNORMAL LOW (ref 130–440)
RBC: 4.05 10*6/uL (ref 3.80–5.00)
RDW: 12.6 % (ref 10.5–14.5)
WBC: 6.5 10*3/uL (ref 4.00–11.00)

## 2015-07-17 LAB — TROPONIN I
Troponin I: 0.03 ng/mL — ABNORMAL HIGH (ref 0.00–0.02)
Troponin I: 0.03 ng/mL — ABNORMAL HIGH (ref 0.00–0.02)

## 2015-07-17 LAB — PT/INR
PT INR: 2.7 — ABNORMAL HIGH (ref 0.8–1.2)
PT: 29.9 s — ABNORMAL HIGH (ref 9.4–12.5)

## 2015-07-17 LAB — IRON PROFILE
% Saturation: 20 % (ref 15–50)
Iron: 96.2 ug/dL (ref 40.0–160.0)
TIBC: 489 ug/dL — ABNORMAL HIGH (ref 250–450)
Transferrin: 349 mg/dL (ref 173.0–360.0)

## 2015-07-17 MED ORDER — CARVEDILOL 6.25 MG PO TABS
12.5000 mg | ORAL_TABLET | Freq: Two times a day (BID) | ORAL | Status: DC
Start: 2015-07-17 — End: 2015-07-19
  Administered 2015-07-17 – 2015-07-19 (×5): 12.5 mg via ORAL
  Filled 2015-07-17 (×5): qty 2

## 2015-07-17 MED ORDER — LISINOPRIL 10 MG PO TABS
10.0000 mg | ORAL_TABLET | Freq: Every day | ORAL | Status: DC
Start: 2015-07-17 — End: 2015-07-17

## 2015-07-17 MED ORDER — FUROSEMIDE 10 MG/ML IJ SOLN
40.0000 mg | Freq: Four times a day (QID) | INTRAMUSCULAR | Status: AC
Start: 2015-07-17 — End: 2015-07-17
  Administered 2015-07-17: 40 mg via INTRAVENOUS
  Filled 2015-07-17: qty 4

## 2015-07-17 MED ORDER — ALBUTEROL SULFATE (2.5 MG/3ML) 0.083% IN NEBU
2.5000 mg | INHALATION_SOLUTION | Freq: Four times a day (QID) | RESPIRATORY_TRACT | Status: DC | PRN
Start: 2015-07-17 — End: 2015-07-19

## 2015-07-17 NOTE — Plan of Care (Signed)
Problem: Psychosocial and Spiritual Needs  Goal: Demonstrates ability to cope with hospitalization/illness  Outcome: Progressing

## 2015-07-17 NOTE — Plan of Care (Signed)
Problem: Safety  Goal: Patient will be free from injury during hospitalization  Outcome: Progressing

## 2015-07-17 NOTE — OT Eval Note (Signed)
[facility]  [address]  [place], [place] [address]    Department of Rehabilitation  [phone]      [name] [facility]    CSN#: [id number]  MEDICAL/SURGICAL 219/219-A    Occupational Therapy Evaluation    Consult received for [name] [facility] for OT Evaluation and Treatment.  Patient's medical condition is appropriate for Occupational therapy intervention at this time.    Time of treatment:   Time Calculation  OT Received On: 07/17/15  Start Time: 1403  Stop Time: 1430  Time Calculation (min): 27 min  Total Treatment Time (min): 25    Visit#: 1    Precautions and Contraindications:   Falls  O2 dependent: baseline of 2L O2 NC    OT Assessment/Clinical Decision Making:      [name] is a 75 y.o. female admitted 07/16/2015 with diagnosis of acute exacerbation of CHF. Prior to admission, pt was residing at home with family. She reports that she was (I) with her ADLs, functional mobility, and driving but received assistance with all IADLs from her daughter. During evaluation, pt completed functional mobility with SBA-S with assist for management of oxygen tank and cueing for pursed lip breathing and coordination of breathing with activity due to pt becoming short of breath. Pt able to complete dressing ADLs with supervision seated at edge of bed, however again was noted to become short of breath. Pt to benefit from short course of skilled OT intervention to ensure safety with bathroom transfers and to educate pt on pursed lip breathing and energy conservation techniques to assist with respiratory management during activity while minimizing activity-related fatigue.     Patient presenting with the following OT impairments:  sensory impairment, decreased activity tolerance, decreased problem solving, decreased functional transfers    Patient will benefit from skilled OT services to ensure safety with bathroom transfers and to educate pt on pursed lip breathing and  energy conservation techniques to assist with respiratory management during activity while minimizing activity-related fatigue    Upon review of the occupational profile and client history, the patient's history was found to be BRIEF.  During assessment of the occupational performance, the patient demonstrated 3 - 5 performance deficits with minimal to moderate modificationsof task or assistance during assessment.  The patient has a limited number of treatment options in order to improve patient's level of function.  After review of the above, patient's degree of complexity is LOW.    Rehabilitation Potential: Good     Discussed risks and benefits and Plan of Care with: Patient    Plan:   Treatment/interventions: functional transfer training, activity pacing, cognition (safety awareness, problem solving, etc.), patient/family training, equipment eval/education, compensatory technique education    Treatment Frequency: OT Frequency Recommended: 2-3x/wk    Goals:   STG=LTGs: Within 2-3 visits:  1. Pt will be mod-(I) with dry tub transfer utilizing DME/AE/AT prn to maximize safety and independence with bathroom transfers. =NEW   2. Pt will demonstrate proper use of pursed-lip breathing mod-(I) with visual aide prn to assist with respiratory management during activity and minimize shortness of breath during self-care routine. =NEW  3. Pt will utilize 3 energy conservation strategies mod-(I) during dressing to assist with minimizing activity-related fatigue during self-care routine. =NEW   4. Pt will be mod-(I) with ADLs and functional transfers/mobility using DME/AE/AT prn to maximize pt's (I) prior to discharge. =NEW  5. Pt will demonstrate good understanding of discharge needs (e.g., DME,  AE, AT, safety techniques, level of assistance with IADLs, etc) to ensure safe and successful transition to home environment. =NEW     DISCHARGE RECOMMENDATIONS   DME recommended for Discharge:   TBD closer to d/c    Discharge  Recommendations:   Other (Comment) (Home with supervision and assistance with IADLs)       Medical & Therapy History:   Medical Diagnosis: Acute on chronic congestive heart failure, unspecified congestive heart failure type [I50.9]    [name] is a 75 y.o. female admitted on 07/16/2015 with acute exacerbation of CHF. For further details, please refer to H&P.    X-Rays/Tests/Labs:  Xr Chest 2 Views    07/16/2015  No significant interval change in the appearance of the chest is identified. There is cardiomegaly and atherosclerosis with aortic dilatation or tortuosity as well as chronic elevation of the left hemidiaphragm. ReadingStation:[id number]    Discussed with patient/family/caregiver the patient's physical, cognitive and/or psychosocial history related to current functional performance: yes    Previous therapy services: Unknown    Patient Active Problem List   Diagnosis   . CHF (congestive heart failure)   . Acute on chronic congestive heart failure, unspecified congestive heart failure type   . Paroxysmal atrial fibrillation   . Hyperkalemia   . Acute on chronic diastolic CHF (congestive heart failure)   . On home oxygen therapy   . Aortic regurgitation   . Pacemaker   . Pacemaker at end of battery life   . Acute exacerbation of CHF (congestive heart failure)        Past Medical/Surgical History:  Past Medical History   Diagnosis Date   . Atrial fibrillation    . Malignant neoplasm    . Pacemaker    . Hypertension    . Aortic aneurysm    . Chronic obstructive pulmonary disease    . Gastroesophageal reflux disease       Past Surgical History   Procedure Laterality Date   . Hysterectomy     . Cardiac pacemaker placement       2002         Occupational Profile:   Home Living Arrangements  Living Arrangements: Family members, pt's daughter and son-in-law reside with her  Assistance Available: Full time   Type of Home: House  Home Layout: One level, Ramped entrance, Basement, Laundry in basement: pt does  not need to access basement  Bathroom:  standard toilet, grab bars around toilet;  tub/shower unit, grab bars in shower, shower chair, however pt reports that there is "not enough room for the chair" and does not use  DME Currently at Home: Shower chair  Single point cane  Front wheeled walker  Rollator (4 wheeled walker)  Home O2    Prior Level of Function  Mobility: Mobility:  Independent with  No assistive device, however will on a rare occasion use her [facility] or rollator when in the community or feeling extremely fatigued  Fall History: Pt denies falls within the past year     Activities of Daily Living  Feeding: Independent  Grooming: Independent  UB dressing: Independent  LB dressing: Independent  Bathing: Independent  Toileting: Independent    Instrumental Activities of Daily Living  Driving & community mobility: Driving: Independent, Only during the day  Patient is dependent for all IADLs at baseline.    Work  Retired    Leisure  "I sit and Haematologist goal for OT: Get home  Patient/caregiver concerns & priorities: Getting home    Subjective:   "Listen, I don't have to do any of that for myself." re: IADLs    Patient is agreeable to participation in the therapy session. Nursing clears patient for therapy.     Pain:  At Rest: 8 /10  With Activity: 8/10  Location: Back:  Lower, Chest    Interventions: RN/LPN aware, pt declined offer for pain medication    ASSESSMENT OF OCCUPATIONAL PERFORMANCE:   Observation of Patient:    Patient is in bed with 2L O2 NC in place.     Skin Inspection: bruise on lower right back     Vital Signs:   BP Sitting: 109/66 mmHg  HR Sitting: 66 bpm  SpO2 at rest: 95% 2L O2 NC     Oriented to: Person, Place  and Situation  Command following: Follows ALL commands and directions without difficulty  Alertness/Arousal: Appropriate responses to stimuli   Attention Span:Appears intact  Memory: Appears intact  Safety Awareness: minimal verbal instruction  Insights: Educated  in Engineer, building services, KELS recommended  Problem Solving: Able to problem solve independently  Behavior: Cooperative  Motor planning: Intact  Coordination: Within Normal Limits (WNL)  Cognitive Deficits: to be further assess prn  Hand dominance: Right handed    Musculoskeletal Examination:   Range of motion:  Right UE: Grossly WFL  Left UE: Grossly WFL       Strength:  Right UE: Grossly WFL  Left UE: Grossly WFL    Tone: Not applicable  Posture: WFL     Sensory/Oculomotor Examination:   Auditory:  impaired:  bilaterally, hearing aide  bilaterally  Tactile-Light Touch:  impaired:  bilaterally and per pt report. To be further evaluated. Pt reports chronic numbness in her fingertips going on 2-years now since a "doctor diagnosed me with Parkinsons"    Activities of Daily Living:   Eating:  Independent; , seated at edge of bed;   Grooming: Supervision, wash/dry hands, standing at sink, with SBA to ensure max-safety and assist with oxygen tank management  UB dressing:  Supervision, Simulated, however pt's BUE AROM WFL for donning pullover shirt  LB dressing: Supervision, don/doff R sock, don/doff L sock, seated at edge of bed, with associated shortness of breath and flushing of cheek color  Bathing:  Supervision;, Simulated, based on pt's AROM screen and participation during LB ADLs  Toileting:  Supervision;, clothing management up, clothing management down, perineal hygiene, raised toilet    AM-PACT "6 Clicks" Daily Activity Inpatient Short Form  Inpatient AM-PACT Performed?: yes  Put On/Take Off Lower Body Clothing: None  Assist with Bathing: None  Assist with Toileting: None  Put On/Take Off Upper Body Clothing: None  Assist with Grooming: None  Assist with Eating: None  OT Daily Activity Raw Score: 24  CMS 0-100% Score: 0.00%    Functional Mobility:   Bed Mobility:  Rolling to Right:   Modified independence .  pillow prop (pt's baseline)  Supine to Sit:   Modified independence .        Sit to Supine:   Modified  independence .        Seated Scooting:   Independent    Transfers:  Sit to Stand:  Independent with No assistive device.        Functional mobility/ambulation: Supervision with No assistive device, assistance with oxygen tank management.      Balance:   Static Sitting:  Good  Dynamic Sitting:  Good  Static Standing:  Good  Dynamic Standing:  Fair+    Participation and Activity Tolerance   Participation effort: Excellent  Activity Tolerance: Tolerates less than of activity with no significant change in vital signs     G-codes:   G-codes applicable (current status: Inpatient): yes - please refer to doc flowsheets for coding   Self Care G Code Set  Self Care, Current Status (J1914): 0 percent impaired, limited or restricted  Self Care, Goal Status (N8295): 0 percent impaired, limited or restricted  Self Care, D/C Status (A2130): 0 percent impaired, limited or restricted     Other Treatment Interventions:   Treatment Activities:   Education on pursed lip breathing           Education Provided:   Topics: Role of occupational therapy, plan of care, goals of therapy and safety with mobility and ADLs, pursed lip breathing, use of adaptive equipment, bed mobility with use of adaptive equipment or strategy.    Individuals educated: Patient.  Method: Explanation.  Response to education: Verbalized understanding and Needs reinforcement.    Team Communication:   OT communicated with: RN/LPN - Annice Pih, CNA Clair Gulling  OT communicated regarding: Patient participation with Therapy, Vital signs, Further recommendations  OT/COTA communication: via written note and verbal communication as needed.    End of session: Left reclined in bed with callbell and phone within reach, bed alarm set, and comfort needs met.    Recommend client (1) complete ADLs from chair with cueing for pacing and (2) complete functional mobility with SBA-S with gait belt and assistance for management of oxygen tank both outside of and in addition to OT  session.    Osker Mason, MS, OTR/L

## 2015-07-17 NOTE — Plan of Care (Signed)
Problem: Safety  Goal: Patient will be free from injury during hospitalization  Outcome: Progressing    Problem: Pain  Goal: Patient's pain/discomfort is manageable  Outcome: Progressing

## 2015-07-17 NOTE — H&P (Signed)
Date: 07/16/2015  REASON FOR ADMISSION: Difficulty breathing.    HISTORY OF PRESENT ILLNESS: [name] is a 75 year old woman  with chronic cardiomegaly and history of congestive heart  failure and hypertension, who presented to the emergency room  last evening complaining of difficulty breathing growing  gradually worse over the last four days.  On admission, she was  seen by Dr. Bing Ree, who turned her over to a PA for admission.  There was some difficulty in organizing admission orders, but  eventually the patient was admitted and after some IV Lasix she  diuresed and felt a bit better.  She says she is short of breath  all the time room to room walking in the home, creates  difficulty breathing.  She has been on supplemental oxygen for  quite some time.  I noticed in a CT scan of her chest done in  March of 2015, she had some ground glass opacities indicative of  interstitial lung disease, which I think was obviously  contributing to her shortness of breath and probably the reason  for the oxygen.  She has had a pacemaker put in and she had an  upgrade of the pacemaker by Dr. Leverne Humbles in November.  She  sees him a couple of times here, but I think it is only for  management of the pacemaker.  She sees Dr. Ludger Nutting as her  PCP and sees him regularly for visits.    MEDICATIONS: Her daily medications include:    1.  Carbidopa-levodopa 25 100 twice a day.  I do not see      that she has Parkinson, this might be for restless legs.  2.  Sodium docusate 100 mg daily.  3.  Eflone ophthalmic drops.  4.  Latanoprost ophthalmic drops.  5.  Oxybutynin 5 mg.  6.  Albuterol nebulizer solution.  7.  Furosemide 40 mg daily.  8.  Warfarin 2.5 mg daily.  9.  Atenolol 100 mg daily.  10.  Benzonatate 200 mg if needed for coughing.  11.  Calcium carbonate.  12.  Clonazepam.  13.  Hydrocodone.  14.  Estradiol.  15.  Cetoconazol.  16.  Omeprazole.  17.  KCl.  18.  Crestor.  19.  50 mg of spironolactone daily.    SOCIAL  HISTORY: She is widowed.  She lives alone.  I actually  took care of her sister, who has severe hypertension and a  brother who had severe hypertension in a congestive heart  failure syndrome before he died.  She has never been a smoker or  drinker.    PHYSICAL EXAMINATION:  GENERAL: Lovely lady, in no distress.  Pleasant demeanor.  Good  facial expression unlike someone with Parkinson.  VITAL SIGNS: Her temp is 97.5, her heart rate is 65,  respirations 18 and comfortable, O2 saturation 95% on 2 L of  oxygen.  Her blood pressure currently is 128/66.  ENT: Look normal.  I do not see distended neck veins today.  There is some HJR.  LUNGS: Her lungs have dry rales in both bases.  HEART: S1-S2.  Rhythm regular.  There seems to be a paradoxical  split of the 2nd heart sound.  ABDOMEN: Her belly is soft and nontender.  EXTREMITIES: She has evidence of chronic venous insufficiency  with hemosiderin pigmentation in the lower extremities and one  healed ulcer on the left lower extremity.  She has chronic  venous insufficiency.  There are no new ulcerations.  She  has  good arterial pulses.    DIAGNOSTIC STUDIES: I looked at an echo done in 2015, she  actually had left ventricular concentric hypertrophy with  abnormal diastolic compliance.  There was an ejection fraction  of about 55%.  Her atria and ventricles were actually normal in  size.  There was an elevation of the right ventricular systolic  pressure to 40.  An echo has not been repeated since then.    LABORATORY DATA: Laboratory studies done last evening include a  white count of 9500, hemoglobin 14.7 g, platelet count was a  little low at 111,000, white cell differential was normal.  Her  BUN and creatinine were 37 and 1.47.  Glucose 124.  Her  electrolytes were normal.  Her liver enzymes were normal.  Her  BNP was 1271.  The troponin was 0.02.  This morning, her labs  are about the same.  The platelet count is dipped to 76,000, but  I see that has been low for quite  some time.  I do not know  whether that has been worked up or not.  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  She has normal  electrolytes this morning.  Her output overnight was 1900 mL and  she does have some symptomatic improvement.    IMPRESSION ON ADMISSION: This is a 75 year old woman with  cardiac enlargement and history of hypertension.  The latest  echo shows abnormal diastolic compliance, mild mitral and aortic  regurgitation with severe tricuspid regurgitation.  She has dry  rales on physical exam and evidence of interstitial fibrosis on  a CT of 2015.  It is tempting to say this woman has congestive  heart failure and certainly she probably has a component of  that, but I have a feeling her problem is more pulmonary with an  interstitial fibrosis and then cardiac in this.  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  Her chest x-ray last evening shows her heart to  be quite enlarged.  I see the atrial and ventricular lead of the  pacemaker in the right side and there is a double shadow on the  right side of the chest x-ray indicating right ventricular and  right atrial enlargement.  The right atrium appears to be quite  big.  This needs to be explained since the echo done in 2015  showed otherwise.  I do not see a lot of fibrotic lung disease,  but there is haziness at the base of the left lung that I do not  think is caused by interstitial fibrosis.  She may have a hiatal  hernia as well, because there is a shadow behind the heart seen  in the lateral film that looks like it could be a hiatal hernia.  Nonetheless, I am going to go ahead and check a cardiac echo  again to she what is going on here.  I think a repeat  high-resolution CT of her lungs may be necessary to further  delineate was going on.  It would be wrong to overtreat her with  diuretics if she does not have much congestive failure.  Certainly overnight, she has  improved with the Lasix and I will  add some more Lasix today.  She will stay for a few days.  I  will get PT and OT to see her and we will try to come to a  better conclusion as to  what is wrong.        I hereby certify this patient for hospitalization based upon   medical necessity as noted above.    [id number]  DD: 07/17/2015 12:35:19  DT: 07/17/2015 14:50:46  JOB: [id number]

## 2015-07-18 ENCOUNTER — Inpatient Hospital Stay: Payer: Medicare Other

## 2015-07-18 DIAGNOSIS — I509 Heart failure, unspecified: Secondary | ICD-10-CM

## 2015-07-18 LAB — BASIC METABOLIC PANEL
Anion Gap: 15.6 mMol/L (ref 7.0–18.0)
BUN / Creatinine Ratio: 25.6 Ratio (ref 10.0–30.0)
BUN: 33 mg/dL — ABNORMAL HIGH (ref 7–22)
CO2: 29 mMol/L (ref 20.0–30.0)
Calcium: 9.9 mg/dL (ref 8.5–10.5)
Chloride: 98 mMol/L (ref 98–110)
Creatinine: 1.29 mg/dL — ABNORMAL HIGH (ref 0.60–1.20)
EGFR: 40 mL/min/{1.73_m2}
Glucose: 95 mg/dL (ref 70–99)
Osmolality Calc: 283 mOsm/kg (ref 275–300)
Potassium: 4.1 mMol/L (ref 3.5–5.3)
Sodium: 138 mMol/L (ref 136–147)

## 2015-07-18 LAB — FERRITIN: Ferritin: 66.73 ng/mL (ref 4.60–204.00)

## 2015-07-18 LAB — B-TYPE NATRIURETIC PEPTIDE: B-Natriuretic Peptide: 996.9 pg/mL — ABNORMAL HIGH (ref 0.0–100.0)

## 2015-07-18 LAB — PT/INR
PT INR: 2.6 — ABNORMAL HIGH (ref 0.8–1.2)
PT: 29.3 s — ABNORMAL HIGH (ref 9.4–12.5)

## 2015-07-18 MED ORDER — NON FORMULARY
1.0000 [drp] | Freq: Three times a day (TID) | Status: DC
Start: 2015-07-18 — End: 2015-07-18
  Administered 2015-07-18: 1 [drp] via OPHTHALMIC

## 2015-07-18 MED ORDER — FUROSEMIDE 10 MG/ML IJ SOLN
40.0000 mg | Freq: Two times a day (BID) | INTRAMUSCULAR | Status: DC
Start: 2015-07-19 — End: 2015-07-19
  Administered 2015-07-19: 40 mg via INTRAVENOUS
  Filled 2015-07-18: qty 4

## 2015-07-18 MED ORDER — FLUOROMETHOLONE 0.1 % OP SUSP
1.0000 [drp] | Freq: Three times a day (TID) | OPHTHALMIC | Status: DC
Start: 2015-07-18 — End: 2015-07-19
  Administered 2015-07-18 – 2015-07-19 (×3): 1 [drp] via OPHTHALMIC

## 2015-07-18 MED ORDER — VH EZ TABLET CRUSHER MISC
Status: AC
Start: 2015-07-18 — End: 2015-07-18
  Filled 2015-07-18: qty 1

## 2015-07-18 NOTE — Plan of Care (Signed)
Problem: Safety  Goal: Patient will be free from injury during hospitalization  Outcome: Progressing

## 2015-07-18 NOTE — Progress Notes (Signed)
[name] had a HRCT of chest today. She has some minor fibrotic changes that are stable but not enough to cause hypoxia. She has an elevated left hemidiaphragm with volume loss on that side. She also has a large 6.2 cm aortic aneurysm. The RA and LA are enlarged.  I do not see pulm edema.   ECG is paced. Background rhythm may be sinus w AV dissociation.   Results     Procedure Component Value Units Date/Time    Ferritin [[id number]] Collected:  07/18/15 0525    Specimen Information:  Plasma Updated:  07/18/15 1316    B-type Natriuretic Peptide [[id number]]  (Abnormal) Collected:  07/18/15 0525    Specimen Information:  Blood Updated:  07/18/15 0622     B-Natriuretic Peptide 996.9 (H) pg/mL     Basic Metabolic Panel [[id number]]  (Abnormal) Collected:  07/18/15 0525    Specimen Information:  Plasma Updated:  07/18/15 0603     Sodium 138 mMol/L      Potassium 4.1 mMol/L      Chloride 98 mMol/L      CO2 29.0 mMol/L      Calcium 9.9 mg/dL      Glucose 95 mg/dL      Creatinine 3.29 (H) mg/dL      BUN 33 (H) mg/dL      Anion Gap 51.8 mMol/L      BUN/Creatinine Ratio 25.6 Ratio      EGFR 40 mL/min/1.71m2      Osmolality Calc 283 mOsm/kg     Daily PT/INR [[id number]]  (Abnormal) Collected:  07/18/15 0525    Specimen Information:  Blood Updated:  07/18/15 0559     PT 29.3 (H) sec      PT INR 2.6 (H)     Troponin I [[id number]]  (Abnormal) Collected:  07/17/15 1641    Specimen Information:  Plasma Updated:  07/17/15 1706     Troponin I 0.03 (H) ng/mL     IRON PROFILE [[id number]]  (Abnormal) Collected:  07/17/15 1250    Specimen Information:  Plasma Updated:  07/17/15 1602     Iron 96.2 mcg/dL      Transferrin 427.0 mg/dL      TIBC 623 (H) ug/dL      % Saturation 20 %         BNP remains markedly elevated which suggests heart filure. She is therapeutically anticoagulated which makes PE unlikely. Her dyspnea is chronic and she does run out of air while talking.   I will have PT and OT see her tomorrow and I will get another ECHO her  this week.

## 2015-07-18 NOTE — Plan of Care (Signed)
Problem: Psychosocial and Spiritual Needs  Goal: Demonstrates ability to cope with hospitalization/illness  Outcome: Progressing

## 2015-07-18 NOTE — Plan of Care (Signed)
Problem: Safety  Goal: Patient will be free from injury during hospitalization  Intervention: Assess patient's risk for falls and implement fall prevention plan of care per policy  Patient will not fall during hospitalization. Bed alarm set, hourly rounding performed.

## 2015-07-19 DIAGNOSIS — Q791 Other congenital malformations of diaphragm: Secondary | ICD-10-CM

## 2015-07-19 DIAGNOSIS — J849 Interstitial pulmonary disease, unspecified: Secondary | ICD-10-CM | POA: Diagnosis present

## 2015-07-19 DIAGNOSIS — I712 Thoracic aortic aneurysm, without rupture, unspecified: Secondary | ICD-10-CM | POA: Diagnosis present

## 2015-07-19 LAB — BASIC METABOLIC PANEL
Anion Gap: 12 mMol/L (ref 7.0–18.0)
BUN / Creatinine Ratio: 27.9 Ratio (ref 10.0–30.0)
BUN: 31 mg/dL — ABNORMAL HIGH (ref 7–22)
CO2: 29 mMol/L (ref 20.0–30.0)
Calcium: 9.5 mg/dL (ref 8.5–10.5)
Chloride: 101 mMol/L (ref 98–110)
Creatinine: 1.11 mg/dL (ref 0.60–1.20)
EGFR: 48 mL/min/{1.73_m2}
Glucose: 91 mg/dL (ref 70–99)
Osmolality Calc: 281 mOsm/kg (ref 275–300)
Potassium: 4.1 mMol/L (ref 3.5–5.3)
Sodium: 138 mMol/L (ref 136–147)

## 2015-07-19 LAB — ECG 12-LEAD
P-R Interval: 0 ms
Patient Age: 75 years
Q-T Interval(Corrected): 525 ms
Q-T Interval: 525 ms
QRS Axis: -88 deg
QRS Duration: 188 ms
T Axis: 105 years
Ventricular Rate: 60 //min

## 2015-07-19 LAB — B-TYPE NATRIURETIC PEPTIDE: B-Natriuretic Peptide: 1171.3 pg/mL — ABNORMAL HIGH (ref 0.0–100.0)

## 2015-07-19 LAB — PT/INR
PT INR: 2.5 — ABNORMAL HIGH (ref 0.8–1.2)
PT: 27.8 s — ABNORMAL HIGH (ref 9.4–12.5)

## 2015-07-19 MED ORDER — FUROSEMIDE 40 MG PO TABS
40.0000 mg | ORAL_TABLET | Freq: Two times a day (BID) | ORAL | Status: DC
Start: 2015-07-19 — End: 2016-10-11
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
## 2015-07-19 NOTE — PT Eval Note (Signed)
[facility]    [facility]  [address]  [place], [place] [address]    Department of Rehabilitation  [phone]    Aniela Caniglia [facility]    CSN: [id number]    MEDICAL/SURGICAL   219/219-A    Physical Therapy Evaluation    Time of treatment:  Time Calculation  PT Received On: 07/19/15  Start Time: 4332  Stop Time: 1015  Time Calculation (min): 21 min    Visit#:   1                                                                                Precautions and Contraindications:   NONE    Clinical Presentation and Decision Making     PT Assessment:  [name] was admitted 07/16/2015 with difficulty breathing and diagnosis on CHF. Pt reports feeling much better today and feels she is back to her baseline in terms of mobility. She was able to rise from bed and ambulate 140' in the hallway with no device independently with a continuous and steady gait pattern. SpO2 96% on 2L. No further skilled inpatient PT needs. Recommend pt ambulate in hallway several times/day with assistance from nursing staff for O2 tank to maintain strength/mobility.     Patient presenting with the following PT Impairments:Appears to be at baseline for mobility    Due to the presence of a limited number of treatment options and 1-2 comorbidities or personal factors that affect performance, as well as patient's stable and/or uncomplicated characteristics, modifications were NOT necessary to complete evaluation when examining 1-2 elements (includes body structures and functions, activity limitations and/or participation restrictions) determines the degree of complexity for this patient is LOW    Rehabilitation Potential:Not a PT candidate    Discussed risk, benefits and Plan of Care with: Patient    DISCHARGE RECOMMENDATIONS   DME recommended for Discharge:   None    Discharge Recommendations:     None    Development of Plan of Care:     Goals:    N/A  Treatment/interventions: No skilled acute care interventions  needed at this time.    Treatment Frequency:  N/A    History:   History of Present Illness:    Medical Diagnosis: Acute on chronic congestive heart failure, unspecified congestive heart failure type [I50.9]    [name] is a 75 y.o. female admitted on 07/16/2015 with CHF.     Patient Active Problem List   Diagnosis   . CHF (congestive heart failure)   . Acute on chronic congestive heart failure, unspecified congestive heart failure type   . Paroxysmal atrial fibrillation   . Hyperkalemia   . Acute on chronic diastolic CHF (congestive heart failure)   . On home oxygen therapy   . Aortic regurgitation   . Pacemaker   . Pacemaker at end of battery life   . Acute exacerbation of CHF (congestive heart failure)        X-Rays/Tests/Labs:  Xr Chest 2 Views    07/16/2015  No significant interval change in the appearance of the chest is identified. There is cardiomegaly and atherosclerosis with aortic dilatation or tortuosity as well as  chronic elevation of the left hemidiaphragm. ReadingStation:[id number]    Ct Chest High Resolution    07/18/2015  The multiple small pulmonary nodules are not appreciably changed since the 2015 CT scan. This suggests stable disease, with the differential diagnosis being given above. The 6.1 cm ascending thoracic aortic aneurysm is not appreciably changed. Vascular surgery consultation may be indicated. There is a mild increase in the size of the aneurysmal dilation of the proximal descending thoracic aorta, now measuring 4.9 cm. Multiple other incidental findings are detailed above. Trans: jdw ReadingStation:[id number]        Past Medical/Surgical History:  Past Medical History   Diagnosis Date   . Atrial fibrillation    . Malignant neoplasm    . Pacemaker    . Hypertension    . Aortic aneurysm    . Chronic obstructive pulmonary disease    . Gastroesophageal reflux disease       Past Surgical History   Procedure Laterality Date   . Hysterectomy     . Cardiac pacemaker placement        2002         Social History:    Home Living Arrangements:  Living Arrangements: Family members  Assistance Available: Part time  Type of Home: House  Home Layout: with no stairs to enter, Able to live on main level with bedroom and bathroom    Prior Level of Function:  Household ambulation  Mobility:  Independent with  No assistive device  Fall history: no    DME available at home:  Front wheeled walker  Single point cane   HOme O2- 2L    Subjective   "I think I'll probably go home today. I'm feeling much better."    Patient is agreeable to participation in the therapy session. Nursing clears patient for therapy.     Pain:  At Rest: 7/10  With Activity: 7/10  Location: Back:  Lower  Interventions: Medication (see eMAR)    Examination of Body Systems (Structures, Function, Activity and Participation)   Patient's medical condition is appropriate for Physical therapy intervention at this time    Observation of patient:  Patient is in bed with peripheral IV, O2 at 2 liters/minute via nasal cannula in place.    Cognition:  Command following: Follows ALL commands and directions without difficulty    Vital Signs (Cardiovascular):  BP Standing: 110/73 mmHg  HR Standing 101 bpm  SpO2 at rest: 96%  SpO2 with activity: 96-98%  on supplemental O2            Balance:  Static Sitting:  Good  Dynamic Sitting:  Good  Static Standing:  Good  Dynamic Standing:  Good           Musculoskeletal Examination:            Range of motion:  WFL BLEs       Strength:  BLEs WFLs         Functional Mobility:    Bed Mobility:  Supine to Sit:   Independent.        Sit to Supine:   Independent.          Transfers:  Sit to Stand:  Independent with No assistive device.         Stand to Sit:  Independent.           Locomotion:  LEVEL AMBULATION:  Distance: 140'   Assistance level:  Independent -Supervision (supervision for SpO2 monitoring and assistance with O2  tank in hospital setting)   Device:  No assistive device  Pattern:  WFL = Within functional  limits, Decreased cadence    AM-PACT "6 Clicks" Basic Mobility Inpatient Short Form  Turning Over in Bed: None  Sitting Down On/Standing From Armchair: None  Lying on Back to Sitting on Side of Bed: None  Assist Moving to/from Bed to Chair: None  Assist to Walk in Hospital Room: None  Assist to Climb 3-5 Steps with Railing: None  PT Basic Mobility Raw Score: 24  CMS 0-100% Score: 0.00% Mobility G Code Set  Mobility, Current Status (U0454): 0 percent impaired, limited or restricted  Mobility, Goal Status (U9811): 0 percent impaired, limited or restricted  Mobility, D/C Status (B1478): 0 percent impaired, limited or restricted  Tools used to determine level of impairment: AM-PACT "6 Clicks" Basic Mobility Score               Participation and Activity Tolerance:  Participation effort: Good  Activity Tolerance: Tolerates 10-20 minutes of activity without rest breaks      G-codes:   G-codes applicable (current status: Inpatient): yes - please refer to doc flowsheets for coding     Treatment Interventions this session:   Evaluation    Education Provided:   TOPICS: role of physical therapy.     Learner educated: Patient  Method: Explanation  Response to education: Verbalized understanding    Patient Position at End of Treatment:   Sitting, at the edge of the bed, Needs in reach and No distress    Team Communication:     Spoke to: RN/LPN - Crystal  Regarding: Patient participation with Therapy  Whiteboard updated: Yes    Alvino Chapel, DPT

## 2015-07-19 NOTE — OT Progress Note (Signed)
[facility]  [address]  [place], [place] [address]    Department of Rehabilitation  [phone]      Cristalle Rohm [facility]    CSN#: [id number]  MEDICAL/SURGICAL 219/219-A    Occupational Therapy Treatment Note    Consult received for [name] [facility] for OT Evaluation and Treatment.  Patient's medical condition is appropriate for Occupational therapy intervention at this time.    Time of treatment:   Time Calculation  OT Received On: 07/19/15  Start Time: 1145  Stop Time: 1216  Time Calculation (min): 31 min  Total Treatment Time (min): 30    Visit#: 2    Precautions and Contraindications:   Falls  O2 dependent: baseline of 2L O2 NC    OT Assessment/Clinical Decision Making:      [name] is a 75 y.o. female admitted 07/16/2015 with diagnosis of acute exacerbation of CHF. Pt seen per POC and at this time has met all goals and is adequate for safe discharge to home environment planned for later this afternoon. Currently, pt is mod-(I) with bed and bathroom transfers and is independent with functional mobility without unsteadiness or LOB while maintaining SpO2 within appropriate range on 2L O2 NC. Pt receptive to education on pursed-lip breathing, energy conservation strategies, and diaphragmatic breathing, return demonstrating appropriately with use of educational handouts. Pt receptive to recommendation for 1x visit from home health occupational therapist to assess pt's home environment and safety to ensure successful transition to living environment.     Plan:   Treatment/interventions: functional transfer training, activity pacing, cognition (safety awareness, problem solving, etc.), patient/family training, equipment eval/education, compensatory technique education    Treatment Frequency:  2-3x/week    Goals:   STG=LTGs: Within 2-3 visits:  1. Pt will be mod-(I) with dry tub transfer utilizing DME/AE/AT prn to maximize safety and independence with bathroom  transfers. =MET  2. Pt will demonstrate proper use of pursed-lip breathing mod-(I) with visual aide prn to assist with respiratory management during activity and minimize shortness of breath during self-care routine. =MET  3. Pt will utilize 3 energy conservation strategies mod-(I) during dressing to assist with minimizing activity-related fatigue during self-care routine. =MET  4. Pt will be mod-(I) with ADLs and functional transfers/mobility using DME/AE/AT prn to maximize pt's (I) prior to discharge. =MET  5. Pt will demonstrate good understanding of discharge needs (e.g., DME, AE, AT, safety techniques, level of assistance with IADLs, etc) to ensure safe and successful transition to home environment. =MET    DISCHARGE RECOMMENDATIONS   DME recommended for Discharge:   Has needed equipment    Discharge Recommendations:   Other (Comment);Home with supervision;Home with home health OT (and assistance with IADLs (pt's baseline))       Medical & Therapy History:   Medical Diagnosis: Acute on chronic congestive heart failure, unspecified congestive heart failure type [I50.9]    [name] is a 76 y.o. female admitted on 07/16/2015 with acute exacerbation of CHF. For further details, please refer to H&P.    X-Rays/Tests/Labs:  Xr Chest 2 Views    07/16/2015  No significant interval change in the appearance of the chest is identified. There is cardiomegaly and atherosclerosis with aortic dilatation or tortuosity as well as chronic elevation of the left hemidiaphragm. ReadingStation:[id number]    Ct Chest High Resolution    07/18/2015  The multiple small pulmonary nodules are not appreciably changed since the 2015 CT scan. This suggests stable  disease, with the differential diagnosis being given above. The 6.1 cm ascending thoracic aortic aneurysm is not appreciably changed. Vascular surgery consultation may be indicated. There is a mild increase in the size of the aneurysmal dilation of the proximal descending  thoracic aorta, now measuring 4.9 cm. Multiple other incidental findings are detailed above. Trans: jdw ReadingStation:[id number]    Discussed with patient/family/caregiver the patient's physical, cognitive and/or psychosocial history related to current functional performance: yes    Previous therapy services: Unknown    Patient Active Problem List   Diagnosis   . CHF (congestive heart failure)   . Acute on chronic congestive heart failure, unspecified congestive heart failure type   . Paroxysmal atrial fibrillation   . Hyperkalemia   . Acute on chronic diastolic CHF (congestive heart failure)   . On home oxygen therapy   . Aortic regurgitation   . Pacemaker   . Pacemaker at end of battery life   . Acute exacerbation of CHF (congestive heart failure)   . Diaphragmatic eventration   . Chronic interstitial lung disease   . Thoracic aortic aneurysm without rupture        Past Medical/Surgical History:  Past Medical History   Diagnosis Date   . Atrial fibrillation    . Malignant neoplasm    . Pacemaker    . Hypertension    . Aortic aneurysm    . Chronic obstructive pulmonary disease    . Gastroesophageal reflux disease       Past Surgical History   Procedure Laterality Date   . Hysterectomy     . Cardiac pacemaker placement       2002         Occupational Profile:   Home Living Arrangements  Living Arrangements: Family members, pt's daughter and son-in-law reside with her  Assistance Available: Full time   Type of Home: House  Home Layout: One level, Ramped entrance, Basement, Laundry in basement: pt does not need to access basement  Bathroom:  standard toilet, grab bars around toilet;  tub/shower unit, grab bars in shower, shower chair, however pt reports that there is "not enough room for the chair" and does not use  DME Currently at Home: Shower chair  Single point cane  Front wheeled walker  Rollator (4 wheeled walker)  Home O2    Prior Level of Function  Mobility: Mobility:  Independent with  No assistive device,  however will on a rare occasion use her [facility] or rollator when in the community or feeling extremely fatigued  Fall History: Pt denies falls within the past year     Activities of Daily Living  Feeding: Independent  Grooming: Independent  UB dressing: Independent  LB dressing: Independent  Bathing: Independent  Toileting: Independent    Instrumental Activities of Daily Living  Driving & community mobility: Driving: Independent, Only during the day  Patient is dependent for all IADLs at baseline.    Work  Retired    Leisure  "I sit and Haematologist goal for OT: Get home    Patient/caregiver concerns & priorities: Getting home    Subjective:   "I don't want help, I want to still do things for myself." re: HHOT. Educated pt on role and goal of HHOT with pt verbalizing receptiveness to 1x visit following education.     Patient is agreeable to participation in the therapy session. Nursing clears patient for therapy.     Pain: Pt without complaint of pain.  ASSESSMENT OF OCCUPATIONAL PERFORMANCE:   Observation of Patient:    Patient is in bed with 2L O2 NC in place.     Skin Inspection: bruise on lower right back     Vital Signs:   SpO2 following activity: 98% 2L O2 NC     Oriented to: Person, Place  and Situation  Command following: Follows ALL commands and directions without difficulty  Alertness/Arousal: Appropriate responses to stimuli   Attention Span:Appears intact  Memory: Appears intact  Safety Awareness: minimal verbal instruction  Insights: Educated in Engineer, building services, KELS recommended  Problem Solving: Able to problem solve independently  Behavior: Cooperative  Motor planning: Intact  Coordination: Within Normal Limits (WNL)  Cognitive Deficits: to be further assess prn  Hand dominance: Right handed    Musculoskeletal Examination:   Range of motion:  Right UE: Grossly WFL  Left UE: Grossly WFL       Strength:  Right UE: Grossly WFL  Left UE: Grossly WFL    Tone: Not applicable  Posture: WFL      Sensory/Oculomotor Examination:   Auditory:  impaired:  bilaterally, hearing aide  bilaterally  Tactile-Light Touch:  impaired:  bilaterally and per pt report. To be further evaluated. Pt reports chronic numbness in her fingertips going on 2-years now since a "doctor diagnosed me with Parkinsons"    Activities of Daily Living:   Eating:  Independent; , seated at edge of bed;   Grooming: Independent, wash/dry hands, standing at sink  UB dressing:  Independent, pullover shirt, seated at edge of bed   LB dressing: Independent, don/doff R sock, don/doff L sock, seated at edge of bed  Bathing:  Modified Independent with assistive device;, Simulated, based on pt's AROM screen and participation during LB ADLs  Toileting:  Independent;, clothing management up, clothing management down, perineal hygiene, standard toilet     Functional Mobility:   Bed Mobility:  Rolling to Right:   Modified independence .  pillow prop (pt's baseline)  Supine to Sit:   Modified independence .        Sit to Supine:   Modified independence .        Seated Scooting:   Independent    Transfers:  Sit to Stand:  Independent with No assistive device.        Functional mobility/ambulation: Independent with No assistive device.      Balance:   Static Sitting:  Good  Dynamic Sitting:  Good  Static Standing:  Good  Dynamic Standing:  WFL    Participation and Activity Tolerance   Participation effort: Excellent  Activity Tolerance: Tolerates 10-20 minutes of activity with multiple rests     G-codes:   G-codes applicable (current status: Inpatient): yes - please refer to doc flowsheets for coding      Other Treatment Interventions:   Treatment Activities:   Education on pursed lip breathing, energy conservation, diaphragmatic breathing, coordination of breathing with activity, role and goal of home health OT           Education Provided:   Topics: Role of occupational therapy, plan of care, goals of therapy and safety with mobility and ADLs, pursed lip  breathing, use of adaptive equipment, bed mobility with use of adaptive equipment or strategy, energy conservation, diaphragmatic breathing, coordination of breathing with activity, role and goal of home health OT, discharge recommendations and home safety    Individuals educated: Patient.  Method: Explanation.  Response to education: Verbalized understanding.    Team Communication:  OT communicated with: RN/LPN Annice Pih, CNA Clair Gulling, PTVernona Rieger  OT communicated regarding: Discharge needs, Patient participation with Therapy, Vital signs, Further recommendations  OT/COTA communication: via written note and verbal communication as needed.    End of session: Left seated at edge of bed with callbell and phone within reach, bed alarm set, lunch tray present, and family member present at bedside.     Recommend client (1) complete ADLs from chair with cueing for pacing and (2) complete functional mobility to bathroom for toileting both outside of and in addition to OT session.    Osker Mason, MS, OTR/L

## 2015-07-19 NOTE — Progress Notes (Signed)
Discharge instructions and medications reviewed w/ Pt. Pt verbalizes understanding but voices concerns over differences in how her warfarin is ordered and will defer to her PCP when she follows up next Monday. Otherwise she voices no questions or concerns. Pt discharged to homw.

## 2015-07-19 NOTE — Discharge Instructions (Signed)
Watch salt intake.     Wear oxygen all the time and ask your oxygen supplier if you can have something easier for portable.     Follow w Dr. Fransisca Kaufmann within two weeks and explain your home situation w him.

## 2015-07-19 NOTE — Plan of Care (Signed)
Plan of care adequate for discharge home.

## 2015-07-19 NOTE — Plan of Care (Signed)
Problem: Safety  Goal: Patient will be free from injury during hospitalization  Outcome: Progressing

## 2015-07-19 NOTE — Progress Notes (Signed)
75 year old female admitted on 07/16/15 with a diagnosis of Congestive Heart Failure.  Met with patient this morning to discuss discharge planning.  Patient states that prior to this hospitalization she lived at home and her daughter and son in law lives with her.  Patient states that prior to this illness she remained fairly independent.  Patient states that she is able to perform her own personal care and occasionally drives short distances.  Patient states that her daughter does the cooking, household chores, and grocery shopping.  Patient states that from "time to time" she uses a cane to assist with ambulation needs.  Patient also mentions that she has a shower chair, grab bars in the bathroom, a walker, and a Rollator at home that can be used if needed.  Patient also mentions that she uses home oxygen at 2 liters and receives her oxygen needs from [facility].  Patient denies any concerns with their services.  We have discussed patients prescription coverage and patient denies any financial concerns with the cost of her medications.  Patient is noted to have Medicare and [employer].  Patient states that she obtains her mediations from [facility].  We have discussed the potential need for home health services related to patients CHF diagnosis and with several attempts patient denies wanting/needing home health services at time of discharge.  Patient states that her daughter or son in law will provide transportation home via private vehicle at time of discharge.  At present, patient voices no financial concerns, no prescription coverage concerns, or any discharge needs or services.  However, case management will continue to monitor for interventions that may arise until discharge time.        07/19/15 1052   Patient Type   Within 30 Days of Previous Admission? No   Medicare focused diagnosis patient? CHF   Bundle patient? Not a bundle patient   Healthcare Decisions   Interviewed: Patient   Orientation/Decision  Making Abilities of Patient Alert and Oriented x3, able to make decisions   Prior to admission   Prior level of function Independent with ADLs   Type of Residence Private residence   Have running water, electricity, heat, etc? Yes   Living Arrangements Children   Dressing Independent   Grooming Independent   Feeding Independent   Bathing Independent   Toileting Independent   DME Currently at Home Single point cane;Front wheel walker;Four wheel walker;Home O2;Grab bars;Other (Comment)  (shower chair)   Adult Protective Services (APS) involved? No   Discharge Planning   Support Systems Children;Family members;Friends/neighbors   Anticipated [place] plan discussed with: Patient   Mode of transportation: Private car (family member)   Consults/Providers   PT Evaluation Needed 1   OT Evalulation Needed 1   SLP Evaluation Needed 2

## 2015-07-19 NOTE — UM Notes (Signed)
[facility] Utilization Management Review Sheet    NAME: [name]  MR#: [id number]    CSN#: [id number]    ROOM: 219/219-A AGE: 75 y.o.    START OF CARE DATE AND TIME: 07/16/2015  3:53 PM  ADMIT DATE AND TIME: 07/16/15 @ 1946(CHANGED TO INPATIENT 07/17/15 @ 1249)    PATIENT CLASS:INPATIENT    ATTENDING PHYSICIAN: Graciela Husbands, DO  PAYOR:Payor: MEDICARE / Plan: MEDICARE PART A AND B / Product Type: *No Product type* /       AUTH #: [employer] MEDICARE    DIAGNOSIS: Active Problems:    Acute exacerbation of CHF (congestive heart failure)      ICD-10-CM    1. Acute on chronic congestive heart failure, unspecified congestive heart failure type I50.9        HISTORY:   Past Medical History   Diagnosis Date   . Atrial fibrillation    . Malignant neoplasm    . Pacemaker    . Hypertension    . Aortic aneurysm    . Chronic obstructive pulmonary disease    . Gastroesophageal reflux disease        DATE OF REVIEW: 07/19/2015    VITALS: BP 137/76 mmHg  Pulse 87  Temp(Src) 97.6 F (36.4 C) (Oral)  Resp 17  Ht 1.6 m (5\' 3" )  Wt 76.34 kg (168 lb 4.8 oz)  BMI 29.82 kg/m2  SpO2 100%    ER/UR NOTE 07/16/15:     75 yo female who presents to the ER with complaints of shortness of breath, worsening over the past week. Patient has been unable to sleep- positive orthopnea. Patient has a history of interstitial lung disease and is on chronic oxygen therapy. While in the ER, patient received IV Lasix 80mg . On assessment, lung sounds with rales in bases. Patient with need for continued evaluation, medication additions/adjustment.     VITALS: T 96.3, HR 60, R 22, BP 144/67, SATS 96%    DIAGNOSTICS: WBC 9.5, H/H 14.7/43.4; TROP 0.02; GLU 124, BUN 37, CREAT 1.47; BNP 1271.6; PT/INR 31/2.8; CXR No significant interval change in the appearance of the chest is identified. There is cardiomegaly and atherosclerosis with aortic dilatation or tortuosity as well as chronic elevation of the left hemidiaphragm.    ORDERS: OBSERVATION, FULL CODE, INT, IV  LASIX 80MG  IN ER THEN 60MG  PO DAILY, ALBUTEROL NEBS PRN, COUMADIN THERAPY WITH DAILY INR, ALDACTONE 50MG  PO DAILY, CBC/CMP IN AM, KCL DAILY, WEIGH DAILY, VS EVERY 8 HOURS    UR NOTE 07/17/15:     CHANGED TO INPATIENT STATUS 1249PM    Patient continues with dyspnea but has improved some overnight with diuresis of urine. On assessment, patient with rales in bases. Will add IV Lasix to medication regimen and order CT to assess lungs more closely.     VITALS: T 97.7, HR 62, R 18,B P 114/64, SATS 98%    DIAGNOSTICS: PT/INR 29.9/2.7; GLU 105, BUN 33, CREAT1.24; WBC 6.5, H/H 13.4/37.9; TROP 0.03    ORDERS: INPATIENT, FULL CODE, IV LASIX 40MG  EVERY 6 HOURS, PT/OT EVAL AND TREAT,  ALBUTEROL NEBS PRN, COUMADIN THERAPY WITH DAILY INR, ALDACTONE 50MG  PO DAILY, BNP/BMP IN AM, KCL DAILY, WEIGH DAILY, VS EVERY 8 HOURS, EKG, CT CHEST, ECHO    MCG 20TH EDITION MG-PUL/M-190 HEART FAILURE IN A PATIENT WITH INTERSTITIAL LUNG DISEASE. PATIENT UNABLE TO SLEEP, AMBULATE SHORT DISTANCES R/T DYSPNEA/ORTHOPNEA. PATIENT WITH NEED FOR CONTINUED EVALUATION, IV DIURETICS > 24 HOURS

## 2015-10-18 ENCOUNTER — Other Ambulatory Visit
Admission: RE | Admit: 2015-10-18 | Discharge: 2015-10-18 | Disposition: A | Discharge status: Home / Self Care | Payer: Medicare Other | Source: Ambulatory Visit | Attending: Internal Medicine | Admitting: Internal Medicine

## 2015-10-18 LAB — CBC AND DIFFERENTIAL
Basophils %: 0.2 % (ref 0.0–3.0)
Basophils Absolute: 0 10*3/uL (ref 0.0–0.3)
Eosinophils %: 1.9 % (ref 0.0–7.0)
Eosinophils Absolute: 0.1 10*3/uL (ref 0.0–0.8)
Hematocrit: 40.7 % (ref 36.0–48.0)
Hemoglobin: 12.5 gm/dL (ref 12.0–16.0)
Lymphocytes Absolute: 1.4 10*3/uL (ref 0.6–5.1)
Lymphocytes: 22.2 % (ref 15.0–46.0)
MCH: 31 pg (ref 28–35)
MCHC: 31 gm/dL — ABNORMAL LOW (ref 32–36)
MCV: 100 fL (ref 80–100)
MPV: 9.2 fL (ref 6.0–10.0)
Monocytes Absolute: 0.7 10*3/uL (ref 0.1–1.7)
Monocytes: 11.3 % (ref 3.0–15.0)
Neutrophils %: 64.4 % (ref 42.0–78.0)
Neutrophils Absolute: 4.2 10*3/uL (ref 1.7–8.6)
PLT CT: 104 10*3/uL — ABNORMAL LOW (ref 130–440)
RBC: 4.05 10*6/uL (ref 3.80–5.00)
RDW: 13.3 % (ref 11.0–14.0)
WBC: 6.5 10*3/uL (ref 4.0–11.0)

## 2015-10-18 LAB — COMPREHENSIVE METABOLIC PANEL
ALT: 16 U/L (ref 0–55)
AST (SGOT): 21 U/L (ref 10–42)
Albumin/Globulin Ratio: 1.71 Ratio — ABNORMAL HIGH (ref 0.70–1.50)
Albumin: 4.1 gm/dL (ref 3.5–5.0)
Alkaline Phosphatase: 130 U/L (ref 40–145)
Anion Gap: 11.5 mMol/L (ref 7.0–18.0)
BUN / Creatinine Ratio: 30.8 Ratio — ABNORMAL HIGH (ref 10.0–30.0)
BUN: 33 mg/dL — ABNORMAL HIGH (ref 7–22)
Bilirubin, Total: 0.8 mg/dL (ref 0.1–1.2)
CO2: 29.7 mMol/L (ref 20.0–30.0)
Calcium: 10.3 mg/dL (ref 8.5–10.5)
Chloride: 104 mMol/L (ref 98–110)
Creatinine: 1.07 mg/dL (ref 0.60–1.20)
EGFR: 50 mL/min/{1.73_m2} — ABNORMAL LOW (ref 60–150)
Globulin: 2.4 gm/dL (ref 2.0–4.0)
Glucose: 101 mg/dL — ABNORMAL HIGH (ref 70–99)
Osmolality Calc: 289 mOsm/kg (ref 275–300)
Potassium: 4.2 mMol/L (ref 3.5–5.3)
Protein, Total: 6.5 gm/dL (ref 6.0–8.3)
Sodium: 141 mMol/L (ref 136–147)

## 2015-10-18 LAB — LIPID PANEL
Cholesterol: 140 mg/dL (ref 75–199)
Coronary Heart Disease Risk: 2.92
HDL: 48 mg/dL (ref 45–65)
LDL Calculated: 77 mg/dL
Triglycerides: 76 mg/dL (ref 10–150)
VLDL: 15 (ref 0–40)

## 2015-10-18 LAB — PT/INR
PT INR: 2 — ABNORMAL HIGH (ref 0.5–1.3)
PT: 22 s — ABNORMAL HIGH (ref 9.5–11.5)

## 2016-02-11 ENCOUNTER — Other Ambulatory Visit
Admission: RE | Admit: 2016-02-11 | Discharge: 2016-02-11 | Disposition: A | Discharge status: Home / Self Care | Payer: Medicare Other | Source: Ambulatory Visit | Attending: Internal Medicine | Admitting: Internal Medicine

## 2016-02-11 LAB — COMPREHENSIVE METABOLIC PANEL
ALT: 27 U/L (ref 0–55)
AST (SGOT): 32 U/L (ref 10–42)
Albumin/Globulin Ratio: 1.48 Ratio (ref 0.70–1.50)
Albumin: 4 gm/dL (ref 3.5–5.0)
Alkaline Phosphatase: 151 U/L — ABNORMAL HIGH (ref 40–145)
Anion Gap: 14.2 mMol/L (ref 7.0–18.0)
BUN / Creatinine Ratio: 34.4 Ratio — ABNORMAL HIGH (ref 10.0–30.0)
BUN: 33 mg/dL — ABNORMAL HIGH (ref 7–22)
Bilirubin, Total: 0.8 mg/dL (ref 0.1–1.2)
CO2: 28.1 mMol/L (ref 20.0–30.0)
Calcium: 10.4 mg/dL (ref 8.5–10.5)
Chloride: 100 mMol/L (ref 98–110)
Creatinine: 0.96 mg/dL (ref 0.60–1.20)
EGFR: 57 mL/min/{1.73_m2} — ABNORMAL LOW (ref 60–150)
Globulin: 2.7 gm/dL (ref 2.0–4.0)
Glucose: 98 mg/dL (ref 70–99)
Osmolality Calc: 283 mOsm/kg (ref 275–300)
Potassium: 4.3 mMol/L (ref 3.5–5.3)
Protein, Total: 6.7 gm/dL (ref 6.0–8.3)
Sodium: 138 mMol/L (ref 136–147)

## 2016-02-11 LAB — LIPID PANEL
Cholesterol: 154 mg/dL (ref 75–199)
Coronary Heart Disease Risk: 2.91
HDL: 53 mg/dL (ref 45–65)
LDL Calculated: 85 mg/dL
Triglycerides: 79 mg/dL (ref 10–150)
VLDL: 16 (ref 0–40)

## 2016-02-17 ENCOUNTER — Other Ambulatory Visit
Admission: RE | Admit: 2016-02-17 | Discharge: 2016-02-17 | Disposition: A | Discharge status: Home / Self Care | Payer: Medicare Other | Source: Ambulatory Visit | Attending: Internal Medicine | Admitting: Internal Medicine

## 2016-02-17 LAB — PT/INR
PT INR: 1.5 — ABNORMAL HIGH (ref 0.5–1.3)
PT: 15.3 s — ABNORMAL HIGH (ref 9.5–11.5)

## 2016-03-07 ENCOUNTER — Encounter: Payer: Self-pay | Admitting: Interventional Cardiology

## 2016-04-11 ENCOUNTER — Other Ambulatory Visit
Admission: RE | Admit: 2016-04-11 | Discharge: 2016-04-11 | Disposition: A | Discharge status: Home / Self Care | Payer: Medicare Other | Source: Ambulatory Visit | Attending: Internal Medicine | Admitting: Internal Medicine

## 2016-04-11 LAB — PT/INR
PT INR: 2.5 — ABNORMAL HIGH (ref 0.5–1.3)
PT: 25.4 s — ABNORMAL HIGH (ref 9.5–11.5)

## 2016-05-11 ENCOUNTER — Encounter (HOSPITAL_COMMUNITY): Payer: Self-pay | Admitting: Emergency Medicine

## 2016-05-11 ENCOUNTER — Ambulatory Visit (HOSPITAL_COMMUNITY)
Admission: EM | Admit: 2016-05-11 | Discharge: 2016-05-11 | Disposition: A | Discharge status: Home / Self Care | Payer: Medicare Other | Attending: Emergency Medicine | Admitting: Emergency Medicine

## 2016-05-11 ENCOUNTER — Ambulatory Visit (INDEPENDENT_AMBULATORY_CARE_PROVIDER_SITE_OTHER): Payer: Medicare Other

## 2016-05-11 DIAGNOSIS — J441 Chronic obstructive pulmonary disease with (acute) exacerbation: Secondary | ICD-10-CM

## 2016-05-11 DIAGNOSIS — R0602 Shortness of breath: Secondary | ICD-10-CM

## 2016-05-11 DIAGNOSIS — R05 Cough: Secondary | ICD-10-CM | POA: Diagnosis not present

## 2016-05-11 DIAGNOSIS — R059 Cough, unspecified: Secondary | ICD-10-CM

## 2016-05-11 LAB — POCT I-STAT, CHEM 8
BUN: 35 mg/dL — ABNORMAL HIGH (ref 6–20)
CALCIUM ION: 1.25 mmol/L (ref 1.15–1.40)
Chloride: 94 mmol/L — ABNORMAL LOW (ref 101–111)
Creatinine, Ser: 1.3 mg/dL — ABNORMAL HIGH (ref 0.44–1.00)
GLUCOSE: 127 mg/dL — AB (ref 65–99)
HCT: 42 % (ref 36.0–46.0)
Hemoglobin: 14.3 g/dL (ref 12.0–15.0)
Potassium: 3.5 mmol/L (ref 3.5–5.1)
Sodium: 136 mmol/L (ref 135–145)
TCO2: 30 mmol/L (ref 0–100)

## 2016-05-11 MED ORDER — AZITHROMYCIN 250 MG PO TABS: 250.0000 mg | ORAL_TABLET | Freq: Every day | ORAL | 0 refills | Status: AC

## 2016-05-11 MED ORDER — BENZONATATE 100 MG PO CAPS: 100.0000 mg | ORAL_CAPSULE | Freq: Three times a day (TID) | ORAL | 0 refills | Status: AC

## 2016-05-11 MED ORDER — PREDNISONE 10 MG (21) PO TBPK: 10.0000 mg | ORAL_TABLET | Freq: Every day | ORAL | 0 refills | Status: AC

## 2016-05-11 NOTE — ED Triage Notes (Addendum)
Here for cold sx onset 3 days associated w/prod cough, abd pain due to cough, CP, SOB, HA  Hx of CHF, paralyzed diaphragm, hx of pacemaker and Afib  On 2.5 L of O2  Talking in complete sentence w/no signs of acute distress.

## 2016-05-11 NOTE — ED Provider Notes (Signed)
CSN: [id number]     Arrival date & time 05/11/16  1112 History   First MD Initiated Contact with Patient 05/11/16 1304     Chief Complaint  Patient presents with  . URI   (Consider location/radiation/quality/duration/timing/severity/associated sxs/prior Treatment) Patient c/o uri sx's for over a week.   The history is provided by the patient.  URI  Presenting symptoms: congestion and cough   Severity:  Moderate Onset quality:  Sudden Duration:  1 week Timing:  Constant Progression:  Worsening Chronicity:  New Relieved by:  Nothing Worsened by:  Nothing   Past Medical History:  Diagnosis Date  . A-fib (HCC)    coumadin  . Arthritis    DJD  . Brain aneurysm   . Cancer ([place])    skin cancer  . CHF (congestive heart failure) ([place])   . COPD (chronic obstructive pulmonary disease) ([place])   . Diaphragm paralysis   . Glaucoma   . Hypertension   . MI (myocardial infarction)   . Osteoarthritis of back   . Pacemaker 2002  . Stroke [facility])    Past Surgical History:  Procedure Laterality Date  . EYE SURGERY     catarcts  . TOTAL KNEE ARTHROPLASTY     bilaterally   No family history on file. Social History  Substance Use Topics  . Smoking status: Never Smoker  . Smokeless tobacco: Never Used  . Alcohol use No   OB History    No data available     Review of Systems  Constitutional: Negative.   HENT: Positive for congestion.   Eyes: Negative.   Respiratory: Positive for cough.   Cardiovascular: Negative.   Gastrointestinal: Negative.   Endocrine: Negative.   Genitourinary: Negative.   Musculoskeletal: Negative.   Allergic/Immunologic: Negative.   Neurological: Negative.   Hematological: Negative.   Psychiatric/Behavioral: Negative.     Allergies  Codeine; Morphine and related; and Sulfa antibiotics  Home Medications   Prior to Admission medications   Medication Sig Start Date End Date Taking? Authorizing Provider  albuterol (PROVENTIL HFA;VENTOLIN HFA)  108 (90 BASE) MCG/ACT inhaler Inhale 2 puffs into the lungs every 6 (six) hours as needed. For shortness of breath   Yes Historical Provider, MD  atenolol (TENORMIN) 100 MG tablet Take 100 mg by mouth daily.   Yes Historical Provider, MD  benzonatate (TESSALON) 100 MG capsule Take by mouth 3 (three) times daily as needed for cough.   Yes Historical Provider, MD  Carbidopa-Levodopa ER (SINEMET CR) 25-100 MG tablet controlled release Take 1 tablet by mouth 2 (two) times daily.   Yes Historical Provider, MD  clotrimazole-betamethasone (LOTRISONE) cream Apply 1 application topically 2 (two) times daily.   Yes Historical Provider, MD  diltiazem (TIAZAC) 240 MG 24 hr capsule Take 240 mg by mouth daily.   Yes Historical Provider, MD  estradiol (ESTRACE) 0.1 MG/GM vaginal cream Place 1 Applicatorful vaginally at bedtime.   Yes Historical Provider, MD  furosemide (LASIX) 40 MG tablet Take 40 mg by mouth daily.   Yes Historical Provider, MD  HYDROcodone-acetaminophen (NORCO) 10-325 MG per tablet Take 1 tablet by mouth 3 (three) times daily as needed. For pain   Yes Historical Provider, MD  ketoconazole (NIZORAL) 2 % cream Apply 1 application topically daily.   Yes Historical Provider, MD  latanoprost (XALATAN) 0.005 % ophthalmic solution 1 drop at bedtime.   Yes Historical Provider, MD  omeprazole (PRILOSEC) 20 MG capsule Take 20 mg by mouth daily.   Yes Historical Provider,  MD  potassium chloride SA (K-DUR,KLOR-CON) 20 MEQ tablet Take 40 mEq by mouth daily.   Yes Historical Provider, MD  rosuvastatin (CRESTOR) 10 MG tablet Take 10 mg by mouth at bedtime.   Yes Historical Provider, MD  spironolactone (ALDACTONE) 50 MG tablet Take 50 mg by mouth daily.   Yes Historical Provider, MD  valsartan (DIOVAN) 80 MG tablet Take 80 mg by mouth daily.   Yes Historical Provider, MD  warfarin (COUMADIN) 5 MG tablet Take 2.5 mg by mouth daily.   Yes Historical Provider, MD  azithromycin (ZITHROMAX) 250 MG tablet Take 1  tablet (250 mg total) by mouth daily. Take first 2 tablets together, then 1 every day until finished. 05/11/16   Lysbeth Penner, FNP  benzonatate (TESSALON) 100 MG capsule Take 1 capsule (100 mg total) by mouth every 8 (eight) hours. 05/11/16   Lysbeth Penner, FNP  CALCIUM CARBONATE PO Take 1 tablet by mouth daily.    Historical Provider, MD  clonazePAM (KLONOPIN) 0.5 MG tablet Take 0.5 mg by mouth 2 (two) times daily as needed. For anxiety    Historical Provider, MD  dextromethorphan-guaiFENesin (MUCINEX DM) 30-600 MG per 12 hr tablet Take 1 tablet by mouth 2 (two) times daily. 02/14/12   Charlynne Cousins, MD  moxifloxacin (AVELOX) 400 MG tablet Take 1 tablet (400 mg total) by mouth daily. 02/14/12   Charlynne Cousins, MD  predniSONE (DELTASONE) 10 MG tablet Takes 3 tablets for 1 days, then 2 tablets for 1 days, then 1 tablets for 1 days,  and then stop. 02/14/12   Charlynne Cousins, MD  predniSONE (STERAPRED UNI-PAK 21 TAB) 10 MG (21) TBPK tablet Take 1 tablet (10 mg total) by mouth daily. 4 po qd x 2d then 3 po qd x 2 days then 2po qd x 2days then 1po qd x 2 days then stop 05/11/16   Lysbeth Penner, FNP  ranitidine (ZANTAC) 150 MG tablet Take 150 mg by mouth 2 (two) times daily.    Historical Provider, MD   Meds Ordered and Administered this Visit  Medications - No data to display  BP 117/67 (BP Location: Left Arm)   Pulse 63   Temp 97.9 F (36.6 C) (Oral)   Resp 20   SpO2 100%  No data found.   Physical Exam  Constitutional:  Chronically ill appearing elderly femail wearing nasal cannula oxygen in NAD.  HENT:  Head: Normocephalic.  Right Ear: External ear normal.  Left Ear: External ear normal.  Mouth/Throat: Oropharynx is clear and moist.  Eyes: Conjunctivae and EOM are normal. Pupils are equal, round, and reactive to light.  Neck: Normal range of motion. Neck supple.  Cardiovascular: Normal rate, regular rhythm and normal heart sounds.   Pulmonary/Chest: Effort  normal. She has wheezes.  Abdominal: Soft.  Nursing note and vitals reviewed.   Urgent Care Course   Clinical Course     Procedures (including critical care time)  Labs Review Labs Reviewed  POCT I-STAT, CHEM 8 - Abnormal; Notable for the following:       Result Value   Chloride 94 (*)    BUN 35 (*)    Creatinine, Ser 1.30 (*)    Glucose, Bld 127 (*)    All other components within normal limits    Imaging Review Dg Chest 2 View  Result Date: 05/11/2016 CLINICAL DATA:  Shortness of breath. EXAM: CHEST  2 VIEW COMPARISON:  02/13/2012 FINDINGS: Gas dilated stomach or transverse colon. There  is chronic elevation of the left diaphragm. Cardiopericardial enlargement without Kerley lines or effusion. Streaky right infrahilar density favoring atelectasis/scarring. No air bronchogram. No pneumothorax. Dual-chamber pacer leads from the left are in stable position. IMPRESSION: 1. Cardiomegaly without failure. 2. Right infrahilar atelectasis or scarring, mild. 3. Chronic elevation of the left diaphragm. Electronically Signed   By: Monte Fantasia M.D.   On: 05/11/2016 13:23     Visual Acuity Review  Right Eye Distance:   Left Eye Distance:   Bilateral Distance:    Right Eye Near:   Left Eye Near:    Bilateral Near:         MDM  COPD exacerbation - Zpak Pred taper 10mg  4x2 3xs2 2x2 1x2 #20 Tessalon perles  Push po fluids, rest, tylenol otc prn as directed for fever, arthralgias, and myalgias.  Follow up prn if sx's continue or persist.  Continue oxygen  And continue to monitor weight and if having any increase over 3 pounds in 24 hours go to ED.    Lysbeth Penner, FNP 05/11/16 1420

## 2016-06-09 ENCOUNTER — Other Ambulatory Visit
Admission: RE | Admit: 2016-06-09 | Discharge: 2016-06-09 | Disposition: A | Discharge status: Home / Self Care | Payer: Medicare Other | Source: Ambulatory Visit | Attending: Internal Medicine | Admitting: Internal Medicine

## 2016-06-09 LAB — CBC AND DIFFERENTIAL
Basophils %: 0.5 % (ref 0.0–3.0)
Basophils Absolute: 0 10*3/uL (ref 0.0–0.3)
Eosinophils %: 0.8 % (ref 0.0–7.0)
Eosinophils Absolute: 0 10*3/uL (ref 0.0–0.8)
Hematocrit: 38.1 % (ref 36.0–48.0)
Hemoglobin: 12.4 gm/dL (ref 12.0–16.0)
Lymphocytes Absolute: 1.4 10*3/uL (ref 0.6–5.1)
Lymphocytes: 26.6 % (ref 15.0–46.0)
MCH: 32 pg (ref 28–35)
MCHC: 33 gm/dL (ref 32–36)
MCV: 98 fL (ref 80–100)
MPV: 10 fL (ref 6.0–10.0)
Monocytes Absolute: 0.7 10*3/uL (ref 0.1–1.7)
Monocytes: 12.5 % (ref 3.0–15.0)
Neutrophils %: 59.7 % (ref 42.0–78.0)
Neutrophils Absolute: 3.1 10*3/uL (ref 1.7–8.6)
PLT CT: 116 10*3/uL — ABNORMAL LOW (ref 130–440)
RBC: 3.88 10*6/uL (ref 3.80–5.00)
RDW: 13.3 % (ref 11.0–14.0)
WBC: 5.3 10*3/uL (ref 4.0–11.0)

## 2016-06-09 LAB — COMPREHENSIVE METABOLIC PANEL
ALT: 20 U/L (ref 0–55)
AST (SGOT): 25 U/L (ref 10–42)
Albumin/Globulin Ratio: 1.74 Ratio — ABNORMAL HIGH (ref 0.70–1.50)
Albumin: 4 gm/dL (ref 3.5–5.0)
Alkaline Phosphatase: 129 U/L (ref 40–145)
Anion Gap: 13.5 mMol/L (ref 7.0–18.0)
BUN / Creatinine Ratio: 34.3 Ratio — ABNORMAL HIGH (ref 10.0–30.0)
BUN: 35 mg/dL — ABNORMAL HIGH (ref 7–22)
Bilirubin, Total: 0.6 mg/dL (ref 0.1–1.2)
CO2: 25.9 mMol/L (ref 20.0–30.0)
Calcium: 10.1 mg/dL (ref 8.5–10.5)
Chloride: 104 mMol/L (ref 98–110)
Creatinine: 1.02 mg/dL (ref 0.60–1.20)
EGFR: 53 mL/min/{1.73_m2} — ABNORMAL LOW (ref 60–150)
Globulin: 2.3 gm/dL (ref 2.0–4.0)
Glucose: 92 mg/dL (ref 70–99)
Osmolality Calc: 285 mOsm/kg (ref 275–300)
Potassium: 4.4 mMol/L (ref 3.5–5.3)
Protein, Total: 6.3 gm/dL (ref 6.0–8.3)
Sodium: 139 mMol/L (ref 136–147)

## 2016-06-09 LAB — LIPID PANEL
Cholesterol: 141 mg/dL (ref 75–199)
Coronary Heart Disease Risk: 2.31
HDL: 61 mg/dL (ref 45–65)
LDL Calculated: 68 mg/dL
Triglycerides: 61 mg/dL (ref 10–150)
VLDL: 12 (ref 0–40)

## 2016-06-09 LAB — PT/INR
PT INR: 2.5 — ABNORMAL HIGH (ref 0.5–1.3)
PT: 25.3 s — ABNORMAL HIGH (ref 9.5–11.5)

## 2016-06-13 ENCOUNTER — Ambulatory Visit: Payer: Self-pay

## 2016-06-26 ENCOUNTER — Emergency Department: Payer: Medicare Other

## 2016-06-26 ENCOUNTER — Emergency Department
Admission: EM | Admit: 2016-06-26 | Discharge: 2016-06-26 | Disposition: A | Discharge status: Other Acute Inpatient Hospital | Payer: Medicare Other | Attending: Emergency Medicine | Admitting: Emergency Medicine

## 2016-06-26 DIAGNOSIS — I5023 Acute on chronic systolic (congestive) heart failure: Secondary | ICD-10-CM | POA: Insufficient documentation

## 2016-06-26 DIAGNOSIS — R0602 Shortness of breath: Secondary | ICD-10-CM | POA: Insufficient documentation

## 2016-06-26 DIAGNOSIS — J189 Pneumonia, unspecified organism: Secondary | ICD-10-CM

## 2016-06-26 DIAGNOSIS — J168 Pneumonia due to other specified infectious organisms: Secondary | ICD-10-CM | POA: Insufficient documentation

## 2016-06-26 DIAGNOSIS — I509 Heart failure, unspecified: Secondary | ICD-10-CM

## 2016-06-26 DIAGNOSIS — I4891 Unspecified atrial fibrillation: Secondary | ICD-10-CM | POA: Insufficient documentation

## 2016-06-26 DIAGNOSIS — J441 Chronic obstructive pulmonary disease with (acute) exacerbation: Secondary | ICD-10-CM | POA: Insufficient documentation

## 2016-06-26 DIAGNOSIS — Z5181 Encounter for therapeutic drug level monitoring: Secondary | ICD-10-CM | POA: Insufficient documentation

## 2016-06-26 DIAGNOSIS — I110 Hypertensive heart disease with heart failure: Secondary | ICD-10-CM | POA: Insufficient documentation

## 2016-06-26 LAB — CBC AND DIFFERENTIAL
Basophils %: 0.6 % (ref 0.0–3.0)
Basophils Absolute: 0 10*3/uL (ref 0.0–0.3)
Eosinophils %: 0.9 % (ref 0.0–7.0)
Eosinophils Absolute: 0.1 10*3/uL (ref 0.0–0.8)
Hematocrit: 38.6 % (ref 36.0–48.0)
Hemoglobin: 13.1 gm/dL (ref 12.0–16.0)
Lymphocytes Absolute: 1.4 10*3/uL (ref 0.6–5.1)
Lymphocytes: 17.5 % (ref 15.0–46.0)
MCH: 32 pg (ref 28–35)
MCHC: 34 gm/dL (ref 31–36)
MCV: 93 fL (ref 80–100)
MPV: 11.5 fL — ABNORMAL HIGH (ref 6.0–10.0)
Monocytes Absolute: 0.6 10*3/uL (ref 0.1–1.7)
Monocytes: 7.9 % (ref 3.0–15.0)
Neutrophils %: 73.2 % (ref 42.0–78.0)
Neutrophils Absolute: 5.9 10*3/uL (ref 1.7–8.6)
PLT CT: 100 10*3/uL — ABNORMAL LOW (ref 130–440)
RBC: 4.15 10*6/uL (ref 3.80–5.00)
RDW: 12.2 % (ref 10.5–14.5)
WBC: 8.1 10*3/uL (ref 4.00–11.00)

## 2016-06-26 LAB — VH ARTERIAL BLOOD GAS
Base Excess, Arterial: 4.9 mMol/L — ABNORMAL HIGH (ref <=2.0)
Carboxyhemoglobin: 1.5 % (ref 0.0–19.9)
HCO3, ABG: 28.5 mMol/L (ref 20.0–29.0)
O2 Lit Per Min: 4 L/min
O2 Sat, Arterial: 96.2 % (ref 95.0–99.0)
pCO2, Arterial: 42 mm Hg (ref 35–45)
pH, Arterial: 7.45 pH (ref 7.35–7.45)
pO2, Arterial: 77 mm Hg (ref 75–100)

## 2016-06-26 LAB — COMPREHENSIVE METABOLIC PANEL
ALT: 17 U/L (ref 0–55)
AST (SGOT): 24 U/L (ref 10–42)
Albumin/Globulin Ratio: 1.55 Ratio — ABNORMAL HIGH (ref 0.70–1.50)
Albumin: 4.1 gm/dL (ref 3.5–5.0)
Alkaline Phosphatase: 133 U/L (ref 40–145)
Anion Gap: 15.5 mMol/L (ref 7.0–18.0)
BUN / Creatinine Ratio: 34.5 Ratio — ABNORMAL HIGH (ref 10.0–30.0)
BUN: 29 mg/dL — ABNORMAL HIGH (ref 7–22)
Bilirubin, Total: 1 mg/dL (ref 0.1–1.2)
CO2: 28 mMol/L (ref 20.0–30.0)
Calcium: 9.9 mg/dL (ref 8.5–10.5)
Chloride: 99 mMol/L (ref 98–110)
Creatinine: 0.84 mg/dL (ref 0.60–1.20)
EGFR: 67 mL/min/{1.73_m2} (ref 60–150)
Globulin: 2.7 gm/dL (ref 2.0–4.0)
Glucose: 118 mg/dL — ABNORMAL HIGH (ref 70–99)
Osmolality Calc: 284 mOsm/kg (ref 275–300)
Potassium: 3.5 mMol/L (ref 3.5–5.3)
Protein, Total: 6.8 gm/dL (ref 6.0–8.3)
Sodium: 139 mMol/L (ref 136–147)

## 2016-06-26 LAB — TROPONIN I: Troponin I: 0.01 ng/mL (ref 0.00–0.02)

## 2016-06-26 LAB — B-TYPE NATRIURETIC PEPTIDE: B-Natriuretic Peptide: 463.9 pg/mL — ABNORMAL HIGH (ref 0.0–100.0)

## 2016-06-26 LAB — VH INFLUENZA A/B RAPID TEST
Influenza A: NEGATIVE
Influenza B: NEGATIVE

## 2016-06-26 LAB — PT/INR
PT INR: 3.2 — ABNORMAL HIGH (ref 0.8–1.2)
PT: 31.4 s — ABNORMAL HIGH (ref 9.4–12.5)

## 2016-06-26 LAB — LACTIC ACID, PLASMA: Lactic Acid: 1.98 mMol/L (ref 0.50–2.10)

## 2016-06-26 LAB — D-DIMER, QUANTITATIVE: D-Dimer: 0.29 mg/L FEU (ref 0.19–0.52)

## 2016-06-26 MED ORDER — DILTIAZEM HCL 125 MG/25ML IV SOLN
INTRAVENOUS | Status: AC
Start: 2016-06-26 — End: ?
  Filled 2016-06-26: qty 25

## 2016-06-26 MED ORDER — NITROGLYCERIN 2 % TD OINT
TOPICAL_OINTMENT | TRANSDERMAL | Status: AC
Start: 2016-06-26 — End: ?
  Filled 2016-06-26: qty 1

## 2016-06-26 MED ORDER — SODIUM CHLORIDE 0.9 % IV SOLN
500.0000 mg | Freq: Once | INTRAVENOUS | Status: AC
Start: 2016-06-26 — End: 2016-06-26
  Administered 2016-06-26: 500 mg via INTRAVENOUS

## 2016-06-26 MED ORDER — NITROGLYCERIN 2 % TD OINT
0.5000 [in_us] | TOPICAL_OINTMENT | Freq: Once | TRANSDERMAL | Status: AC
Start: 2016-06-26 — End: 2016-06-26
  Administered 2016-06-26: 0.5 [in_us] via TOPICAL

## 2016-06-26 MED ORDER — SODIUM CHLORIDE 0.9 % IV MBP
1.0000 g | Freq: Once | INTRAVENOUS | Status: AC
Start: 2016-06-26 — End: 2016-06-26
  Administered 2016-06-26: 1 g via INTRAVENOUS

## 2016-06-26 MED ORDER — MORPHINE SULFATE 2 MG/ML IJ/IV SOLN (WRAP)
Status: AC
Start: 2016-06-26 — End: ?
  Filled 2016-06-26: qty 1

## 2016-06-26 MED ORDER — FUROSEMIDE 10 MG/ML IJ SOLN
20.0000 mg | Freq: Once | INTRAMUSCULAR | Status: AC
Start: 2016-06-26 — End: 2016-06-26
  Administered 2016-06-26: 20 mg via INTRAVENOUS

## 2016-06-26 MED ORDER — LORAZEPAM 2 MG/ML IJ SOLN
0.5000 mg | Freq: Once | INTRAMUSCULAR | Status: AC
Start: 2016-06-26 — End: 2016-06-26
  Administered 2016-06-26: 0.5 mg via INTRAVENOUS

## 2016-06-26 MED ORDER — LORAZEPAM 2 MG/ML IJ SOLN
INTRAMUSCULAR | Status: AC
Start: 2016-06-26 — End: ?
  Filled 2016-06-26: qty 1

## 2016-06-26 MED ORDER — DILTIAZEM HCL 125 MG/25ML IV SOLN
5.0000 mg/h | INTRAVENOUS | Status: DC
Start: 2016-06-26 — End: 2016-06-26
  Administered 2016-06-26: 04:00:00 5 mg/h via INTRAVENOUS

## 2016-06-26 MED ORDER — SODIUM CHLORIDE 0.9 % IV SOLN
INTRAVENOUS | Status: AC
Start: 2016-06-26 — End: ?
  Filled 2016-06-26: qty 100

## 2016-06-26 MED ORDER — ALBUTEROL-IPRATROPIUM 2.5-0.5 (3) MG/3ML IN SOLN
RESPIRATORY_TRACT | Status: AC
Start: 2016-06-26 — End: ?
  Filled 2016-06-26: qty 3

## 2016-06-26 MED ORDER — METHYLPREDNISOLONE SODIUM SUCC 125 MG IJ SOLR
INTRAMUSCULAR | Status: AC
Start: 2016-06-26 — End: ?
  Filled 2016-06-26: qty 2

## 2016-06-26 MED ORDER — AZITHROMYCIN 500 MG IV SOLR
INTRAVENOUS | Status: AC
Start: 2016-06-26 — End: ?
  Filled 2016-06-26: qty 500

## 2016-06-26 MED ORDER — CEFTRIAXONE SODIUM 1 G IJ SOLR
INTRAMUSCULAR | Status: AC
Start: 2016-06-26 — End: ?
  Filled 2016-06-26: qty 1000

## 2016-06-26 MED ORDER — DILTIAZEM HCL 5 MG/ML IV SOLN (WRAP)
INTRAVENOUS | Status: AC
Start: 2016-06-26 — End: ?
  Filled 2016-06-26: qty 5

## 2016-06-26 MED ORDER — ALBUTEROL-IPRATROPIUM 2.5-0.5 (3) MG/3ML IN SOLN
3.0000 mL | Freq: Four times a day (QID) | RESPIRATORY_TRACT | Status: DC
Start: 2016-06-26 — End: 2016-06-26
  Administered 2016-06-26: 3 mL via RESPIRATORY_TRACT

## 2016-06-26 MED ORDER — METHYLPREDNISOLONE SODIUM SUCC 125 MG IJ SOLR
125.0000 mg | Freq: Once | INTRAMUSCULAR | Status: AC
Start: 2016-06-26 — End: 2016-06-26
  Administered 2016-06-26: 03:00:00 125 mg via INTRAVENOUS

## 2016-06-26 MED ORDER — FUROSEMIDE 10 MG/ML IJ SOLN
INTRAMUSCULAR | Status: AC
Start: 2016-06-26 — End: ?
  Filled 2016-06-26: qty 4

## 2016-06-26 MED ORDER — DILTIAZEM HCL 5 MG/ML IV SOLN (WRAP)
10.0000 mg | Freq: Once | INTRAVENOUS | Status: AC
Start: 2016-06-26 — End: 2016-06-26
  Administered 2016-06-26: 10 mg via INTRAVENOUS

## 2016-06-26 NOTE — ED Notes (Signed)
Pt requesting to have BiPap removed, RT aware, Dr Marchelle Folks ok with removing BiPap.

## 2016-06-26 NOTE — ED Notes (Signed)
VMT here, report to crew, report also called to [facility].

## 2016-06-26 NOTE — ED Notes (Signed)
BiPap removed by RT, pt appears comfortable but intermittently complains that she can't breath.

## 2016-06-26 NOTE — ED Provider Notes (Signed)
[facility]   EMERGENCY DEPARTMENT HISTORY AND PHYSICAL EXAM    Date: 06/26/2016  Patient Name: [name]  Attending Physician: b. Marchelle Folks, MD  Patient DOB:  07-10-1940  MRN:  [id number]  Room:  ED1/ED1-A    Patient was evaluated by ED physician,B. Marchelle Folks, MD at 1:47 AM    History     Chief Complaint   Patient presents with   . Shortness of Breath       HPI:  The patient [name], is a 76 y.o. female who presents with chief complaint of SOB   Always SOB but worse for past 6-7 days  No fever  No cough  Chronic orthopnea  No CP  Has hx of CHF  No other complaints    Location- see above  Severity-    Duration-    Radiation -   Character-    Onset-    Modifying-    Associated symptoms-     PCP:  Ladona Horns, MD      Past Medical History       Past Medical History:   Diagnosis Date   . Aortic aneurysm    . Atrial fibrillation    . Chronic obstructive pulmonary disease    . Gastroesophageal reflux disease    . Hypertension    . Malignant neoplasm    . Pacemaker          Past Surgical History       Past Surgical History:   Procedure Laterality Date   . CARDIAC PACEMAKER PLACEMENT      2002   . HYSTERECTOMY           Family History    Family History   Problem Relation Age of Onset   . Breast cancer Sister        Social History    Social History     Social History   . Marital status: Widowed     Spouse name: N/A   . Number of children: N/A   . Years of education: N/A     Social History Main Topics   . Smoking status: Never Smoker   . Smokeless tobacco: Never Used   . Alcohol use No   . Drug use: No   . Sexual activity: Not Asked     Other Topics Concern   . None     Social History Narrative   . None       Allergies    Allergies   Allergen Reactions   . Adhesive [Wound Dressing Adhesive]    . Codeine    . Morphine    . Nitroglycerin    . Pain Patch [Menthol]          Current/Home Medications    Current/Home Medications    ALBUTEROL (PROVENTIL) (2.5 MG/3ML) 0.083% NEBULIZER  SOLUTION    Take 2.5 mg by nebulization every 6 (six) hours as needed for Wheezing.    ATENOLOL (TENORMIN) 100 MG TABLET    Take 100 mg by mouth daily.    BENZONATATE (TESSALON) 200 MG CAPSULE    Take 200 mg by mouth 3 (three) times daily as needed.    CARBIDOPA-LEVODOPA (SINEMET) 25-100 MG PER TABLET    Take 1 tablet by mouth 2 (two) times daily.    CLONAZEPAM (KLONOPIN) 0.5 MG TABLET    Take 0.5 mg by mouth 2 (two) times daily as needed.    DILTIAZEM (TIAZAC) 240 MG 24 HR CAPSULE  Take 240 mg by mouth 2 (two) times daily.       DOCUSATE SODIUM (COLACE) 100 MG CAPSULE    Take 100 mg by mouth daily.    ESTRADIOL (ESTRACE) 0.1 MG/GM VAGINAL CREAM    Place 2 g vaginally daily.    FLUOROMETHOLONE (EFLONE) 0.1 % OPHTHALMIC SUSPENSION    1 drop 4 (four) times daily.    FUROSEMIDE (LASIX) 40 MG TABLET    Take 1 tablet (40 mg total) by mouth 2 (two) times daily. Take on empty stomach    HYDROCODONE-ACETAMINOPHEN (NORCO) 5-325 MG PER TABLET    Take 2 tablets by mouth 2 (two) times daily.       KETOCONAZOLE (NIZORAL) 2 % CREAM    Apply topically daily.    LATANOPROST (XALATAN) 0.005 % OPHTHALMIC SOLUTION    1 drop nightly.    OMEPRAZOLE (PRILOSEC) 20 MG CAPSULE    Take 20 mg by mouth daily.    POTASSIUM CHLORIDE (KLOR-CON) 20 MEQ PACKET    Take 20 mEq by mouth 2 (two) times daily.    ROSUVASTATIN (CRESTOR) 10 MG TABLET    Take 10 mg by mouth daily.    SPIRONOLACTONE (ALDACTONE) 50 MG TABLET    Take 50 mg by mouth daily.    WARFARIN (COUMADIN) 3 MG TABLET    Take 1 tablet (3 mg total) by mouth daily.       Vital Signs     BP 114/76   Pulse (!) 120   Temp (!) 100.9 F (38.3 C) (Tympanic)   Resp 22   Ht 1.626 m   Wt 73.5 kg   SpO2 95%   BMI 27.81 kg/m   Patient Vitals for the past 24 hrs:   BP Temp Temp src Pulse Resp SpO2 Height Weight   06/26/16 0320 - - - - - 95 % - -   06/26/16 0311 114/76 - - (!) 120 22 94 % - -   06/26/16 0255 143/78 - - (!) 135 20 97 % - -   06/26/16 0233 - - - - - 96 % - -   06/26/16 0228 - -  - (!) 145 19 - - -   06/26/16 0221 123/76 - - (!) 112 22 93 % - -   06/26/16 0218 - (!) 100.9 F (38.3 C) Tympanic - - - - -   06/26/16 0205 132/85 - - (!) 124 20 93 % - -   06/26/16 0149 120/68 - - (!) 148 18 93 % 1.626 m 73.5 kg         Review of Systems     Constitutional: No fever      Head: No headache    Eyes: No  redness or pain    ENT: No ear pain  No sore throat    Cardiovascular: no c/o chest pain.  pos DOE.  pos orthopnea    Respiratory: pos cough    pos shortness of breath.   No c/o chest wall pain    GI: No vomiting.  No diarrhea. No abd pain    Genitourinary:   no frequency, urgency, pain, burning    Musculoskeletal:   no pain    No edema    No symptoms of DVT    Skin: no rashes or skin lesions.    Neurologic:no c/o weakness or paresthesia     No change in speech or vision    All other systems reviewed and negative  Physical Exam     CONSTITUTIONAL:  Vital signs reviewed     Well appearing,  Patient appears uncomfortable    Alert and oriented X 3    HEAD:   Atraumatic    Normocephalic.    EENT: Normal    NECK:   Normal ROM             RESPIRATORY / CHEST:   Nontender   Breath sounds normal    CARDIOVASCULAR:   RRR     No murmur     No JVD- at 90deg    ABDOMEN: Soft   Nontender    No peritoneal signs     BS +  No masses    UPPER EXTREMITY:   Inspection normal           LOWER EXTREMITY:   Inspection normal      No sign of DVT          NEURO: Normal motor, sensory of all ext     CNs intact   Speech normal    Mentation normal    SPINE:      SKIN:  Warm,  Dry, Normal color, No rashes    LYMPHATIC:         ED Medication Orders     ED Medication Orders     Start Ordered     Status Ordering Provider    06/26/16 0335 06/26/16 0334  dilTIAZem (CARDIZEM) 100 mg in dextrose 5 % 100 mL infusion  Continuous     Route: Intravenous  Ordered Dose: 5 mg/hr     Ordered Beola Cord    06/26/16 0314 06/26/16 0313  methylPREDNISolone sodium succinate (Solu-MEDROL) injection 125 mg  Once in ED     Route:  Intravenous  Ordered Dose: 125 mg     Last MAR action:  Given Beola Cord    06/26/16 0258 06/26/16 0257  cefTRIAXone (ROCEPHIN) 1 g in sodium chloride 0.9 % 100 mL IVPB mini-bag plus  Once in ED     Route: Intravenous  Ordered Dose: 1 g     Last [facility] action:  New Bag Beola Cord    06/26/16 0258 06/26/16 0257  azithromycin (ZITHROMAX) 500 mg in sodium chloride 0.9 % 250 mL IVPB  Once     Route: Intravenous  Ordered Dose: 500 mg     Last [facility] action:  New Bag Beola Cord    06/26/16 0254 06/26/16 0254  furosemide (LASIX) injection 20 mg  Once     Route: Intravenous  Ordered Dose: 20 mg     Last MAR action:  Given Beola Cord    06/26/16 0251 06/26/16 0250  dilTIAZem (CARDIZEM) injection 10 mg  Once     Route: Intravenous  Ordered Dose: 10 mg     Last MAR action:  Given Beola Cord    06/26/16 0210 06/26/16 0209  albuterol-ipratropium (DUO-NEB) 2.5-0.5(3) mg/3 mL nebulizer 3 mL  RT - Every 6 hours scheduled     Route: Nebulization  Ordered Dose: 3 mL     Last MAR action:  Given Beola Cord    06/26/16 0158 06/26/16 0157  nitroglycerin (NITRO-BID) 2 % ointment 0.5 inch  Once     Route: Topical  Ordered Dose: 0.5 inch     Last MAR action:  Given Beola Cord    06/26/16 0158 06/26/16 0157  LORazepam (ATIVAN) injection 0.5 mg  Once     Route: Intravenous  Ordered Dose: 0.5 mg  Last MAR action:  Given [name]          Orders Placed During this Encounter     Orders Placed This Encounter   Procedures   . Influenza A / B Rapid Test   . Blood Culture-Venipuncture #1   . XR Chest AP Portable   . B-type Natriuretic Peptide   . Troponin I   . CBC and differential   . Comprehensive metabolic panel   . Lactic acid, plasma   . D-dimer, quantitative   . Arterial Blood Gas   . Prothrombin time/INR   . ECG 12 lead       Diagnostic Study Results     Labs     Results     Procedure Component Value Units Date/Time    Blood Culture-Venipuncture #1 [[id number]] Collected:  06/26/16 0305     Specimen:  Blood from Venipuncture Updated:  06/26/16 0323    Influenza A / B Rapid Test [[id number]] Collected:  06/26/16 0310    Specimen:  Nasal Wash Updated:  06/26/16 0322    Narrative:       Influenza A antigen detection tests are unable to distinquish between novel and seasonal influenza A.    A negative result for either Influenza A or B antigen does not exclude influenza virus infection. Clinical correlation required.    All positive influenza antigen tests (A or B) require placement of patient on droplet precaution isolation.    B-type Natriuretic Peptide [[id number]]  (Abnormal) Collected:  06/26/16 0145    Specimen:  Blood Updated:  06/26/16 0239     B-Natriuretic Peptide 463.9 (H) pg/mL     Arterial Blood Gas [[id number]]  (Abnormal) Collected:  06/26/16 0211    Specimen:  Arterial Updated:  06/26/16 0232     Allen's Test Pass     Draw Site - ABG Left Radial Artery     O2 Lit Per Min 4 L/min      VIA N/C     pH, Arterial 7.45 pH      pCO2, Arterial 42 mm Hg      pO2, Arterial 77 mm Hg      O2 Sat, Arterial 96.2 %      HCO3, ABG 28.5 mMol/L      Base Excess, Arterial 4.9 (H) mMol/L      Carboxyhemoglobin 1.5 %     Troponin I [[id number]] Collected:  06/26/16 0145    Specimen:  Plasma Updated:  06/26/16 0228     Troponin I 0.01 ng/mL     Comprehensive metabolic panel [[id number]]  (Abnormal) Collected:  06/26/16 0145    Specimen:  Plasma Updated:  06/26/16 0228     Sodium 139 mMol/L      Potassium 3.5 mMol/L      Chloride 99 mMol/L      CO2 28.0 mMol/L      Calcium 9.9 mg/dL      Glucose 536 (H) mg/dL      Creatinine 6.44 mg/dL      BUN 29 (H) mg/dL      Protein, Total 6.8 gm/dL      Albumin 4.1 gm/dL      Alkaline Phosphatase 133 U/L      ALT 17 U/L      AST (SGOT) 24 U/L      Bilirubin, Total 1.0 mg/dL      Albumin/Globulin Ratio 1.55 (H) Ratio      Anion Gap 15.5 mMol/L      BUN/Creatinine  Ratio 34.5 (H) Ratio      EGFR 67 mL/min/1.60m2      Osmolality Calc 284 mOsm/kg      Globulin 2.7 gm/dL     D-dimer,  quantitative [[id number]] Collected:  06/26/16 0145    Specimen:  Blood Updated:  06/26/16 0225     D-Dimer 0.29 mg/L FEU     Prothrombin time/INR [[id number]]  (Abnormal) Collected:  06/26/16 0145    Specimen:  Blood Updated:  06/26/16 0225     PT 31.4 (H) sec      PT INR 3.2 (H)    Lactic acid, plasma [[id number]] Collected:  06/26/16 0145    Specimen:  Blood Updated:  06/26/16 0217     Lactic acid 1.98 mMol/L     CBC and differential [[id number]]  (Abnormal) Collected:  06/26/16 0145    Specimen:  Blood from Blood Updated:  06/26/16 0207     WBC 8.1 K/cmm      RBC 4.15 M/cmm      Hemoglobin 13.1 gm/dL      Hematocrit 44.0 %      MCV 93 fL      MCH 32 pg      MCHC 34 gm/dL      RDW 10.2 %      PLT CT 100 (L) K/cmm      MPV 11.5 (H) fL      NEUTROPHIL % 73.2 %      Lymphocytes 17.5 %      Monocytes 7.9 %      Eosinophils % 0.9 %      Basophils % 0.6 %      Neutrophils Absolute 5.9 K/cmm      Lymphocytes Absolute 1.4 K/cmm      Monocytes Absolute 0.6 K/cmm      Eosinophils Absolute 0.1 K/cmm      BASO Absolute 0.0 K/cmm           Radiologic Studies  Radiology Results (24 Hour)     Procedure Component Value Units Date/Time    XR Chest AP Portable [[id number]] Collected:  06/26/16 3474    Order Status:  Completed Updated:  06/26/16 0216    Narrative:       Clinical History:  Reason For Exam: chest pain  Increased shortness of breath x several days, chronic respiratory and cardiac hx.    Examination:  Frontal view of the chest.    Comparison:  07/16/2015    Findings:  Left chest cardiac pacer with leads in place.  Cardiomegaly.  Small left pleural effusion.  Left basilar atelectasis and/or consolidation.      Impression:       Left basilar airspace disease.    ReadingStation:[id number]      .    Clinical Course / MDM     Notes: spoke with Dr Craige Cotta at [facility]  He accepts    Consults:       Data Review     Nursing records reviewed and agree: Yes    Pulse Oximetry Analysis - Normal  Laboratory results reviewed by EDP:  Yes  Radiologic study results reviewed by EDP: Yes    Rendering Provider: Beola Cord, MD      Monitors, EKG     Cardiac Monitor (interpreted by ED physician):  afib RVR    EKG (interpreted by ED physician):   RATE:136  RHYTHM:afib  PR:  QRS:98     diffuse t wave inversion  Old EKG's are paced  Critical Care     Critical care exclusive of time spent performing procedures.    Total time:  45 min        Clinical Impression & Disposition     Clinical Impression:  1. Pneumonia of left lower lobe due to infectious organism    2. COPD with acute exacerbation    3. Acute on chronic congestive heart failure, unspecified congestive heart failure type    4. Atrial fibrillation with RVR        Disposition  ED Disposition     ED Disposition Condition Date/Time Comment    Transfer to Another Facility  Mon Jun 26, 2016  3:22 AM [name] Murata should be transferred out to [facility]   Per pt request.          Prescriptions    New Prescriptions    No medications on file         Signed,  B. Marchelle Folks, MD  3:34 AM 06/26/2016               Beola Cord, MD  06/26/16 [phone]

## 2016-06-26 NOTE — Progress Notes (Signed)
Pt appears more comfortable with HR, WOB decreased.  Discussed with Md, weaning NIV settings with goal of returning to [facility].

## 2016-06-26 NOTE — Progress Notes (Signed)
Pt presented to ED with SOB.  States home O2 use and PRN nebs, WOB/SOB have been getting worse over thel last few days.  Duoneb, Ativan, and NTG patch given to decrease WOB.  Breath sounds decreased overall, forced exp wheeze mostly upper airway, few basilar crackles noted.  Placed on NIV to assist with WOB, pt tolerating well at this time. ABG showed adequate ventilation with mild hypoxemia prior to neb and NIV.

## 2016-06-26 NOTE — ED Notes (Signed)
RT in to draw ABG.

## 2016-06-26 NOTE — ED Triage Notes (Signed)
Pt arrives via private vehicle from home with complaint of about 7-10 days of shortness of breath that has gradually gotten worse, pt wears home O2 at 2 liters constant, pt tachypneic upon arrival, anxious appearing, insisting to have something to drink, provided pt with mouth swab, asked pt to await until some lab work has started to come back.  Pt placed on cardiac monitor/nibp/pulse ox, oxygen applied at 3.5 liters.

## 2016-06-28 LAB — ECG 12-LEAD
P-R Interval: 0 ms
Patient Age: 75 years
Q-T Interval(Corrected): 451 ms
Q-T Interval: 300 ms
QRS Axis: 10 deg
QRS Duration: 98 ms
T Axis: -84 years
Ventricular Rate: 136 //min

## 2016-07-17 ENCOUNTER — Emergency Department
Admission: EM | Admit: 2016-07-17 | Discharge: 2016-07-17 | Disposition: A | Discharge status: Home / Self Care | Payer: Medicare Other | Attending: Emergency Medicine | Admitting: Emergency Medicine

## 2016-07-17 ENCOUNTER — Emergency Department: Payer: Medicare Other

## 2016-07-17 DIAGNOSIS — T148XXA Other injury of unspecified body region, initial encounter: Secondary | ICD-10-CM

## 2016-07-17 DIAGNOSIS — S8012XA Contusion of left lower leg, initial encounter: Secondary | ICD-10-CM | POA: Insufficient documentation

## 2016-07-17 DIAGNOSIS — X58XXXA Exposure to other specified factors, initial encounter: Secondary | ICD-10-CM | POA: Insufficient documentation

## 2016-07-17 DIAGNOSIS — S80822A Blister (nonthermal), left lower leg, initial encounter: Secondary | ICD-10-CM | POA: Insufficient documentation

## 2016-07-17 NOTE — ED Student (Incomplete)
History     Chief Complaint   Patient presents with   . Leg Pain     HPI   Patient is a 76 yo female with a history of atrial fibrillation on Coumadin, COPD, and CHF presenting to ED with left leg pain and bruising. She says that on March 9, her son's dog ran into her. She says it did not break the skin at all but it did cause swelling and bruising. She says that the swelling has subsided but bruising has not gotten any better. She says that she has also formed a "blood blister". She says that her leg is painful when she gets up to walk, and it is very painful to walk. She says that the pain has not gotten any better or worse since the incident occurred. She came into the ED today because she is concerned that it could be infected and is worried about the "blood blister" may pop.     Past Medical History:   Diagnosis Date   . Aortic aneurysm    . Atrial fibrillation    . Chronic obstructive pulmonary disease    . Gastroesophageal reflux disease    . Hypertension    . Malignant neoplasm    . Pacemaker        Past Surgical History:   Procedure Laterality Date   . CARDIAC PACEMAKER PLACEMENT      2002   . HYSTERECTOMY         Family History   Problem Relation Age of Onset   . Breast cancer Sister        Social History   Substance Use Topics   . Smoking status: Never Smoker   . Smokeless tobacco: Never Used   . Alcohol use No       Review of Systems    Physical Exam   BP 140/84   Pulse 74   Temp 97.3 F (36.3 C) (Tympanic)   Resp 16   Ht 1.626 m   Wt 74.8 kg   SpO2 96%   BMI 28.32 kg/m     Physical Exam    ED Course     ED Course

## 2016-07-17 NOTE — ED Provider Notes (Signed)
Physician/Midlevel provider first contact with patient: 07/17/16 1054         History     Chief Complaint   Patient presents with   . Leg Pain     Pt with blister on left lower leg.  Pt  About one week ago had a dog run into left leg.  Pt with bruise at that area that has persisted.  Pt now with a blood filled blister at that site.  There is sharp pain to the area that is worse with touch.  No associated warm or swelling.        The history is provided by the patient.   Leg Pain   Location:  Leg  Leg location:  L lower leg  Pain details:     Quality:  Sharp    Radiates to:  Does not radiate    Severity:  Moderate    Onset quality:  Sudden    Duration:  8 days    Timing:  Constant    Progression:  Unchanged  Chronicity:  New  Foreign body present:  No foreign bodies  Relieved by:  Nothing  Worsened by:  Nothing  Ineffective treatments:  None tried  Associated symptoms: no back pain, no decreased ROM, no fever, no neck pain, no numbness, no stiffness and no swelling         Nursing (triage) note reviewed for the following pertinent information:  Lt lower leg pain and dark purple discoloration with 1" blood blister looking lesion on mid shin.  Pt collided with her big dog mARCH 9 and pt states this was the injury that caused the hematomat which was much larger last week.Over this time period, pt states pain intermittently worse and better.    Past Medical History:   Diagnosis Date   . Aortic aneurysm    . Atrial fibrillation    . Chronic obstructive pulmonary disease    . Gastroesophageal reflux disease    . Hypertension    . Malignant neoplasm    . Pacemaker        Past Surgical History:   Procedure Laterality Date   . CARDIAC PACEMAKER PLACEMENT      2002   . HYSTERECTOMY         Family History   Problem Relation Age of Onset   . Breast cancer Sister        Social  Social History   Substance Use Topics   . Smoking status: Never Smoker   . Smokeless tobacco: Never Used   . Alcohol use No       .     Allergies      Allergen Reactions   . Adhesive [Wound Dressing Adhesive]    . Codeine    . Morphine    . Nitroglycerin    . Pain Patch [Menthol]        Home Medications     Med List Status:  In Progress Set By: Para Skeans, RN at 07/17/2016 11:04 AM                albuterol (PROVENTIL) (2.5 MG/3ML) 0.083% nebulizer solution     Take 2.5 mg by nebulization every 6 (six) hours as needed for Wheezing.     atenolol (TENORMIN) 100 MG tablet     Take 100 mg by mouth daily.     benzonatate (TESSALON) 200 MG capsule     Take 200 mg by mouth 3 (three) times daily as needed.  carbidopa-levodopa (SINEMET) 25-100 MG per tablet     Take 1 tablet by mouth 2 (two) times daily.     clonazePAM (KLONOPIN) 0.5 MG tablet     Take 0.5 mg by mouth 2 (two) times daily as needed.     diltiazem (TIAZAC) 240 MG 24 hr capsule     Take 240 mg by mouth 2 (two) times daily.        docusate sodium (COLACE) 100 MG capsule     Take 100 mg by mouth daily.     estradiol (ESTRACE) 0.1 MG/GM vaginal cream     Place 2 g vaginally daily.     fluorometholone (EFLONE) 0.1 % ophthalmic suspension     1 drop 4 (four) times daily.     furosemide (LASIX) 40 MG tablet     Take 1 tablet (40 mg total) by mouth 2 (two) times daily. Take on empty stomach     ketoconazole (NIZORAL) 2 % cream     Apply topically daily.     latanoprost (XALATAN) 0.005 % ophthalmic solution     1 drop nightly.     omeprazole (PRILOSEC) 20 MG capsule     Take 20 mg by mouth daily.     potassium chloride (KLOR-CON) 20 MEQ packet     Take 20 mEq by mouth daily.         rosuvastatin (CRESTOR) 10 MG tablet     Take 10 mg by mouth daily.     spironolactone (ALDACTONE) 50 MG tablet     Take 50 mg by mouth daily.     warfarin (COUMADIN) 3 MG tablet     Take 1 tablet (3 mg total) by mouth daily.     Patient taking differently:  Take 2.5 mg by mouth daily.              Flagged for Removal             HYDROcodone-acetaminophen (NORCO) 5-325 MG per tablet     Take 2 tablets by mouth 2 (two) times  daily.              Review of Systems   Constitutional: Negative for activity change, appetite change, chills and fever.   HENT: Negative for congestion, ear pain, rhinorrhea and sore throat.    Eyes: Negative for visual disturbance.   Respiratory: Negative for cough and shortness of breath.    Cardiovascular: Negative for chest pain.   Gastrointestinal: Negative for abdominal pain, diarrhea, nausea and vomiting.   Musculoskeletal: Negative for arthralgias, back pain, neck pain and stiffness.   Skin: Negative for rash and wound.   Neurological: Negative for numbness and headaches.       Physical Exam    BP: 140/84, Heart Rate: 74, Temp: 97.3 F (36.3 C), Resp Rate: 16, SpO2: 96 %, Weight: 74.8 kg    Physical Exam   Constitutional: She is oriented to person, place, and time. She appears well-developed and well-nourished. She appears distressed.   Musculoskeletal: She exhibits tenderness.        Legs:  Neurological: She is alert and oriented to person, place, and time. No sensory deficit. She exhibits normal muscle tone.   Skin: Skin is warm. Capillary refill takes less than 2 seconds. She is not diaphoretic. No erythema.   Psychiatric: She has a normal mood and affect. Her behavior is normal.   Nursing note and vitals reviewed.        MDM and ED Course  ED Medication Orders     None             MDM      ED Course        The area was cleansed and then open with 22 gauge and the blood expressed along with some clotted material      Procedures    Clinical Impression & Disposition     Clinical Impression  Final diagnoses:   Blood blister   Hematoma        ED Disposition     ED Disposition Condition Date/Time Comment    Discharge  Mon Jul 17, 2016 11:39 AM [name] discharge to home/self care.    Condition at disposition: Stable           New Prescriptions    No medications on file                 Chucky May, MD  07/17/16 1150

## 2016-07-17 NOTE — ED Triage Notes (Signed)
Lt lower leg pain and dark purple discoloration with 1" blood blister looking lesion on mid shin.  Pt collided with her big dog mARCH 9 and pt states this was the injury that caused the hematomat which was much larger last week.Over this time period, pt states pain intermittently worse and better.

## 2016-07-17 NOTE — ED Notes (Addendum)
Pt on stretcher as Dr Freida Busman and PA studen, [facility], attempt to evacuate Lt shin blood blister using large gage needles.  Pt tolerating well without c/o pain.

## 2016-08-01 ENCOUNTER — Ambulatory Visit
Admission: RE | Admit: 2016-08-01 | Discharge: 2016-08-01 | Disposition: A | Discharge status: Home / Self Care | Payer: Medicare Other | Source: Ambulatory Visit | Attending: Internal Medicine | Admitting: Internal Medicine

## 2016-08-01 ENCOUNTER — Other Ambulatory Visit: Payer: Self-pay | Admitting: Internal Medicine

## 2016-08-01 DIAGNOSIS — L0390 Cellulitis, unspecified: Secondary | ICD-10-CM

## 2016-08-01 DIAGNOSIS — B9689 Other specified bacterial agents as the cause of diseases classified elsewhere: Secondary | ICD-10-CM | POA: Insufficient documentation

## 2016-08-01 DIAGNOSIS — L97821 Non-pressure chronic ulcer of other part of left lower leg limited to breakdown of skin: Secondary | ICD-10-CM | POA: Insufficient documentation

## 2016-09-13 ENCOUNTER — Ambulatory Visit: Payer: Self-pay

## 2016-10-11 ENCOUNTER — Other Ambulatory Visit (RURAL_HEALTH_CENTER): Payer: Self-pay | Admitting: Internal Medicine

## 2016-10-23 ENCOUNTER — Other Ambulatory Visit
Admission: RE | Admit: 2016-10-23 | Discharge: 2016-10-23 | Disposition: A | Discharge status: Home / Self Care | Payer: Medicare Other | Source: Ambulatory Visit | Attending: Internal Medicine | Admitting: Internal Medicine

## 2016-10-23 LAB — LIPID PANEL
Cholesterol: 152 mg/dL (ref 75–199)
Coronary Heart Disease Risk: 2.45
HDL: 62 mg/dL (ref 45–65)
LDL Calculated: 74 mg/dL
Triglycerides: 82 mg/dL (ref 10–150)
VLDL: 16 (ref 0–40)

## 2016-10-23 LAB — COMPREHENSIVE METABOLIC PANEL
ALT: 16 U/L (ref 0–55)
AST (SGOT): 23 U/L (ref 10–42)
Albumin/Globulin Ratio: 1.83 Ratio — ABNORMAL HIGH (ref 0.70–1.50)
Albumin: 4.2 gm/dL (ref 3.5–5.0)
Alkaline Phosphatase: 133 U/L (ref 40–145)
Anion Gap: 16.1 mMol/L (ref 7.0–18.0)
BUN / Creatinine Ratio: 29.5 Ratio (ref 10.0–30.0)
BUN: 31 mg/dL — ABNORMAL HIGH (ref 7–22)
Bilirubin, Total: 0.9 mg/dL (ref 0.1–1.2)
CO2: 29.4 mMol/L (ref 20.0–30.0)
Calcium: 10.8 mg/dL — ABNORMAL HIGH (ref 8.5–10.5)
Chloride: 98 mMol/L (ref 98–110)
Creatinine: 1.05 mg/dL (ref 0.60–1.20)
EGFR: 51 mL/min/{1.73_m2} — ABNORMAL LOW (ref 60–150)
Globulin: 2.3 gm/dL (ref 2.0–4.0)
Glucose: 98 mg/dL (ref 71–99)
Osmolality Calc: 284 mOsm/kg (ref 275–300)
Potassium: 4.5 mMol/L (ref 3.5–5.3)
Protein, Total: 6.5 gm/dL (ref 6.0–8.3)
Sodium: 139 mMol/L (ref 136–147)

## 2016-10-23 LAB — CBC AND DIFFERENTIAL
Basophils %: 0.5 % (ref 0.0–3.0)
Basophils Absolute: 0 10*3/uL (ref 0.0–0.3)
Eosinophils %: 0.6 % (ref 0.0–7.0)
Eosinophils Absolute: 0 10*3/uL (ref 0.0–0.8)
Hematocrit: 41.7 % (ref 36.0–48.0)
Hemoglobin: 12.6 gm/dL (ref 12.0–16.0)
Lymphocytes Absolute: 2 10*3/uL (ref 0.6–5.1)
Lymphocytes: 28.5 % (ref 15.0–46.0)
MCH: 30 pg (ref 28–35)
MCHC: 30 gm/dL — ABNORMAL LOW (ref 32–36)
MCV: 98 fL (ref 80–100)
MPV: 9.1 fL (ref 6.0–10.0)
Monocytes Absolute: 1 10*3/uL (ref 0.1–1.7)
Monocytes: 13.5 % (ref 3.0–15.0)
Neutrophils %: 56.9 % (ref 42.0–78.0)
Neutrophils Absolute: 4 10*3/uL (ref 1.7–8.6)
PLT CT: 125 10*3/uL — ABNORMAL LOW (ref 130–440)
RBC: 4.25 10*6/uL (ref 3.80–5.00)
RDW: 13.1 % (ref 11.0–14.0)
WBC: 7 10*3/uL (ref 4.0–11.0)

## 2016-10-23 LAB — PT/INR
PT INR: 1.6 — ABNORMAL HIGH (ref 0.5–1.3)
PT: 15.8 s — ABNORMAL HIGH (ref 9.5–11.5)

## 2017-01-24 ENCOUNTER — Ambulatory Visit: Payer: Self-pay

## 2017-02-15 ENCOUNTER — Other Ambulatory Visit
Admission: RE | Admit: 2017-02-15 | Discharge: 2017-02-15 | Disposition: A | Discharge status: Home / Self Care | Payer: Medicare Other | Source: Ambulatory Visit | Attending: Internal Medicine | Admitting: Internal Medicine

## 2017-02-15 LAB — COMPREHENSIVE METABOLIC PANEL
ALT: 25 U/L (ref 0–55)
AST (SGOT): 25 U/L (ref 10–42)
Albumin/Globulin Ratio: 2.16 Ratio — ABNORMAL HIGH (ref 0.70–1.50)
Albumin: 4.1 gm/dL (ref 3.5–5.0)
Alkaline Phosphatase: 117 U/L (ref 40–145)
Anion Gap: 12.4 mMol/L (ref 7.0–18.0)
BUN / Creatinine Ratio: 43.8 Ratio — ABNORMAL HIGH (ref 10.0–30.0)
BUN: 42 mg/dL — ABNORMAL HIGH (ref 7–22)
Bilirubin, Total: 0.8 mg/dL (ref 0.1–1.2)
CO2: 26.8 mMol/L (ref 20.0–30.0)
Calcium: 10.3 mg/dL (ref 8.5–10.5)
Chloride: 105 mMol/L (ref 98–110)
Creatinine: 0.96 mg/dL (ref 0.60–1.20)
EGFR: 57 mL/min/{1.73_m2} — ABNORMAL LOW (ref 60–150)
Globulin: 1.9 gm/dL — ABNORMAL LOW (ref 2.0–4.0)
Glucose: 98 mg/dL (ref 71–99)
Osmolality Calc: 290 mOsm/kg (ref 275–300)
Potassium: 4.2 mMol/L (ref 3.5–5.3)
Protein, Total: 6 gm/dL (ref 6.0–8.3)
Sodium: 140 mMol/L (ref 136–147)

## 2017-02-15 LAB — CBC AND DIFFERENTIAL
Basophils %: 0.5 % (ref 0.0–3.0)
Basophils Absolute: 0 10*3/uL (ref 0.0–0.3)
Eosinophils %: 1.1 % (ref 0.0–7.0)
Eosinophils Absolute: 0.1 10*3/uL (ref 0.0–0.8)
Hematocrit: 39.7 % (ref 36.0–48.0)
Hemoglobin: 12.9 gm/dL (ref 12.0–16.0)
Lymphocytes Absolute: 1.5 10*3/uL (ref 0.6–5.1)
Lymphocytes: 21.6 % (ref 15.0–46.0)
MCH: 32 pg (ref 28–35)
MCHC: 33 gm/dL (ref 32–36)
MCV: 99 fL (ref 80–100)
MPV: 9.1 fL (ref 6.0–10.0)
Monocytes Absolute: 0.7 10*3/uL (ref 0.1–1.7)
Monocytes: 9.3 % (ref 3.0–15.0)
Neutrophils %: 67.5 % (ref 42.0–78.0)
Neutrophils Absolute: 4.7 10*3/uL (ref 1.7–8.6)
PLT CT: 142 10*3/uL (ref 130–440)
RBC: 4.03 10*6/uL (ref 3.80–5.00)
RDW: 13.9 % (ref 11.0–14.0)
WBC: 7 10*3/uL (ref 4.0–11.0)

## 2017-02-15 LAB — PT/INR
PT INR: 3 — ABNORMAL HIGH (ref 0.5–1.3)
PT: 28.8 s — ABNORMAL HIGH (ref 9.5–11.5)

## 2017-02-15 LAB — LIPID PANEL
Cholesterol: 138 mg/dL (ref 75–199)
Coronary Heart Disease Risk: 2.51
HDL: 55 mg/dL (ref 45–65)
LDL Calculated: 71 mg/dL
Triglycerides: 59 mg/dL (ref 10–150)
VLDL: 12 (ref 0–40)

## 2017-02-21 ENCOUNTER — Other Ambulatory Visit
Admission: RE | Admit: 2017-02-21 | Discharge: 2017-02-21 | Disposition: A | Discharge status: Home / Self Care | Payer: Medicare Other | Source: Ambulatory Visit | Attending: Internal Medicine | Admitting: Internal Medicine

## 2017-02-21 LAB — VH URINE DRUG SCREEN
Amphetamine: POSITIVE — AB
Barbiturates: NEGATIVE
Buprenorphine, Urine: NEGATIVE
Cannabinoids: NEGATIVE
Cocaine: NEGATIVE
Opiates: POSITIVE — AB
Phencyclidine: NEGATIVE
Urine Benzodiazepines: POSITIVE — AB
Urine Creatinine Random: 123.72 mg/dL
Urine Ecstasy Screen: NEGATIVE
Urine Fentanyl Screen: POSITIVE — AB
Urine Methadone Screen: NEGATIVE
Urine Oxycodone: NEGATIVE
Urine Specific Gravity: 1.022 (ref 1.001–1.040)
pH, Urine: 4.8 pH — ABNORMAL LOW (ref 5.0–8.0)

## 2017-03-14 ENCOUNTER — Telehealth: Payer: Self-pay

## 2017-03-14 NOTE — Telephone Encounter (Signed)
Faxed request to pcp for ov, labs

## 2017-03-14 NOTE — Progress Notes (Signed)
.D: 03/07/16 : 01:26pm  .T: TRANSCRIBED FOLLOW-UP     [facility]  [address]  [place], [place] [address]  PHONE: [phone]  FAX: [phone]  OFFICE NOTE    PATIENT:  [name]     DATE: 03/07/2016  DOB: 1940/05/26                  MRN:  [id number]    HISTORY OF PRESENT ILLNESS:  The patient still sees Dr. Fransisca Kaufmann.  I saw her one year ago.  She had a hospitalization in March, at [facility].  She looks good at age.  She wears chronic oxygen.  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 Dr. Fransisca Kaufmann handles her Coumadin.  She can stop Coumadin temporarily if needed to.  I initially put her pacemaker in 13 years ago for sick sinus syndrome.  She has gone on to have chronic AFib.  I replaced her generator about a year ago. She is not pacemaker dependent.  She paces 70% of the time.  Sensing, impedance, and thresholds are fine.    PHYSICAL EXAMINATION:  VITAL SIGNS:  Her blood pressure is 122/76, heart rate 68.  LUNGS:  Her lungs actually demonstrated crackles.  CHEST:  Pacemaker site is well-healed.  CARDIAC:  Regular rhythm.  EXTREMITIES:  No lower extremity edema.    Chief Complaint/Reason for Consult: Annual w/Device Check     Medical History:       1.  Nonobstructive coronary artery disease by catheterization; 2008 Dr. Duffy Rhody  2.  Acute on chronic congestive heart failure, unspecified congestive heart failure type     a)  Echo 05/04/06- Mild AR. Mild TR with estimated RVSP    b)  Echo 03/23/11- Mild MR and AR. Moderate TR. Normal LV systolic function. LV concentric hypertrophy. Moderate elevation of RVSP  3.  Paroxysmal atrial fibrillation    a)  Echo 12/28/10- LV normal size and systolic function. mild concentric hypertrophy. Mild LAE. RV size normal with normal systolic function. RA normal size. Aortic root mildly enlarged.     b) Echo 11/06/13- LV concentric hypertrophy. Normal LV systolic function. Mild-moderate AR. Severe TR. Slightly  elevated estimated RVSP  4.  Hyperkalemia   5.  Glaucoma, right eye  6.  On home oxygen therapy   7. Aortic regurgitation   8.  SSS, AFib w RVR, symptomatic  bradycardia    a)  S/p MDT dual chamber pacer placed by Dr. Pecola Leisure 04/03/2001, left pect.    b)  S/p gen change 03/22/15  9.  Acquired kyphoscoliosis  10. Degenerative disc disease; deformity of thoracic vertebra  11.  Urge incontinence of urine  12.  Osteoporosis  13.  History of preserved left ventricular function    a)  Echo 11/06/13- Concentric LVH. Normal LV systolic function. Mild-moderate AR. Severe TR. Slightly elevated estimated RVSP.  14.  Dizziness    a)  Tilt table 05/03/06- Negative study for neurocardiogenic syncope     Surgical / Procedural History:  1.  Retinal surgery, Dr. Louellen Molder 2008  2.  Colonoscopy polyps 06/2007, Dr. Lynnea Maizes    Most Recent Ejection Fraction: 50 % by Echo on  11/06/2013     Review of Systems: As per HPI, all other systems negative.  See Cardio Intake form, that has been scanned into the chart.    Allergies:     NKDA, Adhesive, Codeine, Morphine, Nitroglycerin, Pain Patch  Allergy history last updated on 03/07/16  Current Medications:       Rx: CLONAZEPAM 0.5MG  1 Tablet twice daily PRN   Rx: COUMADIN 5MG  Tablet daily AS DIRECTED   Rx: CRESTOR 10MG  1 Tablet daily   Rx: DUONEB 3/0.5MG  Nebulize Solution EVERY SIX HOURS   Rx: LASIX 40MG  1 Tablet twice daily   Rx: ATENOLOL 100MG  1 Tablet DAILY   Rx: DILTIAZEM HCL ER BEADS 240MG  1 Capsule twice daily   Rx: PRILOSEC OTC 20MG  1 Capsule daily   Rx: SPIRONOLACTONE 50MG  1 Tablet daily   Rx: BENZONATE 200mg  1 twice each day   Rx: CARBIDOPA-LEVODOPA 25-100MG  1 Tablet twice each day   Rx: ESTRACE 0.1mg  3xs weekly     Patient presented Rx by: Verbal  nd    Family History (FHx)     Mother      Hypertension  Father      Hypertension  Sister      Hypertension  Brother      Hypertension  Daughter      Hypertension  Son      Hypertension     Social History (SHx)     Alcohol Consumption: None    Caffeine Intake: None  Exercise: None  z.end    Smoking Status is recorded today as Never smoker     02/11/16- Chol 154, Trig 79, HDL 53, LDL 85, Na 138, K 4.3, Ca 54.0, Gluc 98, Creat 0.96, BUN 33, Albumin 4.0, LFTs normal, GFR 57    10/18/15- WBC 6.5, HGB 12.5, HCT 40.7, PLT 104    07/19/15- BNP 1171.3    The patient was given the following instructions during the current visit:  Please take your medications as instructed and call if you have any side effects. Don't forget to bring your meds to the next visit.    IMPRESSION:  1.  History of preserved left ventricular function.  2.  Nonobstructive coronary artery disease by catheterization nine years ago.  3.  Chronic atrial fibrillation on Coumadin.  4.  Pacemaker 14 years ago.   Recent generator change functioning well.    PLAN:  I have made no changes.  She is doing well.  She will have three months remote checks and I will see her back in Device Clinic in one year.    ____________________________, [place]. Pecola Leisure, MD    Dictated:  03/07/2016/DBR             Transcribed:  03/08/2016/islv/icka             Amended:     CC:   Ludger Nutting, MD        #        SIGNED BY Herbert Deaner, MD, Dr.  (RDB)       03/08/2016 01:33PM

## 2017-03-16 NOTE — Telephone Encounter (Signed)
2nd req. refaxed pcp for lov//bw

## 2017-03-20 ENCOUNTER — Ambulatory Visit: Payer: Medicare Other

## 2017-03-20 ENCOUNTER — Ambulatory Visit: Payer: Medicare Other | Admitting: Interventional Cardiology

## 2017-03-20 ENCOUNTER — Encounter: Payer: Self-pay | Admitting: Interventional Cardiology

## 2017-03-20 VITALS — BP 150/90 | HR 60 | Ht 63.0 in | Wt 171.8 lb

## 2017-03-20 DIAGNOSIS — I480 Paroxysmal atrial fibrillation: Secondary | ICD-10-CM

## 2017-03-20 DIAGNOSIS — I482 Chronic atrial fibrillation, unspecified: Secondary | ICD-10-CM

## 2017-03-20 DIAGNOSIS — I495 Sick sinus syndrome: Secondary | ICD-10-CM

## 2017-03-20 NOTE — Progress Notes (Signed)
Cardiology Follow-Up      Patient Name: [name]   Date of Birth: 19-Oct-1940    Provider: Herbert Deaner, MD     Patient Care Team:  Ladona Horns, MD as PCP - General (Internal Medicine)    Chief Complaint: Atrial Fibrillation; Coronary Artery Disease; and SSS      History of Present Illness   [name] is a 76 y.o. female being seen today for F/U of sick sinus syndrome and atrial fibrillation. I initially placed her pacemaker 16 years ago. She was admitted for atrial fibrillation this past spring, which was related to a COPD exacerbation. She is still wearing oxygen. She is feeling well. She lives with her daughter. Medications reviewed and appropriate. She remains on Coumadin. INR is checked regularly.     Device interrogated today. She is in chronic afib. Lead is fine. She paces 58% of the time. She has good rate control. See report for additional details.       Review of Systems   Constitution: Negative for activity change, appetite change, fatigue, fever, and unexpected weight change  HENT: Negative for trouble swallowing, and voice change  Eyes: Negative for any visual disturbances  Respiratory: Negative for apnea and/or awakening with sob, chest tightness, and sob  Cardiovascular: Negative for chest pain/discomfort, leg or ankle swelling, difficulty breathing lying down, and palpitations  Gastrointestinal: Negative for abdominal pain, blood in stool, nausea, and vomiting  Endocrine: Negative for cold intolerance, heat intolerance, and polydipsia  GU: Negative for flank pain, frequent urination, and hematuria  Musculoskeletal: Negative for gait problems, joint swelling, neck pain, and pain or heaviness in legs  Skin: Negative for rash, wound, and ulcers on legs/feet  Neurological: Negative for dizziness, headaches, and syncope  Hematologic: Negative for bruises  Psychiatric: Negative for anxiety, and sleep disturbance    Comprehensive review of systems performed by me. Unless otherwise  noted, all systems are negative.    Past Medical History     Past Medical History - Preloaded for DBR 03/20/17 by DP    1.  Nonobstructive coronary artery disease by catheterization; 2008 Dr. Duffy Rhody  2.  Acute on chronic congestive heart failure, unspecified congestive heart failure type     a)  Echo 05/04/06- Mild AR. Mild TR with estimated RVSP    b)  Echo 03/23/11- Mild MR and AR. Moderate TR. Normal LV systolic function. LV concentric hypertrophy. Moderate elevation of RVSP  3.  Paroxysmal atrial fibrillation    a)  Echo 12/28/10- LV normal size and systolic function. mild concentric hypertrophy. Mild LAE. RV size normal with normal systolic function. RA normal size. Aortic root mildly enlarged.     b) Echo 11/06/13- LV concentric hypertrophy. Normal LV systolic function. Mild-moderate AR. Severe TR. Slightly elevated estimated RVSP  4.  Hyperkalemia   5.  Glaucoma, right eye  6.  On home oxygen therapy   7. Aortic regurgitation   8.  SSS, AFib w RVR, symptomatic  bradycardia    a)  S/p MDT dual chamber pacer placed by Dr. Pecola Leisure 04/03/2001, left pect.    b)  S/p gen change 03/22/15  9.  Acquired kyphoscoliosis  10. Degenerative disc disease; deformity of thoracic vertebra  11.  Urge incontinence of urine  12.  Osteoporosis  13.  History of preserved left ventricular function    a)  Echo 11/06/13- Concentric LVH. Normal LV systolic function. Mild-moderate AR. Severe TR. Slightly elevated estimated RVSP.  14.  Dizziness  a)  Tilt table 05/03/06- Negative study for neurocardiogenic syncope    HPI from 03/07/16: The patient still sees Dr. Fransisca Kaufmann.  I saw her one year ago.  She had a hospitalization in March, at [facility].  She looks good at age.  She wears chronic oxygen.  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 Dr. Fransisca Kaufmann handles her Coumadin.  She can stop Coumadin temporarily if needed to.  I initially put her pacemaker in 13 years ago for sick sinus syndrome.  She has gone on to have chronic AFib.  I  replaced her generator about a year ago. She is not pacemaker dependent.  She paces 70% of the time.  Sensing, impedance, and thresholds are fine.    Plan from 03/07/16: I have made no changes.  She is doing well.  She will have three months remote checks and I will see her back in Device Clinic in one year.    Past Surgical History     Past Surgical History:   Procedure Laterality Date   . CARDIAC PACEMAKER PLACEMENT      2002   . HYSTERECTOMY         Family History     Family History   Problem Relation Age of Onset   . Breast cancer Sister        Social History     Social History   Substance Use Topics   . Smoking status: Never Smoker   . Smokeless tobacco: Never Used   . Alcohol use No       Allergies     Allergies   Allergen Reactions   . Adhesive [Wound Dressing Adhesive]    . Codeine    . Morphine    . Nitroglycerin    . Pain Patch [Menthol]        Medications     Current Outpatient Prescriptions   Medication Sig   . albuterol (PROVENTIL) (2.5 MG/3ML) 0.083% nebulizer solution Take 2.5 mg by nebulization every 6 (six) hours as needed for Wheezing.   Marland Kitchen atenolol (TENORMIN) 100 MG tablet Take 100 mg by mouth daily.   . benzonatate (TESSALON) 200 MG capsule Take 200 mg by mouth 3 (three) times daily as needed.   . carbidopa-levodopa (SINEMET) 25-100 MG per tablet Take 1 tablet by mouth 2 (two) times daily.   . clonazePAM (KLONOPIN) 0.5 MG tablet Take 0.5 mg by mouth 2 (two) times daily as needed.   . diltiazem (TIAZAC) 240 MG 24 hr capsule Take 240 mg by mouth 2 (two) times daily.      Marland Kitchen docusate sodium (COLACE) 100 MG capsule Take 100 mg by mouth daily.   Marland Kitchen estradiol (ESTRACE) 0.1 MG/GM vaginal cream Place 2 g vaginally daily.   . fluorometholone (EFLONE) 0.1 % ophthalmic suspension 1 drop 4 (four) times daily.   . furosemide (LASIX) 40 MG tablet TAKE 1 TABLET BY MOUTH TWO TIMES DAILY ON AN EMPTY STOMACH   . HYDROcodone-acetaminophen (NORCO) 5-325 MG per tablet Take 2 tablets by mouth 2 (two) times daily.      Marland Kitchen  ketoconazole (NIZORAL) 2 % cream Apply topically daily.   Marland Kitchen latanoprost (XALATAN) 0.005 % ophthalmic solution 1 drop nightly.   Marland Kitchen omeprazole (PRILOSEC) 20 MG capsule Take 20 mg by mouth daily.   . potassium chloride (KLOR-CON) 20 MEQ packet Take 20 mEq by mouth daily.       . rosuvastatin (CRESTOR) 10 MG tablet Take 10 mg by mouth daily.   Marland Kitchen  spironolactone (ALDACTONE) 50 MG tablet Take 50 mg by mouth daily.   Marland Kitchen warfarin (COUMADIN) 3 MG tablet Take 1 tablet (3 mg total) by mouth daily. (Patient taking differently: Take 2.5 mg by mouth daily.   )       Physical Exam     Visit Vitals  BP 150/90 (BP Site: Left arm, Patient Position: Sitting)   Pulse 60   Ht 1.6 m (5\' 3" )   Wt 77.9 kg (171 lb 12.8 oz)   BMI 30.43 kg/m     Vitals:    03/20/17 1127   BP: 150/90   BP Site: Left arm   Patient Position: Sitting   Pulse: 60   Weight: 77.9 kg (171 lb 12.8 oz)   Height: 1.6 m (5\' 3" )     Wt Readings from Last 3 Encounters:   03/20/17 77.9 kg (171 lb 12.8 oz)   07/17/16 74.8 kg (165 lb)   06/26/16 73.5 kg (162 lb)        Constitutional -  Well appearing, and in no distress  Eyes - Pupils equal and reactive, extraocular eye movements intact, sclera anicteric  Oropharynx - Moist mucous membranes  Respiratory - Clear to auscultation bilaterally, normal respiratory effort    Cardiovascular system -    Regular rate and rhythm    Normal S1, S2    No murmurs, rubs, gallops   Jugular venous pulse is normal        Carotid upstroke normal, no carotid bruits auscultated   2+ pulses in the posterior tibial / dorsalis pedis bilaterally    Neurological - Alert, oriented, no focal neurological deficits  Extremities -  No clubbing or cyanosis. No peripheral edema  Skin - Warm and dry, no rashes  Psych- Appropriate affect    Labs     Lab Results   Component Value Date/Time    WBC 7.0 02/15/2017 09:31 AM    RBC 4.03 02/15/2017 09:31 AM    HGB 12.9 02/15/2017 09:31 AM    HCT 39.7 02/15/2017 09:31 AM    PLT 142 02/15/2017 09:31 AM    TSH 0.12 (L)  11/07/2013 05:54 AM       Lab Results   Component Value Date/Time    NA 140 02/15/2017 09:31 AM    K 4.2 02/15/2017 09:31 AM    CL 105 02/15/2017 09:31 AM    CO2 26.8 02/15/2017 09:31 AM    GLU 98 02/15/2017 09:31 AM    BUN 42 (H) 02/15/2017 09:31 AM    CREAT 0.96 02/15/2017 09:31 AM    PROT 6.0 02/15/2017 09:31 AM    ALKPHOS 117 02/15/2017 09:31 AM    AST 25 02/15/2017 09:31 AM    ALT 25 02/15/2017 09:31 AM       Lab Results   Component Value Date/Time    CHOL 138 02/15/2017 09:31 AM    TRIG 59 02/15/2017 09:31 AM    HDL 55 02/15/2017 09:31 AM    LDL 71 02/15/2017 09:31 AM       No results found for: [id number]    Cardiogenics:     EKG: N/A    Impression and Recommendations:     1. SSS (sick sinus syndrome)    2. PAF (paroxysmal atrial fibrillation)    -No changes  -F/U with me in the device clinic in one year. Remote check in 3, 6, and 9 months     Herbert Deaner, MD   03/20/2017

## 2017-03-28 NOTE — Progress Notes (Signed)
Patient meets preliminary criteria as a [employer] beneficiary. Beneficiary notice letter given to patient during today's appointment.     Lev Cervone, RN

## 2017-04-25 ENCOUNTER — Inpatient Hospital Stay
Admission: EM | Admit: 2017-04-25 | Discharge: 2017-04-27 | DRG: 291 | Disposition: A | Discharge status: Home / Self Care | Payer: Medicare Other | Source: Ambulatory Visit | Attending: Internal Medicine | Admitting: Internal Medicine

## 2017-04-25 ENCOUNTER — Emergency Department: Payer: Medicare Other

## 2017-04-25 DIAGNOSIS — I482 Chronic atrial fibrillation: Secondary | ICD-10-CM | POA: Diagnosis present

## 2017-04-25 DIAGNOSIS — K219 Gastro-esophageal reflux disease without esophagitis: Secondary | ICD-10-CM | POA: Diagnosis present

## 2017-04-25 DIAGNOSIS — I5033 Acute on chronic diastolic (congestive) heart failure: Secondary | ICD-10-CM | POA: Diagnosis present

## 2017-04-25 DIAGNOSIS — Z803 Family history of malignant neoplasm of breast: Secondary | ICD-10-CM

## 2017-04-25 DIAGNOSIS — Z7901 Long term (current) use of anticoagulants: Secondary | ICD-10-CM

## 2017-04-25 DIAGNOSIS — Z2889 Immunization not carried out for other reason: Secondary | ICD-10-CM

## 2017-04-25 DIAGNOSIS — J449 Chronic obstructive pulmonary disease, unspecified: Secondary | ICD-10-CM | POA: Diagnosis present

## 2017-04-25 DIAGNOSIS — I351 Nonrheumatic aortic (valve) insufficiency: Secondary | ICD-10-CM | POA: Diagnosis present

## 2017-04-25 DIAGNOSIS — Z95810 Presence of automatic (implantable) cardiac defibrillator: Secondary | ICD-10-CM

## 2017-04-25 DIAGNOSIS — I4821 Permanent atrial fibrillation: Secondary | ICD-10-CM | POA: Diagnosis present

## 2017-04-25 DIAGNOSIS — Z9981 Dependence on supplemental oxygen: Secondary | ICD-10-CM

## 2017-04-25 DIAGNOSIS — Z885 Allergy status to narcotic agent status: Secondary | ICD-10-CM

## 2017-04-25 DIAGNOSIS — J9621 Acute and chronic respiratory failure with hypoxia: Secondary | ICD-10-CM | POA: Diagnosis present

## 2017-04-25 DIAGNOSIS — M810 Age-related osteoporosis without current pathological fracture: Secondary | ICD-10-CM | POA: Diagnosis present

## 2017-04-25 DIAGNOSIS — I509 Heart failure, unspecified: Secondary | ICD-10-CM

## 2017-04-25 DIAGNOSIS — Z90710 Acquired absence of both cervix and uterus: Secondary | ICD-10-CM

## 2017-04-25 DIAGNOSIS — H409 Unspecified glaucoma: Secondary | ICD-10-CM | POA: Diagnosis present

## 2017-04-25 DIAGNOSIS — I480 Paroxysmal atrial fibrillation: Secondary | ICD-10-CM | POA: Diagnosis present

## 2017-04-25 DIAGNOSIS — R0789 Other chest pain: Secondary | ICD-10-CM | POA: Diagnosis not present

## 2017-04-25 DIAGNOSIS — I5032 Chronic diastolic (congestive) heart failure: Secondary | ICD-10-CM | POA: Diagnosis present

## 2017-04-25 DIAGNOSIS — Z91048 Other nonmedicinal substance allergy status: Secondary | ICD-10-CM

## 2017-04-25 DIAGNOSIS — M351 Other overlap syndromes: Secondary | ICD-10-CM | POA: Diagnosis present

## 2017-04-25 DIAGNOSIS — M419 Scoliosis, unspecified: Secondary | ICD-10-CM | POA: Diagnosis present

## 2017-04-25 DIAGNOSIS — I2510 Atherosclerotic heart disease of native coronary artery without angina pectoris: Secondary | ICD-10-CM | POA: Diagnosis present

## 2017-04-25 DIAGNOSIS — I110 Hypertensive heart disease with heart failure: Principal | ICD-10-CM | POA: Diagnosis present

## 2017-04-25 DIAGNOSIS — R0689 Other abnormalities of breathing: Secondary | ICD-10-CM | POA: Diagnosis present

## 2017-04-25 LAB — COMPREHENSIVE METABOLIC PANEL
ALT: 5 U/L (ref 0–55)
AST (SGOT): 29 U/L (ref 10–42)
Albumin/Globulin Ratio: 1.67 Ratio — ABNORMAL HIGH (ref 0.70–1.50)
Albumin: 4.1 gm/dL (ref 3.5–5.0)
Alkaline Phosphatase: 115 U/L (ref 40–145)
Anion Gap: 13.4 mMol/L (ref 7.0–18.0)
BUN / Creatinine Ratio: 27.1 Ratio (ref 10.0–30.0)
BUN: 32 mg/dL — ABNORMAL HIGH (ref 7–22)
Bilirubin, Total: 0.9 mg/dL (ref 0.1–1.2)
CO2: 26 mMol/L (ref 20.0–30.0)
Calcium: 10.8 mg/dL — ABNORMAL HIGH (ref 8.5–10.5)
Chloride: 104 mMol/L (ref 98–110)
Creatinine: 1.18 mg/dL (ref 0.60–1.20)
EGFR: 44 mL/min/{1.73_m2} — ABNORMAL LOW (ref 60–150)
Globulin: 2.4 gm/dL (ref 2.0–4.0)
Glucose: 133 mg/dL — ABNORMAL HIGH (ref 71–99)
Osmolality Calc: 286 mOsm/kg (ref 275–300)
Potassium: 4.4 mMol/L (ref 3.5–5.3)
Protein, Total: 6.5 gm/dL (ref 6.0–8.3)
Sodium: 139 mMol/L (ref 136–147)

## 2017-04-25 LAB — CBC AND DIFFERENTIAL
Basophils %: 0.8 % (ref 0.0–3.0)
Basophils Absolute: 0 10*3/uL (ref 0.0–0.3)
Eosinophils %: 0.2 % (ref 0.0–7.0)
Eosinophils Absolute: 0 10*3/uL (ref 0.0–0.8)
Hematocrit: 39 % (ref 36.0–48.0)
Hemoglobin: 12.6 gm/dL (ref 12.0–16.0)
Lymphocytes Absolute: 0.9 10*3/uL (ref 0.6–5.1)
Lymphocytes: 14.4 % — ABNORMAL LOW (ref 15.0–46.0)
MCH: 31 pg (ref 28–35)
MCHC: 32 gm/dL (ref 31–36)
MCV: 96 fL (ref 80–100)
MPV: 6.8 fL (ref 6.0–10.0)
Monocytes Absolute: 0.6 10*3/uL (ref 0.1–1.7)
Monocytes: 9.8 % (ref 3.0–15.0)
Neutrophils %: 74.8 % (ref 42.0–78.0)
Neutrophils Absolute: 4.9 10*3/uL (ref 1.7–8.6)
PLT CT: 88 10*3/uL — ABNORMAL LOW (ref 130–440)
RBC: 4.04 10*6/uL (ref 3.80–5.00)
RDW: 12.1 % (ref 10.5–14.5)
WBC: 6.5 10*3/uL (ref 4.0–11.0)

## 2017-04-25 LAB — VH URINALYSIS WITH MICROSCOPIC IF INDICATED
Bilirubin, UA: NEGATIVE mg/dL
Blood, UA: NEGATIVE mg/dL
Glucose, UA: NEGATIVE mg/dL
Ketones UA: NEGATIVE mg/dL
Leukocyte Esterase, UA: NEGATIVE Leu/uL
Nitrite, UA: NEGATIVE
Protein, UR: NEGATIVE mg/dL
Urine Specific Gravity: <=1.005 (ref 1.001–1.040)
Urobilinogen, UA: 0.2 mg/dL
pH, Urine: 5.5 pH (ref 5.0–8.0)

## 2017-04-25 LAB — PT AND APTT
PT INR: 2.6 — ABNORMAL HIGH (ref 0.8–1.2)
PT: 26.2 s — ABNORMAL HIGH (ref 9.4–12.5)
aPTT: 39.2 s — ABNORMAL HIGH (ref 22.0–35.0)

## 2017-04-25 LAB — TROPONIN I
Troponin I: 0.02 ng/mL (ref 0.00–0.02)
Troponin I: 0.03 ng/mL — ABNORMAL HIGH (ref 0.00–0.02)
Troponin I: 0.03 ng/mL — ABNORMAL HIGH (ref 0.00–0.02)

## 2017-04-25 LAB — B-TYPE NATRIURETIC PEPTIDE: B-Natriuretic Peptide: 754.1 pg/mL — ABNORMAL HIGH (ref 0.0–100.0)

## 2017-04-25 MED ORDER — PANTOPRAZOLE SODIUM 40 MG PO TBEC
40.00 mg | DELAYED_RELEASE_TABLET | Freq: Every day | ORAL | Status: DC
Start: 2017-04-25 — End: 2017-04-27
  Administered 2017-04-26 – 2017-04-27 (×2): 40 mg via ORAL
  Filled 2017-04-25 (×2): qty 1

## 2017-04-25 MED ORDER — VH WARFARIN THERAPY PLACEHOLDER
1.00 | Status: DC
Start: 2017-04-25 — End: 2017-04-27

## 2017-04-25 MED ORDER — HYDROCODONE-ACETAMINOPHEN 5-325 MG PO TABS
2.00 | ORAL_TABLET | Freq: Two times a day (BID) | ORAL | Status: DC
Start: 2017-04-25 — End: 2017-04-27
  Administered 2017-04-25 – 2017-04-27 (×4): 2 via ORAL
  Filled 2017-04-25 (×4): qty 2

## 2017-04-25 MED ORDER — ALBUTEROL SULFATE (2.5 MG/3ML) 0.083% IN NEBU
2.50 mg | INHALATION_SOLUTION | Freq: Four times a day (QID) | RESPIRATORY_TRACT | Status: DC | PRN
Start: 2017-04-25 — End: 2017-04-27

## 2017-04-25 MED ORDER — VH DILTIAZEM HCL ER BEADS 120 MG PO CP24 (WRAP)
240.00 mg | ORAL_CAPSULE | Freq: Two times a day (BID) | ORAL | Status: DC
Start: 2017-04-25 — End: 2017-04-27
  Administered 2017-04-25 – 2017-04-27 (×4): 240 mg via ORAL
  Filled 2017-04-25 (×4): qty 2

## 2017-04-25 MED ORDER — FUROSEMIDE 10 MG/ML IJ SOLN
INTRAMUSCULAR | Status: AC
Start: 2017-04-25 — End: ?
  Filled 2017-04-25: qty 4

## 2017-04-25 MED ORDER — CARBIDOPA-LEVODOPA 25-100 MG PO TABS
1.00 | ORAL_TABLET | Freq: Two times a day (BID) | ORAL | Status: DC
Start: 2017-04-25 — End: 2017-04-27
  Administered 2017-04-25 – 2017-04-27 (×4): 1 via ORAL
  Filled 2017-04-25 (×4): qty 1

## 2017-04-25 MED ORDER — VH SPIRONOLACTONE 25 MG PO TABS
50.00 mg | ORAL_TABLET | Freq: Every day | ORAL | Status: DC
Start: 2017-04-25 — End: 2017-04-27
  Administered 2017-04-26 – 2017-04-27 (×2): 50 mg via ORAL
  Filled 2017-04-25 (×2): qty 2

## 2017-04-25 MED ORDER — ATENOLOL 50 MG PO TABS
50.00 mg | ORAL_TABLET | Freq: Every day | ORAL | Status: DC
Start: 2017-04-25 — End: 2017-04-27
  Administered 2017-04-26 – 2017-04-27 (×2): 50 mg via ORAL
  Filled 2017-04-25 (×2): qty 1

## 2017-04-25 MED ORDER — ROSUVASTATIN CALCIUM 10 MG PO TABS
10.00 mg | ORAL_TABLET | Freq: Every day | ORAL | Status: DC
Start: 2017-04-25 — End: 2017-04-27
  Administered 2017-04-26 – 2017-04-27 (×2): 10 mg via ORAL
  Filled 2017-04-25 (×2): qty 1

## 2017-04-25 MED ORDER — FUROSEMIDE 40 MG PO TABS
40.00 mg | ORAL_TABLET | Freq: Two times a day (BID) | ORAL | Status: DC
Start: 2017-04-25 — End: 2017-04-27
  Administered 2017-04-25 – 2017-04-27 (×4): 40 mg via ORAL
  Filled 2017-04-25 (×4): qty 1

## 2017-04-25 MED ORDER — WARFARIN SODIUM 2.5 MG PO TABS
2.50 mg | ORAL_TABLET | Freq: Every day | ORAL | Status: DC
Start: 2017-04-25 — End: 2017-04-27
  Administered 2017-04-25 – 2017-04-26 (×2): 2.5 mg via ORAL
  Filled 2017-04-25 (×2): qty 1

## 2017-04-25 MED ORDER — CLOTRIMAZOLE 1 % EX CREA
TOPICAL_CREAM | Freq: Two times a day (BID) | CUTANEOUS | Status: DC
Start: 2017-04-25 — End: 2017-04-27
  Filled 2017-04-25 (×2): qty 14.17

## 2017-04-25 MED ORDER — LATANOPROST 0.005 % OP SOLN
1.00 [drp] | Freq: Every evening | OPHTHALMIC | Status: DC
Start: 2017-04-25 — End: 2017-04-27
  Administered 2017-04-25 – 2017-04-26 (×2): 1 [drp] via OPHTHALMIC
  Filled 2017-04-25: qty 1

## 2017-04-25 MED ORDER — FUROSEMIDE 10 MG/ML IJ SOLN
40.0000 mg | Freq: Once | INTRAMUSCULAR | Status: AC
Start: 2017-04-25 — End: 2017-04-25
  Administered 2017-04-25: 12:00:00 40 mg via INTRAVENOUS

## 2017-04-25 MED ORDER — HYDROCODONE-ACETAMINOPHEN 5-325 MG PO TABS
2.00 | ORAL_TABLET | Freq: Two times a day (BID) | ORAL | Status: DC
Start: 2017-04-25 — End: 2017-04-25

## 2017-04-25 MED ORDER — PATIENT SUPPLIED NON FORMULARY
1.00 [drp] | Freq: Four times a day (QID) | Status: DC
Start: 2017-04-25 — End: 2017-04-27
  Administered 2017-04-26 – 2017-04-27 (×3): 1 [drp] via OPHTHALMIC

## 2017-04-25 NOTE — Progress Notes (Signed)
Report received, assumed care.  

## 2017-04-25 NOTE — UM Notes (Signed)
[facility] Utilization Management Review Sheet    Facility :  [facility]    NAME: [name]  MR#: [id number]    CSN#: [id number]    ROOM: 201/201-A AGE: 76 y.o.    START OF CARE DATE AND TIME: 04/25/2017 10:50 AM  ADMIT DATE AND TIME: 04/25/17 @ 1337    PATIENT CLASS: INPATIENT    ATTENDING PHYSICIAN: Graciela Husbands, DO  PAYOR:Payor: MEDICARE / Plan: MEDICARE PART A AND B / Product Type: Medicare /       AUTH #: [employer] MEDICARE    DIAGNOSIS: Active Problems:    Difficulty breathing        ICD-10-CM    1. Acute on chronic congestive heart failure, unspecified heart failure type I50.9        HISTORY:   Past Medical History:   Diagnosis Date   . Aortic aneurysm    . Atrial fibrillation    . Chronic obstructive pulmonary disease    . Coronary artery disease    . Gastroesophageal reflux disease    . Hypertension    . Malignant neoplasm    . Pacemaker    . SSS (sick sinus syndrome)        DATE OF REVIEW: 04/25/2017    VITALS: BP (!) 144/91   Pulse 73   Temp 97.4 F (36.3 C) (Tympanic)   Resp 22   Ht 1.626 m (5\' 4" )   Wt 77.6 kg (171 lb)   SpO2 99%   BMI 29.35 kg/m     Active Hospital Problems    Diagnosis   . Difficulty breathing     ER/UR NOTE 04/25/17:     76 yo female who presents to the ER with worsening dyspnea over the past 2 weeks. Patient is unable to ambulate more than 2 steps without becoming extremely short of breath. She is unable to speak in complete sentences. Patient is on chronic home oxygen. On assessment, patient has rales in RLL and LLL. While in the ER, patient received IV Lasix 40mg . Patient admitted to inpatient status with diagnosis of CHF with need for continued evaluation, telemetry and IV diuretics.    VITALS: T 97.4, HR 69, R 25, BP 139/64(AS HIGH AS 150/113), SATS 99% 2 LITERS OXYGEN    DIAGNOSTICS: WBC 6.5, H/H 12.6/39.0, PLT 88,000; UA NEGATIVE; CA 10.8, GLU 133, BUN 32; BNP 754.1; TROP 0.02; PT/INR 26.2/2.6. CXR 1. Stable cardiomegaly. 2. No acute pulmonary  finding.  3. Chronic elevation of the left diaphragm.    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 PATIENT WITH NEED FOR IV DIURETICS > 24 HOURS.

## 2017-04-25 NOTE — Plan of Care (Signed)
Problem: Hemodynamic Status: Cardiac  Goal: Stable vital signs and fluid balance  Outcome: Progressing   04/25/17 1455   Goal/Interventions addressed this shift   Stable vital signs and fluid balance Monitor/assess vital signs and telemetry per unit protocol;Weigh on admission and record weight daily;Assess signs and symptoms associated with cardiac rhythm changes   Pt reports her dyspnea decreased from initial ED presentation

## 2017-04-25 NOTE — Plan of Care (Signed)
Problem: Ineffective Gas Exchange  Goal: Effective breathing pattern  Outcome: Progressing   04/25/17 2213   Goal/Interventions addressed this shift   Effective breathing pattern Maintain O2 saturation level per LIP order;Teach/reinforce use of ordered respiratory interventions (ie. CPAP, BiPAP, Incentive Spirometer, Acapella);Monitor for sleep apnea;Monitor for medication induced respiratory depression

## 2017-04-25 NOTE — ED Notes (Signed)
Registration called with admission information. Full admit to room 201. Med/Surg staff aware.

## 2017-04-25 NOTE — ED Provider Notes (Signed)
Physician/Midlevel provider first contact with patient: 04/25/17 1134         History     Chief Complaint   Patient presents with   . Shortness of Breath     HPI     This patient with increasing dyspnea over the last 10-14 days.  She has a known h/o CHF and she recognizes her symptoms as a typical exacerbation.  She has been living with the worsening dyspnea because she did not want to "ruin everyone's Christmas."  She lives with family, but is normally 100% independent, cooks, cleans, bathes, and drives.  However she can no longer take more than two steps without having to stop and catch her breath.  She states that as long as she is at rest she is symptom free, but she clearly cannot even speak in complete sentences due to her dyspnea.  Family endorses that she has a habit of minimizing her symptoms, not wanting to be a bother to anyone, and this habit is clearly manifesting itself today.  She denies chest pain.  No fever.  She has a pacemaker due to sick sinus syndrome and is on life-long coumadin due to PAF.  She is followed by her cardiologist, Dr Pecola Leisure, in [place] and had a reassuring checkup on 11/20.      Past Medical History:   Diagnosis Date   . Aortic aneurysm    . Atrial fibrillation    . Chronic obstructive pulmonary disease    . Coronary artery disease    . Gastroesophageal reflux disease    . Hypertension    . Malignant neoplasm    . Pacemaker    . SSS (sick sinus syndrome)        Past Surgical History:   Procedure Laterality Date   . CARDIAC PACEMAKER PLACEMENT      2002   . HYSTERECTOMY         Family History   Problem Relation Age of Onset   . Breast cancer Sister        Social  Social History   Substance Use Topics   . Smoking status: Never Smoker   . Smokeless tobacco: Never Used   . Alcohol use No       .     Allergies   Allergen Reactions   . Adhesive [Wound Dressing Adhesive]    . Codeine    . Morphine    . Nitroglycerin    . Pain Patch [Menthol]        Home Medications              albuterol (PROVENTIL) (2.5 MG/3ML) 0.083% nebulizer solution     Take 2.5 mg by nebulization every 6 (six) hours as needed for Wheezing.     atenolol (TENORMIN) 50 MG tablet     Take 50 mg by mouth daily.         benzonatate (TESSALON) 200 MG capsule     Take 200 mg by mouth 3 (three) times daily as needed.     carbidopa-levodopa (SINEMET) 25-100 MG per tablet     Take 1 tablet by mouth 2 (two) times daily.     diltiazem (TIAZAC) 240 MG 24 hr capsule     Take 240 mg by mouth 2 (two) times daily.        estradiol (ESTRACE) 0.1 MG/GM vaginal cream     Place 2 g vaginally daily.     fluorometholone (EFLONE) 0.1 % ophthalmic suspension  1 drop 4 (four) times daily.     furosemide (LASIX) 40 MG tablet     TAKE 1 TABLET BY MOUTH TWO TIMES DAILY ON AN EMPTY STOMACH     HYDROcodone-acetaminophen (NORCO) 5-325 MG per tablet     Take 2 tablets by mouth 2 (two) times daily.        ketoconazole (NIZORAL) 2 % cream     Apply topically daily.     latanoprost (XALATAN) 0.005 % ophthalmic solution     1 drop nightly.     omeprazole (PRILOSEC) 20 MG capsule     Take 20 mg by mouth daily.     rosuvastatin (CRESTOR) 10 MG tablet     Take 10 mg by mouth daily.     spironolactone (ALDACTONE) 50 MG tablet     Take 50 mg by mouth daily.     warfarin (COUMADIN) 3 MG tablet     Take 1 tablet (3 mg total) by mouth daily.     Patient taking differently:  Take 2.5 mg by mouth daily.              Review of Systems   Constitutional: Negative for chills and fever.   Respiratory: Positive for shortness of breath. Negative for wheezing.    Cardiovascular: Negative for chest pain and palpitations.   Gastrointestinal: Negative for constipation, diarrhea, nausea and vomiting.   Neurological: Negative for dizziness and headaches.   All other systems reviewed and are negative.      Physical Exam    BP: 139/64, Heart Rate: 69, Temp: 97.4 F (36.3 C), Resp Rate: 18, SpO2: 99 %, Weight: 77.6 kg    Physical Exam   Constitutional: She is oriented to  person, place, and time. She appears well-developed and well-nourished. No distress.   HENT:   Head: Normocephalic and atraumatic.   Neck: Normal range of motion. Neck supple.   Cardiovascular: Normal rate, regular rhythm and normal heart sounds.    Pulmonary/Chest: Effort normal. She has rales in the right lower field and the left lower field.   Abdominal: Soft. Bowel sounds are normal. She exhibits no distension. There is no tenderness.   Lymphadenopathy:     She has no cervical adenopathy.   Neurological: She is alert and oriented to person, place, and time. She displays normal reflexes. She exhibits normal muscle tone.   Skin: Skin is warm and dry. No rash noted.   Psychiatric: She has a normal mood and affect. Her behavior is normal.   Nursing note and vitals reviewed.    Results     Procedure Component Value Units Date/Time    PT/APTT [[id number]]  (Abnormal) Collected:  04/25/17 1100    Specimen:  Blood Updated:  04/25/17 1225     PT 26.2 (H) sec      PT INR 2.6 (H)     aPTT 39.2 (H) sec     Troponin I [[id number]] Collected:  04/25/17 1100    Specimen:  Plasma Updated:  04/25/17 1222     Troponin I 0.02 ng/mL     B-type Natriuretic Peptide [[id number]]  (Abnormal) Collected:  04/25/17 1100    Specimen:  Blood Updated:  04/25/17 1140     B-Natriuretic Peptide 754.1 (H) pg/mL     Comprehensive metabolic panel [[id number]]  (Abnormal) Collected:  04/25/17 1100    Specimen:  Plasma Updated:  04/25/17 1133     Sodium 139 mMol/L      Potassium 4.4 mMol/L  Chloride 104 mMol/L      CO2 26.0 mMol/L      Calcium 10.8 (H) mg/dL      Glucose 161 (H) mg/dL      Creatinine 0.96 mg/dL      BUN 32 (H) mg/dL      Protein, Total 6.5 gm/dL      Albumin 4.1 gm/dL      Alkaline Phosphatase 115 U/L      ALT 5 U/L      AST (SGOT) 29 U/L      Bilirubin, Total 0.9 mg/dL      Albumin/Globulin Ratio 1.67 (H) Ratio      Anion Gap 13.4 mMol/L      BUN/Creatinine Ratio 27.1 Ratio      EGFR 44 (L) mL/min/1.60m2      Osmolality Calc 286  mOsm/kg      Globulin 2.4 gm/dL     Urinalysis with Microscopic if Indicated [[id number]] Collected:  04/25/17 1118    Specimen:  Urine, Random Updated:  04/25/17 1126     Color, UA Yellow     Clarity, UA Clear     Specific Gravity, UR <=1.005     pH, Urine 5.5 pH      Protein, UR Negative mg/dL      Glucose, UA Negative mg/dL      Ketones UA Negative mg/dL      Bilirubin, UA Negative mg/dL      Blood, UA Negative mg/dL      Nitrite, UA Negative     Urobilinogen, UA 0.2 mg/dL      Leukocyte Esterase, UA Negative Leu/uL     CBC and differential [[id number]]  (Abnormal) Collected:  04/25/17 1100    Specimen:  Blood from Blood Updated:  04/25/17 1120     WBC 6.5 K/cmm      RBC 4.04 M/cmm      Hemoglobin 12.6 gm/dL      Hematocrit 21.3 %      MCV 96 fL      MCH 31 pg      MCHC 32 gm/dL      RDW 08.6 %      PLT CT 88 (L) K/cmm      MPV 6.8 fL      NEUTROPHIL % 74.8 %      Lymphocytes 14.4 (L) %      Monocytes 9.8 %      Eosinophils % 0.2 %      Basophils % 0.8 %      Neutrophils Absolute 4.9 K/cmm      Lymphocytes Absolute 0.9 K/cmm      Monocytes Absolute 0.6 K/cmm      Eosinophils Absolute 0.0 K/cmm      BASO Absolute 0.0 K/cmm         Radiology Results (24 Hour)     Procedure Component Value Units Date/Time    XR Chest AP Portable [[id number]] Collected:  04/25/17 1125    Order Status:  Completed Updated:  04/25/17 1127    Narrative:       INDICATION:  Reason For Exam: SOB  Patient states SOB x 2 weeks, worse this AM.     EXAMINATION: Chest 1 view    COMPARISON: 06/26/2016    TECHNIQUE: Portable AP view.    FINDINGS:    Lines: Left-sided pacemaker with dual leads is unchanged.    Lungs and pleura: Chronic elevation of the left hemidiaphragm is redemonstrated. The visible lung fields are clear.  There is no pleural effusion or pneumothorax.    Cardiomediastinal silhouette: Cardiomegaly is unchanged.    Bones and soft tissues: No acute osseous abnormality.  Soft tissues are unremarkable.      Impression:       IMPRESSION:    1. Stable cardiomegaly.  2. No acute pulmonary finding.  3. Chronic elevation of the left diaphragm.    ReadingStation:[id number]            MDM and ED Course     ED Medication Orders     Start Ordered     Status Ordering Provider    04/25/17 1218 04/25/17 1217  furosemide (LASIX) injection 40 mg  Once in ED     Route: Intravenous  Ordered Dose: 40 mg     Last MAR action:  Given Izeyah Deike DAVID             MDM  Number of Diagnoses or Management Options  Diagnosis management comments: I spoke to Dr Karie Mainland, on call for the patient's cardiologist Dr Pecola Leisure, who endorses her current treatment plan and does not believe that transferring her to a higher level of care is necessary.                   Procedures    Clinical Impression & Disposition     Clinical Impression  Final diagnoses:   Acute on chronic congestive heart failure, unspecified heart failure type        ED Disposition     ED Disposition Condition Date/Time Comment    Admit  Wed Apr 25, 2017  1:01 PM [name] Stretch to be admitted.    Condition at disposition: Serious             New Prescriptions    No medications on file                 Elenor Quinones, MD  04/25/17 1301

## 2017-04-25 NOTE — H&P (Signed)
[facility]  ADMISSION HISTORY AND PHYSICAL EXAM    Date Time: 04/25/17 4:09 PM  Patient Name: [name]  Attending Physician: Graciela Husbands, DO      History of Present Illness:   [name] is a 76 y.o. female who presented to the ED today with difficulty breathing progressing over the past 2 weeks. She reports to have hesitated in coming in to the hospital, trying to wait for the Christmas holiday to conclude.  She has a history of chronic CHF on lasix 40 mg bid and spironolactone 50 mg daily at home, COPD without history of smoking, a baseline non-functioning left diaphragm, and sick sinus syndrome/chronic afib with pacemaker placed in 2002, and changed in 2016. She reports to use oxygen chronically at home, 2 liters.  She reports to have had chest pressure on and off this past year, without specific "pain" but denies any chest pressure or pain today or recently. She reports normal urinary patterns without dysuria, hematuria. Denies cough, sputum production, fever.    Denies nausea, vomiting, abdominal pain, changes in bowel or bladder problems.  Some increase in pedal edema.  Had a small amount of [place] salted ham yesterday for Christmas dinner.  Denied cough, fever, chills.    In the ED she received 40 mg IV lasix with appropriate diureses of 1050 cc urine with an additional 750 cc urine once up to the floor.  Her CBC, CMP, and troponin all returned normal, while her BNP returned at 754.  Her chest xray was overall unremarkable for an acute finding while noting her baseline elevated left diaphragm.       Past Medical History:     Past Medical History:   Diagnosis Date   . Aortic aneurysm    . Atrial fibrillation    . Chronic obstructive pulmonary disease    . Coronary artery disease    . Gastroesophageal reflux disease    . Hypertension    . Malignant neoplasm    . Pacemaker    . SSS (sick sinus syndrome)      Detailed History:    1.  Nonobstructive  coronary artery disease by catheterization; 2008 Dr. Duffy Rhody  2.  Acute on chronic congestive heart failure, unspecified congestive heart failure type     a)  Echo 05/04/06- Mild AR. Mild TR with estimated RVSP    b)  Echo 03/23/11- Mild MR and AR. Moderate TR. Normal LV systolic function. LV concentric hypertrophy. Moderate elevation of RVSP  3.  Paroxysmal atrial fibrillation    a)  Echo 12/28/10- LV normal size and systolic function. mild concentric hypertrophy. Mild LAE. RV size normal with normal systolic function. RA normal size. Aortic root mildly enlarged.     b) Echo 11/06/13- LV concentric hypertrophy. Normal LV systolic function. Mild-moderate AR. Severe TR. Slightly elevated estimated RVSP  4.  Hyperkalemia   5.  Glaucoma, right eye  6.  On home oxygen therapy   7. Aortic regurgitation   8.  SSS, AFib w RVR, symptomatic  bradycardia    a)  S/p MDT dual chamber pacer placed by Dr. Pecola Leisure 04/03/2001, left pect.    b)  S/p gen change 03/22/15  9.  Acquired kyphoscoliosis  10. Degenerative disc disease; deformity of thoracic vertebra  11.  Urge incontinence of urine  12.  Osteoporosis  13.  History of preserved left ventricular function    a)  Echo 11/06/13- Concentric LVH. Normal LV  systolic function. Mild-moderate AR. Severe TR. Slightly elevated estimated RVSP.  14.  Dizziness    a)  Tilt table 05/03/06- Negative study for neurocardiogenic syncope      Past Surgical History:     Past Surgical History:   Procedure Laterality Date   . CARDIAC PACEMAKER PLACEMENT      2002   . HYSTERECTOMY         Family History:     Family History   Problem Relation Age of Onset   . Breast cancer Sister        Social History:     Social History     Social History   . Marital status: Widowed     Spouse name: N/A   . Number of children: N/A   . Years of education: N/A     Social History Main Topics   . Smoking status: Never Smoker   . Smokeless tobacco: Never Used   . Alcohol use No   . Drug use: No   . Sexual activity: Not on  file     Other Topics Concern   . Not on file     Social History Narrative   . No narrative on file       Allergies:     Allergies   Allergen Reactions   . Adhesive [Wound Dressing Adhesive]    . Codeine    . Morphine    . Nitroglycerin    . Pain Patch [Menthol]        Medications:     Prescriptions Prior to Admission   Medication Sig   . albuterol (PROVENTIL) (2.5 MG/3ML) 0.083% nebulizer solution Take 2.5 mg by nebulization every 6 (six) hours as needed for Wheezing.   Marland Kitchen atenolol (TENORMIN) 50 MG tablet Take 50 mg by mouth daily.       . benzonatate (TESSALON) 200 MG capsule Take 200 mg by mouth 3 (three) times daily as needed.   . carbidopa-levodopa (SINEMET) 25-100 MG per tablet Take 1 tablet by mouth 2 (two) times daily.   Marland Kitchen diltiazem (TIAZAC) 240 MG 24 hr capsule Take 240 mg by mouth 2 (two) times daily.      Marland Kitchen estradiol (ESTRACE) 0.1 MG/GM vaginal cream Place 2 g vaginally daily.   . fluorometholone (EFLONE) 0.1 % ophthalmic suspension 1 drop 4 (four) times daily.   . furosemide (LASIX) 40 MG tablet TAKE 1 TABLET BY MOUTH TWO TIMES DAILY ON AN EMPTY STOMACH   . HYDROcodone-acetaminophen (NORCO) 5-325 MG per tablet Take 2 tablets by mouth 2 (two) times daily.      Marland Kitchen ketoconazole (NIZORAL) 2 % cream Apply topically daily.   Marland Kitchen latanoprost (XALATAN) 0.005 % ophthalmic solution 1 drop nightly.   Marland Kitchen omeprazole (PRILOSEC) 20 MG capsule Take 20 mg by mouth daily.   . rosuvastatin (CRESTOR) 10 MG tablet Take 10 mg by mouth daily.   Marland Kitchen spironolactone (ALDACTONE) 50 MG tablet Take 50 mg by mouth daily.   Marland Kitchen warfarin (COUMADIN) 3 MG tablet Take 1 tablet (3 mg total) by mouth daily. (Patient taking differently: Take 2.5 mg by mouth daily.   )       Review of Systems:     A review of systems was overall negative except for items listed as above.    Physical Exam:     Vitals:    04/25/17 1439   BP: 137/64   Pulse: 65   Resp: 20   Temp: 98.2 F (36.8 C)  SpO2: 94%       Intake and Output Summary (Last 24 hours) at Date  Time    Intake/Output Summary (Last 24 hours) at 04/25/17 1609  Last data filed at 04/25/17 1553   Gross per 24 hour   Intake                0 ml   Output             1800 ml   Net            -1800 ml       General: NAD, alert and oriented to person, place and time, pleasant, denies any pain  Eyes:  PERRLA and EOM intact  ENT: negative JVD, trachea midline, no lymphadenopathy   Neck: No cervical adenopathy, no carotid bruit, no JVD.  Heart: rate irregular, baseline atrial fibrillation with occasional paced beats, AICD present left chest wall,   Lungs: good respiratory effort, scattered expiratory wheezes, no rhales, or rhonchi.   Abdomen: Normal bowel sounds, abdomen soft, non-distended, non-tender.  Extremities: +1 bilateral pedal edema, no cyanosis or clubbing  Musculoskeletal: Strength 5/5, full AROM, uses walker  Neurological:  CN II-XII grossly intact, sensation intact.  Skin: warm and dry, no rashes or erythema. Chronic bilateral lower leg darkened pigmentation, patient reports a side effect of an unknown medication.         Labs:     Results     Procedure Component Value Units Date/Time    PT/APTT [[id number]]  (Abnormal) Collected:  04/25/17 1100    Specimen:  Blood Updated:  04/25/17 1225     PT 26.2 (H) sec      PT INR 2.6 (H)     aPTT 39.2 (H) sec     Troponin I [[id number]] Collected:  04/25/17 1100    Specimen:  Plasma Updated:  04/25/17 1222     Troponin I 0.02 ng/mL     B-type Natriuretic Peptide [[id number]]  (Abnormal) Collected:  04/25/17 1100    Specimen:  Blood Updated:  04/25/17 1140     B-Natriuretic Peptide 754.1 (H) pg/mL     Comprehensive metabolic panel [[id number]]  (Abnormal) Collected:  04/25/17 1100    Specimen:  Plasma Updated:  04/25/17 1133     Sodium 139 mMol/L      Potassium 4.4 mMol/L      Chloride 104 mMol/L      CO2 26.0 mMol/L      Calcium 10.8 (H) mg/dL      Glucose 629 (H) mg/dL      Creatinine 5.28 mg/dL      BUN 32 (H) mg/dL      Protein, Total 6.5 gm/dL      Albumin 4.1 gm/dL       Alkaline Phosphatase 115 U/L      ALT 5 U/L      AST (SGOT) 29 U/L      Bilirubin, Total 0.9 mg/dL      Albumin/Globulin Ratio 1.67 (H) Ratio      Anion Gap 13.4 mMol/L      BUN/Creatinine Ratio 27.1 Ratio      EGFR 44 (L) mL/min/1.39m2      Osmolality Calc 286 mOsm/kg      Globulin 2.4 gm/dL     Urinalysis with Microscopic if Indicated [[id number]] Collected:  04/25/17 1118    Specimen:  Urine, Random Updated:  04/25/17 1126     Color, UA Yellow     Clarity, UA Clear  Specific Gravity, UR <=1.005     pH, Urine 5.5 pH      Protein, UR Negative mg/dL      Glucose, UA Negative mg/dL      Ketones UA Negative mg/dL      Bilirubin, UA Negative mg/dL      Blood, UA Negative mg/dL      Nitrite, UA Negative     Urobilinogen, UA 0.2 mg/dL      Leukocyte Esterase, UA Negative Leu/uL     CBC and differential [[id number]]  (Abnormal) Collected:  04/25/17 1100    Specimen:  Blood from Blood Updated:  04/25/17 1120     WBC 6.5 K/cmm      RBC 4.04 M/cmm      Hemoglobin 12.6 gm/dL      Hematocrit 40.9 %      MCV 96 fL      MCH 31 pg      MCHC 32 gm/dL      RDW 81.1 %      PLT CT 88 (L) K/cmm      MPV 6.8 fL      NEUTROPHIL % 74.8 %      Lymphocytes 14.4 (L) %      Monocytes 9.8 %      Eosinophils % 0.2 %      Basophils % 0.8 %      Neutrophils Absolute 4.9 K/cmm      Lymphocytes Absolute 0.9 K/cmm      Monocytes Absolute 0.6 K/cmm      Eosinophils Absolute 0.0 K/cmm      BASO Absolute 0.0 K/cmm             Rads:   Radiological Procedure reviewed.    Radiology Results (24 Hour)     Procedure Component Value Units Date/Time    XR Chest AP Portable [[id number]] Collected:  04/25/17 1125    Order Status:  Completed Updated:  04/25/17 1127    Narrative:       INDICATION:  Reason For Exam: SOB  Patient states SOB x 2 weeks, worse this AM.     EXAMINATION: Chest 1 view    COMPARISON: 06/26/2016    TECHNIQUE: Portable AP view.    FINDINGS:    Lines: Left-sided pacemaker with dual leads is unchanged.    Lungs and pleura: Chronic  elevation of the left hemidiaphragm is redemonstrated. The visible lung fields are clear. There is no pleural effusion or pneumothorax.    Cardiomediastinal silhouette: Cardiomegaly is unchanged.    Bones and soft tissues: No acute osseous abnormality.  Soft tissues are unremarkable.      Impression:       IMPRESSION:   1. Stable cardiomegaly.  2. No acute pulmonary finding.  3. Chronic elevation of the left diaphragm.    ReadingStation:[id number]          Assessment and Plan:     Active Hospital Problems    Diagnosis   . Difficulty breathing       1.  Code Status:  Full Code.  Patient states that if there is something we can "fix", to go ahead and do CPR and everything necessary to help fix her.  However, it is something that seems to be terminal in nature, do not do CPR.  ED troponin negative, will continue to trend.     2.  Acute on Chronic Heart Failure:  Continue baseline Lasix 40 mg BID after one time additional IV lasix in the  ED.  Continue home aldactone dose of 50 mg daily.  Lung sounds once on the floor much improved.  ED BNP 754.  Will reevaluate BNP in the a.m.   Urine output 1800 cc total with initial ED output and first output once arrived to the floor. Renal function stable.    3.  Paroxysmal Atrial Fibrillation:  Continue daily diltiazem.  Rate controlled currently, AICD in place firing appropriately.  On coumadin 2.5 mg daily, therapeutic INR at 2.6 today.  Placed on protocol with daily INR checks and pharmacy to aid in dosing.    4.  COPD:  Had some slight expiratory wheezing in the ED, improved upon arrival to the floor.  PRN albuterol available.     5.  AM labs:  CBC, CMP, BNP, Troponin    I have discussed this case with Dr. Amada Jupiter who will be following this patient during admission.    Signed by: Delton See, FNP-C        I hereby certify this patient for hospitalization based upon medical necessity as noted above.

## 2017-04-25 NOTE — ED Notes (Signed)
Delton See, Pa-C at bedside for admission.

## 2017-04-25 NOTE — ED Triage Notes (Signed)
Patient c/o SOB x 2 weeks - worse this am

## 2017-04-26 DIAGNOSIS — Z950 Presence of cardiac pacemaker: Secondary | ICD-10-CM

## 2017-04-26 DIAGNOSIS — J9601 Acute respiratory failure with hypoxia: Secondary | ICD-10-CM

## 2017-04-26 DIAGNOSIS — Z9981 Dependence on supplemental oxygen: Secondary | ICD-10-CM

## 2017-04-26 DIAGNOSIS — R0789 Other chest pain: Secondary | ICD-10-CM

## 2017-04-26 DIAGNOSIS — I480 Paroxysmal atrial fibrillation: Secondary | ICD-10-CM

## 2017-04-26 LAB — ECG 12-LEAD
Interpretation Text: NORMAL
Interpretation Text: NORMAL
P-R Interval: 0 ms
P-R Interval: 0 ms
P-R Interval: 0 ms
Patient Age: 76 years
Patient Age: 76 years
Patient Age: 76 years
Q-T Interval(Corrected): 420 ms
Q-T Interval(Corrected): 458 ms
Q-T Interval(Corrected): 430 ms
Q-T Interval: 411 ms
Q-T Interval: 416 ms
Q-T Interval: 432 ms
QRS Axis: 2 deg
QRS Axis: 5 deg
QRS Axis: -20 deg
QRS Duration: 110 ms
QRS Duration: 114 ms
QRS Duration: 112 ms
T Axis: -59 years
T Axis: -85 years
T Axis: -67 years
Ventricular Rate: 62 //min
Ventricular Rate: 72 //min
Ventricular Rate: 59 //min

## 2017-04-26 LAB — CBC AND DIFFERENTIAL
Basophils %: 2.3 % (ref 0.0–3.0)
Basophils Absolute: 0.1 10*3/uL (ref 0.0–0.3)
Eosinophils %: 0.3 % (ref 0.0–7.0)
Eosinophils Absolute: 0 10*3/uL (ref 0.0–0.8)
Hematocrit: 40.3 % (ref 36.0–48.0)
Hemoglobin: 13.1 gm/dL (ref 12.0–16.0)
Lymphocytes Absolute: 1 10*3/uL (ref 0.6–5.1)
Lymphocytes: 18.6 % (ref 15.0–46.0)
MCH: 31 pg (ref 28–35)
MCHC: 32 gm/dL (ref 31–36)
MCV: 96 fL (ref 80–100)
MPV: 8.4 fL (ref 6.0–10.0)
Monocytes Absolute: 0.7 10*3/uL (ref 0.1–1.7)
Monocytes: 13.5 % (ref 3.0–15.0)
Neutrophils %: 65.3 % (ref 42.0–78.0)
Neutrophils Absolute: 3.4 10*3/uL (ref 1.7–8.6)
PLT CT: 91 10*3/uL — ABNORMAL LOW (ref 130–440)
RBC: 4.2 10*6/uL (ref 3.80–5.00)
RDW: 12 % (ref 10.5–14.5)
WBC: 5.1 10*3/uL (ref 4.0–11.0)

## 2017-04-26 LAB — COMPREHENSIVE METABOLIC PANEL
ALT: 13 U/L (ref 0–55)
AST (SGOT): 25 U/L (ref 10–42)
Albumin/Globulin Ratio: 1.54 Ratio — ABNORMAL HIGH (ref 0.70–1.50)
Albumin: 3.8 gm/dL (ref 3.5–5.0)
Alkaline Phosphatase: 109 U/L (ref 40–145)
Anion Gap: 11.9 mMol/L (ref 7.0–18.0)
BUN / Creatinine Ratio: 30.4 Ratio — ABNORMAL HIGH (ref 10.0–30.0)
BUN: 28 mg/dL — ABNORMAL HIGH (ref 7–22)
Bilirubin, Total: 1.2 mg/dL (ref 0.1–1.2)
CO2: 33 mMol/L — ABNORMAL HIGH (ref 20.0–30.0)
Calcium: 10.4 mg/dL (ref 8.5–10.5)
Chloride: 96 mMol/L — ABNORMAL LOW (ref 98–110)
Creatinine: 0.92 mg/dL (ref 0.60–1.20)
EGFR: 60 mL/min/{1.73_m2} (ref 60–150)
Globulin: 2.4 gm/dL (ref 2.0–4.0)
Glucose: 90 mg/dL (ref 71–99)
Osmolality Calc: 280 mOsm/kg (ref 275–300)
Potassium: 3.8 mMol/L (ref 3.5–5.3)
Protein, Total: 6.2 gm/dL (ref 6.0–8.3)
Sodium: 138 mMol/L (ref 136–147)

## 2017-04-26 LAB — MAGNESIUM: Magnesium: 1.8 mg/dL (ref 1.6–2.6)

## 2017-04-26 LAB — PT/INR
PT INR: 2.6 — ABNORMAL HIGH (ref 0.8–1.2)
PT: 26.4 s — ABNORMAL HIGH (ref 9.4–12.5)

## 2017-04-26 LAB — B-TYPE NATRIURETIC PEPTIDE: B-Natriuretic Peptide: 600.2 pg/mL — ABNORMAL HIGH (ref 0.0–100.0)

## 2017-04-26 MED ORDER — ALUM & MAG HYDROXIDE-SIMETH 200-200-20 MG/5ML PO SUSP
30.00 mL | Freq: Three times a day (TID) | ORAL | Status: DC | PRN
Start: 2017-04-26 — End: 2017-04-27
  Administered 2017-04-26: 14:00:00 30 mL via ORAL
  Filled 2017-04-26: qty 30

## 2017-04-26 MED ORDER — VH POTASSIUM CHLORIDE CRYS ER 10 MEQ PO TBCR (WRAP)
10.00 meq | EXTENDED_RELEASE_TABLET | Freq: Four times a day (QID) | ORAL | Status: DC
Start: 2017-04-26 — End: 2017-04-27
  Administered 2017-04-26 – 2017-04-27 (×4): 10 meq via ORAL
  Filled 2017-04-26 (×4): qty 1

## 2017-04-26 MED ORDER — POLYETHYLENE GLYCOL 3350 17 G PO PACK
17.00 g | PACK | Freq: Every day | ORAL | Status: DC
Start: 2017-04-26 — End: 2017-04-27
  Administered 2017-04-26: 14:00:00 17 g via ORAL
  Filled 2017-04-26 (×2): qty 1

## 2017-04-26 NOTE — Progress Notes (Signed)
[facility]  [address]  [place], [place] [address]    Patient identified as a Moderate Risk for readmission based on the current risk score  Estimated D/C Date: TBD potentially in 2-3 days   Risk Score: 14%   Contact with whom discharge plan was discussed:     Patient, [name]   Facility List and Fact Sheet Provided: Y/N    NA - patient is declining home health services with multiple attempts   Facility Preferences and Order of Preference     NA   Financial status              UAI/PAS Requested NA           Insurance coverage    Medicare and Artist   Med Assist Referral/FIS    NA   RX Coverage:   Yes - uses [facility] in [place]   Confirmed PCP with patient: name/last visit   Yes   Anticipated DME needed for D/C   None at this time   Inpatient Plan of Care:      Continue telemetry, strict intake/ output, continuous pulse oximetry, incentive spirometry, and manage oxygen needs     DCP Interventions/Comments:      Electrical record reviewed  Initial discharge planning assessment complete  Listening and emotional support provided  Monitor for interventions that may arise until discharge time   Barriers:     Medical stability   Transportation Needed:     No - family to provide transportation home via private vehicle

## 2017-04-26 NOTE — Progress Note - Problem Oriented Charting Notewrit (Signed)
[name] is a lovely 76 year old woman who came to the ED yesterday complaining of difficulty breathing. It has been an ongoing problem for a couple of weeks but she was trying to make it to the Christmas holiday before she came in for attention. She was having orthopnea and worsening dyspnea with exertion. On arrival her BNP was elevated and her chest x-ray showed enhanced interstitial markings and chronic cardiac enlargement. A cardiac echo done a couple of years ago showed evidence of diastolic dysfunction and left ventricular hypertrophy. She had mild aortic valve regurgitation and severe tricuspid valve regurgitation. She tells me she was hospitalized last year and [place] but I do not see an echo in their records. Her PCP is Dr. Elmer Ramp.   On admission she was seen by NP Delton See, whose H/P beautifully summarizes the clinical situation. The patient was given intravenous Lasix in the ED after a correct diagnosis of congestive heart failure was made and she was already diuresis and when seen by [facility]. She is currently getting Lasix 40 mg by mouth twice per day which is her home dose. She has a history of atrial fibrillation and sinus node dysfunction. She is anticoagulated with warfarin. She has a pacemaker set at a rate of 60. ECG on admission revealed normal sensing and capture mechanism with a pacemaker. Her rhythm underlying all was atrial fibrillation.  Chest x-ray shows chronic elevation of the left hemidiaphragm and marked cardiac enlargement.  After diuresing about 4 L of urine and she feels symptomatically improved. She is on supplemental oxygen on chronic basis. She carries with her history of COPD which is probably asthma.    Vitals:    04/26/17 1015   BP: 117/64   Pulse: 72   Resp: 17   Temp: 98 F (36.7 C)   SpO2: 98%     Lungs with a few scattered wheezes bilaterally. Crackles are noted in the bases.  There is no JVD present  Cardiac rhythm is irregular S1 and S2 are heard, the rate is  controlled  There is no lower extremity edema.  Abdomen is soft and nontender    Results     Procedure Component Value Units Date/Time    Comprehensive metabolic panel [[id number]]  (Abnormal) Collected:  04/26/17 0655    Specimen:  Plasma Updated:  04/26/17 0729     Sodium 138 mMol/L      Potassium 3.8 mMol/L      Chloride 96 (L) mMol/L      CO2 33.0 (H) mMol/L      Calcium 10.4 mg/dL      Glucose 90 mg/dL      Creatinine 5.63 mg/dL      BUN 28 (H) mg/dL      Protein, Total 6.2 gm/dL      Albumin 3.8 gm/dL      Alkaline Phosphatase 109 U/L      ALT 13 U/L      AST (SGOT) 25 U/L      Bilirubin, Total 1.2 mg/dL      Albumin/Globulin Ratio 1.54 (H) Ratio      Anion Gap 11.9 mMol/L      BUN/Creatinine Ratio 30.4 (H) Ratio      EGFR 60 mL/min/1.64m2      Osmolality Calc 280 mOsm/kg      Globulin 2.4 gm/dL     Magnesium [[id number]] Collected:  04/26/17 0655    Specimen:  Plasma Updated:  04/26/17 0729     Magnesium 1.8 mg/dL  B-type Natriuretic Peptide (in am) [[id number]]  (Abnormal) Collected:  04/26/17 0655    Specimen:  Blood Updated:  04/26/17 0728     B-Natriuretic Peptide 600.2 (H) pg/mL     CBC and differential (in am) [[id number]]  (Abnormal) Collected:  04/26/17 0655    Specimen:  Blood from Blood Updated:  04/26/17 0725     WBC 5.1 K/cmm      RBC 4.20 M/cmm      Hemoglobin 13.1 gm/dL      Hematocrit 78.2 %      MCV 96 fL      MCH 31 pg      MCHC 32 gm/dL      RDW 95.6 %      PLT CT 91 (L) K/cmm      MPV 8.4 fL      NEUTROPHIL % 65.3 %      Lymphocytes 18.6 %      Monocytes 13.5 %      Eosinophils % 0.3 %      Basophils % 2.3 %      Neutrophils Absolute 3.4 K/cmm      Lymphocytes Absolute 1.0 K/cmm      Monocytes Absolute 0.7 K/cmm      Eosinophils Absolute 0.0 K/cmm      BASO Absolute 0.1 K/cmm      RBC Morphology SLIDE NOT REQUIRED    Narrative:       Manual differential performed    Daily PT/INR [[id number]]  (Abnormal) Collected:  04/26/17 0655    Specimen:  Blood Updated:  04/26/17 0715     PT 26.4 (H) sec       PT INR 2.6 (H)    Troponin I [[id number]]  (Abnormal) Collected:  04/25/17 2030    Specimen:  Plasma Updated:  04/25/17 2100     Troponin I 0.03 (H) ng/mL     Troponin I [[id number]]  (Abnormal) Collected:  04/25/17 1635    Specimen:  Plasma Updated:  04/25/17 1719     Troponin I 0.03 (H) ng/mL     PT/APTT [[id number]]  (Abnormal) Collected:  04/25/17 1100    Specimen:  Blood Updated:  04/25/17 1225     PT 26.2 (H) sec      PT INR 2.6 (H)     aPTT 39.2 (H) sec     Troponin I [[id number]] Collected:  04/25/17 1100    Specimen:  Plasma Updated:  04/25/17 1222     Troponin I 0.02 ng/mL         The troponin rise does not bother me. It is part of the heart failure. The patient is currently stable. I will have physical therapy evaluate her and take her for a walk.    Active Hospital Problems    Diagnosis   . Chronic respiratory failure with hypoxia, on home O2 therapy   . Difficulty breathing   . Acute on chronic diastolic CHF (congestive heart failure), NYHA class 3   . Aortic regurgitation   . Paroxysmal atrial fibrillation

## 2017-04-26 NOTE — PT Eval Note (Signed)
[facility]:  [facility]  Department of Rehabilitation Services  [phone]  [name] [facility] ([place])    CSN: [id number]    MEDICAL/SURGICAL   201/201-A    Physical Therapy Evaluation    Time of treatment:  Time Calculation  PT Received On: 04/26/17  Start Time: 1445  Stop Time: 1501  Time Calculation (min): 16 min    Visit#:   1                                                                                Precautions and Contraindications:   O2 dependent     Clinical Presentation and Decision Making     PT Assessment:  [name] was admitted 04/25/2017 with difficulty breathing and was diagnosed with CHF. This afternoon pt reports feeling "much better". She was able to rise from bed and ambulate 200' with a steady and continuous gait pattern in the hallway independently. Vitals were stable with SpO2 of 98% during ambulation and heart rate of 71bpm. Pt appears at her baseline for mobility. No further skilled PT needs. Recommend pt ambulate with nursing staff 2-3x/day in hallways to maintain current strength and mobility level.     Patient presenting with the following PT Impairments:Appears to be at baseline for mobility    Due to the presence of no treatment options and 1-2 comorbidities or personal factors that affect performance, as well as patient's stable and/or uncomplicated characteristics, modifications were NOT necessary to complete evaluation when examining total of 3 elements (includes body structures and functions, activity limitations and/or participation restriction) determines the degree of complexity for this patient is LOW    Rehabilitation Potential:Not a PT candidate    Discussed risk, benefits and Plan of Care with: Patient    DISCHARGE RECOMMENDATIONS   DME recommended for Discharge:   None    Discharge Recommendations:       No PT needs     Development of Plan of Care:     Goals:    N/A  Treatment/interventions: No skilled PT interventions needed at this  time.    Treatment Frequency:  N/A    History:   History of Present Illness:    Medical Diagnosis: Acute on chronic congestive heart failure, unspecified heart failure type [I50.9]    [name] is a 76 y.o. female admitted on 04/25/2017 with acute on chronic CHF.     Patient Active Problem List   Diagnosis   . CHF (congestive heart failure)   . Acute on chronic congestive heart failure, unspecified congestive heart failure type   . Paroxysmal atrial fibrillation   . Hyperkalemia   . Acute on chronic diastolic CHF (congestive heart failure), NYHA class 3   . On home oxygen therapy   . Aortic regurgitation   . Pacemaker   . Pacemaker at end of battery life   . Acute exacerbation of CHF (congestive heart failure)   . Diaphragmatic eventration   . Chronic interstitial lung disease   . Thoracic aortic aneurysm without rupture   . Difficulty breathing   . Chronic respiratory failure with hypoxia, on home O2 therapy        X-Rays/Tests/Labs:  Xr  Chest Ap Portable    Result Date: 04/25/2017  IMPRESSION: 1. Stable cardiomegaly. 2. No acute pulmonary finding. 3. Chronic elevation of the left diaphragm. ReadingStation:[id number]        Past Medical/Surgical History:  Past Medical History:   Diagnosis Date   . Aortic aneurysm    . Atrial fibrillation    . Chronic obstructive pulmonary disease    . Coronary artery disease    . Gastroesophageal reflux disease    . Hypertension    . Malignant neoplasm    . Pacemaker    . SSS (sick sinus syndrome)       Past Surgical History:   Procedure Laterality Date   . CARDIAC PACEMAKER PLACEMENT      2002   . HYSTERECTOMY           Social History:    Home Living Arrangements:  Living Arrangements: Family members  Assistance Available: Part time  Type of Home: House  Home Layout: Able to live on main level with bedroom and bathroom    Prior Level of Function:  Community ambulation, Household ambulation with no device  Fall history: Pt states she had a fall back in October from  tripping over something      Subjective   "I feel much better. I'd like to be up walking more."    Patient is agreeable to participation in the therapy session.     Pain:  At Rest: 0/10  With Activity: 0/10  Location: N/A  Interventions: None required    Examination of Body Systems (Structures, Function, Activity and Participation)   Patient's medical condition is appropriate for Physical therapy intervention at this time    Observation of patient:  Patient is in bed with peripheral IV, O2 at 1.5 liters/minute via nasal cannula in place.    Cognition:  Command following: Follows ALL commands and directions without difficulty    Vital Signs (Cardiovascular):  BP Sitting: 121/74 mmHg  HR Sitting:  62 bpm  HR after activity: 71 bpm  SpO2 at rest: 96%  SpO2 with activity: 98%  on supplemental O2            Balance:  Static Sitting:  Good  Dynamic Sitting:  Good  Static Standing:  Good  Dynamic Standing:  Good            Musculoskeletal Examination:            Range of motion:  BLEs grossly WFL       Strength:  BLEs grossly WFL         Functional Mobility:    Bed Mobility:  Supine to Sit:   Independent.        Sit to Supine:   Independent.          Transfers:  Sit to Stand:  Independent with No assistive device.         Stand to Sit:  Independent.           Locomotion:  LEVEL AMBULATION:  Distance: 200'   Assistance level:  Independent  Device:  No assistive device  Pattern:  Reciprocal, Step through                      Participation and Activity Tolerance:  Participation effort: Good  Activity Tolerance: Tolerates 10-20 minutes of activity with occasional rests    Treatment Interventions this session:   Evaluation    Education Provided:   TOPICS: role of physical therapy,  plan of care.     Learner educated: Patient  Method: Explanation  Response to education: Verbalized understanding    Patient Position at End of Treatment:   Supine, in bed, Needs in [name], Bed/chair alarm set and No distress    Team Communication:      Whiteboard updated: Yes      Recommend patient ambulate with nursing staff 2-3x/day during hospitalization to maintain strength/mobility.     Alvino Chapel, DPT

## 2017-04-26 NOTE — Progress Notes (Signed)
Report received, assumed care.  

## 2017-04-26 NOTE — UM Notes (Signed)
UR Continued Stay Review Note for 04/26/17:    According to Dr. Alene Mires rounding note dated 04/26/17 "[name] is a lovely 76 year old woman who came to the ED yesterday complaining of difficulty breathing. It has been an ongoing problem for a couple of weeks but she was trying to make it to the Christmas holiday before she came in for attention. She was having orthopnea and worsening dyspnea with exertion. On arrival her BNP was elevated and her chest x-ray showed enhanced interstitial markings and chronic cardiac enlargement. A cardiac echo done a couple of years ago showed evidence of diastolic dysfunction and left ventricular hypertrophy. She had mild aortic valve regurgitation and severe tricuspid valve regurgitation. She tells me she was hospitalized last year and [place] but I do not see an echo in their records. Her PCP is Dr. Elmer Ramp.   On admission she was seen by NP Delton See, whose H/P beautifully summarizes the clinical situation. The patient was given intravenous Lasix in the ED after a correct diagnosis of congestive heart failure was made and she was already diuresis and when seen by [facility]. She is currently getting Lasix 40 mg by mouth twice per day which is her home dose. She has a history of atrial fibrillation and sinus node dysfunction. She is anticoagulated with warfarin. She has a pacemaker set at a rate of 60. ECG on admission revealed normal sensing and capture mechanism with a pacemaker. Her rhythm underlying all was atrial fibrillation.  Chest x-ray shows chronic elevation of the left hemidiaphragm and marked cardiac enlargement.  After diuresing about 4 L of urine and she feels symptomatically improved. She is on supplemental oxygen on chronic basis. She carries with her history of COPD which is probably asthma".  Lungs noted with few scattered wheezes bilaterally and crackles noted to bases    Vital Signs:  Blood pressure 117/64, pulse 72, respirations 17, temperature 98, SPO2  98%    Diagnostics:  NA 138, K+ 3.8, Bun 28, Cr 0.92, BNP 600, WBC 5.1, H/H13.1/40.3    Additional Orders:  Check BMP in am, INR daily, PT to evaluate and treat    MC[facility] 22ND EDITION MG-PUL PULMONARY DISEASE/M-190 HEART FAILURE WITH CHRONIC INTERSTITIAL LUNG DISEASE, WORSENED TO THE POINT OF BEING UNABLE TO WALK IN BETWEEN ROOMS(ONLY ABLE TO TAKE COUPLE OF STEPS BEFORE DYSPNEA, UNABLE TO SPEAK IN COMPLETE SENTENCES). PATIENT WITH NEED FOR IV DIURETICS > 24 HOURS.

## 2017-04-26 NOTE — Progress Notes (Signed)
Patient was complaining of shortness of breath when this nurse entered room. EKG performed, SATS 98%. Dr. Radene Gunning consulted by nursing supervisor. No changes noted, patient states that she is feeling better at this time. Call bell within reach, will continue to monitor.

## 2017-04-26 NOTE — Plan of Care (Signed)
Problem: Moderate/High Fall Risk Score >5  Goal: Patient will remain free of falls  Outcome: Progressing   04/26/17 2311   OTHER   High (Greater than 13) HIGH-(VH Only) Yellow slippers;HIGH-([facility] Only) Keep door open for better visability;HIGH-([facility] Only) Plan with CNA for priority hourly rounding

## 2017-04-26 NOTE — Progress Notes (Signed)
76 year old female admitted on 04/25/17 with a diagnosis of Difficulty Breathing.  Case management met with patient this morning to discuss discharge planning.  Patient is noted to be alert and oriented x 4 during our conversation.  Patient states that prior to this hospitalization she lived at home and her daughter and son in law lives with her.  Patient states that prior to this illness she remained fairly independent.  Patient states that she is able to perform her own personal care and occasionally cooks simple meals.  Patient states that her daughter does the majority of the cooking, household chores, and grocery shopping.  Patient states that when ambulating inside the home she does not use any type of adaptive equipment to assist with her ambulation needs.  Patient states that when going outside of her home she uses a cane.  Patient also mentions that she has a shower chair, grab bars in the bathroom, a walker, and a Rollator at home that can be used if needed. Patient also mentions that she uses home oxygen at 2 liters, has a home nebulizer, and receives her oxygen needs from [facility].  Patient denies any concerns with their services.  We have discussed patients prescription coverage and patient denies any financial concerns with the cost of her medications.  Patient is noted to have Medicare and [employer].  Patient states that she obtains her mediations from [facility].  We have discussed the potential need for home health services and with several attempts patient denies wanting or needing home health services at time of discharge.  Patient states that her daughter or son in law will provide transportation home via private vehicle at time of discharge.  At present, patient denies any  Concerns or any discharge needs.  However, case management will continue to monitor for interventions that may arise until discharge time.         04/26/17 1012   Patient Type   Within 30 Days of Previous Admission? No    Healthcare Decisions   Interviewed: Patient   Orientation/Decision Making Abilities of Patient Alert and Oriented x3, able to make decisions   Prior to admission   Prior level of function Independent with ADLs;Ambulates with assistive device   Type of Residence Private residence   Have running water, electricity, heat, etc? Yes   Living Arrangements Children  (daughter and son-in-law)   Dressing Independent   Grooming Independent   Feeding Independent   Bathing Independent   Toileting Independent   DME Currently at Home Single point cane;Front wheel walker;Four wheel walker;Grab bars;Other (Comment);Home O2  (shower chair)   Home Care/Community Services None   Adult Protective Services (APS) involved? No   Discharge Planning   Support Systems Children;Family members;Friends/neighbors   Anticipated [place] plan discussed with: Patient   Mode of transportation: Private car (family member)   Consults/Providers   PT Evaluation Needed 2   OT Evalulation Needed 2   SLP Evaluation Needed 2   Correct PCP listed in Epic? Yes   Important Message from [facility] Notice   Patient received 1st IMM Letter? Yes   Date of most recent IMM given: 04/25/17

## 2017-04-27 DIAGNOSIS — I5033 Acute on chronic diastolic (congestive) heart failure: Secondary | ICD-10-CM

## 2017-04-27 LAB — ECG 12-LEAD
P-R Interval: 0 ms
P-R Interval: 0 ms
Patient Age: 76 years
Patient Age: 76 years
Q-T Interval(Corrected): 448 ms
Q-T Interval(Corrected): 431 ms
Q-T Interval: 433 ms
Q-T Interval: 429 ms
QRS Axis: 2 deg
QRS Axis: -7 deg
QRS Duration: 117 ms
QRS Duration: 110 ms
T Axis: -75 years
T Axis: -64 years
Ventricular Rate: 64 //min
Ventricular Rate: 60 //min

## 2017-04-27 LAB — BASIC METABOLIC PANEL
Anion Gap: 12.3 mMol/L (ref 7.0–18.0)
BUN / Creatinine Ratio: 32 Ratio — ABNORMAL HIGH (ref 10.0–30.0)
BUN: 31 mg/dL — ABNORMAL HIGH (ref 7–22)
CO2: 32 mMol/L — ABNORMAL HIGH (ref 20.0–30.0)
Calcium: 10.4 mg/dL (ref 8.5–10.5)
Chloride: 98 mMol/L (ref 98–110)
Creatinine: 0.97 mg/dL (ref 0.60–1.20)
EGFR: 56 mL/min/{1.73_m2} — ABNORMAL LOW (ref 60–150)
Glucose: 75 mg/dL (ref 71–99)
Osmolality Calc: 281 mOsm/kg (ref 275–300)
Potassium: 3.9 mMol/L (ref 3.5–5.3)
Sodium: 138 mMol/L (ref 136–147)

## 2017-04-27 LAB — PT/INR
PT INR: 2.4 — ABNORMAL HIGH (ref 0.8–1.2)
PT: 23.7 s — ABNORMAL HIGH (ref 9.4–12.5)

## 2017-04-27 NOTE — Progress Notes (Signed)
Discharge instructions given to patient and family. Verbal understanding of follow up care. Patient released to family with all personal belongings and home medications.

## 2017-04-27 NOTE — Discharge Summary (Signed)
Discharge Summary    Date:04/27/2017   Patient Name: [name]  Attending Physician: Graciela Husbands, DO    Date of Admission:   04/25/2017    Date of Discharge:   04/27/2017    Admitting Diagnosis:   Acute on chronic diastolic congestive heart failure  Acute on chronic hypoxic respiratory failure  COPD asthma overlap syndrome    Discharge Dx:     Principal Diagnosis (Diagnosis after study, that is chiefly responsible for admission to inpatient status): Acute on chronic diastolic CHF (congestive heart failure), NYHA class 3  Active Hospital Problems    Diagnosis POA   . Principal Problem: Acute on chronic diastolic CHF (congestive heart failure), NYHA class 3 Yes   . Chronic respiratory failure with hypoxia, on home O2 therapy Not Applicable     Chronic   . Difficulty breathing Yes   . Aortic regurgitation Yes   . Permanent atrial fibrillation Yes     Chronic      Resolved Hospital Problems    Diagnosis POA   No resolved problems to display.       Treatment Team:   Treatment Team:   Attending Provider: Graciela Husbands, DO     Procedures performed:   Radiology: all results from this admission  Xr Chest Ap Portable    Result Date: 04/25/2017  IMPRESSION: 1. Stable cardiomegaly. 2. No acute pulmonary finding. 3. Chronic elevation of the left diaphragm. ReadingStation:[id number]      Reason for Admission:   Difficulty breathing  Hospital Course:     [name] Is a lovely 76 year old presented to the ED complaining of difficulty breathing. She has known diastolic congestive heart failure as well as COPD. She had had an insidious onset of this couple of weeks prior but wanted to hold off until after Christmas to be treated. She responded to intravenous Lasix with a good diuresis and symptomatic improvement. Chest x-ray and admission shows chronic elevation of the left hemidiaphragm, cardiac enlargement and early interstitial edema. Her ECG shows underlying atrial fibrillation mechanism with a demand pacemaker set at  60 with normal capture and sensing mechanism. Her normally conducted QRS complexes are consistent with LVH and possible chronic ischemia.  She also has asthma and COPD overlap. She is on nebulized albuterol at home. She needed this on occasion here as well. On the evening before discharge she did complain of some chest tightness and she was evaluated by her ED physician. An ECG was done and was unchanged. Her oxygen saturation is 98% on 2 L of oxygen. She does wear oxygen at home. Yesterday she walked around the perimeter of the second floor the hospital with physical therapy and did very well.    Condition at Discharge:   Stable and improved  Today:     BP 121/79   Pulse 64   Temp 97.3 F (36.3 C) (Oral)   Resp 17   Ht 1.626 m (5\' 4" )   Wt 74.5 kg (164 lb 4.8 oz)   SpO2 99%   BMI 28.20 kg/m   Ranges for the last 24 hours:  Temp:  [97.3 F (36.3 C)-98.3 F (36.8 C)] 97.3 F (36.3 C)  Heart Rate:  [60-82] 64  Resp Rate:  [16-17] 17  BP: (117-138)/(55-79) 121/79    Last set of labs     Recent Labs  Lab 04/26/17  0655   WBC 5.1   Hemoglobin 13.1   Hematocrit 40.3   PLT CT 91*  Recent Labs  Lab 04/27/17  0540   Sodium 138   Potassium 3.9   Chloride 98   CO2 32.0*   BUN 31*   Creatinine 0.97   EGFR 56*   Glucose 75   Calcium 10.4       Recent Labs  Lab 04/26/17  0655   Bilirubin, Total 1.2   Protein, Total 6.2   Albumin 3.8   ALT 13   AST (SGOT) 25       Micro / Labs / Path pending:     Unresulted Labs     None          Discharge Instructions For Providers     1. The patient admitted to excess sodium use prior to admission. She had been compliant with her medications. No medication changes were made.      Heart Failure:  Patient does not meet a need for further explanation of Reasons of treatment.    Discharge Instructions:     Follow-up Information     Rakaric, Valinda Hoar, MD .    Specialty:  Internal Medicine  Contact information:  [facility].  [address]  [place] [address]  [phone]                    Discharge Diet: 4 Gram Sodium (Salt) Restricted Diet        Full Code  Disposition:  ED Dismiss - Never Arrived     Discharge Medication List      Taking    albuterol (2.5 MG/3ML) 0.083% nebulizer solution  Dose:  2.5 mg  Commonly known as:  PROVENTIL  Take 2.5 mg by nebulization every 6 (six) hours as needed for Wheezing.     atenolol 50 MG tablet  Dose:  50 mg  Commonly known as:  TENORMIN  Take 50 mg by mouth daily.     benzonatate 200 MG capsule  Dose:  200 mg  Commonly known as:  TESSALON  Take 200 mg by mouth 3 (three) times daily as needed.     carbidopa-levodopa 25-100 MG per tablet  Dose:  1 tablet  Commonly known as:  SINEMET  Take 1 tablet by mouth 2 (two) times daily.     dilTIAZem 240 MG 24 hr capsule  Dose:  240 mg  Commonly known as:  TIAZAC  Take 240 mg by mouth 2 (two) times daily.     estradiol 0.1 MG/GM vaginal cream  Dose:  2 g  Commonly known as:  ESTRACE  Place 2 g vaginally daily.     fluorometholone 0.1 % ophthalmic suspension  Dose:  1 drop  Commonly known as:  EFLONE  1 drop 4 (four) times daily.     furosemide 40 MG tablet  Commonly known as:  LASIX  TAKE 1 TABLET BY MOUTH TWO TIMES DAILY ON AN EMPTY STOMACH     HYDROcodone-acetaminophen 5-325 MG per tablet  Dose:  2 tablet  Commonly known as:  NORCO  Take 2 tablets by mouth 2 (two) times daily.     ketoconazole 2 % cream  Commonly known as:  NIZORAL  Apply topically daily.     latanoprost 0.005 % ophthalmic solution  Dose:  1 drop  Commonly known as:  XALATAN  1 drop nightly.     omeprazole 20 MG capsule  Dose:  20 mg  Commonly known as:  PriLOSEC  Take 20 mg by mouth daily.     rosuvastatin 10 MG  tablet  Dose:  10 mg  Commonly known as:  CRESTOR  Take 10 mg by mouth daily.     spironolactone 50 MG tablet  Dose:  50 mg  Commonly known as:  ALDACTONE  Take 50 mg by mouth daily.     warfarin 3 MG tablet  Dose:  3 mg  What changed:  how much to take  Commonly known as:  COUMADIN  Take 1 tablet (3 mg total) by mouth daily.           Minutes spent coordinating discharge and reviewing discharge plan: 30 minutes      Signed by: Graciela Husbands, DO

## 2017-04-27 NOTE — Plan of Care (Signed)
Problem: Ineffective Gas Exchange  Goal: Effective breathing pattern  Interventions:  1. Maintain O2 saturation level per LIP order  2. Maintain CO2 level per LIP order  3. Monitor end tidal CO2 level per LIP order  4. Teach/reinforce use of ordered respiratory interventions (ie. CPAP, BiPAP, Incentive Spirometer, Acapella)  5. Monitor for sleep apnea  6. Monitor for medication induced respiratory depression   Outcome: Progressing

## 2017-04-27 NOTE — Plan of Care (Signed)
Goals met, discharge home.

## 2017-05-08 ENCOUNTER — Other Ambulatory Visit
Admission: RE | Admit: 2017-05-08 | Discharge: 2017-05-08 | Disposition: A | Discharge status: Home / Self Care | Payer: Medicare Other | Source: Ambulatory Visit | Attending: Internal Medicine | Admitting: Internal Medicine

## 2017-05-08 LAB — VH URINE DRUG SCREEN
Amphetamine: NEGATIVE
Barbiturates: NEGATIVE
Buprenorphine, Urine: NEGATIVE
Cannabinoids: NEGATIVE
Cocaine: NEGATIVE
Opiates: NEGATIVE
Phencyclidine: NEGATIVE
Urine Benzodiazepines: NEGATIVE
Urine Creatinine Random: 12.31 mg/dL
Urine Ecstasy Screen: NEGATIVE
Urine Fentanyl Screen: NEGATIVE
Urine Methadone Screen: NEGATIVE
Urine Oxycodone: NEGATIVE
Urine Specific Gravity: 1.007 (ref 1.001–1.040)
pH, Urine: 6.2 pH (ref 5.0–8.0)

## 2017-05-18 ENCOUNTER — Other Ambulatory Visit
Admission: RE | Admit: 2017-05-18 | Discharge: 2017-05-18 | Disposition: A | Discharge status: Home / Self Care | Payer: Medicare Other | Source: Ambulatory Visit | Attending: Internal Medicine | Admitting: Internal Medicine

## 2017-05-19 LAB — COMPREHENSIVE METABOLIC PANEL
ALT: 17 U/L (ref 0–55)
AST (SGOT): 23 U/L (ref 10–42)
Albumin/Globulin Ratio: 1.87 Ratio — ABNORMAL HIGH (ref 0.70–1.50)
Albumin: 4.3 gm/dL (ref 3.5–5.0)
Alkaline Phosphatase: 123 U/L (ref 40–145)
Anion Gap: 11.8 mMol/L (ref 7.0–18.0)
BUN / Creatinine Ratio: 29 Ratio (ref 10.0–30.0)
BUN: 27 mg/dL — ABNORMAL HIGH (ref 7–22)
Bilirubin, Total: 1 mg/dL (ref 0.1–1.2)
CO2: 29 mMol/L (ref 20.0–30.0)
Calcium: 10.8 mg/dL — ABNORMAL HIGH (ref 8.5–10.5)
Chloride: 101 mMol/L (ref 98–110)
Creatinine: 0.93 mg/dL (ref 0.60–1.20)
EGFR: 59 mL/min/{1.73_m2} — ABNORMAL LOW (ref 60–150)
Globulin: 2.3 gm/dL (ref 2.0–4.0)
Glucose: 106 mg/dL — ABNORMAL HIGH (ref 71–99)
Osmolality Calc: 281 mOsm/kg (ref 275–300)
Potassium: 3.8 mMol/L (ref 3.5–5.3)
Protein, Total: 6.6 gm/dL (ref 6.0–8.3)
Sodium: 138 mMol/L (ref 136–147)

## 2017-05-19 LAB — VH URINE DRUG SCREEN
Amphetamine: NEGATIVE
Barbiturates: NEGATIVE
Buprenorphine, Urine: NEGATIVE
Cannabinoids: NEGATIVE
Cocaine: NEGATIVE
Opiates: NEGATIVE
Phencyclidine: NEGATIVE
Urine Benzodiazepines: NEGATIVE
Urine Creatinine Random: 225.34 mg/dL
Urine Ecstasy Screen: NEGATIVE
Urine Fentanyl Screen: NEGATIVE
Urine Methadone Screen: NEGATIVE
Urine Oxycodone: NEGATIVE
Urine Specific Gravity: 1.028 (ref 1.001–1.040)
pH, Urine: 5.6 pH (ref 5.0–8.0)

## 2017-05-19 LAB — CBC AND DIFFERENTIAL
Basophils %: 0 % (ref 0.0–3.0)
Basophils Absolute: 0 10*3/uL (ref 0.0–0.3)
Eosinophils %: 1 % (ref 0.0–7.0)
Eosinophils Absolute: 0.1 10*3/uL (ref 0.0–0.8)
Hematocrit: 40.4 % (ref 36.0–48.0)
Hemoglobin: 13 gm/dL (ref 12.0–16.0)
Lymphocytes Absolute: 2.3 10*3/uL (ref 0.6–5.1)
Lymphocytes: 34 % (ref 15.0–46.0)
MCH: 31 pg (ref 28–35)
MCHC: 32 gm/dL (ref 32–36)
MCV: 97 fL (ref 80–100)
MPV: 10.8 fL — ABNORMAL HIGH (ref 6.0–10.0)
Monocytes Absolute: 0.3 10*3/uL (ref 0.1–1.7)
Monocytes: 5 % (ref 3.0–15.0)
Neutrophils %: 60 % (ref 42.0–78.0)
Neutrophils Absolute: 4.1 10*3/uL (ref 1.7–8.6)
PLT CT: 115 10*3/uL — ABNORMAL LOW (ref 130–440)
RBC: 4.18 10*6/uL (ref 3.80–5.00)
RDW: 12.8 % (ref 11.0–14.0)
WBC: 6.9 10*3/uL (ref 4.0–11.0)

## 2017-05-19 LAB — LIPID PANEL
Cholesterol: 156 mg/dL (ref 75–199)
Coronary Heart Disease Risk: 2.64
HDL: 59 mg/dL (ref 45–65)
LDL Calculated: 84 mg/dL
Triglycerides: 67 mg/dL (ref 10–150)
VLDL: 13 (ref 0–40)

## 2017-05-19 LAB — PT/INR
PT INR: 2.3 — ABNORMAL HIGH (ref 0.5–1.3)
PT: 22.3 s — ABNORMAL HIGH (ref 9.5–11.5)

## 2017-05-28 ENCOUNTER — Ambulatory Visit: Payer: Medicare Other

## 2017-05-28 DIAGNOSIS — I4821 Permanent atrial fibrillation: Secondary | ICD-10-CM

## 2017-05-28 DIAGNOSIS — I442 Atrioventricular block, complete: Secondary | ICD-10-CM

## 2017-08-17 IMAGING — DX DG CHEST 2V
2 series · 2 of 2 positions shown · non-contrast
Comparison: 02/13/2012

CLINICAL DATA: Shortness of breath.

EXAM:
CHEST  2 VIEW

[chest pa]
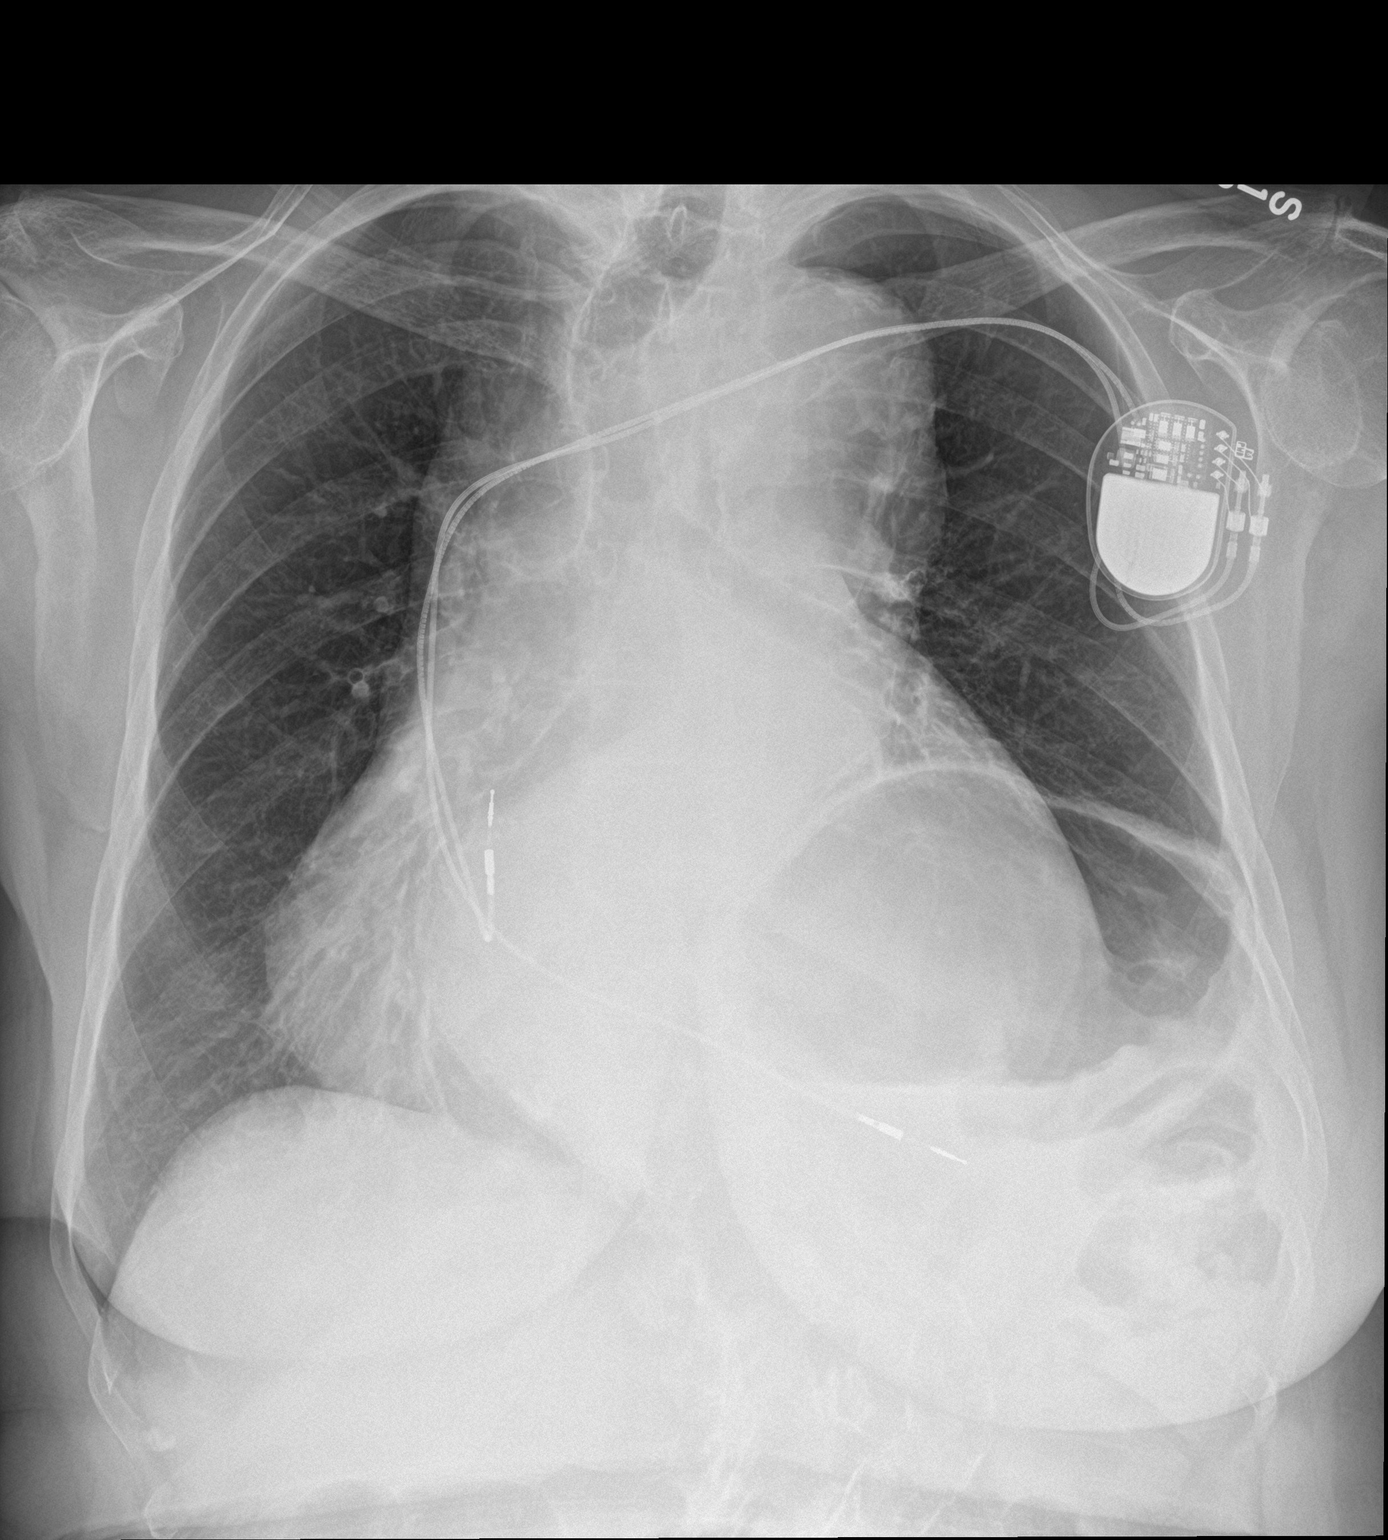

[chest lat]
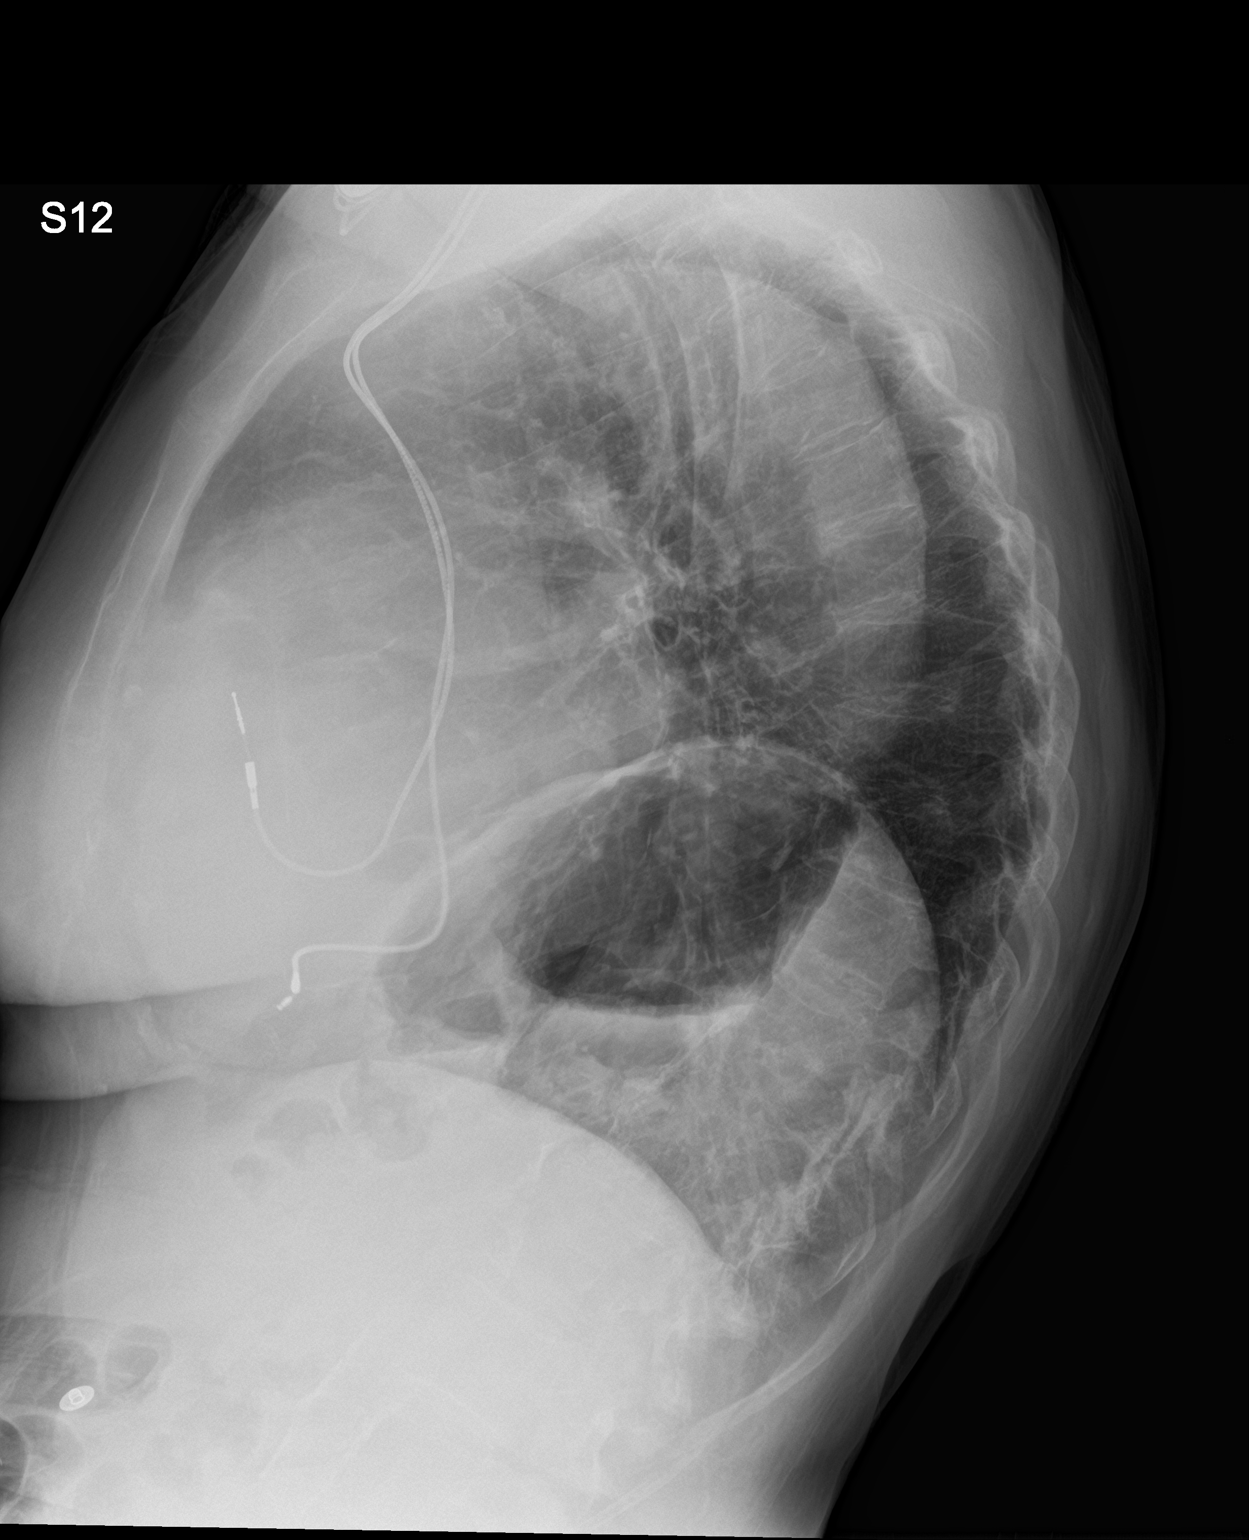

[2 of 2 positions shown; findings below may reference images not displayed]

FINDINGS: Gas dilated stomach or transverse colon. There is chronic elevation
of the left diaphragm. Cardiopericardial enlargement without Kerley
lines or effusion. Streaky right infrahilar density favoring
atelectasis/scarring. No air bronchogram. No pneumothorax.

Dual-chamber pacer leads from the left are in stable position.
IMPRESSION: 1. Cardiomegaly without failure.
2. Right infrahilar atelectasis or scarring, mild.
3. Chronic elevation of the left diaphragm.

## 2017-08-27 ENCOUNTER — Other Ambulatory Visit (RURAL_HEALTH_CENTER): Payer: Self-pay | Admitting: Internal Medicine

## 2017-09-17 ENCOUNTER — Other Ambulatory Visit
Admission: RE | Admit: 2017-09-17 | Discharge: 2017-09-17 | Disposition: A | Discharge status: Home / Self Care | Payer: Medicare Other | Source: Ambulatory Visit | Attending: Internal Medicine | Admitting: Internal Medicine

## 2017-09-18 LAB — COMPREHENSIVE METABOLIC PANEL
ALT: 20 U/L (ref 0–55)
AST (SGOT): 28 U/L (ref 10–42)
Albumin/Globulin Ratio: 2 Ratio (ref 0.80–2.00)
Albumin: 4.4 gm/dL (ref 3.5–5.0)
Alkaline Phosphatase: 133 U/L (ref 40–145)
Anion Gap: 17.3 mMol/L (ref 7.0–18.0)
BUN / Creatinine Ratio: 27.6 Ratio (ref 10.0–30.0)
BUN: 29 mg/dL — ABNORMAL HIGH (ref 7–22)
Bilirubin, Total: 0.9 mg/dL (ref 0.1–1.2)
CO2: 29 mMol/L (ref 20–30)
Calcium: 11.2 mg/dL — ABNORMAL HIGH (ref 8.5–10.5)
Chloride: 96 mMol/L — ABNORMAL LOW (ref 98–110)
Creatinine: 1.05 mg/dL (ref 0.60–1.20)
EGFR: 51 mL/min/{1.73_m2} — ABNORMAL LOW (ref 60–150)
Globulin: 2.2 gm/dL (ref 2.0–4.0)
Glucose: 97 mg/dL (ref 71–99)
Osmolality Calc: 281 mOsm/kg (ref 275–300)
Potassium: 4.3 mMol/L (ref 3.5–5.3)
Protein, Total: 6.6 gm/dL (ref 6.0–8.3)
Sodium: 138 mMol/L (ref 136–147)

## 2017-09-18 LAB — LIPID PANEL
Cholesterol: 157 mg/dL (ref 75–199)
Coronary Heart Disease Risk: 2.57
HDL: 61 mg/dL (ref 45–65)
LDL Calculated: 80 mg/dL
Triglycerides: 80 mg/dL (ref 10–150)
VLDL: 16 (ref 0–40)

## 2017-09-18 LAB — CBC AND DIFFERENTIAL
Basophils Absolute: 0 10*3/uL (ref 0.0–0.3)
Eosinophils %: 3 % (ref 0.0–7.0)
Eosinophils Absolute: 0.2 10*3/uL (ref 0.0–0.8)
Hematocrit: 41.5 % (ref 36.0–48.0)
Hemoglobin: 13.4 gm/dL (ref 12.0–16.0)
Lymphocytes Absolute: 1.3 10*3/uL (ref 0.6–5.1)
Lymphocytes: 20 % (ref 15.0–46.0)
MCH: 32 pg (ref 28–35)
MCHC: 32 gm/dL (ref 32–36)
MCV: 99 fL (ref 80–100)
MPV: 9.2 fL (ref 6.0–10.0)
Monocytes Absolute: 0.5 10*3/uL (ref 0.1–1.7)
Monocytes: 8 % (ref 3.0–15.0)
Myelocytes: 1 % — ABNORMAL HIGH (ref 0–0)
Neutrophils %: 68 % (ref 42.0–78.0)
Neutrophils Absolute: 4.6 10*3/uL (ref 1.7–8.6)
PLT CT: 126 10*3/uL — ABNORMAL LOW (ref 130–440)
RBC: 4.21 10*6/uL (ref 3.80–5.00)
RDW: 12.9 % (ref 11.0–14.0)
WBC: 6.6 10*3/uL (ref 4.0–11.0)

## 2017-09-18 LAB — PT/INR
PT INR: 2.2 — ABNORMAL HIGH (ref 0.5–1.3)
PT: 21.4 s — ABNORMAL HIGH (ref 9.5–11.5)

## 2017-09-20 LAB — VH URINE DRUG MONITORING OXYCODONE, QUANTITATIVE SOFT
Urine Noroxycodone: NEGATIVE ng/mL (ref <50)
Urine Oxycodone: NEGATIVE ng/mL (ref <50)
Urine Oxymorphone: NEGATIVE ng/mL (ref <50)

## 2017-11-14 ENCOUNTER — Emergency Department: Payer: Medicare Other

## 2017-11-14 ENCOUNTER — Emergency Department
Admission: EM | Admit: 2017-11-14 | Discharge: 2017-11-14 | Disposition: A | Discharge status: Home / Self Care | Payer: Medicare Other | Attending: Emergency Medicine | Admitting: Emergency Medicine

## 2017-11-14 DIAGNOSIS — M1000 Idiopathic gout, unspecified site: Secondary | ICD-10-CM

## 2017-11-14 DIAGNOSIS — M10071 Idiopathic gout, right ankle and foot: Secondary | ICD-10-CM | POA: Insufficient documentation

## 2017-11-14 LAB — CBC AND DIFFERENTIAL
Basophils %: 0.6 % (ref 0.0–3.0)
Basophils Absolute: 0.1 10*3/uL (ref 0.0–0.3)
Eosinophils %: 0.2 % (ref 0.0–7.0)
Eosinophils Absolute: 0 10*3/uL (ref 0.0–0.8)
Hematocrit: 37 % (ref 36.0–48.0)
Hemoglobin: 12.1 gm/dL (ref 12.0–16.0)
Lymphocytes Absolute: 1 10*3/uL (ref 0.6–5.1)
Lymphocytes: 10.8 % — ABNORMAL LOW (ref 15.0–46.0)
MCH: 31 pg (ref 28–35)
MCHC: 33 gm/dL (ref 31–36)
MCV: 93 fL (ref 80–100)
MPV: 9.6 fL (ref 6.0–10.0)
Monocytes Absolute: 0.8 10*3/uL (ref 0.1–1.7)
Monocytes: 8.4 % (ref 3.0–15.0)
Neutrophils %: 80.1 % — ABNORMAL HIGH (ref 42.0–78.0)
Neutrophils Absolute: 7.4 10*3/uL (ref 1.7–8.6)
PLT CT: 110 10*3/uL — ABNORMAL LOW (ref 130–440)
RBC: 3.97 10*6/uL (ref 3.80–5.00)
RDW: 13.8 % (ref 10.5–14.5)
WBC: 9.2 10*3/uL (ref 4.0–11.0)

## 2017-11-14 LAB — COMPREHENSIVE METABOLIC PANEL
ALT: 17 U/L (ref 0–55)
AST (SGOT): 20 U/L (ref 10–42)
Albumin/Globulin Ratio: 1.5 Ratio (ref 0.80–2.00)
Albumin: 3.9 gm/dL (ref 3.5–5.0)
Alkaline Phosphatase: 128 U/L (ref 40–145)
Anion Gap: 12.4 mMol/L (ref 7.0–18.0)
BUN / Creatinine Ratio: 28 Ratio (ref 10.0–30.0)
BUN: 30 mg/dL — ABNORMAL HIGH (ref 7–22)
Bilirubin, Total: 0.9 mg/dL (ref 0.1–1.2)
CO2: 28 mMol/L (ref 20.0–30.0)
Calcium: 10.5 mg/dL (ref 8.5–10.5)
Chloride: 101 mMol/L (ref 98–110)
Creatinine: 1.07 mg/dL (ref 0.60–1.20)
EGFR: 49 mL/min/{1.73_m2} — ABNORMAL LOW (ref 60–150)
Globulin: 2.6 gm/dL (ref 2.0–4.0)
Glucose: 149 mg/dL — ABNORMAL HIGH (ref 71–99)
Osmolality Calc: 283 mOsm/kg (ref 275–300)
Potassium: 4.2 mMol/L (ref 3.5–5.3)
Protein, Total: 6.4 gm/dL (ref 6.0–8.3)
Sodium: 137 mMol/L (ref 136–147)

## 2017-11-14 LAB — URIC ACID: Uric acid: 9.2 mg/dL — ABNORMAL HIGH (ref 2.6–6.0)

## 2017-11-14 MED ORDER — HYDROCODONE-ACETAMINOPHEN 5-325 MG PO TABS
1.00 | ORAL_TABLET | Freq: Four times a day (QID) | ORAL | 0 refills | Status: AC | PRN
Start: 2017-11-14 — End: 2017-11-21

## 2017-11-14 MED ORDER — PREDNISONE 20 MG PO TABS
40.00 mg | ORAL_TABLET | Freq: Every day | ORAL | 0 refills | Status: AC
Start: 2017-11-14 — End: 2017-11-19

## 2017-11-14 NOTE — Discharge Instructions (Signed)
Gout    Gout is an inflammation of a joint due to a build-up of gout crystals in the joint fluid. This occurs when there is an excessofuric acid (a normal waste product) in the body. Uric acid builds up in the body when the kidneys are unable to filter enough of it from the blood. This may occur with age. It is also associated with kidney disease. Gout occurs more often in people with obesity, diabetes, high blood pressure,orhighlevels offats in the blood. It may run in families. Gout tends to come and go. A flare up of gout is called an attack. Drinking alcohol or eating certain foods (such as shellfish orfoods with additives such as high-fructose corn syrup) may increase uric acid levels in the blood and cause a gout attack.  During a gout attack, the affected joint may become a hot, red, swollen and painful. If you have had one attack of gout, you are likely to have another. An attack of gout can be treated withmedicine. If these attacks become frequent,a daily medicine may be prescribed to help the kidneysremove uric acid from the body.  Home care  During a gout attack:   Rest painful joints. If gout affects the joints of your foot or leg, you may want to use crutches for the first few days to keep from bearing weight on the affected joint.   When sitting or lying down, raise the painful joint to a level higher than your heart.   Apply an ice pack (ice cubes in a plastic bag wrapped in a thin towel) over the injured area for 20 minutes every 1 to 2 hours the first day for pain relief. Continue this 3 to 4 times a day for swelling and pain.   Avoid alcohol and foods listed below (seePreventing attacks) during a gout attack. Drink extra fluid to help flush the uric acid through your kidneys.   If you were prescribed a medicine to treat gout, take it as your healthcare provider has instructed. Don't skip doses.   Takeanti-inflammatorymedicine as directed.   If pain medicines have been  prescribed, take them exactly as directed.   Preventing attacks   Minimize or avoidalcoholuse. Excess alcohol intake can cause a gout attack.   Limit these foods and beverages:  ? Organ meats, such as kidneys and liver  ? Certain seafoods (anchovies, sardines, shrimp, scallops, herring, mackerel)  ? Wild game, meat extracts and meat gravies  ? Foods and beverages sweetened with high-fructose corn syrup, such as sodas   Eat a healthy diet including low-fat and nonfat dairy, whole grains, and vegetables.   If you areoverweight, talk to your healthcare provider about a weight reduction plan. Avoidfastingorextreme low calorie diets(less than 900 calories perday). This will increase uric acid levels in the body.   If youhave diabetesorhigh blood pressure, work with your doctor to manage these conditions.   Protect the joint from injury. Trauma can trigger a gout attack.  Follow-up care  Follow up with your healthcare provider, or as advised.  When to seek medical advice  Call your healthcare provider if you have any of the following:   Fever over100.4F (38.C)with worsening joint pain   Increasing redness around the joint   Pain developing in another joint   Repeated vomiting, abdominal pain, or blood in the vomit or stool (black or red color)  Date Last Reviewed: 06/30/2015   2000-2019 [employer] [address], [place] [address]. All rights reserved. This   information is not intended as a substitute for professional medical care. Always follow your healthcare professional's instructions.

## 2017-11-14 NOTE — ED Student (Incomplete)
History     Chief Complaint   Patient presents with   . Leg Pain     Patient is a 77 y/o female who presents to the ED complaining of right leg pain for 1 day.  She reports that when she woke up yesterday morning she noticed pain in her right ankle and foot.  She describes the pain as achy and ranks it as a 10/10 at rest and reports that her pain is even worse when she puts weight on it.  She reports that the pain does not radiate and that it has been constant since the onset of her symptoms.  She also reports that she has had decreased ROM in her toes and that it hurts to move her foot at all.  Today when she woke up she reports the pain was even worse, which is why she came to the ED.  She denies trauma, numbness or tingling, recent travel, SOB, dizziness, fever, or fatigue.       The history is provided by the patient.   Leg Pain   Location:  Ankle and foot  Time since incident:  1 day  Injury: no    Ankle location:  R ankle  Foot location:  R foot  Pain details:     Quality:  Aching    Radiates to:  Does not radiate    Severity:  Severe    Onset quality:  Sudden    Duration:  1 day    Timing:  Constant    Progression:  Worsening  Chronicity:  New  Dislocation: no    Foreign body present:  No foreign bodies  Tetanus status:  Unknown  Relieved by:  Nothing  Worsened by:  Bearing weight  Ineffective treatments:  None tried  Associated symptoms: decreased ROM    Associated symptoms: no back pain, no fatigue, no fever, no itching, no neck pain, no numbness, no stiffness and no tingling        Past Medical History:   Diagnosis Date   . Aortic aneurysm    . Atrial fibrillation    . Chronic obstructive pulmonary disease    . Coronary artery disease    . Gastroesophageal reflux disease    . Hypertension    . Malignant neoplasm    . Pacemaker    . SSS (sick sinus syndrome)        Past Surgical History:   Procedure Laterality Date   . CARDIAC PACEMAKER PLACEMENT      2002   . HYSTERECTOMY         Family History   Problem  Relation Age of Onset   . Breast cancer Sister        Social History   Substance Use Topics   . Smoking status: Never Smoker   . Smokeless tobacco: Never Used   . Alcohol use No       Review of Systems   Constitutional: Negative for fatigue and fever.   HENT: Negative for congestion, ear pain, rhinorrhea and sore throat.    Eyes: Negative for photophobia and visual disturbance.   Respiratory: Negative for cough and shortness of breath.    Cardiovascular: Negative for chest pain.   Gastrointestinal: Positive for nausea. Negative for abdominal pain, constipation, diarrhea and vomiting.   Genitourinary: Negative for difficulty urinating, dysuria and hematuria.   Musculoskeletal: Negative for back pain, neck pain, neck stiffness and stiffness.   Skin: Negative for itching.   Neurological: Negative for dizziness,  syncope and headaches.       Physical Exam   BP 117/67   Pulse 88   Temp 97.9 F (36.6 C) (Tympanic)   Resp 20   Ht 1.638 m   Wt 71.2 kg   SpO2 97%   BMI 26.53 kg/m     Physical Exam   Constitutional: She is oriented to person, place, and time. She appears well-developed and well-nourished.   HENT:   Head: Normocephalic and atraumatic.   Right Ear: External ear normal.   Left Ear: External ear normal.   Nose: Nose normal.   Mouth/Throat: Oropharynx is clear and moist.   Eyes: Pupils are equal, round, and reactive to light. Conjunctivae and EOM are normal.   Neck: Normal range of motion. Neck supple.   Cardiovascular: Normal rate and regular rhythm.  Exam reveals no gallop and no friction rub.    No murmur heard.  Pulmonary/Chest: Effort normal and breath sounds normal. No respiratory distress. She has no wheezes.   Abdominal: Soft. She exhibits no mass. There is no tenderness. There is no rebound and no guarding.   Musculoskeletal: She exhibits edema.        Right ankle: She exhibits decreased range of motion and swelling. She exhibits no laceration. Tenderness.        Left ankle: She exhibits normal  range of motion and no swelling. No tenderness.        Feet:    Neurological: She is alert and oriented to person, place, and time.   Skin: Skin is warm. Capillary refill takes less than 2 seconds.       ED Course

## 2017-11-14 NOTE — ED Triage Notes (Signed)
pt c/o Rt leg pai since yesterday morning when awoke.  No injury.  Pain worse today and pt states can hardly move toes due to pain.

## 2017-11-14 NOTE — ED Triage Notes (Signed)
pt c/o Rt leg pai since yesterday morning when awoke.  No injury.  Pain worse today and pt states can hardly move toes due to pain.

## 2017-11-14 NOTE — ED Provider Notes (Signed)
Physician/Midlevel provider first contact with patient: 11/14/17 1206         History     Chief Complaint   Patient presents with   . Leg Pain     Patient is a 77 y/o female who presents to the ED complaining of right leg pain for 1 day.  She reports that when she woke up yesterday morning she noticed pain in her right ankle and foot.  She describes the pain as achy and ranks it as a 10/10 at rest and reports that her pain is even worse when she puts weight on it.  She reports that the pain does not radiate and that it has been constant since the onset of her symptoms.  She also reports that she has had decreased ROM in her toes and that it hurts to move her foot at all.  Today when she woke up she reports the pain was even worse, which is why she came to the ED.  She denies trauma, numbness or tingling, recent travel, SOB, dizziness, fever, or fatigue.      The history is provided by the patient. No language interpreter was used.            Past Medical History:   Diagnosis Date   . Aortic aneurysm    . Atrial fibrillation    . Chronic obstructive pulmonary disease    . Coronary artery disease    . Gastroesophageal reflux disease    . Hypertension    . Malignant neoplasm    . Pacemaker    . SSS (sick sinus syndrome)        Past Surgical History:   Procedure Laterality Date   . CARDIAC PACEMAKER PLACEMENT      2002   . HYSTERECTOMY         Family History   Problem Relation Age of Onset   . Breast cancer Sister        Social  Social History   Substance Use Topics   . Smoking status: Never Smoker   . Smokeless tobacco: Never Used   . Alcohol use No       .     Allergies   Allergen Reactions   . Adhesive [Wound Dressing Adhesive]    . Codeine    . Morphine    . Nitroglycerin    . Pain Patch [Menthol]        Home Medications     Med List Status:  In Progress Set By: Para Skeans, RN at 11/14/2017 12:07 PM                albuterol (PROVENTIL) (2.5 MG/3ML) 0.083% nebulizer solution     Take 2.5 mg by  nebulization every 6 (six) hours as needed for Wheezing.     atenolol (TENORMIN) 50 MG tablet     Take 50 mg by mouth daily.         benzonatate (TESSALON) 200 MG capsule     Take 200 mg by mouth 3 (three) times daily as needed.     carbidopa-levodopa (SINEMET) 25-100 MG per tablet     Take 1 tablet by mouth 2 (two) times daily.     diltiazem (TIAZAC) 240 MG 24 hr capsule     Take 240 mg by mouth 2 (two) times daily.        estradiol (ESTRACE) 0.1 MG/GM vaginal cream     Place 2 g vaginally daily.     fluorometholone (  EFLONE) 0.1 % ophthalmic suspension     1 drop 4 (four) times daily.     furosemide (LASIX) 40 MG tablet     TAKE 1 TABLET BY MOUTH TWO TIMES DAILY ON AN EMPTY STOMACH     ketoconazole (NIZORAL) 2 % cream     Apply topically daily.     latanoprost (XALATAN) 0.005 % ophthalmic solution     1 drop nightly.     omeprazole (PRILOSEC) 20 MG capsule     Take 20 mg by mouth daily.     rosuvastatin (CRESTOR) 10 MG tablet     Take 10 mg by mouth daily.     spironolactone (ALDACTONE) 50 MG tablet     Take 50 mg by mouth daily.     traMADol (ULTRAM) 50 MG tablet     Take 50 mg by mouth     warfarin (COUMADIN) 3 MG tablet     Take 1 tablet (3 mg total) by mouth daily.     Patient taking differently:  Take 2.5 mg by mouth daily.              Review of Systems   Constitutional: Negative.  Negative for chills, diaphoresis and fatigue.   HENT: Negative.  Negative for dental problem, rhinorrhea and sinus pain.    Respiratory: Negative.  Negative for cough, choking and shortness of breath.    Cardiovascular: Negative.  Negative for chest pain and leg swelling.   Gastrointestinal: Negative for abdominal pain, diarrhea, nausea and vomiting.   Genitourinary: Negative.  Negative for dysuria, flank pain, frequency and menstrual problem.   Musculoskeletal: Negative.  Negative for arthralgias and back pain.        Foot and ankle pain   All other systems reviewed and are negative.      Physical Exam    BP: 117/67, Heart Rate:  88, Temp: 97.9 F (36.6 C), Resp Rate: 20, SpO2: 97 %, Weight: 71.2 kg    Physical Exam   Constitutional: She is oriented to person, place, and time. She appears well-developed and well-nourished.   HENT:   Head: Normocephalic and atraumatic.   Neck: Normal range of motion. Neck supple.   Cardiovascular: Normal rate and regular rhythm.    Pulmonary/Chest: Effort normal and breath sounds normal. No respiratory distress.   Abdominal: Soft. Bowel sounds are normal. There is no tenderness. There is no guarding.   Musculoskeletal: She exhibits tenderness. She exhibits no edema or deformity.        Feet:    Neurological: She is alert and oriented to person, place, and time.   Skin: Skin is warm and dry.   Nursing note and vitals reviewed.        MDM and ED Course     ED Medication Orders     None         Xr Ankle Right 3+ Views    Result Date: 11/14/2017  No acute bony abnormalities. Soft tissue swelling. ReadingStation:[id number]    Xr Foot Right Ap Lateral And Oblique    Result Date: 11/14/2017  No acute bony abnormalities. Chronic degenerative changes as described above. ReadingStation:[id number]    Results     Procedure Component Value Units Date/Time    CMP [[id number]]  (Abnormal) Collected:  11/14/17 1311    Specimen:  Plasma Updated:  11/14/17 1340     Sodium 137 mMol/L      Potassium 4.2 mMol/L      Chloride 101 mMol/L  CO2 28.0 mMol/L      Calcium 10.5 mg/dL      Glucose 409 (H) mg/dL      Creatinine 8.11 mg/dL      BUN 30 (H) mg/dL      Protein, Total 6.4 gm/dL      Albumin 3.9 gm/dL      Alkaline Phosphatase 128 U/L      ALT 17 U/L      AST (SGOT) 20 U/L      Bilirubin, Total 0.9 mg/dL      Albumin/Globulin Ratio 1.50 Ratio      Anion Gap 12.4 mMol/L      BUN/Creatinine Ratio 28.0 Ratio      EGFR 49 (L) mL/min/1.28m2      Osmolality Calc 283 mOsm/kg      Globulin 2.6 gm/dL     Uric acid [[id number]]  (Abnormal) Collected:  11/14/17 1311    Specimen:  Plasma Updated:  11/14/17 1340     Uric acid 9.2 (H) mg/dL      CBC [[id number]]  (Abnormal) Collected:  11/14/17 1311    Specimen:  Blood from Blood Updated:  11/14/17 1323     WBC 9.2 K/cmm      RBC 3.97 M/cmm      Hemoglobin 12.1 gm/dL      Hematocrit 46.9 %      MCV 93 fL      MCH 31 pg      MCHC 33 gm/dL      RDW 62.9 %      PLT CT 110 (L) K/cmm      MPV 9.6 fL      NEUTROPHIL % 80.1 (H) %      Lymphocytes 10.8 (L) %      Monocytes 8.4 %      Eosinophils % 0.2 %      Basophils % 0.6 %      Neutrophils Absolute 7.4 K/cmm      Lymphocytes Absolute 1.0 K/cmm      Monocytes Absolute 0.8 K/cmm      Eosinophils Absolute 0.0 K/cmm      BASO Absolute 0.1 K/cmm             MDM    This patient presents to the Emergency Department with right ankle pain and swelling.   Evaluation and treatment for this patient was performed and revealed that there were no serious injuries identified in the ED, and no threat of loss of limb.  Sequelae of their injury were considered in the differential diagnosis including sprain, fracture, dislocation, arthritis, and infection. Any serious sequelae that would require admission and immediate surgical repair were thought unlikely, and the patient is stable for discharge home.  The diagnostic impression and plan and appropriate follow-up were discussed and agreed upon with the patient and/or family.  If performed the results of lab/radiology tests were reviewed and discussed and wrap was applied.  All questions were answered and concerns addressed.  Infection precautions have been given and the patient was warned to return immediately for worsening symptoms or any acute concerns and to follow up with primary care doctor within an appropriate time as discussed.    Procedures    Clinical Impression & Disposition     Clinical Impression  Final diagnoses:   Idiopathic gout, unspecified chronicity, unspecified site        ED Disposition     ED Disposition Condition Date/Time Comment    Discharge  Wed Nov 14, 2017  1:47 PM [name] [place] discharge to  home/self care.    Condition at disposition: Stable           New Prescriptions    HYDROCODONE-ACETAMINOPHEN (NORCO) 5-325 MG PER TABLET    Take 1 tablet by mouth every 6 (six) hours as needed for Pain    PREDNISONE (DELTASONE) 20 MG TABLET    Take 2 tablets (40 mg total) by mouth daily for 5 days      Multidisciplinary Visit: Provider(s) to Follow Up With            Rakaric, Valinda Hoar, MD   Specialty:  Internal Medicine   Relationship:  PCP - General    Medical [facility] Internal Medicine  [facility].  [address]  [place] [address]   Phone:  [phone]       Next Steps:  Schedule an appointment as soon as possible for a visit                   Reginia Naas, MD  11/14/17 1348

## 2017-11-24 ENCOUNTER — Other Ambulatory Visit (RURAL_HEALTH_CENTER): Payer: Self-pay | Admitting: Internal Medicine

## 2017-11-30 ENCOUNTER — Ambulatory Visit: Payer: Medicare Other

## 2017-11-30 DIAGNOSIS — I4821 Permanent atrial fibrillation: Secondary | ICD-10-CM

## 2017-11-30 DIAGNOSIS — I482 Chronic atrial fibrillation, unspecified: Secondary | ICD-10-CM

## 2017-11-30 DIAGNOSIS — I442 Atrioventricular block, complete: Secondary | ICD-10-CM

## 2018-01-15 ENCOUNTER — Other Ambulatory Visit
Admission: RE | Admit: 2018-01-15 | Discharge: 2018-01-15 | Disposition: A | Discharge status: Home / Self Care | Payer: Medicare Other | Source: Ambulatory Visit | Attending: Internal Medicine | Admitting: Internal Medicine

## 2018-01-15 LAB — VH URINE DRUG SCREEN
Amphetamine: NEGATIVE
Barbiturates: NEGATIVE
Buprenorphine, Urine: NEGATIVE
Cannabinoids: NEGATIVE
Cocaine: NEGATIVE
Opiates: NEGATIVE
Phencyclidine: NEGATIVE
Urine Benzodiazepines: POSITIVE — AB
Urine Creatinine Random: 116.22 mg/dL
Urine Ecstasy Screen: NEGATIVE
Urine Fentanyl Screen: NEGATIVE
Urine Methadone Screen: NEGATIVE
Urine Oxycodone: NEGATIVE
Urine Specific Gravity: 1.017 (ref 1.001–1.040)
pH, Urine: 6.2 pH (ref 5.0–8.0)

## 2018-01-15 LAB — PT/INR
PT INR: 1.7 — ABNORMAL HIGH (ref 0.5–1.3)
PT: 16.6 s — ABNORMAL HIGH (ref 9.5–11.5)

## 2018-01-16 LAB — LIPID PANEL
Cholesterol: 172 mg/dL (ref 75–199)
Coronary Heart Disease Risk: 2.57
HDL: 67 mg/dL — ABNORMAL HIGH (ref 45–65)
LDL Calculated: 90 mg/dL
Triglycerides: 75 mg/dL (ref 10–150)
VLDL: 15 (ref 0–40)

## 2018-01-16 LAB — COMPREHENSIVE METABOLIC PANEL
ALT: 23 U/L (ref 0–55)
AST (SGOT): 26 U/L (ref 10–42)
Albumin/Globulin Ratio: 2.14 Ratio — ABNORMAL HIGH (ref 0.80–2.00)
Albumin: 4.5 gm/dL (ref 3.5–5.0)
Alkaline Phosphatase: 153 U/L — ABNORMAL HIGH (ref 40–145)
Anion Gap: 17.3 mMol/L (ref 7.0–18.0)
BUN / Creatinine Ratio: 34.3 Ratio — ABNORMAL HIGH (ref 10.0–30.0)
BUN: 35 mg/dL — ABNORMAL HIGH (ref 7–22)
Bilirubin, Total: 1 mg/dL (ref 0.1–1.2)
CO2: 28 mMol/L (ref 20–30)
Calcium: 11.3 mg/dL — ABNORMAL HIGH (ref 8.5–10.5)
Chloride: 97 mMol/L — ABNORMAL LOW (ref 98–110)
Creatinine: 1.02 mg/dL (ref 0.60–1.20)
EGFR: 52 mL/min/{1.73_m2} — ABNORMAL LOW (ref 60–150)
Globulin: 2.1 gm/dL (ref 2.0–4.0)
Glucose: 103 mg/dL — ABNORMAL HIGH (ref 71–99)
Osmolality Calc: 284 mOsm/kg (ref 275–300)
Potassium: 4.3 mMol/L (ref 3.5–5.3)
Protein, Total: 6.6 gm/dL (ref 6.0–8.3)
Sodium: 138 mMol/L (ref 136–147)

## 2018-01-16 LAB — CBC WITH MANUAL DIFFERENTIAL
Basophils %: 0 % (ref 0.0–3.0)
Basophils Absolute: 0 10*3/uL (ref 0.0–0.3)
Eosinophils %: 2 % (ref 0.0–7.0)
Eosinophils Absolute: 0.2 10*3/uL (ref 0.0–0.8)
Hematocrit: 44.5 % (ref 36.0–48.0)
Hemoglobin: 13.6 gm/dL (ref 12.0–16.0)
Lymphocytes Absolute: 2 10*3/uL (ref 0.6–5.1)
Lymphocytes: 23 % (ref 15.0–46.0)
MCH: 30 pg (ref 28–35)
MCHC: 31 gm/dL — ABNORMAL LOW (ref 32–36)
MCV: 98 fL (ref 80–100)
MPV: 8.5 fL (ref 6.0–10.0)
Monocytes Absolute: 1.1 10*3/uL (ref 0.1–1.7)
Monocytes: 12 % (ref 3.0–15.0)
Neutrophils %: 63 % (ref 42.0–78.0)
Neutrophils Absolute: 5.6 10*3/uL (ref 1.7–8.6)
PLT CT: 127 10*3/uL — ABNORMAL LOW (ref 130–440)
RBC: 4.52 10*6/uL (ref 3.80–5.00)
RDW: 14 % (ref 11.0–14.0)
WBC: 8.9 10*3/uL (ref 4.0–11.0)

## 2018-01-25 ENCOUNTER — Encounter: Payer: Self-pay | Admitting: Interventional Cardiology

## 2018-02-25 ENCOUNTER — Other Ambulatory Visit (RURAL_HEALTH_CENTER): Payer: Self-pay | Admitting: Internal Medicine

## 2018-04-09 ENCOUNTER — Encounter: Payer: Medicare Other | Admitting: Interventional Cardiology

## 2018-04-19 ENCOUNTER — Telehealth: Payer: Self-pay

## 2018-04-19 NOTE — Telephone Encounter (Signed)
NOTES AND LABS RECEIVED AND SCANNED TO CHART

## 2018-04-19 NOTE — Telephone Encounter (Signed)
Requested pcp ov labs are in epic

## 2018-04-23 DIAGNOSIS — I2510 Atherosclerotic heart disease of native coronary artery without angina pectoris: Secondary | ICD-10-CM | POA: Insufficient documentation

## 2018-04-25 DIAGNOSIS — I495 Sick sinus syndrome: Secondary | ICD-10-CM | POA: Insufficient documentation

## 2018-04-26 ENCOUNTER — Encounter: Payer: Self-pay | Admitting: Interventional Cardiology

## 2018-04-26 ENCOUNTER — Other Ambulatory Visit: Payer: Self-pay | Admitting: Interventional Cardiology

## 2018-04-26 ENCOUNTER — Ambulatory Visit: Payer: Medicare Other

## 2018-04-26 ENCOUNTER — Ambulatory Visit: Payer: Medicare Other | Admitting: Interventional Cardiology

## 2018-04-26 VITALS — BP 160/88 | HR 62 | Ht 64.0 in | Wt 174.8 lb

## 2018-04-26 DIAGNOSIS — Z950 Presence of cardiac pacemaker: Secondary | ICD-10-CM

## 2018-04-26 DIAGNOSIS — I4821 Permanent atrial fibrillation: Secondary | ICD-10-CM

## 2018-04-26 DIAGNOSIS — I495 Sick sinus syndrome: Secondary | ICD-10-CM

## 2018-04-26 DIAGNOSIS — I2510 Atherosclerotic heart disease of native coronary artery without angina pectoris: Secondary | ICD-10-CM

## 2018-04-26 NOTE — Progress Notes (Signed)
Cardiology Follow-Up      Patient Name: [name]   Date of Birth: 04-Nov-1940    Provider: Herbert Deaner, MD     Patient Care Team:  Ladona Horns, MD as PCP - General (Internal Medicine)    Chief Complaint: Sick Sinus Syndrome      History of Present Illness   [name] is a 77 y.o. female being seen today for F/U  of sick sinus syndrome and atrial fibrillation. I initially placed her pacemaker 17 years ago and then I performed her generator change in 2016.     She had her device interrogated today which showed programmed to VVI. Paces 55% of the time, 8 years battery life remaining.    She was hospitalized in December 2018 for 3-4 days for fluid retention. She is still followed by Dr. Fransisca Kaufmann; he manages her warfarin as well as follows her lab work. She will see him again next month. She is on chronic oxygen and increases how much she uses as needed; she also uses inhalers and a nebulizer as needed.    Today she complains of consistent wheezing that occurs with daily activities and is alleviated with rest. She also experiences significant BLE edema; she is currently taking Lasix 40 mg but denies frequent urination and does not really respond to her Lasix.     She briefly mentioned bilateral neck soreness but did not go into further detail.    She was born and raised in [place]; she still currently lives in [place].     HPI from 03/20/17: [name] is a 77 y.o. female being seen today for F/U of sick sinus syndrome and atrial fibrillation. I initially placed her pacemaker 16 years ago. She was admitted for atrial fibrillation this past spring, which was related to a COPD exacerbation. She is still wearing oxygen. She is feeling well. She lives with her daughter. Medications reviewed and appropriate. She remains on Coumadin. INR is checked regularly.     Plan from 03/20/17: No changes. F/U with me in the device clinic in one year. Remote check in 3, 6, and 9 months.     PM Gen Change Dual/BiV  03/22/15 (Dr. Pecola Leisure): INDICATION: ERI. Please note that the patient has developed chronic atrial fibrillation and is programmed VVI; however she will get a DDD pacemaker but it will be programmed again to VVI. PROCEDURE: Informed consent was signed.  Patient brought to cardiac catheterization lab room five, prepped and draped usual sterile fashion.  Versed and fentanyl were used for sedation and lidocaine for local anesthesia.  A 4 cm incision made.  The old pacemaker was explanted.  The right atrial lead is a Medtronic [id number] and was sensing atrial fibrillation.  The ventricular lead was a Medtronic [id number], had a pacing threshold 0.6 V at 0.6 msec, impedance of 1289 ohms.  R-waves were 5.4.  The pocket was flushed with Sheria Lang solution and to the leads was placed a Medtronic [id number] dual-chamber pacemaker.  Appropriate function was observed.  The wound was closed with 2-0 Vicryl for the subcutaneous layer and staples for the skin.  The patient left the room in satisfactory condition without immediate complication.    Review of Systems   Constitution: Negative for activity change, appetite change, fatigue, fever, and unexpected weight change  HENT: Negative for trouble swallowing, and voice change  Eyes: Negative for any visual disturbances  Respiratory: Negative for apnea and/or awakening with sob, chest tightness, and sob  Cardiovascular: Negative  for chest pain/discomfort, leg or ankle swelling, difficulty breathing lying down, and palpitations  Gastrointestinal: Negative for abdominal pain, blood in stool, nausea, and vomiting  Endocrine: Negative for cold intolerance, heat intolerance, and polydipsia  GU: Negative for flank pain, frequent urination, and hematuria  Musculoskeletal: Negative for gait problems, joint swelling, neck pain, and pain or heaviness in legs  Skin: Negative for rash, wound, and ulcers on legs/feet  Neurological: Negative for dizziness, headaches, and syncope  Hematologic: Negative  for bruises  Psychiatric: Negative for anxiety, and sleep disturbance    Comprehensive review of systems performed by me. Unless otherwise noted, all systems are negative.    Past Medical History     Preloaded 04/23/18    1. Nonobstructive coronary artery disease by catheterization; 2008 Dr. Duffy Rhody  2. Acute on chronic congestive heart failure, unspecified congestive heart failure type   a) Echo 05/04/06- Mild AR. Mild TR with estimated RVSP  b) Echo 03/23/11- Mild MR and AR. Moderate TR. Normal LV systolic function. LV concentric hypertrophy. Moderate elevation of RVSP  3. Paroxysmal atrial fibrillation  a) Echo 12/28/10- LV normal size and systolic function. mild concentric hypertrophy. Mild LAE. RV size normal with normal systolic function. RA normal size. Aortic root mildly enlarged.   b) Echo 11/06/13- LV concentric hypertrophy. Normal LV systolic function. Mild-moderate AR. Severe TR. Slightly elevated estimated RVSP  4. Hyperkalemia   5. Glaucoma, right eye  6. On home oxygen therapy   7. Aortic regurgitation   8. SSS, AFib w RVR, symptomatic bradycardia  a) S/p MDT dual chamber pacer placed by Dr. Pecola Leisure 04/03/2001, left pect.  b) S/p gen change 03/22/15  9. Acquired kyphoscoliosis  10. Degenerative disc disease; deformity of thoracic vertebra  11. Urge incontinence of urine  12. Osteoporosis  13. History of preserved left ventricular function  a) Echo 11/06/13- Concentric LVH. Normal LV systolic function. Mild-moderate AR. Severe TR. Slightly elevated estimated RVSP.  14. Dizziness  a) Tilt table 05/03/06- Negative study for neurocardiogenic syncope    Past Surgical History     Past Surgical History:   Procedure Laterality Date   . CARDIAC PACEMAKER PLACEMENT      2002   . HYSTERECTOMY         Family History     Family History   Problem Relation Age of Onset   . Breast cancer Sister        Social History     Social History     Tobacco Use   . Smoking status: Never Smoker   . Smokeless tobacco: Never Used    Substance Use Topics   . Alcohol use: No   . Drug use: No       Allergies     is allergic to adhesive [wound dressing adhesive]; codeine; morphine; nitroglycerin; and pain patch [menthol].    Medications       Current Outpatient Medications:   .  albuterol (PROVENTIL) (2.5 MG/3ML) 0.083% nebulizer solution, Take 2.5 mg by nebulization every 6 (six) hours as needed for Wheezing., Disp: , Rfl:   .  atenolol (TENORMIN) 50 MG tablet, Take 50 mg by mouth daily.  , Disp: , Rfl:   .  benzonatate (TESSALON) 200 MG capsule, Take 200 mg by mouth 3 (three) times daily as needed., Disp: , Rfl:   .  carbidopa-levodopa (SINEMET) 25-100 MG per tablet, Take 1 tablet by mouth 2 (two) times daily., Disp: , Rfl:   .  diltiazem (TIAZAC) 240 MG  24 hr capsule, Take 240 mg by mouth 2 (two) times daily.  , Disp: , Rfl:   .  esomeprazole (NEXIUM) 40 MG capsule, TAKE 1 CAPSULE BY MOUTH EVERY DAY-STOP OMEPRAZOLE, Disp: , Rfl: 3  .  estradiol (ESTRACE) 0.1 MG/GM vaginal cream, Place 2 g vaginally daily., Disp: , Rfl:   .  fluorometholone (EFLONE) 0.1 % ophthalmic suspension, 1 drop 4 (four) times daily., Disp: , Rfl:   .  furosemide (LASIX) 40 MG tablet, TAKE 1 TABLET BY MOUTH TWO TIMES DAILY ON AN EMPTY STOMACH, Disp: 180 tablet, Rfl: 0  .  ketoconazole (NIZORAL) 2 % cream, Apply topically daily., Disp: , Rfl:   .  latanoprost (XALATAN) 0.005 % ophthalmic solution, 1 drop nightly., Disp: , Rfl:   .  rosuvastatin (CRESTOR) 10 MG tablet, Take 10 mg by mouth daily., Disp: , Rfl:   .  spironolactone (ALDACTONE) 50 MG tablet, Take 50 mg by mouth daily., Disp: , Rfl:   .  warfarin (COUMADIN) 3 MG tablet, Take 1 tablet (3 mg total) by mouth daily. (Patient taking differently: Take 2.5 mg by mouth daily.  ), Disp: 30 tablet, Rfl: 0    Physical Exam     Visit Vitals  BP 160/88 (BP Site: Right arm, Patient Position: Sitting)   Pulse 62   Ht 1.626 m (5\' 4" )   Wt 79.3 kg (174 lb 12.8 oz)   BMI 30.00 kg/m     Vitals:    04/26/18 1005   BP: 160/88   BP  Site: Right arm   Patient Position: Sitting   Pulse: 62   Weight: 79.3 kg (174 lb 12.8 oz)   Height: 1.626 m (5\' 4" )     Wt Readings from Last 3 Encounters:   04/26/18 79.3 kg (174 lb 12.8 oz)   11/14/17 71.2 kg (157 lb)   04/27/17 74.5 kg (164 lb 4.8 oz)     Constitutional -  Well appearing, and in no distress  Eyes - Pupils equal and reactive, extraocular eye movements intact, sclera anicteric  Oropharynx - Moist mucous membranes  Respiratory - Expiratory wheezing. On portable 02    Cardiovascular system -    Regular rate and rhythm    Normal S1, S2    No murmurs, rubs, gallops   Pacemaker site well-healed    Jugular venous pulse is normal        Carotid upstroke normal, no carotid bruits auscultated     Neurological - Alert, oriented, no focal neurological deficits  Extremities -  No clubbing or cyanosis. 2+ BLE edema. Stasis changes  Skin - Warm and dry  Psych- Appropriate affect    Labs     Lab Results   Component Value Date/Time    WBC 8.9 01/15/2018 10:25 AM    RBC 4.52 01/15/2018 10:25 AM    HGB 13.6 01/15/2018 10:25 AM    HCT 44.5 01/15/2018 10:25 AM    PLT 127 (L) 01/15/2018 10:25 AM    TSH 0.12 (L) 11/07/2013 05:54 AM       Lab Results   Component Value Date/Time    NA 138 01/15/2018 10:25 AM    K 4.3 01/15/2018 10:25 AM    CL 97 (L) 01/15/2018 10:25 AM    CO2 28 01/15/2018 10:25 AM    GLU 103 (H) 01/15/2018 10:25 AM    BUN 35 (H) 01/15/2018 10:25 AM    CREAT 1.02 01/15/2018 10:25 AM    PROT 6.6 01/15/2018 10:25 AM  ALKPHOS 153 (H) 01/15/2018 10:25 AM    AST 26 01/15/2018 10:25 AM    ALT 23 01/15/2018 10:25 AM       Lab Results   Component Value Date/Time    CHOL 172 01/15/2018 10:25 AM    TRIG 75 01/15/2018 10:25 AM    HDL 67 (H) 01/15/2018 10:25 AM    LDL 90 01/15/2018 10:25 AM     Cardiogenics:     EKG:     Impression and Recommendations:     1. Nonobstructive atherosclerosis of coronary artery    2. Pacemaker    3. SSS (sick sinus syndrome)    4. Permanent atrial fibrillation    - Her biggest  complaint today is wheezing and edema. She does not respond to her Lasix. She sees Dr. Fransisca Kaufmann next week; would consider adding prn metolazone one half hour prior to Lasix to reduce her edema.  - Otherwise I will see her back in one year.    - Keep remote device checks as recommended.     This note was scribed by Francis Dowse on behalf of Herbert Deaner, MD.     Electronically signed by:   Herbert Deaner, MD  04/26/2018

## 2018-05-16 ENCOUNTER — Other Ambulatory Visit
Admission: RE | Admit: 2018-05-16 | Discharge: 2018-05-16 | Disposition: A | Discharge status: Home / Self Care | Payer: Medicare Other | Source: Ambulatory Visit | Attending: Internal Medicine | Admitting: Internal Medicine

## 2018-05-16 ENCOUNTER — Ambulatory Visit: Payer: Medicare Other

## 2018-05-16 DIAGNOSIS — I351 Nonrheumatic aortic (valve) insufficiency: Secondary | ICD-10-CM

## 2018-05-16 DIAGNOSIS — I712 Thoracic aortic aneurysm, without rupture, unspecified: Secondary | ICD-10-CM

## 2018-05-16 DIAGNOSIS — E875 Hyperkalemia: Secondary | ICD-10-CM

## 2018-05-16 DIAGNOSIS — I5033 Acute on chronic diastolic (congestive) heart failure: Secondary | ICD-10-CM

## 2018-05-16 DIAGNOSIS — Z45010 Encounter for checking and testing of cardiac pacemaker pulse generator [battery]: Secondary | ICD-10-CM

## 2018-05-16 DIAGNOSIS — Z9981 Dependence on supplemental oxygen: Secondary | ICD-10-CM

## 2018-05-16 DIAGNOSIS — I4821 Permanent atrial fibrillation: Secondary | ICD-10-CM

## 2018-05-16 DIAGNOSIS — I2510 Atherosclerotic heart disease of native coronary artery without angina pectoris: Secondary | ICD-10-CM

## 2018-05-16 DIAGNOSIS — I509 Heart failure, unspecified: Secondary | ICD-10-CM

## 2018-05-16 LAB — CREATININE, SERUM
Creatinine: 1.06 mg/dL (ref 0.60–1.20)
EGFR: 50 mL/min/{1.73_m2} — ABNORMAL LOW (ref 60–150)

## 2018-05-16 LAB — ALKALINE PHOSPHATASE: Alkaline Phosphatase: 166 U/L — ABNORMAL HIGH (ref 40–145)

## 2018-05-16 LAB — CHOLESTEROL, TOTAL: Cholesterol: 142 mg/dL (ref 75–199)

## 2018-05-16 LAB — PT/INR
PT INR: 1.6 — ABNORMAL HIGH (ref 0.5–1.3)
PT: 16.1 s — ABNORMAL HIGH (ref 9.5–11.5)

## 2018-05-16 LAB — ALT: ALT: 15 U/L (ref 0–55)

## 2018-05-16 NOTE — Progress Notes (Signed)
Date Specimen Drawn:  05/16/2018   Time Specimen Drawn:  9:20 AM   Test(s) Ordered:  SEE ORDERS   Disposition:  n/a   Patient's Tolerance:  Good   Location Specimen Drawn:  Right antecubital

## 2018-05-30 ENCOUNTER — Other Ambulatory Visit (RURAL_HEALTH_CENTER): Payer: Self-pay | Admitting: Internal Medicine

## 2018-06-13 ENCOUNTER — Other Ambulatory Visit: Payer: Self-pay | Admitting: Cardiovascular Disease

## 2018-06-13 ENCOUNTER — Ambulatory Visit: Payer: Medicare Other

## 2018-06-13 DIAGNOSIS — I495 Sick sinus syndrome: Secondary | ICD-10-CM

## 2018-06-13 DIAGNOSIS — I4821 Permanent atrial fibrillation: Secondary | ICD-10-CM

## 2018-09-04 ENCOUNTER — Other Ambulatory Visit: Payer: Self-pay | Admitting: Interventional Cardiology

## 2018-12-03 ENCOUNTER — Other Ambulatory Visit: Payer: Self-pay

## 2018-12-03 DIAGNOSIS — I495 Sick sinus syndrome: Secondary | ICD-10-CM

## 2019-01-24 ENCOUNTER — Emergency Department: Payer: Medicare Other

## 2019-01-24 ENCOUNTER — Observation Stay: Payer: Medicare Other

## 2019-01-24 ENCOUNTER — Inpatient Hospital Stay
Admission: EM | Admit: 2019-01-24 | Discharge: 2019-01-30 | DRG: 291 | Disposition: A | Discharge status: Home / Self Care | Payer: Medicare Other | Attending: Internal Medicine | Admitting: Internal Medicine

## 2019-01-24 DIAGNOSIS — J45901 Unspecified asthma with (acute) exacerbation: Secondary | ICD-10-CM | POA: Diagnosis present

## 2019-01-24 DIAGNOSIS — R110 Nausea: Secondary | ICD-10-CM | POA: Diagnosis not present

## 2019-01-24 DIAGNOSIS — J441 Chronic obstructive pulmonary disease with (acute) exacerbation: Secondary | ICD-10-CM

## 2019-01-24 DIAGNOSIS — Z885 Allergy status to narcotic agent status: Secondary | ICD-10-CM

## 2019-01-24 DIAGNOSIS — G20 Parkinson's disease: Secondary | ICD-10-CM | POA: Diagnosis present

## 2019-01-24 DIAGNOSIS — I4821 Permanent atrial fibrillation: Secondary | ICD-10-CM | POA: Diagnosis present

## 2019-01-24 DIAGNOSIS — E785 Hyperlipidemia, unspecified: Secondary | ICD-10-CM | POA: Diagnosis present

## 2019-01-24 DIAGNOSIS — K5903 Drug induced constipation: Secondary | ICD-10-CM | POA: Diagnosis not present

## 2019-01-24 DIAGNOSIS — I429 Cardiomyopathy, unspecified: Secondary | ICD-10-CM | POA: Diagnosis present

## 2019-01-24 DIAGNOSIS — I110 Hypertensive heart disease with heart failure: Principal | ICD-10-CM | POA: Diagnosis present

## 2019-01-24 DIAGNOSIS — Z888 Allergy status to other drugs, medicaments and biological substances status: Secondary | ICD-10-CM

## 2019-01-24 DIAGNOSIS — I712 Thoracic aortic aneurysm, without rupture, unspecified: Secondary | ICD-10-CM | POA: Diagnosis present

## 2019-01-24 DIAGNOSIS — Z79899 Other long term (current) drug therapy: Secondary | ICD-10-CM

## 2019-01-24 DIAGNOSIS — Z90710 Acquired absence of both cervix and uterus: Secondary | ICD-10-CM

## 2019-01-24 DIAGNOSIS — I5033 Acute on chronic diastolic (congestive) heart failure: Secondary | ICD-10-CM | POA: Diagnosis present

## 2019-01-24 DIAGNOSIS — Z91048 Other nonmedicinal substance allergy status: Secondary | ICD-10-CM

## 2019-01-24 DIAGNOSIS — Z803 Family history of malignant neoplasm of breast: Secondary | ICD-10-CM

## 2019-01-24 DIAGNOSIS — N179 Acute kidney failure, unspecified: Secondary | ICD-10-CM | POA: Diagnosis present

## 2019-01-24 DIAGNOSIS — R519 Headache, unspecified: Secondary | ICD-10-CM | POA: Diagnosis not present

## 2019-01-24 DIAGNOSIS — Z859 Personal history of malignant neoplasm, unspecified: Secondary | ICD-10-CM

## 2019-01-24 DIAGNOSIS — I509 Heart failure, unspecified: Secondary | ICD-10-CM | POA: Diagnosis present

## 2019-01-24 DIAGNOSIS — Z7901 Long term (current) use of anticoagulants: Secondary | ICD-10-CM

## 2019-01-24 DIAGNOSIS — I2510 Atherosclerotic heart disease of native coronary artery without angina pectoris: Secondary | ICD-10-CM | POA: Diagnosis present

## 2019-01-24 DIAGNOSIS — K219 Gastro-esophageal reflux disease without esophagitis: Secondary | ICD-10-CM | POA: Diagnosis present

## 2019-01-24 DIAGNOSIS — Z950 Presence of cardiac pacemaker: Secondary | ICD-10-CM

## 2019-01-24 DIAGNOSIS — J9621 Acute and chronic respiratory failure with hypoxia: Secondary | ICD-10-CM | POA: Diagnosis present

## 2019-01-24 DIAGNOSIS — Z9981 Dependence on supplemental oxygen: Secondary | ICD-10-CM

## 2019-01-24 DIAGNOSIS — I272 Pulmonary hypertension, unspecified: Secondary | ICD-10-CM | POA: Diagnosis present

## 2019-01-24 DIAGNOSIS — I2729 Other secondary pulmonary hypertension: Secondary | ICD-10-CM | POA: Diagnosis present

## 2019-01-24 DIAGNOSIS — I495 Sick sinus syndrome: Secondary | ICD-10-CM | POA: Diagnosis present

## 2019-01-24 LAB — CBC AND DIFFERENTIAL
Basophils %: 0.6 % (ref 0.0–3.0)
Basophils Absolute: 0 10*3/uL (ref 0.0–0.3)
Eosinophils %: 1.1 % (ref 0.0–7.0)
Eosinophils Absolute: 0.1 10*3/uL (ref 0.0–0.8)
Hematocrit: 38.9 % (ref 36.0–48.0)
Hemoglobin: 11.8 gm/dL — ABNORMAL LOW (ref 12.0–16.0)
Lymphocytes Absolute: 0.8 10*3/uL (ref 0.6–5.1)
Lymphocytes: 13.1 % — ABNORMAL LOW (ref 15.0–46.0)
MCH: 30 pg (ref 28–35)
MCHC: 30 gm/dL — ABNORMAL LOW (ref 31–36)
MCV: 99 fL (ref 80–100)
MPV: 8.9 fL (ref 6.0–10.0)
Monocytes Absolute: 0.6 10*3/uL (ref 0.1–1.7)
Monocytes: 9.1 % (ref 3.0–15.0)
Neutrophils %: 76.2 % (ref 42.0–78.0)
Neutrophils Absolute: 4.9 10*3/uL (ref 1.7–8.6)
PLT CT: 99 10*3/uL — ABNORMAL LOW (ref 130–440)
RBC: 3.93 10*6/uL (ref 3.80–5.00)
RDW: 14.5 % (ref 10.5–14.5)
WBC: 6.4 10*3/uL (ref 4.0–11.0)

## 2019-01-24 LAB — COMPREHENSIVE METABOLIC PANEL
ALT: 7 U/L (ref 0–55)
AST (SGOT): 22 U/L (ref 10–42)
Albumin/Globulin Ratio: 1.52 Ratio (ref 0.80–2.00)
Albumin: 4.1 gm/dL (ref 3.5–5.0)
Alkaline Phosphatase: 131 U/L (ref 40–145)
Anion Gap: 13.9 mMol/L (ref 7.0–18.0)
BUN / Creatinine Ratio: 36.2 Ratio — ABNORMAL HIGH (ref 10.0–30.0)
BUN: 51 mg/dL — ABNORMAL HIGH (ref 7–22)
Bilirubin, Total: 0.4 mg/dL (ref 0.1–1.2)
CO2: 31 mMol/L — ABNORMAL HIGH (ref 20.0–30.0)
Calcium: 10.7 mg/dL — ABNORMAL HIGH (ref 8.5–10.5)
Chloride: 100 mMol/L (ref 98–110)
Creatinine: 1.41 mg/dL — ABNORMAL HIGH (ref 0.60–1.20)
EGFR: 35 mL/min/{1.73_m2} — ABNORMAL LOW (ref 60–150)
Globulin: 2.7 gm/dL (ref 2.0–4.0)
Glucose: 89 mg/dL (ref 71–99)
Osmolality Calculated: 293 mOsm/kg (ref 275–300)
Potassium: 4.9 mMol/L (ref 3.5–5.3)
Protein, Total: 6.8 gm/dL (ref 6.0–8.3)
Sodium: 140 mMol/L (ref 136–147)

## 2019-01-24 LAB — VH ARTERIAL BLOOD GAS
Allen's Test: POSITIVE
Base Excess, Arterial: 5.4 mMol/L — ABNORMAL HIGH (ref <=2.0)
Carboxyhemoglobin: 0.5 % (ref 0.0–19.9)
HCO3, ABG: 30.5 mMol/L — ABNORMAL HIGH (ref 20.0–29.0)
O2 Lit Per Min: 2 L/min
O2 Sat, Arterial: 94.8 % — ABNORMAL LOW (ref 96.0–100.0)
pCO2, Arterial: 49.5 mm Hg — ABNORMAL HIGH (ref 35–45)
pH, Arterial: 7.41 pH (ref 7.35–7.45)
pO2, Arterial: 84 mm Hg (ref 75–100)

## 2019-01-24 LAB — VH URINALYSIS WITH MICROSCOPIC AND CULTURE IF INDICATED
Bilirubin, UA: NEGATIVE mg/dL
Blood, UA: NEGATIVE mg/dL
Glucose, UA: NEGATIVE mg/dL
Ketones UA: NEGATIVE mg/dL
Leukocyte Esterase, UA: NEGATIVE Leu/uL
Nitrite, UA: NEGATIVE
Protein, UR: NEGATIVE mg/dL
Urine Specific Gravity: 1.01 (ref 1.001–1.040)
Urobilinogen, UA: 0.2 mg/dL
pH, Urine: 5 pH (ref 5.0–8.0)

## 2019-01-24 LAB — D-DIMER, QUANTITATIVE: D-Dimer: 0.79 mg/L FEU — ABNORMAL HIGH (ref 0.19–0.52)

## 2019-01-24 LAB — B-TYPE NATRIURETIC PEPTIDE: B-Natriuretic Peptide: 388.7 pg/mL — ABNORMAL HIGH (ref 0.0–100.0)

## 2019-01-24 LAB — PT/INR
PT INR: 3.6 — ABNORMAL HIGH (ref 0.9–1.2)
PT: 36.8 s — ABNORMAL HIGH (ref 9.4–11.3)

## 2019-01-24 LAB — TROPONIN I
Troponin I: 0.04 ng/mL — ABNORMAL HIGH (ref 0.00–0.02)
Troponin I: 0.02 ng/mL (ref 0.00–0.02)

## 2019-01-24 LAB — LACTIC ACID, PLASMA: Lactic Acid: 1.3 mMol/L (ref 0.5–1.9)

## 2019-01-24 MED ORDER — NON FORMULARY
2.00 | Freq: Once | Status: DC
Start: 2019-01-24 — End: 2019-01-24

## 2019-01-24 MED ORDER — METHYLPREDNISOLONE SODIUM SUCC 40 MG IJ SOLR
80.00 mg | Freq: Once | INTRAMUSCULAR | Status: AC
Start: 2019-01-24 — End: 2019-01-24
  Administered 2019-01-24: 17:00:00 80 mg via INTRAVENOUS

## 2019-01-24 MED ORDER — SODIUM CHLORIDE (PF) 0.9 % IJ SOLN
3.00 mL | Freq: Three times a day (TID) | INTRAMUSCULAR | Status: DC
Start: 2019-01-24 — End: 2019-01-30
  Administered 2019-01-25 – 2019-01-30 (×11): 3 mL via INTRAVENOUS
  Filled 2019-01-24 (×6): qty 10

## 2019-01-24 MED ORDER — IPRATROPIUM BROMIDE 0.02 % IN SOLN
RESPIRATORY_TRACT | Status: AC
Start: 2019-01-24 — End: ?
  Filled 2019-01-24: qty 2.5

## 2019-01-24 MED ORDER — CARBIDOPA-LEVODOPA 25-100 MG PO TABS
1.0000 | ORAL_TABLET | Freq: Two times a day (BID) | ORAL | Status: DC
Start: 2019-01-24 — End: 2019-01-27
  Administered 2019-01-24 – 2019-01-27 (×6): 1 via ORAL
  Filled 2019-01-24 (×6): qty 1

## 2019-01-24 MED ORDER — ALBUTEROL SULFATE (2.5 MG/3ML) 0.083% IN NEBU
INHALATION_SOLUTION | RESPIRATORY_TRACT | Status: AC
Start: 2019-01-24 — End: ?
  Filled 2019-01-24: qty 3

## 2019-01-24 MED ORDER — PANTOPRAZOLE SODIUM 40 MG PO TBEC
40.00 mg | DELAYED_RELEASE_TABLET | Freq: Every morning | ORAL | Status: DC
Start: 2019-01-25 — End: 2019-01-30
  Administered 2019-01-25 – 2019-01-30 (×6): 40 mg via ORAL
  Filled 2019-01-24 (×6): qty 1

## 2019-01-24 MED ORDER — ALBUTEROL SULFATE HFA 108 (90 BASE) MCG/ACT IN AERS
INHALATION_SPRAY | RESPIRATORY_TRACT | Status: AC
Start: 2019-01-24 — End: ?
  Filled 2019-01-24: qty 8

## 2019-01-24 MED ORDER — IOHEXOL 350 MG/ML IV SOLN
140.00 mL | Freq: Once | INTRAVENOUS | Status: AC | PRN
Start: 2019-01-24 — End: 2019-01-24
  Administered 2019-01-24: 20:00:00 140 mL via INTRAVENOUS

## 2019-01-24 MED ORDER — LACTATED RINGERS IV SOLN
Freq: Once | INTRAVENOUS | Status: AC
Start: 2019-01-24 — End: 2019-01-25

## 2019-01-24 MED ORDER — ALBUTEROL SULFATE HFA 108 (90 BASE) MCG/ACT IN AERS
2.00 | INHALATION_SPRAY | Freq: Once | RESPIRATORY_TRACT | Status: AC
Start: 2019-01-24 — End: 2019-01-24
  Administered 2019-01-24: 18:00:00 2 via RESPIRATORY_TRACT

## 2019-01-24 MED ORDER — METHYLPREDNISOLONE SODIUM SUCC 40 MG IJ SOLR
40.00 mg | Freq: Every day | INTRAMUSCULAR | Status: DC
Start: 2019-01-25 — End: 2019-01-25
  Administered 2019-01-25: 08:00:00 40 mg via INTRAVENOUS
  Filled 2019-01-24: qty 1

## 2019-01-24 MED ORDER — METHYLPREDNISOLONE SODIUM SUCC 125 MG IJ SOLR
INTRAMUSCULAR | Status: AC
Start: 2019-01-24 — End: ?
  Filled 2019-01-24: qty 2

## 2019-01-24 MED ORDER — FLUTICASONE FUROATE-VILANTEROL 100-25 MCG/INH IN AEPB
1.00 | INHALATION_SPRAY | Freq: Every morning | RESPIRATORY_TRACT | Status: DC
Start: 2019-01-24 — End: 2019-01-25
  Administered 2019-01-25: 1 via RESPIRATORY_TRACT
  Filled 2019-01-24: qty 14

## 2019-01-24 MED ORDER — VH WARFARIN THERAPY PLACEHOLDER
1.00 | Status: DC
Start: 2019-01-24 — End: 2019-01-30

## 2019-01-24 MED ORDER — METHYLPREDNISOLONE SODIUM SUCC 40 MG IJ SOLR
INTRAMUSCULAR | Status: AC
Start: 2019-01-24 — End: ?
  Filled 2019-01-24: qty 2

## 2019-01-24 MED ORDER — NALOXONE HCL 0.4 MG/ML IJ SOLN (WRAP)
0.40 mg | INTRAMUSCULAR | Status: DC | PRN
Start: 2019-01-24 — End: 2019-01-30

## 2019-01-24 MED ORDER — ROSUVASTATIN CALCIUM 10 MG PO TABS
10.0000 mg | ORAL_TABLET | Freq: Every day | ORAL | Status: DC
Start: 2019-01-25 — End: 2019-01-30
  Administered 2019-01-25 – 2019-01-30 (×6): 10 mg via ORAL
  Filled 2019-01-24 (×6): qty 1

## 2019-01-24 MED ORDER — ALBUTEROL-IPRATROPIUM 2.5-0.5 (3) MG/3ML IN SOLN
3.00 mL | Freq: Four times a day (QID) | RESPIRATORY_TRACT | Status: DC
Start: 2019-01-24 — End: 2019-01-26
  Administered 2019-01-25 – 2019-01-26 (×4): 3 mL via RESPIRATORY_TRACT
  Filled 2019-01-24 (×3): qty 3

## 2019-01-24 MED ORDER — LATANOPROST 0.005 % OP SOLN
1.00 [drp] | Freq: Every evening | OPHTHALMIC | Status: DC
Start: 2019-01-24 — End: 2019-01-30
  Administered 2019-01-24 – 2019-01-29 (×6): 1 [drp] via OPHTHALMIC
  Filled 2019-01-24: qty 1

## 2019-01-24 MED ORDER — LACTATED RINGERS IV BOLUS
250.00 mL | Freq: Once | INTRAVENOUS | Status: AC
Start: 2019-01-24 — End: 2019-01-24
  Administered 2019-01-24: 19:00:00 250 mL via INTRAVENOUS

## 2019-01-24 MED ORDER — BENZONATATE 100 MG PO CAPS
200.00 mg | ORAL_CAPSULE | ORAL | Status: DC | PRN
Start: 2019-01-24 — End: 2019-01-25
  Administered 2019-01-25: 200 mg via ORAL
  Filled 2019-01-24: qty 2

## 2019-01-24 MED ORDER — WARFARIN SODIUM 2.5 MG PO TABS
2.5000 mg | ORAL_TABLET | Freq: Every day | ORAL | Status: DC
Start: 2019-01-25 — End: 2019-01-30
  Administered 2019-01-26 – 2019-01-29 (×4): 2.5 mg via ORAL
  Filled 2019-01-24 (×4): qty 1

## 2019-01-24 NOTE — Progress Notes (Signed)
Warfarin consult request    78yo F      Indication: A fib    Hx: hypoxic resp failure (COPD), AKI, CHF, HLD, HTN, Parkinson's    Warfarin ordered 3 mg qd (21 mg/wk)  Patient takes 5 mg Wed/Thur and 2.5 mg remaining days (22.5 mg/wk)    Current INR   3.6 (01/24/19)    Plan: Warfarin protocol non-[facility] modified level I         To start when in therapeutic range (2.0-3.0) per INR dosing protocol

## 2019-01-24 NOTE — H&P (Addendum)
Medicine History & Physical   [facility]  [employer]   Patient Name: [name],[name] LOS: 0 days   Attending Physician: Chinita Greenland, MD PCP: Ladona Horns, MD      Assessment and Plan:                                                                Acute on chronic hypoxic respiratory failure likely due to COPD exacerbation. Does not use inhaler at home. 2lpm o2 at home. Has cardiomegaly on CT chest. Difficult to assess volume status thru telemedicine but Dr. Nedra Hai stated to me that she did not look volume overload but rather intravascular depletion.  -Solu medrol IV 40mg  daily.  -Duoneb.  -not on home ICS-LABA. Will start breo.    AKI. Likely due to prerenal azotemia rather than cardio-renal. UA unremarkable. Patient received IV contrast loading for CTPE in the ED. Further precipitated by lasix and aldactone.  -watch for contract induced nephropathy.  -will start LR at 75. If no improvement in kidney function. Might need to re-assess volume status.    Afib. Low HR with low BP.  Holding Atenolol and Diltiazem.  Warfarin per pharmacy.    Heart failure. Difficult to do volume status. However, ED provider assessment does not seem to correlate with decompensation.  -Holding Lasix    Dyslipidemia.  -Crestor.    Hypertension.  -Holding spironolactone in the light of AKI.    Parkinson disease.  -Sinemet.    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 Stable in size.  -Outpatient surveillance monitoring.    DVT PPx: Anticoagulated   Dispo: Observation  Code: full code     History of Presenting Illness                                CC: worsening shortness of breath.  [name] is a 78 y.o. female patient who presented to ED because of worsening shortness of breath while at home. Patient denied fever, chill, cough, sputum production, abd pain, chest pain, diarrhea, or sick contact. Patient has been compliant with her medication. In the ED. Patient  required 4lpm o2 which was higher than her normal 2lpm at home. CT of chest showed stable aortic aneurysm but significant cardiomegaly. Her INR was 3.6 and troponin trended down from 0.04->0.02. Dr. Nedra Hai in the ED thought that patient was in copd exacerbation and she looked euvolemic when I asked him since I have lack of physical examination thru tele medicine. Patient would be admitted under obs for COPD exacerbation.  Past Medical History:   Diagnosis Date    Aortic aneurysm     Atrial fibrillation     Chronic obstructive pulmonary disease     Coronary artery disease     Gastroesophageal reflux disease     Hypertension     Malignant neoplasm     Pacemaker     SSS (sick sinus syndrome)      Past Surgical History:   Procedure Laterality Date    CARDIAC PACEMAKER PLACEMENT      2002    HYSTERECTOMY       Family History   Problem Relation Age of Onset  Breast cancer Sister      Social History     Tobacco Use    Smoking status: Never Smoker    Smokeless tobacco: Never Used   Substance Use Topics    Alcohol use: No    Drug use: No       Subjective   Review of Systems:  Review of systems as noted in HPI. Full 12 point ROS otherwise negative.         Objective   Physical Exam:   Physical examination is limited.  Vitals: T: ,  BP:97/69, HR:61, RR:20, SaO2:98%    1) General Appearance: Alert and awake. In no acute distress. On 4l o2  2) Eyes: Pink conjunctiva, anicteric sclera. Pupils are equally reactive to light.  3) Neck: Supple, with full range of motion. Trachea is central, no JVD noted  4) Chest: Diminished breath sound and expiratory wheezing.  5) CVS: normal rate and regular rhythm, with no murmurs.  6) Abdomen: Soft, non-tender, no palpable mass. Bowel sounds normal. No CVA tenderness  7) Extremities: trace pitting edema, pulses palpable, no calf swelling and no gross deformity.  8) Skin: Warm, dry with normal skin turgor, no rash  9) Neurological: Cranial nerves II-XII intact. No gross focal motor  or sensory deficits noted.  10) Psychiatric: Affect is appropriate. No hallucinations.    EKG: Per my review shows Afib  Chest Xray: Per my review shows Cardiomegaly and clear lung    Patient Vitals for the past 12 hrs:   BP Pulse Resp   01/24/19 2037 97/69     01/24/19 1901 115/69     01/24/19 1854 103/70     01/24/19 1823 127/68     01/24/19 1740  61 20   01/24/19 1716 128/56     01/24/19 1646 117/74     01/24/19 1631 130/73     01/24/19 1624 142/77  (!) 24     Weight Monitoring 04/25/2017 04/25/2017 04/26/2017 04/27/2017 11/14/2017 04/26/2018 01/24/2019   Height 162.6 cm 162.6 cm - - 163.8 cm 162.6 cm -   Height Method Stated Stated - - Stated - -   Weight 77.565 kg 77.066 kg 75.206 kg 74.526 kg 71.215 kg 79.289 kg 87.7 kg   Weight Method Stated Standing Scale Standing Scale Standing Scale Stated - Bed Scale   BMI (calculated) 29.4 kg/m2 29.2 kg/m2 - - 26.6 kg/m2 30.1 kg/m2 -          Recent Results (from the past 24 hour(s))   CBC and differential    Collection Time: 01/24/19  4:30 PM   Result Value Ref Range    WBC 6.4 4.0 - 11.0 K/cmm    RBC 3.93 3.80 - 5.00 M/cmm    Hemoglobin 11.8 (L) 12.0 - 16.0 gm/dL    Hematocrit 16.1 09.6 - 48.0 %    MCV 99 80 - 100 fL    MCH 30 28 - 35 pg    MCHC 30 (L) 31 - 36 gm/dL    RDW 04.5 40.9 - 81.1 %    PLT CT 99 (L) 130 - 440 K/cmm    MPV 8.9 6.0 - 10.0 fL    Neutrophils % 76.2 42.0 - 78.0 %    Lymphocytes 13.1 (L) 15.0 - 46.0 %    Monocytes 9.1 3.0 - 15.0 %    Eosinophils % 1.1 0.0 - 7.0 %    Basophils % 0.6 0.0 - 3.0 %    Neutrophils Absolute 4.9 1.7 -  8.6 K/cmm    Lymphocytes Absolute 0.8 0.6 - 5.1 K/cmm    Monocytes Absolute 0.6 0.1 - 1.7 K/cmm    Eosinophils Absolute 0.1 0.0 - 0.8 K/cmm    Basophils Absolute 0.0 0.0 - 0.3 K/cmm   Comprehensive metabolic panel    Collection Time: 01/24/19  4:30 PM   Result Value Ref Range    Sodium 140 136 - 147 mMol/L    Potassium 4.9 3.5 - 5.3 mMol/L    Chloride 100 98 - 110 mMol/L    CO2 31.0 (H) 20.0 - 30.0 mMol/L     Calcium 10.7 (H) 8.5 - 10.5 mg/dL    Glucose 89 71 - 99 mg/dL    Creatinine 9.14 (H) 0.60 - 1.20 mg/dL    BUN 51 (H) 7 - 22 mg/dL    Protein, Total 6.8 6.0 - 8.3 gm/dL    Albumin 4.1 3.5 - 5.0 gm/dL    Alkaline Phosphatase 131 40 - 145 U/L    ALT 7 0 - 55 U/L    AST (SGOT) 22 10 - 42 U/L    Bilirubin, Total 0.4 0.1 - 1.2 mg/dL    Albumin/Globulin Ratio 1.52 0.80 - 2.00 Ratio    Anion Gap 13.9 7.0 - 18.0 mMol/L    BUN / Creatinine Ratio 36.2 (H) 10.0 - 30.0 Ratio    EGFR 35 (L) 60 - 150 mL/min/1.58m2    Osmolality Calculated 293 275 - 300 mOsm/kg    Globulin 2.7 2.0 - 4.0 gm/dL   D-dimer, quantitative    Collection Time: 01/24/19  4:30 PM   Result Value Ref Range    D-Dimer 0.79 (H) 0.19 - 0.52 mg/L FEU   B-type Natriuretic Peptide    Collection Time: 01/24/19  4:30 PM   Result Value Ref Range    B-Natriuretic Peptide 388.7 (H) 0.0 - 100.0 pg/mL   Troponin I    Collection Time: 01/24/19  4:30 PM   Result Value Ref Range    Troponin I 0.04 (H) 0.00 - 0.02 ng/mL   Prothrombin time/INR    Collection Time: 01/24/19  4:30 PM   Result Value Ref Range    PT 36.8 (H) 9.4 - 11.3 sec    PT INR 3.6 (H) 0.9 - 1.2   ECG 12 lead    Collection Time: 01/24/19  5:21 PM   Result Value Ref Range    Patient Age 38 years    Patient DOB 1940-11-15     Patient Height      Patient Weight      Interpretation Text       Atrial flutter with varied AV block,   Repol abnrm suggests ischemia, anterolateral  Compared to ECG 04/27/2017 04:35:12  Early repolarization now present  Atrial fibrillation no longer present  Ventricular-paced complex(es) or rhythm no longer present  Left ventricular hypertrophy no longer present  Possible ischemia still present      Physician Interpreter      Ventricular Rate 75 //min    QRS Duration 107 ms    P-R Interval  ms    Q-T Interval 397 ms    Q-T Interval(Corrected) 444 ms    P Wave Axis  deg    QRS Axis -11 deg    T Axis 264 years   Urinalysis w Microscopic and Culture if Indicated    Collection Time: 01/24/19   5:25 PM    Specimen: Urine, Random   Result Value Ref Range    Color, UA  Yellow Colorless,Yellow    Clarity, UA Clear Clear    Urine Specific Gravity 1.010 1.001 - 1.040    pH, Urine 5.0 5.0 - 8.0 pH    Protein, UR Negative Negative mg/dL    Glucose, UA Negative Negative mg/dL    Ketones UA Negative Negative mg/dL    Bilirubin, UA Negative Negative mg/dL    Blood, UA Negative Negative mg/dL    Nitrite, UA Negative Negative    Urobilinogen, UA 0.2 0.2 mg/dL    Leukocyte Esterase, UA Negative Negative Leu/uL   Arterial Blood Gas    Collection Time: 01/24/19  6:30 PM   Result Value Ref Range    Allen's Test Positive     Draw Site - ABG Left Radial Artery     O2 Lit Per Min 2 L/min    VIA NC     pH, Arterial 7.41 7.35 - 7.45 pH    pCO2, Arterial 49.5 (H) 35 - 45 mm Hg    pO2, Arterial 84 75 - 100 mm Hg    O2 Sat, Arterial 94.8 (L) 96.0 - 100.0 %    HCO3, ABG 30.5 (H) 20.0 - 29.0 mMol/L    Base Excess, Arterial 5.4 (H) -2.0 - 2.0 mMol/L    Carboxyhemoglobin 0.5 0.0 - 19.9 %   Troponin I    Collection Time: 01/24/19  7:10 PM   Result Value Ref Range    Troponin I 0.02 0.00 - 0.02 ng/mL        Allergies   Allergen Reactions    Adhesive [Wound Dressing Adhesive]     Codeine     Morphine     Nitroglycerin     Pain Patch [Menthol]       Ct Angiogram Chest (pe)    Result Date: 01/24/2019  1.  INSUFFICIENT OPACIFICATION TO EVALUATE FOR PE. 2.  SEVERE CARDIOMEGALY, WITH TRACE PERICARDIAL FLUID. 3.  ANEURYSMAL THORACIC AORTA PER ABOVE. ReadingStation:[id number]    Xr Chest Ap Portable    Result Date: 01/24/2019  No acute cardiopulmonary findings given portable technique. ReadingStation:[id number]     Home Medications             albuterol (PROVENTIL) (2.5 MG/3ML) 0.083% nebulizer solution     Take 2.5 mg by nebulization every 6 (six) hours as needed for Wheezing.     atenolol (TENORMIN) 50 MG tablet     Take 50 mg by mouth daily.         benzonatate (TESSALON) 200 MG capsule     Take 200 mg by mouth 3 (three) times  daily as needed.     carbidopa-levodopa (SINEMET) 25-100 MG per tablet     Take 1 tablet by mouth 2 (two) times daily.     diltiazem (TIAZAC) 240 MG 24 hr capsule     Take 240 mg by mouth 2 (two) times daily.        esomeprazole (NEXIUM) 40 MG capsule     TAKE 1 CAPSULE BY MOUTH EVERY DAY-STOP OMEPRAZOLE     estradiol (ESTRACE) 0.1 MG/GM vaginal cream     Place 2 g vaginally daily.     fluorometholone (EFLONE) 0.1 % ophthalmic suspension     Place 1 drop into the left eye 4 (four) times daily        furosemide (LASIX) 40 MG tablet     TAKE 1 TABLET BY MOUTH TWO TIMES DAILY ON AN EMPTY STOMACH     ketoconazole (NIZORAL) 2 % cream  Apply topically daily.     latanoprost (XALATAN) 0.005 % ophthalmic solution     Place 1 drop into both eyes nightly        rosuvastatin (CRESTOR) 10 MG tablet     Take 10 mg by mouth daily.     spironolactone (ALDACTONE) 50 MG tablet     Take 50 mg by mouth daily.     warfarin (COUMADIN) 3 MG tablet     Take 1 tablet (3 mg total) by mouth daily.     Patient taking differently: Take 2.5 mg by mouth daily 5mg  Wed and Thurs, and 2.5mg  the other days of the week.           Meds given in the ED:  Medications   methylPREDNISolone sodium succinate (Solu-MEDROL) injection 80 mg (80 mg Intravenous Given 01/24/19 1725)   albuterol sulfate HFA (PROVENTIL) inhaler 2 puff (2 puffs Inhalation Given 01/24/19 1740)   lactated ringers bolus 250 mL (0 mLs Intravenous Stopped 01/24/19 1950)   iohexol (OMNIPAQUE) 350 MG/ML injection 140 mL (140 mLs Intravenous Imaging Agent Given 01/24/19 2020)   albuterol (PROVENTIL) (2.5 MG/3ML) 0.083% nebulizer solution (has no administration in time range)   ipratropium (ATROVENT) 0.02 % nebulizer solution (has no administration in time range)   albuterol sulfate HFA (PROVENTIL) 108 (90 Base) MCG/ACT inhaler (has no administration in time range)   methylPREDNISolone sodium succinate (Solu-MEDROL) 125 MG injection (has no administration in time range)   methylPREDNISolone  sodium succinate (Solu-MEDROL) 40 MG injection (has no administration in time range)      Time Spent:     Chinita Greenland, MD     01/24/19,9:23 PM   MRN: [id number]                                      CSN: [id number] DOB: 03/11/1941

## 2019-01-24 NOTE — ED Notes (Signed)
RT at bedside to collect ABG 

## 2019-01-24 NOTE — ED Notes (Signed)
Getting pt ready for admission, assessed RAC IV site. Area cool and swollen around IV site, removed and Dr Nedra Hai in to assess. Warm pack placed on arm, xray ordered. Pt denies pain.

## 2019-01-24 NOTE — ED Triage Notes (Signed)
Pt brought in via EMS with c/o SOB. EMS reports she was out in town today doing things with no o2 on. She usually wears 2L at home and 5L when she's up and about. EMS gave her a Duoneb en route. Pt placed on 4L o2 in room.

## 2019-01-24 NOTE — ED Notes (Signed)
MD at bedside. 

## 2019-01-24 NOTE — ED Notes (Signed)
RT at bedside.

## 2019-01-24 NOTE — ED Notes (Signed)
Daughter called in and spoke to another nurse, told her that pt cannot take IV dye. Clarified with pt and she said that's not true, she was told not to have a MRI due to her pacemaker. Dr Nedra Hai notified.

## 2019-01-24 NOTE — ED Provider Notes (Addendum)
History     Chief Complaint   Patient presents with    Shortness of Breath     HPI   This very pleasant patient with an extremely complicated medical history presents with 2-3 days of worsening shortness of breath. Her breathing has been worse than her normal for a couple months, but she has been tolerating it. Last night her breathing was bad enough that it frightened her. She hates coming to the hospital but today she decided that she doesn't want to have another night like last night so she presented to the ER. Denies chest pain.    Interestingly, she reports to me that while her symptoms improve when she uses her inhaler, over the last couple of months her arthritis has prevented her from squeezing the MDI. Also, albuterol makes her jittery and she doesn't like the way this makes her feel. She is on O2 at home; baseline is 2L but she is allowed to self-titrate to as high as 5L based on symptoms.     She has a h/o HFpEF although it has been >5 years since her last echo.  Echo from 2015: "IMPRESSION: Left ventricular concentric hypertrophy.  Normal left ventricular systolic function.  Mild-to-moderate aortic valve regurgitation.  Severe tricuspid valve regurgitation. Slightly elevated estimated right ventricular systolic pressure."    Past Medical History:   Diagnosis Date    Aortic aneurysm     Atrial fibrillation     Chronic obstructive pulmonary disease     Coronary artery disease     Gastroesophageal reflux disease     Hypertension     Malignant neoplasm     Pacemaker     SSS (sick sinus syndrome)        Past Surgical History:   Procedure Laterality Date    CARDIAC PACEMAKER PLACEMENT      2002    HYSTERECTOMY         Family History   Problem Relation Age of Onset    Breast cancer Sister        Social  Social History     Tobacco Use    Smoking status: Never Smoker    Smokeless tobacco: Never Used   Substance Use Topics    Alcohol use: No    Drug use: No       .     Allergies   Allergen  Reactions    Adhesive [Wound Dressing Adhesive]     Codeine     Morphine     Nitroglycerin     Pain Patch [Menthol]        Home Medications             albuterol (PROVENTIL) (2.5 MG/3ML) 0.083% nebulizer solution     Take 2.5 mg by nebulization every 6 (six) hours as needed for Wheezing.     atenolol (TENORMIN) 50 MG tablet     Take 50 mg by mouth daily.         benzonatate (TESSALON) 200 MG capsule     Take 200 mg by mouth 3 (three) times daily as needed.     carbidopa-levodopa (SINEMET) 25-100 MG per tablet     Take 1 tablet by mouth 2 (two) times daily.     diltiazem (TIAZAC) 240 MG 24 hr capsule     Take 240 mg by mouth 2 (two) times daily.        fluorometholone (EFLONE) 0.1 % ophthalmic suspension     Place 1 drop into  the left eye 4 (four) times daily        furosemide (LASIX) 40 MG tablet     TAKE 1 TABLET BY MOUTH TWO TIMES DAILY ON AN EMPTY STOMACH     ketoconazole (NIZORAL) 2 % cream     Apply topically daily.     latanoprost (XALATAN) 0.005 % ophthalmic solution     Place 1 drop into both eyes nightly        rosuvastatin (CRESTOR) 10 MG tablet     Take 10 mg by mouth daily.     spironolactone (ALDACTONE) 50 MG tablet     Take 50 mg by mouth daily.     warfarin (COUMADIN) 3 MG tablet     Take 1 tablet (3 mg total) by mouth daily.     Patient taking differently: Take 2.5 mg by mouth daily 5mg  Wed and Thurs, and 2.5mg  the other days of the week.             Flagged for Removal             esomeprazole (NEXIUM) 40 MG capsule     TAKE 1 CAPSULE BY MOUTH EVERY DAY-STOP OMEPRAZOLE     estradiol (ESTRACE) 0.1 MG/GM vaginal cream     Place 2 g vaginally daily.           Review of Systems   Constitutional: Negative for chills and fever.   Respiratory: Positive for cough, shortness of breath and wheezing.    Cardiovascular: Negative for chest pain and palpitations.   Gastrointestinal: Negative for constipation, diarrhea, nausea and vomiting.   Genitourinary: Negative for dysuria, frequency and urgency.    Musculoskeletal: Negative for neck pain and neck stiffness.   Skin: Negative for rash.   Neurological: Negative for light-headedness and headaches.       Physical Exam    BP: 142/77, Heart Rate: 61, Resp Rate: (!) 24, SpO2: (!) 87 %, Weight: 87.7 kg    Physical Exam  Vitals signs and nursing note reviewed.   Constitutional:       General: She is in acute distress (increased work of breathing).      Appearance: She is well-developed. She is not ill-appearing or toxic-appearing.   HENT:      Head: Normocephalic and atraumatic.      Right Ear: External ear normal.      Left Ear: External ear normal.      Nose: Nose normal.      Mouth/Throat:      Mouth: Mucous membranes are moist.      Pharynx: Oropharynx is clear.   Eyes:      General: No scleral icterus.     Conjunctiva/sclera: Conjunctivae normal.   Neck:      Musculoskeletal: Neck supple.      Vascular: No JVD.   Cardiovascular:      Rate and Rhythm: Normal rate. Rhythm irregularly irregular.   Pulmonary:      Effort: Tachypnea, accessory muscle usage, prolonged expiration and respiratory distress (Able to speak in complete sentences with difficulty.) present.      Breath sounds: Decreased air movement present. Decreased breath sounds and wheezing (EEWs at bases only) present.      Comments: Poor air movement with EEWs at bases. Work of breathing and apparent comfort improved during her ER stay, but she is still obviously dyspneic.  Abdominal:      Palpations: Abdomen is soft.      Tenderness: There is no abdominal  tenderness.   Skin:     General: Skin is warm and dry.      Capillary Refill: Capillary refill takes less than 2 seconds.   Neurological:      General: No focal deficit present.      Mental Status: She is alert.       Results     Procedure Component Value Units Date/Time    Troponin I [[id number]] Collected: 01/24/19 1910    Specimen: Plasma Updated: 01/24/19 1938     Troponin I 0.02 ng/mL     Arterial Blood Gas [[id number]]  (Abnormal) Collected: 01/24/19  1830    Specimen: Arterial Updated: 01/24/19 1842     Allen's Test Positive     Draw Site - ABG Left Radial Artery     O2 Lit Per Min 2 L/min      VIA NC     pH, Arterial 7.41 pH      pCO2, Arterial 49.5 mm Hg      pO2, Arterial 84 mm Hg      O2 Sat, Arterial 94.8 %      HCO3, ABG 30.5 mMol/L      Base Excess, Arterial 5.4 mMol/L      Carboxyhemoglobin 0.5 %     B-type Natriuretic Peptide [[id number]]  (Abnormal) Collected: 01/24/19 1630    Specimen: Blood Updated: 01/24/19 1734     B-Natriuretic Peptide 388.7 pg/mL     Troponin I [[id number]]  (Abnormal) Collected: 01/24/19 1630    Specimen: Plasma Updated: 01/24/19 1729     Troponin I 0.04 ng/mL     Comprehensive metabolic panel [[id number]]  (Abnormal) Collected: 01/24/19 1630    Specimen: Plasma Updated: 01/24/19 1724     Sodium 140 mMol/L      Potassium 4.9 mMol/L      Chloride 100 mMol/L      CO2 31.0 mMol/L      Calcium 10.7 mg/dL      Glucose 89 mg/dL      Creatinine 2.44 mg/dL      BUN 51 mg/dL      Protein, Total 6.8 gm/dL      Albumin 4.1 gm/dL      Alkaline Phosphatase 131 U/L      ALT 7 U/L      AST (SGOT) 22 U/L      Bilirubin, Total 0.4 mg/dL      Albumin/Globulin Ratio 1.52 Ratio      Anion Gap 13.9 mMol/L      BUN / Creatinine Ratio 36.2 Ratio      EGFR 35 mL/min/1.37m2      Osmolality Calculated 293 mOsm/kg      Globulin 2.7 gm/dL     D-dimer, quantitative [[id number]]  (Abnormal) Collected: 01/24/19 1630    Specimen: Blood Updated: 01/24/19 1717     D-Dimer 0.79 mg/L FEU     Prothrombin time/INR [[id number]]  (Abnormal) Collected: 01/24/19 1630    Specimen: Blood Updated: 01/24/19 1717     PT 36.8 sec      PT INR 3.6    CBC and differential [[id number]]  (Abnormal) Collected: 01/24/19 1630    Specimen: Blood Updated: 01/24/19 1714     WBC 6.4 K/cmm      RBC 3.93 M/cmm      Hemoglobin 11.8 gm/dL      Hematocrit 43.3 %      MCV 99 fL      MCH 30 pg      MCHC 30 gm/dL  RDW 14.5 %      PLT CT 99 K/cmm      MPV 8.9 fL      Neutrophils % 76.2 %       Lymphocytes 13.1 %      Monocytes 9.1 %      Eosinophils % 1.1 %      Basophils % 0.6 %      Neutrophils Absolute 4.9 K/cmm      Lymphocytes Absolute 0.8 K/cmm      Monocytes Absolute 0.6 K/cmm      Eosinophils Absolute 0.1 K/cmm      Basophils Absolute 0.0 K/cmm         Radiology Results (24 Hour)     Procedure Component Value Units Date/Time    CT Angiogram Chest (PE) [[id number]] Collected: 01/24/19 2041    Order Status: Completed Updated: 01/24/19 2051    Narrative:      CTA CHEST    CLINICAL HISTORY: d-dimer.    COMPARISON:  Portable chest x-ray today, CT chest high-resolution 07/18/2015.    TECHNIQUE:   Helical CTA performed from thoracic inlet to upper abdomen after uneventful administration of IOHEXOL 350 MG/ML IV SOLN/140 mL.   2-D multiplanar and 3-D MIP images were obtained.  CT images acquired with automatic exposure control for dose reduction.  Per technologist note, "patient scanned and injected twice due to IV leak and motion. Patient moving arms and yelling at times due to discomfort."    FINDINGS:  Pulmonary arteries: No PE.  Thoracic Aorta: Ascending portion aneurysmal to 6 cm. Descending portion aneurysmal to 4.8 cm.  Heart/pericardium: Severe cardiomegaly. Small pericardial fluid.  Lungs/Pleura: Elevated left hemidiaphragm. Mild bibasilar atelectasis or scar. Otherwise clear, though severe motion.  Tracheobronchial tree: Patent.  Nodes: No enlarged nodes.   Bones: No acute bony abnormality.  Soft tissues: Unremarkable.  Visualized upper abdomen: Diverticula.        Impression:      1.  INSUFFICIENT OPACIFICATION TO EVALUATE FOR PE.  2.  SEVERE CARDIOMEGALY, WITH TRACE PERICARDIAL FLUID.  3.  ANEURYSMAL THORACIC AORTA PER ABOVE.    ReadingStation:[id number]    XR Chest AP Portable [[id number]] Collected: 01/24/19 1739    Order Status: Completed Updated: 01/24/19 1742    Narrative:      Clinical History:  dyspnea    Examination:  XR CHEST AP PORTABLE    Comparison:  04/25/2017    Technique:   Portable AP    Findings:  Left-sided 2-lead pacer remains in place.  Unchanged cardiomegaly.  Persistent elevation left hemidiaphragm with associated basilar atelectasis.  Lungs otherwise clear.  No acute bony abnormality.      Impression:      No acute cardiopulmonary findings given portable technique.    ReadingStation:[id number]        Last EKG Result     Procedure Component Value Units Date/Time    ECG 12 lead [[id number]] Collected: 01/24/19 1721     Updated: 01/24/19 1726     Patient Age 78 years      Patient DOB [birth year]     Patient Height --     Patient Weight --     Interpretation Text --     Atrial flutter with varied AV block,   Repol abnrm suggests ischemia, anterolateral  Compared to ECG 04/27/2017 04:35:12  Early repolarization now present  Atrial fibrillation no longer present  Ventricular-paced complex(es) or rhythm no longer present  Left ventricular hypertrophy no longer present  Possible ischemia still  present       Physician Interpreter --     Ventricular Rate 75 //min      QRS Duration 107 ms      P-R Interval -- ms      Q-T Interval 397 ms      Q-T Interval(Corrected) 444 ms      P Wave Axis -- deg      QRS Axis -11 deg      T Axis 264 years             MDM and ED Course     ED Medication Orders (From admission, onward)    Start Ordered     Status Ordering Provider    01/24/19 2020 01/24/19 2038  iohexol (OMNIPAQUE) 350 MG/ML injection 140 mL  IMG once as needed     Route: Intravenous  Ordered Dose: 140 mL     Last MAR action: Imaging Agent Given Zylee Marchiano DAVID    01/24/19 1824 01/24/19 1823  lactated ringers bolus 250 mL  Once in ED     Route: Intravenous  Ordered Dose: 250 mL     Last MAR action: Stopped Haruna Rohlfs DAVID    01/24/19 1710 01/24/19 1709  albuterol sulfate HFA (PROVENTIL) inhaler 2 puff  RT - Once     Route: Inhalation  Ordered Dose: 2 puff     Last MAR action: Given Lynnette Pote DAVID    01/24/19 1706 01/24/19 1705    Once in ED     Route: Inhalation  Ordered Dose: 2  puff     Discontinued Uriyah Raska DAVID    01/24/19 1706 01/24/19 1705  methylPREDNISolone sodium succinate (Solu-MEDROL) injection 80 mg  Once in ED     Route: Intravenous  Ordered Dose: 80 mg     Last MAR action: Given Deionte Spivack DAVID             MDM  Number of Diagnoses or Management Options  Diagnosis management comments: Although the CTA did not rule out a PE, her Wells score is zero and she is supratherapeutic on coumadin (permanent atrial fibrillation) with an INR of 3.6. I am comfortable that her acute symptoms are not the result of a significant PE.    I see no clinical stigmata of CHF although she has significant cardiomegaly on her CT and no recent echo to evaluate her EF. Her BNP is lower than previously recorded in Epic. She is on the dry side, and clinically tolerated LR 250 ml bolus under my very close monitoring. Imaging did not reveal interstitial edema and I don't see any radiographic concerns of acute CHF.     I spoke to Dr Audley Hose and expressed my feeling that her symptoms are COPD-related and do not seem to be due to a CHF exacerbation.    Risk of Complications, Morbidity, and/or Mortality  Presenting problems: moderate  Diagnostic procedures: moderate  Management options: moderate                   Wells Criteria For Pulmonary Embolism:    Score Risk Category:  0 - 2 Low Risk  2 - 6 Moderate Risk  >6 High Risk    Clinical Signs and Symptoms of DVT?: No (0)  PE Is #1 Diagnosis, or Equally Likely?: No (0)  Heart Rate > 100?: No (0)  Immobilization at least 3 days, or Surgery in the Previous 4 weeks?: No (0)   Previous, objectively diagnosed PE or DVT?:  No (0)  Hemoptysis?: No (0)  Malignancy w/ Treatment within 6 mo, or palliative?: No (0)     Score: 0    Procedures    Clinical Impression & Disposition     Clinical Impression  Final diagnoses:   COPD exacerbation        ED Disposition     ED Disposition Condition Date/Time Comment    Admit  Fri Jan 24, 2019  9:04 PM [name] Pro[place]cal to be  admitted.    Condition at disposition: Serious             New Prescriptions    No medications on file            ADDENDUM: right AC IV has apparently infiltrated as it is swollen, but non-tender and patient denies pain. FROM of elbow. X-ray confirms infiltration of IV contrast. Warm compresses ordered.     Elenor Quinones, MD  01/24/19 2126       Elenor Quinones, MD  01/24/19 2158

## 2019-01-24 NOTE — Respiratory Progress Note (Signed)
Called to assess patient. Pulse ox changed to the right finger. Cold hands observed. Lung sounds auscultated. Slightly diminished breath sound with crackles in the RLL. RN notified.

## 2019-01-24 NOTE — ED Notes (Signed)
3rd nurse in with U/S to assess AC's for IV, needed for CT angio.

## 2019-01-24 NOTE — ED Notes (Signed)
The patient's informed consent to perform this consultation using telehealth tools was obtained: Yes    Telehealth patient education given prior to telehealth session:Yes    Start Time: 2120  End Time: 2122  Staff present during the patient's telehealth session: Rosann Auerbach, RN.

## 2019-01-24 NOTE — ED Notes (Signed)
Daughter, [name], notified of admission.

## 2019-01-24 NOTE — ED Notes (Signed)
Pt assisted to and from [facility] without difficulty, pt c/o being increased SOB after being up, SATs 97% on 4L, pt became more comfortable after a few mins of rest. Side rails up X2, call bell within reach, pt denies any needs or concerns at this time.

## 2019-01-24 NOTE — ED Notes (Signed)
Pt on portable phone with daughter to give her update.

## 2019-01-24 NOTE — ED Notes (Signed)
Patient access aware of admission, pt alert/oriented, able to answer questions regarding care partners, [facility] nurse aware of admit, bed assigned 203.

## 2019-01-24 NOTE — ED Notes (Signed)
Dr Nedra Hai consulting with Dr Audley Hose for admission.

## 2019-01-24 NOTE — ED Notes (Signed)
Pt's daughter called ED for update. Notified that mother was in CT scan and results would take an hour. She replied that she was told that over an hour ago. Advised that her mother is not an easy IV stick and she is back having it done now, we will call when results are back with plan of either admission or D/C home.

## 2019-01-24 NOTE — ED Notes (Signed)
Bed: ED6-A  Expected date:   Expected time:   Means of arrival:   Comments:  EMS

## 2019-01-24 NOTE — Progress Notes (Signed)
Pharmacy Warfarin per Protocol Note   [name] [facility]    Recent Labs   Lab 01/24/19  1630   PT INR 3.6*       Warfarin level 1 protocol, hold warfarin today due to INR = 3.6.    If you have any questions, please contact the pharmacist .    Eliot Ford, [facility]

## 2019-01-25 DIAGNOSIS — I5023 Acute on chronic systolic (congestive) heart failure: Secondary | ICD-10-CM

## 2019-01-25 LAB — CBC
Hematocrit: 36.5 % (ref 36.0–48.0)
Hemoglobin: 11 gm/dL — ABNORMAL LOW (ref 12.0–16.0)
MCH: 30 pg (ref 28–35)
MCHC: 30 gm/dL — ABNORMAL LOW (ref 31–36)
MCV: 98 fL (ref 80–100)
MPV: 8.7 fL (ref 6.0–10.0)
PLT CT: 94 10*3/uL — ABNORMAL LOW (ref 130–440)
RBC: 3.71 10*6/uL — ABNORMAL LOW (ref 3.80–5.00)
RDW: 14.2 % (ref 10.5–14.5)
WBC: 5.2 10*3/uL (ref 4.0–11.0)

## 2019-01-25 LAB — PT/INR
PT INR: 3.8 — ABNORMAL HIGH (ref 0.9–1.2)
PT: 38.4 s — ABNORMAL HIGH (ref 9.4–11.3)

## 2019-01-25 LAB — CREATININE, SERUM
Creatinine: 1.21 mg/dL — ABNORMAL HIGH (ref 0.60–1.20)
EGFR: 42 mL/min/{1.73_m2} — ABNORMAL LOW (ref 60–150)

## 2019-01-25 MED ORDER — FUROSEMIDE 10 MG/ML IJ SOLN
40.00 mg | Freq: Four times a day (QID) | INTRAMUSCULAR | Status: AC
Start: 2019-01-25 — End: 2019-01-26
  Administered 2019-01-25 – 2019-01-26 (×3): 40 mg via INTRAVENOUS
  Filled 2019-01-25 (×3): qty 4

## 2019-01-25 MED ORDER — ALBUTEROL SULFATE (2.5 MG/3ML) 0.083% IN NEBU
2.50 mg | INHALATION_SOLUTION | RESPIRATORY_TRACT | Status: DC | PRN
Start: 2019-01-25 — End: 2019-01-30
  Administered 2019-01-26 – 2019-01-29 (×7): 2.5 mg via RESPIRATORY_TRACT
  Filled 2019-01-25 (×7): qty 3

## 2019-01-25 MED ORDER — BENZONATATE 100 MG PO CAPS
100.00 mg | ORAL_CAPSULE | Freq: Three times a day (TID) | ORAL | Status: DC | PRN
Start: 2019-01-25 — End: 2019-01-30
  Administered 2019-01-27 – 2019-01-29 (×3): 100 mg via ORAL
  Filled 2019-01-25 (×3): qty 1

## 2019-01-25 NOTE — Progress Notes (Signed)
[name] is a lovely 78 year old who I see from time to time at the hospital.  She is followed by Dr. Elmer Ramp in [place] on a regular basis.  She has been having shortness of breath that is getting insidiously worse.  It is associated with inability to take a deep breath and occasional dry cough.  She has worsening exercise capacity throughout her home despite wearing supplemental oxygen.  She has a history of cardiomyopathy and on admission she has a markedly enlarged heart.  Her EKG shows an atrial fibrillation mechanism with LVH and secondary repolarization changes.  There is also a history of COPD.  The question on admission when she arrived last evening was whether the shortness of breath was due to her heart or lungs.  A decision was made based on the evidence that the patient had pulmonary shortness of breath and the admitting physician from [facility] put in orders with that in mind.  I think the patient is having cardiac shortness of breath however.  She has neck vein distention, facial plethora, cardiac gallop.  There is not much lower extremity edema.  I looked at the CT scan.  I think there is some interstitial edema seen in the bases of the lungs.  This is a subtle call but I am thinking this woman has cardiac pulmonary edema.  I saw her in December 2018 and she responded to diuresis.  The only echo on record is from 2015 where she has left ventricular hypertrophy with abnormal diastolic compliance and mild to moderate aortic regurgitation.  Her heart is massive in size.    Vitals:    01/25/19 0948   BP:    Pulse: 65   Resp: 18   Temp:    SpO2: 96%   See the above for the physical exam    Results     Procedure Component Value Units Date/Time    CBC without differential [[id number]]  (Abnormal) Collected: 01/25/19 0620    Specimen: Blood Updated: 01/25/19 0746     WBC 5.2 K/cmm      RBC 3.71 M/cmm      Hemoglobin 11.0 gm/dL      Hematocrit 40.9 %      MCV 98 fL      MCH 30 pg      MCHC  30 gm/dL      RDW 81.1 %      PLT CT 94 K/cmm      MPV 8.7 fL     Creatinine, serum [[id number]]  (Abnormal) Collected: 01/25/19 0620    Specimen: Plasma Updated: 01/25/19 0710     Creatinine 1.21 mg/dL      EGFR 42 OZ/HYQ/6.57Q4     Prothrombin time/INR [[id number]]  (Abnormal) Collected: 01/25/19 0620    Specimen: Blood Updated: 01/25/19 0704     PT 38.4 sec      PT INR 3.8    Lactic Acid [[id number]] Collected: 01/24/19 2130    Specimen: Blood Updated: 01/24/19 2150     Lactic Acid 1.3 mMol/L     Urinalysis w Microscopic and Culture if Indicated [[id number]] Collected: 01/24/19 1725    Specimen: Urine, Random Updated: 01/24/19 2123     Color, UA Yellow     Clarity, UA Clear     Urine Specific Gravity 1.010     pH, Urine 5.0 pH      Protein, UR Negative mg/dL      Glucose, UA Negative mg/dL      Ketones UA  Negative mg/dL      Bilirubin, UA Negative mg/dL      Blood, UA Negative mg/dL      Nitrite, UA Negative     Urobilinogen, UA 0.2 mg/dL      Leukocyte Esterase, UA Negative Leu/uL     Troponin I [[id number]] Collected: 01/24/19 1910    Specimen: Plasma Updated: 01/24/19 1938     Troponin I 0.02 ng/mL     Arterial Blood Gas [[id number]]  (Abnormal) Collected: 01/24/19 1830    Specimen: Arterial Updated: 01/24/19 1842     Allen's Test Positive     Draw Site - ABG Left Radial Artery     O2 Lit Per Min 2 L/min      VIA NC     pH, Arterial 7.41 pH      pCO2, Arterial 49.5 mm Hg      pO2, Arterial 84 mm Hg      O2 Sat, Arterial 94.8 %      HCO3, ABG 30.5 mMol/L      Base Excess, Arterial 5.4 mMol/L      Carboxyhemoglobin 0.5 %     B-type Natriuretic Peptide [[id number]]  (Abnormal) Collected: 01/24/19 1630    Specimen: Blood Updated: 01/24/19 1734     B-Natriuretic Peptide 388.7 pg/mL     Troponin I [[id number]]  (Abnormal) Collected: 01/24/19 1630    Specimen: Plasma Updated: 01/24/19 1729     Troponin I 0.04 ng/mL     Comprehensive metabolic panel [[id number]]  (Abnormal) Collected: 01/24/19 1630    Specimen: Plasma Updated:  01/24/19 1724     Sodium 140 mMol/L      Potassium 4.9 mMol/L      Chloride 100 mMol/L      CO2 31.0 mMol/L      Calcium 10.7 mg/dL      Glucose 89 mg/dL      Creatinine 5.36 mg/dL      BUN 51 mg/dL      Protein, Total 6.8 gm/dL      Albumin 4.1 gm/dL      Alkaline Phosphatase 131 U/L      ALT 7 U/L      AST (SGOT) 22 U/L      Bilirubin, Total 0.4 mg/dL      Albumin/Globulin Ratio 1.52 Ratio      Anion Gap 13.9 mMol/L      BUN / Creatinine Ratio 36.2 Ratio      EGFR 35 mL/min/1.56m2      Osmolality Calculated 293 mOsm/kg      Globulin 2.7 gm/dL     D-dimer, quantitative [[id number]]  (Abnormal) Collected: 01/24/19 1630    Specimen: Blood Updated: 01/24/19 1717     D-Dimer 0.79 mg/L FEU     Prothrombin time/INR [[id number]]  (Abnormal) Collected: 01/24/19 1630    Specimen: Blood Updated: 01/24/19 1717     PT 36.8 sec      PT INR 3.6    CBC and differential [[id number]]  (Abnormal) Collected: 01/24/19 1630    Specimen: Blood Updated: 01/24/19 1714     WBC 6.4 K/cmm      RBC 3.93 M/cmm      Hemoglobin 11.8 gm/dL      Hematocrit 41.6 %      MCV 99 fL      MCH 30 pg      MCHC 30 gm/dL      RDW 60.6 %      PLT CT 99 K/cmm  MPV 8.9 fL      Neutrophils % 76.2 %      Lymphocytes 13.1 %      Monocytes 9.1 %      Eosinophils % 1.1 %      Basophils % 0.6 %      Neutrophils Absolute 4.9 K/cmm      Lymphocytes Absolute 0.8 K/cmm      Monocytes Absolute 0.6 K/cmm      Eosinophils Absolute 0.1 K/cmm      Basophils Absolute 0.0 K/cmm         Active Hospital Problems    Diagnosis   . COPD exacerbation   . Chronic respiratory failure with hypoxia, on home O2 therapy   . Thoracic aortic aneurysm without rupture   . Acute exacerbation of CHF (congestive heart failure)   . Permanent atrial fibrillation   . Acute on chronic congestive heart failure   Plan: I am going to give her IV Lasix.  We will insert a Foley catheter.  I talked to the patient today and she tells me she does not want any heroics to keep her alive.  I see that she is  full code.  I will clear that up with her before I change it.    Electronically signed by Graciela Husbands D.O.

## 2019-01-26 LAB — BASIC METABOLIC PANEL
Anion Gap: 13.1 mMol/L (ref 7.0–18.0)
BUN / Creatinine Ratio: 46.4 Ratio — ABNORMAL HIGH (ref 10.0–30.0)
BUN: 52 mg/dL — ABNORMAL HIGH (ref 7–22)
CO2: 34 mMol/L — ABNORMAL HIGH (ref 20.0–30.0)
Calcium: 10.9 mg/dL — ABNORMAL HIGH (ref 8.5–10.5)
Chloride: 90 mMol/L — ABNORMAL LOW (ref 98–110)
Creatinine: 1.12 mg/dL (ref 0.60–1.20)
EGFR: 47 mL/min/{1.73_m2} — ABNORMAL LOW (ref 60–150)
Glucose: 119 mg/dL — ABNORMAL HIGH (ref 71–99)
Osmolality Calculated: 282 mOsm/kg (ref 275–300)
Potassium: 4.1 mMol/L (ref 3.5–5.3)
Sodium: 133 mMol/L — ABNORMAL LOW (ref 136–147)

## 2019-01-26 LAB — ECG 12-LEAD
Patient Age: 78 years
Q-T Interval(Corrected): 444 ms
Q-T Interval: 397 ms
QRS Axis: -11 deg
QRS Duration: 107 ms
T Axis: 264 years
Ventricular Rate: 75 //min

## 2019-01-26 LAB — PT/INR
PT INR: 3 — ABNORMAL HIGH (ref 0.9–1.2)
PT: 30.7 s — ABNORMAL HIGH (ref 9.4–11.3)

## 2019-01-26 MED ORDER — FUROSEMIDE 10 MG/ML IJ SOLN
40.00 mg | Freq: Every day | INTRAMUSCULAR | Status: DC
Start: 2019-01-26 — End: 2019-01-27
  Administered 2019-01-26 – 2019-01-27 (×2): 40 mg via INTRAVENOUS
  Filled 2019-01-26 (×2): qty 4

## 2019-01-26 NOTE — Respiratory Progress Note (Addendum)
Found patient off oxygen, SaO2 88-90%. Some increased work of breathing noted. Oxygen replaced, breathing treatment given, SaO2 after oxygen at 2l replaced was 95%.  Marland Kitchen

## 2019-01-26 NOTE — Progress Notes (Signed)
[name] looks a lot better than yesterday.  Overnight she had 4 L of urine output with market improvement today.  She denies chest pain and shortness of breath.  There is no orthopnea.  She is speaking in longer sentences that she can take a deeper breath.    Vitals:    01/26/19 0810   BP:    Pulse: 73   Resp: 16   Temp:    SpO2: (!) 89%   Lungs with clear breath sounds bilaterally.  Cardiac rhythm irregular S1-S2 no murmurs  Abdomen soft nontender  No peripheral edema    Results     Procedure Component Value Units Date/Time    Basic Metabolic Panel [[id number]]  (Abnormal) Collected: 01/26/19 0537    Specimen: Plasma Updated: 01/26/19 1001     Sodium 133 mMol/L      Potassium 4.1 mMol/L      Chloride 90 mMol/L      CO2 34.0 mMol/L      Calcium 10.9 mg/dL      Glucose 409 mg/dL      Creatinine 8.11 mg/dL      BUN 52 mg/dL      Anion Gap 91.4 mMol/L      BUN / Creatinine Ratio 46.4 Ratio      EGFR 47 mL/min/1.60m2      Osmolality Calculated 282 mOsm/kg     Prothrombin time/INR [[id number]]  (Abnormal) Collected: 01/26/19 0537    Specimen: Blood Updated: 01/26/19 0616     PT 30.7 sec      PT INR 3.0        Active Hospital Problems    Diagnosis   . Chronic respiratory failure with hypoxia, on home O2 therapy   . Thoracic aortic aneurysm without rupture   . Acute exacerbation of CHF (congestive heart failure)   . Permanent atrial fibrillation   . Acute on chronic congestive heart failure   Plan: This woman has never smoked cigarettes and it is unlikely that she has COPD.  I think her shortness of breath is caused by congestive heart failure.  She has a massively enlarged heart and likely has a cardiomyopathy.  She has a history of left ventricular concentric hypertrophy at one time and now is in atrial fibrillation.  The findings are consistent with CHF.  Her overnight response to diuretics is similar to 2018 when she was here.  I told her today that if she goes to an emergency room and there is debate as to whether or  not her heart and lungs were causing her shortness of breath, that she is to tell them that it is her heart.    Electronically signed by Graciela Husbands D.O.

## 2019-01-27 DIAGNOSIS — R110 Nausea: Secondary | ICD-10-CM | POA: Diagnosis not present

## 2019-01-27 DIAGNOSIS — K5903 Drug induced constipation: Secondary | ICD-10-CM | POA: Diagnosis not present

## 2019-01-27 DIAGNOSIS — R519 Headache, unspecified: Secondary | ICD-10-CM | POA: Diagnosis not present

## 2019-01-27 LAB — BASIC METABOLIC PANEL
Anion Gap: 13.8 mMol/L (ref 7.0–18.0)
BUN / Creatinine Ratio: 49.1 Ratio — ABNORMAL HIGH (ref 10.0–30.0)
BUN: 52 mg/dL — ABNORMAL HIGH (ref 7–22)
CO2: 35 mMol/L — ABNORMAL HIGH (ref 20.0–30.0)
Calcium: 10.5 mg/dL (ref 8.5–10.5)
Chloride: 90 mMol/L — ABNORMAL LOW (ref 98–110)
Creatinine: 1.06 mg/dL (ref 0.60–1.20)
EGFR: 50 mL/min/{1.73_m2} — ABNORMAL LOW (ref 60–150)
Glucose: 89 mg/dL (ref 71–99)
Osmolality Calculated: 284 mOsm/kg (ref 275–300)
Potassium: 3.8 mMol/L (ref 3.5–5.3)
Sodium: 135 mMol/L — ABNORMAL LOW (ref 136–147)

## 2019-01-27 LAB — PT/INR
PT INR: 2 — ABNORMAL HIGH (ref 0.9–1.2)
PT: 21.1 s — ABNORMAL HIGH (ref 9.4–11.3)

## 2019-01-27 MED ORDER — CARBIDOPA-LEVODOPA 25-100 MG PO TABS
1.0000 | ORAL_TABLET | Freq: Every day | ORAL | Status: DC
Start: 2019-01-28 — End: 2019-01-28
  Administered 2019-01-28: 08:00:00 1 via ORAL
  Filled 2019-01-27: qty 1

## 2019-01-27 MED ORDER — ONDANSETRON 4 MG PO TBDP
4.00 mg | ORAL_TABLET | Freq: Three times a day (TID) | ORAL | Status: DC | PRN
Start: 2019-01-27 — End: 2019-01-30
  Administered 2019-01-27: 10:00:00 4 mg via ORAL
  Filled 2019-01-27: qty 1

## 2019-01-27 MED ORDER — ACETAMINOPHEN 325 MG PO TABS
650.0000 mg | ORAL_TABLET | Freq: Four times a day (QID) | ORAL | Status: DC | PRN
Start: 2019-01-27 — End: 2019-01-30
  Administered 2019-01-27: 650 mg via ORAL
  Filled 2019-01-27: qty 2

## 2019-01-27 MED ORDER — BISACODYL 10 MG RE SUPP
10.00 mg | Freq: Once | RECTAL | Status: AC
Start: 2019-01-27 — End: 2019-01-27
  Administered 2019-01-27: 10:00:00 10 mg via RECTAL
  Filled 2019-01-27: qty 1

## 2019-01-27 MED ORDER — KETOROLAC TROMETHAMINE 30 MG/ML IJ SOLN
15.00 mg | Freq: Once | INTRAMUSCULAR | Status: AC
Start: 2019-01-27 — End: 2019-01-27
  Administered 2019-01-27: 10:00:00 15 mg via INTRAVENOUS
  Filled 2019-01-27: qty 1

## 2019-01-27 NOTE — Progress Notes (Signed)
78 yo female who admits to observation status with diagnosis of CHF. Upon entrance into room, patient sitting up in chair, awaiting breakfast. Patient states "I don't feels so good this morning" and voices complaint of headache. Primary nurse, Daisy, notified of patient's complaint and will follow-up. During our visit, patient noted to be alert and oriented x 4. She states that her daughter and son-in-law live with her in a single level home with a basement. Home has a ramped entrance. Patient voices being able to perform own bath, dress self and toilet. She states that her daughter will assist her as needed. Patient states the her daughter does most of the housecleaning, laundry, shopping and cooking. Transportation is provided by daughter, son-in-law or neighbors. She voices one of them to be available at time of discharge to transport her home. Patient states that she is usually ambulatory without an assistive device but states she does take a cane with her when she goes out in the community. She voices having 2 FWW, shower chair(not in use), grab bars, oxygen, portable oxygen and nebulizer. She voices getting her oxygen supplies through [place] with no issues. Home health discussed and patient is receptive but still wants to think about the service and which agency to use. Patient verifies that she has MCR/FEP BCBS. Patient states that she has RX coverage and uses [facility] for RX needs. Dr. Fransisca Kaufmann was verified as her PCP and she stated that she saw him ~ 2 weeks ago. Will follow back up with patient regarding Home Health needs. No further d/c needs at this time but CM will continue to assess needs and intervene as indicated.    01/27/19 0805   Patient Type   Within 30 Days of Previous Admission? No   Healthcare Decisions   Interviewed: Patient   Orientation/Decision Making Abilities of Patient Alert and Oriented x3, able to make decisions   Level of Function 2 Weeks Prior to Admission   Prior level of function Needs  assistance with ADLs;Ambulates with assistive device   Level of Function Upon Admission   Current Level of Function Ambulates with assistive device;Needs assistance with ADLs   Prior to admission   Type of Residence Private residence   Home Layout One level;Ramped entrance  (has a basement but patient does not use)   Have running water, electricity, heat, etc? Yes   Living Arrangements Children   How do you get to your MD appointments? family/neighbors   How do you get your groceries? daughter   Who fixes your meals? daughter/self   Who does your laundry? daughter   Who picks up your prescriptions? family   DME Currently at Home ADL- Grab Bars;Cane, Single Point;Walker, Front Wheel;Portable O2 Tank;Home O2;Nebulizer;ADL- Shower Chair   Adult [employer] (APS) involved? No   Discharge Planning   Support Systems Children;Family members;Friends/neighbors   Patient expects to be discharged to: home   Anticipated [place] plan discussed with: Same as interviewed   Mode of transportation: Private car (family member)   Does the patient have perscription coverage? Yes   Consults/Providers   Correct PCP listed in Epic? Yes   Family and PCP   PCP on file was verified as the current PCP? Yes   Important Message from [facility] Notice   Patient received 1st IMM Letter? n/a

## 2019-01-27 NOTE — Progress Notes (Signed)
[name] today.  She was admitted over the weekend with difficulty breathing caused by an exacerbation of chronic heart failure.  She has been getting IV Lasix and had a dose of this this morning.  Last evening she started to feel nauseated and that is continued this morning.  She was able to eat a little bit of breakfast.  She has had no vomiting.  With this she also has a headache some constipation and overall disquieting feeling.  She was given a nebulizer treatment by RT this morning.  Although she does not have COPD technically, I do think she has asthma.  She does have a little bit of expiratory wheeze today but she has not air hungry as she was on admission.  She has had a good diuresis and I think we can hold the Lasix safely today as she is not going to have much intake.  Labs this morning show elevation of BUN but it seems to be her baseline.  Today it is 52 mg/dL with a creatinine of 6.04 mg/dL.  She has a mild metabolic alkalosis from the loop blocking diuretics and perhaps a component of mild CO2 retention.  On exam she looks a bit anxious and chronically ill.  She is a bit frustrated as yesterday she felt good.    JVD is absent  Skin is warm and dry  Cardiac rhythm is irregular S1-S2 heard.  Rate is controlled  Lungs with clear inspiration but mild expiratory wheeze.  There is no air hunger.  Oxygen saturation is normal.  Extremities are free of edema.  The abdomen is soft.  Is slightly distended.  Bowel sounds are active and normal.  She did have a small bowel movement this morning.  She is struggling with stool.  She has an indwelling Foley catheter.    Active Hospital Problems    Diagnosis   . Acute on chronic diastolic (congestive) heart failure   . Nausea   . Headache   . Constipation   . Chronic respiratory failure with hypoxia, on home O2 therapy   . Thoracic aortic aneurysm without rupture   . Acute exacerbation of CHF (congestive heart failure)   . Permanent atrial fibrillation   . Acute on  chronic congestive heart failure   Plan I am going to initiate some symptomatic treatment.  We will give her Zofran for the nausea and Toradol for the headache.  I will give her Dulcolax suppository for the constipation.  I will hold her Lasix since her intake will be poor today.  We will update labs again in the morning.  I am moving her to inpatient management as she has been observation over the weekend.    Electronically signed by Graciela Husbands D.O.

## 2019-01-27 NOTE — UM Notes (Signed)
[facility] Utilization Management Review Sheet    Facility :  [facility]    NAME: [name]  MR#: [id number]    CSN#: [id number]    ROOM: 203/203-A AGE: 78 y.o.    ADMIT DATE AND TIME: 01/24/2019  4:19 PM      PATIENT CLASS: Observation<01/24/2019 9:17 PM>. Changed to IP <>01/27/2019 9:44 AM    ATTENDING PHYSICIAN: Graciela Husbands, DO  PAYOR:Payor: MEDICARE / Plan: MEDICARE PART A AND B / Product Type: Medicare /       AUTH #:     DIAGNOSIS:     ICD-10-CM    1. COPD exacerbation  J44.1        HISTORY:   Past Medical History:   Diagnosis Date    Aortic aneurysm     Atrial fibrillation     Chronic obstructive pulmonary disease     Coronary artery disease     Gastroesophageal reflux disease     Hypertension     Malignant neoplasm     Pacemaker     SSS (sick sinus syndrome)        DATE OF REVIEW: 01/27/2019    VITALS: BP (!) 136/92    Pulse 97    Temp 97.3 F (36.3 C) (Oral)    Resp 18    Ht 1.651 m (5\' 5" )    Wt 77.1 kg (169 lb 14.4 oz)    SpO2 98%    BMI 28.27 kg/m     Active Hospital Problems    Diagnosis    Chronic respiratory failure with hypoxia, on home O2 therapy    Thoracic aortic aneurysm without rupture    Acute exacerbation of CHF (congestive heart failure)    Permanent atrial fibrillation    Acute on chronic congestive heart failure     HPI  HPI   This very pleasant patient with an extremely complicated medical history presents with 2-3 days of worsening shortness of breath. Her breathing has been worse than her normal for a couple months, but she has been tolerating it. Last night her breathing was bad enough that it frightened her. She hates coming to the hospital but today she decided that she doesn't want to have another night like last night so she presented to the ER. Denies chest pain.    Interestingly, she reports to me that while her symptoms improve when she uses her inhaler, over the last couple of months her arthritis has prevented her from squeezing the MDI. Also,  albuterol makes her jittery and she doesn't like the way this makes her feel. She is on O2 at home; baseline is 2L but she is allowed to self-titrate to as high as 5L based on symptoms.     She has a h/o HFpEF although it has been >5 years since her last echo.  Echo from 2015: "IMPRESSION: Left ventricular concentric hypertrophy. Normal left ventricular systolic function. Mild-to-moderate aortic valve regurgitation. Severe tricuspid valve regurgitation. Slightly elevated estimated right ventricular systolic pressure."    Labs<> Ca 10.7. Co2 31.0.  Bun 51. Crt 1.41. D-dimer  0.79.     Abg<>PH 7.41. Pc02 49.5. O2 Sat 94.8% on 2 L per NC.    XR Humerus <>  Impression:      Marked radiopaque contrast infiltrate of the right arm soft tissues from forearm/elbow to the upper humeral/axillary region.     Diffuse osteopenia. No acute fracture or dislocation, though bones partly obscured by contrast material.  CT angiogram chest<>    Impression:    1. INSUFFICIENT OPACIFICATION TO EVALUATE FOR PE.   2. SEVERE CARDIOMEGALY, WITH TRACE PERICARDIAL FLUID.   3. ANEURYSMAL THORACIC AORTA PER ABOVE            ED Tx<> ML. Oxygen. Solumedrol 80mg  IV. Albuterol 2 puff. IV LR .         H&P<> Dr Audley Hose    Patient has been compliant with her medication. In the ED. Patient required 4lpm o2 which was higher than her normal 2lpm at home. CT of chest showed stable aortic aneurysm but significant cardiomegaly. Her INR was 3.6 and troponin trended down from 0.04->0.02. Dr. Nedra Hai in the ED thought that patient was in copd exacerbation and she looked euvolemic when I asked him since I have lack of physical examination thru tele medicine. Patient would be admitted under obs for COPD exacerbation.    Acute on chronic hypoxic respiratory failure likely due to COPD exacerbation. Does not use inhaler at home. 2lpm o2 at home. Has cardiomegaly on CT chest. Difficult to assess volume status thru telemedicine but Dr. Nedra Hai stated to me  that she did not look volume overload but rather intravascular depletion.  -Solu medrol IV 40mg  daily.  -Duoneb.  -not on home ICS-LABA. Will start breo.    AKI. Likely due to prerenal azotemia rather than cardio-renal. UA unremarkable. Patient received IV contrast loading for CTPE in the ED. Further precipitated by lasix and aldactone.  -watch for contract induced nephropathy.  -will start LR at 75. If no improvement in kidney function. Might need to re-assess volume status.    Afib. Low HR with low BP.  Holding Atenolol and Diltiazem.  Warfarin per pharmacy.    Heart failure. Difficult to do volume status. However, ED provider assessment does not seem to correlate with decompensation.  -Holding Lasix    Dyslipidemia.  -Crestor/.    Hypertension.  -Holding spironolactone in the light of AKI.    Parkinson disease.  -Sinemet.    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 Stable in size.  -Outpatient surveillance monitoring.        Observation <>    PT/inr. Cbc. Bmp. I&O. Daily weight. Foley cath. Tylenol 650mg  po Q6H prn. Duoneb Q6H. Solumedrol 40mg  IV daily.       CSR<> 01/25/2019<>Dr Amada Jupiter    She has neck vein distention, facial plethora, cardiac gallop.  There is not much lower extremity edema.  I looked at the CT scan.  I think there is some interstitial edema seen in the bases of the lungs.  This is a subtle call but I am thinking this woman has cardiac pulmonary edema.  I saw her in December 2018 and she responded to diuresis.  The only echo on record is from 2015 where she has left ventricular hypertrophy with abnormal diastolic compliance and mild to moderate aortic regurgitation.  Her heart is massive in size.  Plan: I am going to give her IV Lasix.  We will insert a Foley catheter.  I talked to the patient today and she tells me she does not want any heroics to keep her alive.  I see that she is full code.  I will clear that up with her before I change it    Meds<> Lasix  40mg  IV Q6H.         CSR<> 01/26/2019<> Dr Amada Jupiter    Nurse note<> Found patient off oxygen, SaO2 88-90%. Some increased work of  breathing noted. Oxygen replaced, breathing treatment given, SaO2 after oxygen at 2l replaced was 95%    Dr Dale<>  Overnight she had 4 L of urine output with market improvement today.  She denies chest pain and shortness of breath.  There is no orthopnea.  She is speaking in longer sentences that she can take a deeper breath.    Vitals       Vitals:    01/26/19 0810   BP:    Pulse: 73   Resp: 16   Temp:    SpO2: (!) 89%      Lungs with clear breath sounds bilaterally.  Cardiac rhythm irregular S1-S2 no murmurs  Abdomen soft nontender  No peripheral edema  Plan: This woman has never smoked cigarettes and it is unlikely that she has COPD.  I think her shortness of breath is caused by congestive heart failure.  She has a massively enlarged heart and likely has a cardiomyopathy.  She has a history of left ventricular concentric hypertrophy at one time and now is in atrial fibrillation.  The findings are consistent with CHF.  Her overnight response to diuretics is similar to 2018 when she was here.  I told her today that if she goes to an emergency room and there is debate as to whether or not her heart and lungs were causing her shortness of breath, that she is to tell them that it is her heart.    Electronically signed by Graciela Husbands D.O.      CSR<> 01/27/2019<>Dr. Amada Jupiter    She has been getting IV Lasix and had a dose of this this morning.  Last evening she started to feel nauseated and that is continued this morning.  She was able to eat a little bit of breakfast.  She has had no vomiting.  With this she also has a headache some constipation and overall disquieting feeling.  She was given a nebulizer treatment by RT this morning.  Although she does not have COPD technically, I do think she has asthma.  She does have a little bit of expiratory wheeze today but she has not air hungry as she  was on admission.  She has had a good diuresis and I think we can hold the Lasix safely today as she is not going to have much intake.  Labs this morning show elevation of BUN but it seems to be her baseline.  Today it is 52 mg/dL with a creatinine of 1.61 mg/dL.  She has a mild metabolic alkalosis from the loop blocking diuretics and perhaps a component of mild CO2 retention.  Plan I am going to initiate some symptomatic treatment.  We will give her Zofran for the nausea and Toradol for the headache.  I will give her Dulcolax suppository for the constipation.  I will hold her Lasix since her intake will be poor today.  We will update labs again in the morning.  I am moving her to inpatient management as she has been observation over the weekend.  Electronically signed by Graciela Husbands D.O.    Changed to IP status per Dr. Dale<>01/27/2019 9:44 AM      Meets IP<>MCG [id number]    Lowella Dell RN,CCM/UR    [facility]  [address]  [place], [place] [address]    [facility]: [phone]  FX: [phone]

## 2019-01-27 NOTE — Progress Notes (Signed)
By room to speak with patient further about Home Health. Patient currently resting with eyes closed. Will f/u prior to discharge.

## 2019-01-27 NOTE — Plan of Care (Signed)
Problem: Moderate/High Fall Risk Score >5  Goal: Patient will remain free of falls  Outcome: Progressing

## 2019-01-28 ENCOUNTER — Inpatient Hospital Stay: Payer: Medicare Other

## 2019-01-28 DIAGNOSIS — J45901 Unspecified asthma with (acute) exacerbation: Secondary | ICD-10-CM | POA: Clinically undetermined

## 2019-01-28 LAB — BASIC METABOLIC PANEL
Anion Gap: 11.2 mMol/L (ref 7.0–18.0)
BUN / Creatinine Ratio: 46.5 Ratio — ABNORMAL HIGH (ref 10.0–30.0)
BUN: 59 mg/dL — ABNORMAL HIGH (ref 7–22)
CO2: 35 mMol/L — ABNORMAL HIGH (ref 20.0–30.0)
Calcium: 9.9 mg/dL (ref 8.5–10.5)
Chloride: 91 mMol/L — ABNORMAL LOW (ref 98–110)
Creatinine: 1.27 mg/dL — ABNORMAL HIGH (ref 0.60–1.20)
EGFR: 40 mL/min/{1.73_m2} — ABNORMAL LOW (ref 60–150)
Glucose: 86 mg/dL (ref 71–99)
Osmolality Calculated: 282 mOsm/kg (ref 275–300)
Potassium: 4.2 mMol/L (ref 3.5–5.3)
Sodium: 133 mMol/L — ABNORMAL LOW (ref 136–147)

## 2019-01-28 LAB — CBC AND DIFFERENTIAL
Basophils %: 0.9 % (ref 0.0–3.0)
Basophils Absolute: 0.1 10*3/uL (ref 0.0–0.3)
Eosinophils %: 0.3 % (ref 0.0–7.0)
Eosinophils Absolute: 0 10*3/uL (ref 0.0–0.8)
Hematocrit: 38.9 % (ref 36.0–48.0)
Hemoglobin: 11.9 gm/dL — ABNORMAL LOW (ref 12.0–16.0)
Lymphocytes Absolute: 0.8 10*3/uL (ref 0.6–5.1)
Lymphocytes: 12.9 % — ABNORMAL LOW (ref 15.0–46.0)
MCH: 30 pg (ref 28–35)
MCHC: 31 gm/dL (ref 31–36)
MCV: 97 fL (ref 80–100)
MPV: 8 fL (ref 6.0–10.0)
Monocytes Absolute: 0.7 10*3/uL (ref 0.1–1.7)
Monocytes: 11 % (ref 3.0–15.0)
Neutrophils %: 74.8 % (ref 42.0–78.0)
Neutrophils Absolute: 4.8 10*3/uL (ref 1.7–8.6)
PLT CT: 118 10*3/uL — ABNORMAL LOW (ref 130–440)
RBC: 3.99 10*6/uL (ref 3.80–5.00)
RDW: 13.5 % (ref 10.5–14.5)
WBC: 6.4 10*3/uL (ref 4.0–11.0)

## 2019-01-28 LAB — PT/INR
PT INR: 2 — ABNORMAL HIGH (ref 0.9–1.2)
PT: 20.8 s — ABNORMAL HIGH (ref 9.4–11.3)

## 2019-01-28 MED ORDER — METHYLPREDNISOLONE SODIUM SUCC 40 MG IJ SOLR
40.00 mg | Freq: Four times a day (QID) | INTRAMUSCULAR | Status: DC
Start: 2019-01-28 — End: 2019-01-29
  Administered 2019-01-28 – 2019-01-29 (×5): 40 mg via INTRAVENOUS
  Filled 2019-01-28 (×5): qty 1

## 2019-01-28 MED ORDER — DILTIAZEM HCL ER COATED BEADS 180 MG PO CP24
180.00 mg | ORAL_CAPSULE | Freq: Every day | ORAL | Status: DC
Start: 2019-01-28 — End: 2019-01-30
  Administered 2019-01-28 – 2019-01-30 (×3): 180 mg via ORAL
  Filled 2019-01-28 (×3): qty 1

## 2019-01-28 MED ORDER — FUROSEMIDE 40 MG PO TABS
40.0000 mg | ORAL_TABLET | Freq: Two times a day (BID) | ORAL | Status: DC
Start: 2019-01-28 — End: 2019-01-30
  Administered 2019-01-28 – 2019-01-30 (×4): 40 mg via ORAL
  Filled 2019-01-28 (×4): qty 1

## 2019-01-28 MED ORDER — FLUTICASONE FUROATE-VILANTEROL 100-25 MCG/INH IN AEPB
1.00 | INHALATION_SPRAY | Freq: Every morning | RESPIRATORY_TRACT | Status: DC
Start: 2019-01-28 — End: 2019-01-30
  Administered 2019-01-28 – 2019-01-30 (×3): 1 via RESPIRATORY_TRACT

## 2019-01-28 MED ORDER — FLUTICASONE FUROATE-VILANTEROL 200-25 MCG/INH IN AEPB
1.00 | INHALATION_SPRAY | Freq: Every morning | RESPIRATORY_TRACT | Status: DC
Start: 2019-01-28 — End: 2019-01-28

## 2019-01-28 MED ORDER — TIOTROPIUM BROMIDE MONOHYDRATE 2.5 MCG/ACT IN AERS
2.00 | INHALATION_SPRAY | Freq: Every morning | RESPIRATORY_TRACT | Status: DC
Start: 2019-01-28 — End: 2019-01-30
  Administered 2019-01-28 – 2019-01-30 (×3): 2 via RESPIRATORY_TRACT
  Filled 2019-01-28: qty 1

## 2019-01-28 MED ORDER — VH POTASSIUM CHLORIDE CRYS ER 10 MEQ PO TBCR (WRAP)
10.00 meq | EXTENDED_RELEASE_TABLET | Freq: Two times a day (BID) | ORAL | Status: DC
Start: 2019-01-28 — End: 2019-01-30
  Administered 2019-01-28 – 2019-01-30 (×4): 10 meq via ORAL
  Filled 2019-01-28 (×4): qty 1

## 2019-01-28 NOTE — Progress Notes (Signed)
Referral sent to [facility]

## 2019-01-28 NOTE — Progress Notes (Signed)
CM met with patient this morning to follow up about her choice for [facility] at time of discharge. At this time, patient is declining HH and states that if she changes her mind, she will let us know. At present all questions have been answered. CM will continue to follow and assess for any needs.

## 2019-01-28 NOTE — Plan of Care (Signed)
Patient is lying in bed with wheels locked and bed in lowest position. Call bell and water within reach. Posey alarm on. She reports no pain. Oxygen in place at 2L n/c. Patient experiences minimal SOB with exertion. Bladder scans continue q6h. Last revealed 0mL after voiding. PRN Tessalon Perle given x1 this shift. Will continue to monitor.

## 2019-01-28 NOTE — OT Plan of Care Note (Signed)
[facility]  [address]  [place], [place] [address]    Department of Rehabilitation  [phone]    Vernisha Bacote [facility] CSN: [id number]  MEDICAL/SURGICAL 203/203-A    Occupational Therapy General Note  Time: 2440-1027    Order received for OT eval/screen for potential OT needs with subsequent chart review completed. Prior to evaluation attempt, PT screen completed and per nursing staff and PT communication, pt appears to be at/near functional baseline with ADLs and mobility. Due to this report, OT screen initiated. Per communication with pt, she denies concerns participating in ADLs or mobility. Pt confirms that she has FWW and rollator for use at home and the plan is for pt to return home with family support (adult children reside with her). Pt educated on the benefits of sitting up OOB during meals to reduce risk of aspiration pneumonia, importance of participating in ADLs at max capacity to maintain strength and independence during prolonged hospitalization and mobilizing in the hallways to prevent secondary complications. Pt verbalized good understanding of the aforementioned and pt agreed to continue to participate in self-care at max capacity and mobilize on unit during her LOS. Pt with no current OT needs and therefore full evaluation will not be completed this date. Should pt experience a change in status prior to discharge, please re-submit new evaluation orders. Thank you for this referral.

## 2019-01-28 NOTE — PT Plan of Care Note (Signed)
[facility]    [facility]  [address]  [place], [place] [address]    Department of Rehabilitation  [phone]    Dann Ventress [facility] CSN: [id number]  @[email or web address] @ 203/203-A    Physical Therapy Screen  Time: 2:19pm    Discussed with Dr. Amada Jupiter that I observed pt up earlier today ambulating with steady and continuous gait pattern in hallway with Lequita Halt, CNA. Per Dr. Amada Jupiter, ok to screen pt to determine further PT assessment/needs. This afternoon, pt up in the bathroom with Fleet Contras, CNA, completing all toileting/peri-care independently. Pt able to amb in room with no device without unsteadiness or LOB. Verbal cueing was provided to reinforce monitoring of O2 tubing. Pt was dismissive of my education regarding need to carefully monitor O2 tubing to avoid a fall. At this time, pt appears at her functional baseline. No further acute PT needs.     Communication: ("X" All that apply)       RN        PA/NP        MD        PT/OT/SLP        Patient x       Family/caregiver        Other (specify) X CNA       Pt/family Education:  (X All That Apply)        Benefits of therapy        Effects of immobility        Effects on d/c plan        MD order of therapy        Other (specify) Monitor of O2 tubing during amb     Plan: D/C PT evaluation; Nursing staff to reinforce monitoring of O2 tubing during ambulation. Whiteboard updated to reflect need for education/instruction on O2 tubing management during ambulation to reduce fall risk.     Alvino Chapel, DPT

## 2019-01-28 NOTE — Progress Notes (Addendum)
So she is pretty interesting.  On admission she was complaining of shortness of breath and I thought based on cardiac enlargement and atrial fibrillation that the shortness of breath was likely due to cardiac pulmonary edema and indeed following diuresis she improved.  Now she is complaining of shortness of breath and wheezing.  It appears that she also has asthma.  This is not cardiac asthma however.  Yesterday when I saw her she was complaining of headache and nausea although she had eaten some breakfast.  We gave her Toradol for the headache and Zofran for the nausea and placed her back in bed and within a few hours she was feeling better.  She continues to feel better this morning.  I asked her how she was feeling and she said "if I could just get rid of this wheezing and shortness of breath."  So now she has asthma.    Vitals:    01/28/19 0740   BP:    Pulse: 89   Resp: 16   Temp:    SpO2: 95%   Lungs are clear on inspiration but during expiration she has end expiratory wheezes and a cough  Cardiac rhythm irregular S1-S2, no murmur.  I thought for a moment I felt an RV heave but I cannot reproduce this.  There is no LV heave  Abdomen soft and nontender  No edema    Results     Procedure Component Value Units Date/Time    Basic Metabolic Panel [[id number]]  (Abnormal) Collected: 01/28/19 0610    Specimen: Plasma Updated: 01/28/19 0653     Sodium 133 mMol/L      Potassium 4.2 mMol/L      Chloride 91 mMol/L      CO2 35.0 mMol/L      Calcium 9.9 mg/dL      Glucose 86 mg/dL      Creatinine 4.09 mg/dL      BUN 59 mg/dL      Anion Gap 81.1 mMol/L      BUN / Creatinine Ratio 46.5 Ratio      EGFR 40 mL/min/1.25m2      Osmolality Calculated 282 mOsm/kg     Prothrombin time/INR [[id number]]  (Abnormal) Collected: 01/28/19 0610    Specimen: Blood Updated: 01/28/19 0648     PT 20.8 sec      PT INR 2.0    CBC and differential [[id number]]  (Abnormal) Collected: 01/28/19 0610    Specimen: Blood Updated: 01/28/19 0644     WBC 6.4  K/cmm      RBC 3.99 M/cmm      Hemoglobin 11.9 gm/dL      Hematocrit 46.9 %      MCV 97 fL      MCH 30 pg      MCHC 31 gm/dL      RDW 62.9 %      PLT CT 118 K/cmm      MPV 8.0 fL      Neutrophils % 74.8 %      Lymphocytes 12.9 %      Monocytes 11.0 %      Eosinophils % 0.3 %      Basophils % 0.9 %      Neutrophils Absolute 4.8 K/cmm      Lymphocytes Absolute 0.8 K/cmm      Monocytes Absolute 0.7 K/cmm      Eosinophils Absolute 0.0 K/cmm      Basophils Absolute 0.1 K/cmm  She had a cardiac echo today.  I will review that later.  Today it appears that she is exacerbating this asthma condition.  We will start her on IV Solu-Medrol, add Breo Ellipta, and add tiotropium Respimat.  We will get her up and out of bed.  I will get formal evaluations with PT/OT and they can follow her as she recovers from her asthma.  I will talk to case management about a potential swing bed transfer after she completes 3 nights of inpatient care.    Active Hospital Problems    Diagnosis    Moderate asthma with acute exacerbation    Acute on chronic diastolic (congestive) heart failure    Nausea    Headache    Constipation    Chronic respiratory failure with hypoxia, on home O2 therapy    Thoracic aortic aneurysm without rupture    Acute exacerbation of CHF (congestive heart failure)    Permanent atrial fibrillation    Acute on chronic congestive heart failure     Electronically signed by Graciela Husbands D.O.

## 2019-01-28 NOTE — Plan of Care (Signed)
Problem: Moderate/High Fall Risk Score >5  Goal: Patient will remain free of falls  Outcome: Progressing     Problem: Inadequate Gas Exchange  Goal: Adequate oxygenation and improved ventilation  Description: Interventions  1. Assess lung sounds   2. Monitor SpO2 and treat as needed  3. Monitor and treat ETCO2  4. Provide mechanical and oxygen support to facilitate gas exchange  5. Position for maximum ventilatory efficiency  6. Teach/reinforce use of incentive spirometer 10 times per hour while awake, cough and deep breath as needed  7. Plan activities to conserve energy: plan rest periods  8. Increase activity as tolerated/progressive mobility  9. Consult/collaborate with Respiratory Therapy  Outcome: Progressing     Problem: Inadequate Airway Clearance  Goal: Normal respiratory rate/effort achieved/maintained  Description: Interventions:  1. Plan activities to conserve energy: plan rest periods  Outcome: Progressing     Problem: Impaired Mobility  Goal: Mobility/Activity is maintained at optimal level for patient  Description: Interventions:  1. Increase mobility as tolerated/progressive mobility  2. Encourage independent activity per ability  3. Maintain proper body alignment  4. Perform active/passive ROM  5. Plan activities to conserve energy, plan rest periods  6. Reposition patient every 2 hours and as needed unless able to reposition self  7. Assess for changes in respiratory status, level of consciousness and/or development of fatigue  8. Consult/collaborate with Physical Therapy and/or Occupational Therapy  Outcome: Progressing

## 2019-01-28 NOTE — Progress Notes (Signed)
CM did follow up with patient this afternoon to discuss CNCM since she is not receptive to [facility] now. Patient agreeable to [facility] referral. Referral sent to [facility].

## 2019-01-29 LAB — PT/INR
PT INR: 1.8 — ABNORMAL HIGH (ref 0.9–1.2)
PT: 18.8 s — ABNORMAL HIGH (ref 9.4–11.3)

## 2019-01-29 MED ORDER — METHYLPREDNISOLONE SODIUM SUCC 40 MG IJ SOLR
40.00 mg | Freq: Two times a day (BID) | INTRAMUSCULAR | Status: DC
Start: 2019-01-29 — End: 2019-01-30
  Administered 2019-01-29 – 2019-01-30 (×2): 40 mg via INTRAVENOUS
  Filled 2019-01-29 (×2): qty 1

## 2019-01-29 MED ORDER — HYDROCODONE-ACETAMINOPHEN 5-325 MG PO TABS
1.0000 | ORAL_TABLET | Freq: Four times a day (QID) | ORAL | Status: DC | PRN
Start: 2019-01-29 — End: 2019-01-30
  Administered 2019-01-29 (×2): 1 via ORAL
  Filled 2019-01-29 (×2): qty 1

## 2019-01-29 NOTE — Plan of Care (Signed)
Progressing, continue POC.

## 2019-01-29 NOTE — Progress Notes (Signed)
She is much better today.  She has very little coughing and no wheezing.  She does have more of a tremor.  I think this is due to our beta adrenergic medication.  She is not a candidate for a beta-blocker because of her asthma.  The heart failure appears under control as well.  She had an echo yesterday I forgot to look at that this morning.  I will comment on that tomorrow.    Vitals:    01/29/19 0816   BP: 144/76   Pulse: (!) 104   Resp:    Temp:    SpO2:      Cardiac rhythm is irregular S1-S2 heard.  No murmur no gallop  Lung fields are clear bilaterally.  Tidal volume is good.  Abdomen is soft and nontender  There is no edema    Results     Procedure Component Value Units Date/Time    Prothrombin time/INR [[id number]]  (Abnormal) Collected: 01/29/19 0622    Specimen: Blood Updated: 01/29/19 0716     PT 18.8 sec      PT INR 1.8        Active Hospital Problems    Diagnosis   . Moderate asthma with acute exacerbation   . Acute on chronic diastolic (congestive) heart failure   . Nausea   . Headache   . Constipation   . Chronic respiratory failure with hypoxia, on home O2 therapy   . Thoracic aortic aneurysm without rupture   . Acute exacerbation of CHF (congestive heart failure)   . Permanent atrial fibrillation   . Acute on chronic congestive heart failure     Plan: We will get her up and walk in the hallway today.  I would start her back on hydrocodone for pain.  I will give her a prescription for this when she leaves here.    Electronically signed by Graciela Husbands D.O.

## 2019-01-30 ENCOUNTER — Telehealth (RURAL_HEALTH_CENTER): Payer: Self-pay | Admitting: Internal Medicine

## 2019-01-30 ENCOUNTER — Ambulatory Visit: Payer: Self-pay

## 2019-01-30 ENCOUNTER — Other Ambulatory Visit (RURAL_HEALTH_CENTER): Payer: Self-pay | Admitting: Internal Medicine

## 2019-01-30 DIAGNOSIS — J45998 Other asthma: Secondary | ICD-10-CM

## 2019-01-30 DIAGNOSIS — I2729 Other secondary pulmonary hypertension: Secondary | ICD-10-CM

## 2019-01-30 DIAGNOSIS — I482 Chronic atrial fibrillation, unspecified: Secondary | ICD-10-CM

## 2019-01-30 DIAGNOSIS — J9621 Acute and chronic respiratory failure with hypoxia: Secondary | ICD-10-CM

## 2019-01-30 DIAGNOSIS — I4821 Permanent atrial fibrillation: Secondary | ICD-10-CM

## 2019-01-30 DIAGNOSIS — Z950 Presence of cardiac pacemaker: Secondary | ICD-10-CM

## 2019-01-30 DIAGNOSIS — I272 Pulmonary hypertension, unspecified: Secondary | ICD-10-CM | POA: Diagnosis present

## 2019-01-30 LAB — PT/INR
PT INR: 1.7 — ABNORMAL HIGH (ref 0.9–1.2)
PT: 18.2 s — ABNORMAL HIGH (ref 9.4–11.3)

## 2019-01-30 MED ORDER — TIOTROPIUM BROMIDE MONOHYDRATE 2.5 MCG/ACT IN AERS
2.00 | INHALATION_SPRAY | Freq: Every day | RESPIRATORY_TRACT | 5 refills | Status: DC
Start: 2019-01-30 — End: 2019-07-10

## 2019-01-30 MED ORDER — INFLUENZA VAC HIGH-DOSE QUAD 0.7 ML IM SUSY
0.50 mL | PREFILLED_SYRINGE | INTRAMUSCULAR | Status: DC | PRN
Start: 2019-01-30 — End: 2019-01-30
  Filled 2019-01-30: qty 0.7

## 2019-01-30 MED ORDER — WARFARIN SODIUM 5 MG PO TABS
5.0000 mg | ORAL_TABLET | Freq: Every day | ORAL | Status: DC
Start: 2019-01-30 — End: 2019-01-30

## 2019-01-30 MED ORDER — FLUTICASONE FUROATE-VILANTEROL 100-25 MCG/INH IN AEPB
1.00 | INHALATION_SPRAY | Freq: Every day | RESPIRATORY_TRACT | 5 refills | Status: DC
Start: 2019-01-30 — End: 2019-07-10

## 2019-01-30 MED ORDER — POTASSIUM CHLORIDE ER 10 MEQ PO TBCR
10.00 meq | EXTENDED_RELEASE_TABLET | Freq: Two times a day (BID) | ORAL | 5 refills | Status: DC
Start: 2019-01-30 — End: 2019-03-01

## 2019-01-30 MED ORDER — INFLUENZA VAC HIGH-DOSE QUAD 0.7 ML IM SUSY
0.50 mL | PREFILLED_SYRINGE | INTRAMUSCULAR | Status: DC | PRN
Start: 2019-01-30 — End: 2019-01-30

## 2019-01-30 MED ORDER — HYDROCODONE-ACETAMINOPHEN 5-325 MG PO TABS
1.00 | ORAL_TABLET | Freq: Four times a day (QID) | ORAL | 0 refills | Status: AC | PRN
Start: 2019-01-30 — End: 2019-02-06

## 2019-01-30 NOTE — Progress Notes (Signed)
fluzone given in L deltoid  Discharge education and instruction reviewed with and understood by patient

## 2019-01-30 NOTE — Progress Notes (Signed)
Pt sitting in chair without complaints at this time. Pt ambulated 258ft on 2L NC with no assistive devices. Pt SP02 remained between 90-92%, however, pt did get SHB during ambulation. Pt returned to chair, chair alarm on, call bell within reach.

## 2019-01-30 NOTE — Progress Notes (Signed)
Pharmacy Warfarin Per Protocol Note  [name]    Age: 78 y.o.  Weight: 76.3 kg (168 lb 3.4 oz)  Indication: Afib  Goal INR: 2 - 3      Past Medical History:   Diagnosis Date    Aortic aneurysm     Atrial fibrillation     Chronic obstructive pulmonary disease     Coronary artery disease     Gastroesophageal reflux disease     Hypertension     Malignant neoplasm     Pacemaker     SSS (sick sinus syndrome)        Recent Labs   Lab 01/30/19  0604 01/29/19  0622 01/28/19  0610   PT INR 1.7* 1.8* 2.0*       Recent Labs   Lab 01/28/19  0610   WBC 6.4   Hemoglobin 11.9*   Hematocrit 38.9   PLT CT 118*        Recent Labs   Lab 01/24/19  1630   Albumin 4.1       Lab Results   Component Value Date    ALT 7 01/24/2019    AST 22 01/24/2019    ALKPHOS 131 01/24/2019    BILITOTAL 0.4 01/24/2019       Warfarin level I protocol ordered.  Warfarin 5 mg will be ordered for today.      If you have any questions, please contact the pharmacist at [phone].    Francisca December, [facility]

## 2019-01-30 NOTE — Discharge Instr - Appointments (Signed)
[name] from [employer] will call appointment.

## 2019-01-30 NOTE — Plan of Care (Signed)
POC is adequate for discharge. Potential discharge this date pending Pt respiratory status.

## 2019-01-30 NOTE — Discharge Instr - AVS First Page (Signed)
When you go home be careful with salt in your diet.  Stay away from snacks and junk food.  Remember him how I told you to shop a couple of years ago when you go to the grocery store.  Stay around the rim of the store.    I have ordered 2 new inhalers for you that you be getting in here.  One is Magazine features editor.  This is taken once per day and your to rinse your mouth afterwards.  The other is Spiriva Respimat.  Take 2 inhalations once per day.  If you have asthma with wheezing and coughing using albuterol nebulizer.  Otherwise do not use this nebulizer on a routine basis.  It will make your tremor worse.    Go back on your diltiazem XR for heart rate control.  Take your Lasix twice a day.  Take your supplemental potassium that I have ordered as well.  Stop taking atenolol which may aggravate your asthma.      I will see you in the office in a couple of weeks.

## 2019-01-30 NOTE — Telephone Encounter (Signed)
Pt called back and confirmed appt date and time.Marland KitchenMarland KitchenMarland KitchenMarland Kitchenpkt

## 2019-01-30 NOTE — Telephone Encounter (Signed)
L/M on voicemail of pt's hospital F/U I asked pt to please call and confirm this day works for her.Marland KitchenMarland KitchenMarland KitchenMarland Kitchenpkt

## 2019-01-30 NOTE — Telephone Encounter (Signed)
[name], the [name]  called down saying Dr.Dale is discharging [name] and wants to do a HFU in 2 weeks, She is not his patient, Fransico Him was unsure if she wants to be established with Dr.Dale of of this is just a 1 time thing. Please confirm with Dr.Dale. He was full so she needs to be put on the schedule. I will be happy to call her if needed to let her know when her HFU appt is.   Thanks  Kmorris.

## 2019-01-30 NOTE — Discharge Summary (Signed)
Medicine Discharge Summary   [facility]  [facility]   Patient Name: [facility] [name]   Attending Physician: Graciela Husbands, DO PCP: Ladona Horns, MD   Date of Admission: 01/24/2019 D/C Date: 01/30/2019   Discharge Diagnoses:     Acute on chronic hypoxic respiratory failure  Acute on chronic diastolic congestive heart failure  Acute exacerbation of chronic asthma  Home oxygen dependency  Secondary pulmonary hypertension  Chronic atrial fibrillation (has pacemaker)     Hospital Course       [name] is a 78 y.o. female patient that was admitted on 01/24/2019 she presented to the emergency room with difficulty breathing.  Chest x-ray on admission showed marketed cardiac enlargement.  There were no pleural effusions or peripheral edema.  It was felt by the ED physician and the admitting hospitalist via telemedicine that the patient had asthma.  On admission emphasis was placed on COPD/asthma and she was receiving IV steroids and nebulizers when I saw her on the second hospital day.  Based on the fact that the heart was markedly enlarged and that she was in atrial fibrillation I felt that this shortness of breath on admission was likely caused by pulmonary edema from heart failure.  I had seen her in 2018 in similar circumstances.  We started her on IV Lasix with a big urine output and subsequent improvement by the next day.  The patient had one good day and then began getting short of breath again and this time she was wheezing.  Now I felt she had asthma.  We started her on IV Solu-Medrol and placed her on nebulizers again using albuterol.  I started her on tiotropium Respimat and Breo Ellipta and she improved in a couple of days.  She did have 1 day where she had headache and nausea.  That day I held her Lasix and restarted the Lasix 2 days later.  She is now going home on Lasix and these inhalers and I think we should be good.   I also gave her a prescription for hydrocodone which helps her back.  She has had a series of recent steroid injections in her back in an effort to get her off opioids and they have not worked.  I also took this opportunity to stop Sinemet which have been started a couple of years ago for tremor.  This patient does not have Parkinson's and the Sinemet did not seem to help the tremor so I stopped it.  At the time of transfer she is feeling really good.  She is getting up and walking in the hallway on her supplemental oxygen.  She is able to return home.  She did ask me if I would consider taking her in my practice since she has to travel long distance to see her doctor in [place] and I told her that if she wanted to come see me I would accept her.    Active Problems:    Acute on chronic congestive heart failure    Permanent atrial fibrillation    Acute exacerbation of CHF (congestive heart failure)    Thoracic aortic aneurysm without rupture    Chronic respiratory failure with hypoxia, on home O2 therapy    Acute on chronic diastolic (congestive) heart failure    Moderate asthma with acute exacerbation    Pulmonary HTN  Resolved Problems:    Nausea    Headache    Constipation  Pending Results and other significant studies:  None     Discharge Instructions:          Disposition: Home  Diet: 4 Gram Sodium (Salt) Restricted Diet  Activity: May resume normal activities  Discharge Code Status: NO CPR  -  ALLOW NATURAL DEATH    Rakaric, Valinda Hoar, MD  [facility].  [address]  [place] [address]  [phone]    Follow up  Monday February 03, 2019 at 11:30am       Discharge Medications:                                                                        Discharge Medication List      Taking    albuterol (2.5 MG/3ML) 0.083% nebulizer solution  Dose: 2.5 mg  Commonly known as: PROVENTIL  Take 2.5 mg by nebulization every 6 (six) hours as needed for Wheezing.     benzonatate 200 MG capsule  Dose: 200  mg  Commonly known as: TESSALON  Take 200 mg by mouth 3 (three) times daily as needed.     dilTIAZem 240 MG 24 hr capsule  Dose: 240 mg  Commonly known as: TIAZAC  Take 240 mg by mouth 2 (two) times daily.      esomeprazole 40 MG capsule  Commonly known as: NexIUM  TAKE 1 CAPSULE BY MOUTH EVERY DAY-STOP OMEPRAZOLE     estradiol 0.1 MG/GM vaginal cream  Dose: 2 g  Commonly known as: ESTRACE  Place 2 g vaginally daily.     fluorometholone 0.1 % ophthalmic suspension  Dose: 1 drop  Commonly known as: EFLONE  Place 1 drop into the left eye 4 (four) times daily      fluticasone furoate-vilanterol 100-25 MCG/INH Aepb  Dose: 1 puff  Commonly known as: BREO ELLIPTA  Inhale 1 puff into the lungs daily     furosemide 40 MG tablet  Commonly known as: LASIX  TAKE 1 TABLET BY MOUTH TWO TIMES DAILY ON AN EMPTY STOMACH     HYDROcodone-acetaminophen 5-325 MG per tablet  Dose: 1 tablet  Commonly known as: NORCO  Take 1 tablet by mouth every 6 (six) hours as needed for Pain     ketoconazole 2 % cream  Commonly known as: NIZORAL  Apply topically daily.     latanoprost 0.005 % ophthalmic solution  Dose: 1 drop  Commonly known as: XALATAN  Place 1 drop into both eyes nightly      potassium chloride 10 MEQ tablet  Dose: 10 mEq  Commonly known as: K-DUR  Take 1 tablet (10 mEq total) by mouth 2 (two) times daily     rosuvastatin 10 MG tablet  Dose: 10 mg  Commonly known as: CRESTOR  Take 10 mg by mouth daily.     tiotropium 2.5 MCG/ACT inhalation spray  Dose: 2 puff  Commonly known as: SPIRIVA RESPIMAT  Inhale 2 puffs into the lungs daily     warfarin 3 MG tablet  Dose: 3 mg  What changed:    how much to take   additional instructions  Commonly known as: COUMADIN  Take 1 tablet (3 mg total) by mouth daily.        STOP  taking these medications    atenolol 50 MG tablet  Commonly known as: TENORMIN     carbidopa-levodopa 25-100 MG per tablet  Commonly known as: SINEMET     spironolactone 50 MG tablet  Commonly known as: ALDACTONE              Discharge Day Exam (01/30/2019):     Blood pressure 143/71, pulse 84, temperature (!) 96.1 F (35.6 C), temperature source Oral, resp. rate 17, height 1.651 m (5\' 5" ), weight 77.4 kg (170 lb 10.2 oz), SpO2 98 %.  Lungs are clear on the day of discharge.  Her cardiac rhythm is irregular consistent with atrial fibrillation.     Recent Labs      Recent Labs   Lab 01/28/19  0610 01/25/19  0620 01/24/19  1630   WBC 6.4 5.2 6.4   RBC 3.99 3.71* 3.93   Hemoglobin 11.9* 11.0* 11.8*   Hematocrit 38.9 36.5 38.9   MCV 97 98 99   PLT CT 118* 94* 99*     Recent Labs   Lab 01/30/19  0604 01/29/19  0622 01/28/19  0610 01/27/19  0635 01/26/19  0537   PT 18.2* 18.8* 20.8* 21.1* 30.7*   PT INR 1.7* 1.8* 2.0* 2.0* 3.0*     Recent Labs   Lab 01/24/19  1910 01/24/19  1630   Troponin I 0.02 0.04*     No results found for: [id number]  Recent Labs   Lab 01/28/19  0610 01/27/19  0635 01/26/19  0537 01/25/19  0620 01/24/19  1630   Glucose 86 89 119*  --  89   Sodium 133* 135* 133*  --  140   Potassium 4.2 3.8 4.1  --  4.9   Chloride 91* 90* 90*  --  100   CO2 35.0* 35.0* 34.0*  --  31.0*   BUN 59* 52* 52*  --  51*   Creatinine 1.27* 1.06 1.12 1.21* 1.41*   EGFR 40* 50* 47* 42* 35*   Calcium 9.9 10.5 10.9*  --  10.7*     Recent Labs   Lab 01/24/19  1630   Albumin 4.1   Protein, Total 6.8   Bilirubin, Total 0.4   Alkaline Phosphatase 131   ALT 7   AST (SGOT) 22        Allergies:      Adhesive [wound dressing adhesive], Codeine, Morphine, Nitroglycerin, and Pain patch [menthol]   Time spent on discharging the patient:  30 minutes   Xr Humerus Right Ap Lateral    Result Date: 01/24/2019  Marked radiopaque contrast infiltrate of the right arm soft tissues from forearm/elbow to the upper humeral/axillary region. Diffuse osteopenia. No acute fracture or dislocation, though bones partly obscured by contrast material. -Recommend protocol measures, including compresses, arm elevation, and careful close monitoring for developing compartment  syndrome, perfusion changes, pain/neuropathy, and/or skin alterations. Immediate/continued medical counseling should be sought if changes occur or symptoms do not abate. ReadingStation:[id number]    Ct Angiogram Chest (pe)    Result Date: 01/24/2019  1.  INSUFFICIENT OPACIFICATION TO EVALUATE FOR PE. 2.  SEVERE CARDIOMEGALY, WITH TRACE PERICARDIAL FLUID. 3.  ANEURYSMAL THORACIC AORTA PER ABOVE. ReadingStation:[id number]    Xr Chest Ap Portable    Result Date: 01/24/2019  No acute cardiopulmonary findings given portable technique. ReadingStation:[id number]     Home Health Needs:  There are no questions and answers to display.      Graciela Husbands, DO  01/30/19 11:50 AM   MRN: [id number]                                      CSN: [id number] DOB: Feb 02, 1941

## 2019-01-30 NOTE — Progress Notes (Signed)
Patient discharging home today. Per patient, daughter will transport home. At present all questions have been answered.        01/30/19 1118   Discharge Disposition   Patient preference/choice provided? Yes   Physical Discharge Disposition Home   Mode of Transportation Car   Patient/Family/POA notified of transfer plan Yes   Patient agreeable to discharge plan/expected d/c date? Yes   Family/POA agreeable to discharge plan/expected d/c date? Yes   Special requirements for patient during transport: Oxygen   Outpatient Services   Services Community Health Nurse   CM Interventions   Follow up appointment scheduled? Yes  (Dr. Fransisca Kaufmann 02/03/19 at 1130)   Referral made for home health RN visit? Patient declined   Multidisciplinary rounds/family meeting before d/c? Yes   Medicare Checklist   Is this a Medicare patient? Yes   Patient received 1st IMM Letter? Yes   3 midnight inpatient qualifying stay (SNF only) Yes   If LOS 3 days or greater, did patient received 2nd IMM Letter? Yes

## 2019-01-31 ENCOUNTER — Telehealth (RURAL_HEALTH_CENTER): Payer: Self-pay

## 2019-01-31 NOTE — Telephone Encounter (Signed)
Spoke with patient over the telephone as part of her transition  care management following her recent hospitalization for     Acute on chronic hypoxic respiratory failure  Acute on chronic diastolic congestive heart failure  Acute exacerbation of chronic asthma  Home oxygen dependency  Secondary pulmonary hypertension  Chronic atrial fibrillation (has pacemaker)    Pt reports that she is doing much better since she is back home. Pt states  that she is unable to review her medications with me today due to having  Visitors. She also tells me that her daughter will be there to speak with me when   Call back next week.     Pt tells me that she is a "Dr. Fransisca Kaufmann patient" but intends to change to Dr. Amada Jupiter in   [place] because of the inability to travel to [facility] to see him.     Patient denies any needs today. I will call her back on Monday.

## 2019-02-04 ENCOUNTER — Telehealth (RURAL_HEALTH_CENTER): Payer: Self-pay

## 2019-02-04 NOTE — Telephone Encounter (Signed)
Spoke with patient over the telephone as part of her transition care management following a recent hospitalizations for     Acute on chronic hypoxic respiratory failure  Acute on chronic diastolic congestive heart failure  Acute exacerbation of chronic asthma  Home oxygen dependency  Secondary pulmonary hypertension  Chronic atrial fibrillation (has pacemaker).     Pt reports that she had a visit with Dr. Fransisca Kaufmann her PCP yesterday and that she is feeling the best that she has in a long time. Reports that she has been able to walk and that this is something that she hasn't been able to do in a long time.     Reports that she continues to have some swelling in lower extremities. Encouraged to rest with legs propped up as much as possible and to monitor & limit fluid intake along with daily weights.   She reports that she uses her continuous 02 at 2L with no recent issues.    Pt tells me that she plans to change her PCP from Dr. Fransisca Kaufmann to Dr. Amada Jupiter. Reports that she has a follow up appointment at the coumadin clinic on Thursday as well as a follow up appointment with dr. Amada Jupiter next week. Patient states that she plans to speak with Dr. Amada Jupiter concerning her wishes for him to be her PCP. Pt states that she is taking all of her medications as Dr. Amada Jupiter ordered. Denies any needs at this time.      ASSESSMENT:  1) Elevate lower extremities   2) 2L continuous oxygen.  3) Daily weights   4) Follow up appointments  5) PCP      PLAN:  1) Encouraged patient to elevate lower extremities as much as possible.   2) Continue to wear 02 @ 2L  3) Continue to check daily weights and to report an increase of 3 lbs in one day or 5 lbs in one week.   4) Keep all scheduled appointments and will follow up with patient again next week October 13 th    5) Encouraged patient to call or return to the ER for any worsening issues

## 2019-02-06 ENCOUNTER — Ambulatory Visit: Payer: Medicare Other | Attending: Internal Medicine

## 2019-02-06 DIAGNOSIS — I4891 Unspecified atrial fibrillation: Secondary | ICD-10-CM | POA: Insufficient documentation

## 2019-02-06 DIAGNOSIS — I4821 Permanent atrial fibrillation: Secondary | ICD-10-CM

## 2019-02-06 DIAGNOSIS — Z5181 Encounter for therapeutic drug level monitoring: Secondary | ICD-10-CM | POA: Insufficient documentation

## 2019-02-06 DIAGNOSIS — Z7901 Long term (current) use of anticoagulants: Secondary | ICD-10-CM | POA: Insufficient documentation

## 2019-02-06 LAB — POCT PROTHROMBIN TIME (PT): INR POCT: 1.5 — AB (ref 2–3)

## 2019-02-06 NOTE — Progress Notes (Signed)
Anticoagulation Progress Report   February 06, 2019  Name: [name], [name] [facility]    Medical Record Number: [id number]  Reason for Treatment: Atrial fibrillation unspecified     INR Range: 2.0-3.0    Date: 2019-02-06 15:32  Location: [facility]    Update Note: Vitamin K info given and discussed.   Medication/OTC/herbals/ETOH reviewed. Strength of tablet, color and shape   reviewed. Signs and symptoms were reviewed of clots and bleeding.     Submitted by Kennyth Arnold RN

## 2019-02-06 NOTE — Progress Notes (Signed)
Anticoagulation Progress Report   February 06, 2019  Name: [name], [name] [facility]    Medical Record Number: [id number]  Reason for Treatment: Atrial fibrillation unspecified     INR Range: 2.0-3.0    Date: 2019-02-06 15:13  Location: [facility]    INR: 1.50    Tablet Strength: 5 mg    Next INR: 7 days 2019-02-13    Progress Note: Patient states she has been taking 5mg  tablets of Coumadin   and never picked up the 3mg  Coumadin tablets from the pharmacy. I will make   Dr.Dale aware and see if he would call the 5mg  tablet to the pharmacy. I   will increase the Coumadin tonight and retest in 1 week.     Dosage Out: :Su-2.5mg :M-5mg :T-2.5mg :W-2.5mg :Th-7.5mg :F-2.5mg :Sa-2.5mg :

## 2019-02-08 ENCOUNTER — Emergency Department
Admission: EM | Admit: 2019-02-08 | Discharge: 2019-02-08 | Disposition: A | Discharge status: Home / Self Care | Payer: Medicare Other | Attending: Emergency Medicine | Admitting: Emergency Medicine

## 2019-02-08 DIAGNOSIS — I10 Essential (primary) hypertension: Secondary | ICD-10-CM | POA: Insufficient documentation

## 2019-02-08 DIAGNOSIS — R0602 Shortness of breath: Secondary | ICD-10-CM | POA: Insufficient documentation

## 2019-02-08 DIAGNOSIS — R338 Other retention of urine: Secondary | ICD-10-CM

## 2019-02-08 LAB — VH URINALYSIS WITH MICROSCOPIC AND CULTURE IF INDICATED
Bilirubin, UA: NEGATIVE mg/dL
Blood, UA: NEGATIVE mg/dL
Glucose, UA: NEGATIVE mg/dL
Ketones UA: NEGATIVE mg/dL
Leukocyte Esterase, UA: NEGATIVE Leu/uL
Nitrite, UA: NEGATIVE
Protein, UR: NEGATIVE mg/dL
Urine Specific Gravity: 1.015 (ref 1.001–1.040)
Urobilinogen, UA: 1 mg/dL — AB
pH, Urine: 7 pH (ref 5.0–8.0)

## 2019-02-08 LAB — CBC AND DIFFERENTIAL
Basophils %: 0.6 % (ref 0.0–3.0)
Basophils Absolute: 0 10*3/uL (ref 0.0–0.3)
Eosinophils %: 0 % (ref 0.0–7.0)
Eosinophils Absolute: 0 10*3/uL (ref 0.0–0.8)
Hematocrit: 35.7 % — ABNORMAL LOW (ref 36.0–48.0)
Hemoglobin: 11.7 gm/dL — ABNORMAL LOW (ref 12.0–16.0)
Lymphocytes Absolute: 0.8 10*3/uL (ref 0.6–5.1)
Lymphocytes: 9.4 % — ABNORMAL LOW (ref 15.0–46.0)
MCH: 32 pg (ref 28–35)
MCHC: 33 gm/dL (ref 31–36)
MCV: 97 fL (ref 80–100)
MPV: 7.6 fL (ref 6.0–10.0)
Monocytes Absolute: 1 10*3/uL (ref 0.1–1.7)
Monocytes: 11.9 % (ref 3.0–15.0)
Neutrophils %: 78.1 % — ABNORMAL HIGH (ref 42.0–78.0)
Neutrophils Absolute: 6.3 10*3/uL (ref 1.7–8.6)
PLT CT: 123 10*3/uL — ABNORMAL LOW (ref 130–440)
RBC: 3.69 10*6/uL — ABNORMAL LOW (ref 3.80–5.00)
RDW: 13.9 % (ref 10.5–14.5)
WBC: 8 10*3/uL (ref 4.0–11.0)

## 2019-02-08 LAB — BASIC METABOLIC PANEL
Anion Gap: 16.9 mMol/L (ref 7.0–18.0)
BUN / Creatinine Ratio: 19.8 Ratio (ref 10.0–30.0)
BUN: 26 mg/dL — ABNORMAL HIGH (ref 7–22)
CO2: 30 mMol/L (ref 20.0–30.0)
Calcium: 9.9 mg/dL (ref 8.5–10.5)
Chloride: 91 mMol/L — ABNORMAL LOW (ref 98–110)
Creatinine: 1.31 mg/dL — ABNORMAL HIGH (ref 0.60–1.20)
EGFR: 38 mL/min/{1.73_m2} — ABNORMAL LOW (ref 60–150)
Glucose: 118 mg/dL — ABNORMAL HIGH (ref 71–99)
Osmolality Calculated: 274 mOsm/kg — ABNORMAL LOW (ref 275–300)
Potassium: 3.9 mMol/L (ref 3.5–5.3)
Sodium: 134 mMol/L — ABNORMAL LOW (ref 136–147)

## 2019-02-08 NOTE — ED Notes (Signed)
Assisted pt getting dressed.   Daughter called to come get her.

## 2019-02-08 NOTE — Discharge Instructions (Signed)
Need to follow up Monday to have catheter examined and hopefully take out.

## 2019-02-08 NOTE — ED Provider Notes (Signed)
Chief Complaint   Patient presents with   . Shortness of Breath        HPI The patient is a 78 y.o. female who presents to the ER with the complaint of not being able to urinate.  She was not exactly sure when the last time she urinated was.  The patient tells me that she feels like the lower abdomen is full and swollen.  She says that she feels short of breath but she really just seems very anxious to me.  She is hyperventilating in the room and stating that she needs to urinate but just cannot.  She has never really had problems like this before.  No fevers.  I reviewed her records and she recently was admitted to the hospital with a discharge 10 days ago for asthma and COPD problems.  She says that she has not been wheezing very much at home.  She says she is better from that standpoint.    ROS: 5 systems reviewed and negative unless noted in HPI    Past Medical History:   Diagnosis Date   . Aortic aneurysm    . Atrial fibrillation    . Chronic obstructive pulmonary disease    . Coronary artery disease    . Gastroesophageal reflux disease    . Hypertension    . Malignant neoplasm    . Pacemaker    . SSS (sick sinus syndrome)         Social History     Tobacco Use   . Smoking status: Never Smoker   . Smokeless tobacco: Never Used   Substance Use Topics   . Alcohol use: No   . Drug use: No          BP (!) 105/93   Pulse 87   Temp 98.2 F (36.8 C) (Tympanic)   Resp 18   Ht 1.651 m   Wt 80.9 kg   SpO2 99%   BMI 29.68 kg/m      Physical Exam:    General: well developed, no acute distress  Eyes: normal appearing. No icterus  Respiratory: no respiratory distress, I do not hear any acute wheezing  Cardiovascular : RRR.   Abdomen: soft, there is some mild tenderness in the suprapubic region.  Lower extremity strength is normal  Skin: no rashes  Psych: Anxious  Neuro: awake, alert , oriented.     MDM: The patient had almost 400 cc in her bladder.  She is on hydrocodone but I do not see an acute other reason for  her to have urinary retention.  No new medications.  No evidence of transverse myelitis.  Patient had blood work done here that did not show any acute findings.  I think it is reasonable to let her go home with a catheter.  The issue is that I am concerned that she may have to come right back.  She actually subjectively feels better following having her bladder drained.      Results     Procedure Component Value Units Date/Time    Basic Metabolic Panel [[id number]]  (Abnormal) Collected: 02/08/19 1845    Specimen: Plasma Updated: 02/08/19 1911     Sodium 134 mMol/L      Potassium 3.9 mMol/L      Chloride 91 mMol/L      CO2 30.0 mMol/L      Calcium 9.9 mg/dL      Glucose 657 mg/dL      Creatinine 8.46 mg/dL  BUN 26 mg/dL      Anion Gap 16.1 mMol/L      BUN / Creatinine Ratio 19.8 Ratio      EGFR 38 mL/min/1.85m2      Osmolality Calculated 274 mOsm/kg     CBC and differential [[id number]]  (Abnormal) Collected: 02/08/19 1845    Specimen: Blood Updated: 02/08/19 1858     WBC 8.0 K/cmm      RBC 3.69 M/cmm      Hemoglobin 11.7 gm/dL      Hematocrit 81.1 %      MCV 97 fL      MCH 32 pg      MCHC 33 gm/dL      RDW 91.4 %      PLT CT 123 K/cmm      MPV 7.6 fL      Neutrophils % 78.1 %      Lymphocytes 9.4 %      Monocytes 11.9 %      Eosinophils % 0.0 %      Basophils % 0.6 %      Neutrophils Absolute 6.3 K/cmm      Lymphocytes Absolute 0.8 K/cmm      Monocytes Absolute 1.0 K/cmm      Eosinophils Absolute 0.0 K/cmm      Basophils Absolute 0.0 K/cmm     Urinalysis w Microscopic and Culture if Indicated [[id number]]  (Abnormal) Collected: 02/08/19 1830    Specimen: Urine, Random Updated: 02/08/19 1838     Color, UA Yellow     Clarity, UA Clear     Urine Specific Gravity 1.015     pH, Urine 7.0 pH      Protein, UR Negative mg/dL      Glucose, UA Negative mg/dL      Ketones UA Negative mg/dL      Bilirubin, UA Negative mg/dL      Blood, UA Negative mg/dL      Nitrite, UA Negative     Urobilinogen, UA 1.0 mg/dL      Leukocyte  Esterase, UA Negative Leu/uL            No results found.       Disposition: home.  I discussed with her that she will need to follow-up in 2 days to have this rechecked.    Based on the patient's presentation today including history and physical examination, it is felt that the patient is safe for discharge at this time.    Diagnosis:    1. Acute urinary retention          New Prescriptions    No medications on file         Domenic Schwab, MD  02/08/19 1931

## 2019-02-08 NOTE — ED Notes (Signed)
Bladder scan results 

## 2019-02-10 ENCOUNTER — Telehealth (RURAL_HEALTH_CENTER): Payer: Self-pay

## 2019-02-10 NOTE — Telephone Encounter (Signed)
Dr. Dale......Please advise....pkt

## 2019-02-10 NOTE — Telephone Encounter (Signed)
Patient was in the ED over the weekend and was told to call Dr. Edythe Clarity office on Monday in regards to having her catheter taken out.  She does have an appointment on Thursday.with Dr. Amada Jupiter. Can this wait or should she come in and see someone else for this before Thursday?

## 2019-02-11 ENCOUNTER — Emergency Department
Admission: EM | Admit: 2019-02-11 | Discharge: 2019-02-11 | Disposition: A | Discharge status: Home / Self Care | Payer: Medicare Other | Attending: Emergency Medicine | Admitting: Emergency Medicine

## 2019-02-11 DIAGNOSIS — Y846 Urinary catheterization as the cause of abnormal reaction of the patient, or of later complication, without mention of misadventure at the time of the procedure: Secondary | ICD-10-CM | POA: Insufficient documentation

## 2019-02-11 DIAGNOSIS — N390 Urinary tract infection, site not specified: Secondary | ICD-10-CM | POA: Insufficient documentation

## 2019-02-11 DIAGNOSIS — B9620 Unspecified Escherichia coli [E. coli] as the cause of diseases classified elsewhere: Secondary | ICD-10-CM | POA: Insufficient documentation

## 2019-02-11 DIAGNOSIS — T83511A Infection and inflammatory reaction due to indwelling urethral catheter, initial encounter: Secondary | ICD-10-CM | POA: Insufficient documentation

## 2019-02-11 DIAGNOSIS — I10 Essential (primary) hypertension: Secondary | ICD-10-CM | POA: Insufficient documentation

## 2019-02-11 LAB — VH URINALYSIS WITH MICROSCOPIC AND CULTURE IF INDICATED
Bilirubin, UA: NEGATIVE mg/dL
Glucose, UA: NEGATIVE mg/dL
Ketones UA: NEGATIVE mg/dL
Nitrite, UA: NEGATIVE
Urine Specific Gravity: 1.01 (ref 1.001–1.040)
Urobilinogen, UA: 2 mg/dL — AB
pH, Urine: 6 pH (ref 5.0–8.0)

## 2019-02-11 MED ORDER — CEPHALEXIN 500 MG PO CAPS
500.00 mg | ORAL_CAPSULE | Freq: Once | ORAL | Status: AC
Start: 2019-02-11 — End: 2019-02-11
  Administered 2019-02-11: 19:00:00 500 mg via ORAL

## 2019-02-11 MED ORDER — CEPHALEXIN 500 MG PO CAPS
1.00 | ORAL_CAPSULE | Freq: Once | ORAL | Status: AC
Start: 2019-02-12 — End: 2019-02-11
  Administered 2019-02-11: 19:00:00 500 mg

## 2019-02-11 MED ORDER — CEPHALEXIN 500 MG PO CAPS
ORAL_CAPSULE | ORAL | Status: AC
Start: 2019-02-11 — End: ?
  Filled 2019-02-11: qty 2

## 2019-02-11 MED ORDER — CEPHALEXIN 500 MG PO CAPS
500.00 mg | ORAL_CAPSULE | Freq: Two times a day (BID) | ORAL | 0 refills | Status: AC
Start: 2019-02-11 — End: 2019-02-21

## 2019-02-11 NOTE — ED Notes (Signed)
Urine draining well from catheter.

## 2019-02-11 NOTE — ED Provider Notes (Signed)
[facility] EMERGENCY DEPARTMENT  [place], [place]       Voice recognition software was used to generate this report. Every attempt was made at correction, however, grammatical and transcription errors may occur.     Physician/Midlevel provider first contact with patient: 02/11/19 1755         FINAL DIAGNOSIS:  1. Urinary tract infection associated with indwelling urethral catheter, initial encounter           ASSESSMENT/DIFFERENTIAL DIAGNOSIS:  [name] is a 78 y.o. female with leakage about her Foley catheter, likely due to underinflated distal balloon.  She may be developing an early urinary tract infection as well.      The differential diagnoses that were considered in the evaluation of this patient include, but were not limited to, catheter dysfunction, urinary tract infection, among others. -     ED COURSE & MEDICAL DECISION MAKING:    She had less than 20 mL of urine retained.  The catheter was manipulated in the distal balloon was reinflated with greater volume.  She had no further leakage.  She did not have pain with manipulation of the catheter.  We discussed her results and she has a follow-up appointment in 2 days where discussion will be had about when to remove the catheter.  Cephalexin by mouth with an additional tablet to take in the morning until she can fill her prescription. -      Visit Vitals  BP 139/82   Temp 98.5 F (36.9 C) (Temporal)   Resp 22   Wt 78.2 kg   SpO2 96%   BMI 28.67 kg/m         Patient's evaluation indicates the patient does not appear to have a need for inpatient workup and/or management. No further testing indicated acutely. Prior visits/ charts: reviewed. Nurses' notes reviewed. Pertinent lab and radiology data was reviewed by me and discussed with patient and family, if present. I had a detailed discussion about the diagnosis, workup and treatment plan, and we are in agreement.    MEDICATIONS ORDERED THIS VISIT:     Medications   cephalexin  (KEFLEX) capsule 500 mg (500 mg Oral Given 02/11/19 1854)   cephalexin (KEFLEX) capsule 500 mg (Take Home Medication) (500 mg Other Meds to Go - ED Use Only 02/11/19 1854)   cephalexin (KEFLEX) 500 MG capsule (has no administration in time range)       PROCEDURES:   Procedures    CONSULTATION:   None     DISPOSITION:    Discharge from the Emergency Department.      CONDITION AT DISPOSITION: Good, unchanged. Stable.               HISTORIAN:  History obtained from the patient.    CHIEF COMPLAINT:  Chief Complaint   Patient presents with   . Urinary Incontinence   . Leg Swelling    " I do not know what's wrong.  My catheter is leaking." according to patient    HISTORY OF PRESENT ILLNESS:    [name] is a 78 y.o. female who complains of leaking around her Foley catheter, saturating clothing.  She notes pain and pressure in the bladder area.  She is short of breath with exertion, but better than when she was in the hospital.  No chest or abdominal pain.  No fever.  She has a painful spot on her right lower leg where she has traumatized it multiple times.  She had  a recent hospitalization with significant fluid overload and has done better since that.   HPI -   .  REVIEW OF SYSTEMS:   Review of Systems. At least 10 systems reviewed and negative, unless otherwise specified above.    PAST MEDICAL/SURGICAL HISTORY:  Past Medical History:   Diagnosis Date   . Aortic aneurysm    . Atrial fibrillation    . Chronic obstructive pulmonary disease    . Coronary artery disease    . Gastroesophageal reflux disease    . Hypertension    . Malignant neoplasm    . Pacemaker    . SSS (sick sinus syndrome)    .   Past Surgical History:   Procedure Laterality Date   . CARDIAC PACEMAKER PLACEMENT      2002   . HYSTERECTOMY     .     IMMUNIZATIONS:  Immunization History   Administered Date(s) Administered   . INFLUENZA HIGH DOSE 65 YRS+ 01/29/2017       FAMILY HISTORY:  Family History   Problem Relation Age of Onset   . Breast  cancer Sister        HOME MEDICATIONS:  Home Medications             albuterol (PROVENTIL) (2.5 MG/3ML) 0.083% nebulizer solution     Take 2.5 mg by nebulization every 6 (six) hours as needed for Wheezing.     benzonatate (TESSALON) 200 MG capsule     Take 200 mg by mouth 3 (three) times daily as needed.     diltiazem (TIAZAC) 240 MG 24 hr capsule     Take 240 mg by mouth 2 (two) times daily.        esomeprazole (NEXIUM) 40 MG capsule     TAKE 1 CAPSULE BY MOUTH EVERY DAY-STOP OMEPRAZOLE     estradiol (ESTRACE) 0.1 MG/GM vaginal cream     Place 2 g vaginally daily.     fluorometholone (EFLONE) 0.1 % ophthalmic suspension     Place 1 drop into the left eye 4 (four) times daily        fluticasone furoate-vilanterol (BREO ELLIPTA) 100-25 MCG/INH Aerosol Pwdr, Breath Activated     Inhale 1 puff into the lungs daily     furosemide (LASIX) 40 MG tablet     TAKE 1 TABLET BY MOUTH TWO TIMES DAILY ON AN EMPTY STOMACH     ketoconazole (NIZORAL) 2 % cream     Apply topically daily.     latanoprost (XALATAN) 0.005 % ophthalmic solution     Place 1 drop into both eyes nightly        potassium chloride (K-DUR) 10 MEQ tablet     Take 1 tablet (10 mEq total) by mouth 2 (two) times daily     rosuvastatin (CRESTOR) 10 MG tablet     Take 10 mg by mouth daily.     tiotropium (SPIRIVA RESPIMAT) 2.5 MCG/ACT inhalation spray     Inhale 2 puffs into the lungs daily     warfarin (COUMADIN) 3 MG tablet     Take 1 tablet (3 mg total) by mouth daily.     Patient taking differently: Take 2.5 mg by mouth daily 5mg  Wed and Thurs, and 2.5mg  the other days of the week.             ALLERGY:  Allergies   Allergen Reactions   . Adhesive [Wound Dressing Adhesive]    . Codeine    . Morphine    .  Nitroglycerin    . Pain Patch [Menthol]        SOCIAL HISTORY:   Social History     Tobacco Use   . Smoking status: Never Smoker   . Smokeless tobacco: Never Used   Substance Use Topics   . Alcohol use: No   . Drug use: No       NOTE: If the PMH or PSH or social  history sections read "Not on file" or are left blank it reflects that the histories were verbally investigated but there is no past medical or surgical history, or tobacco, alcohol or recreational drug use except as noted.    PHYSICAL EXAMINATION:  VITAL SIGNS reviewed by me:   Vitals:    02/11/19 1817   BP: 139/82   Resp:    Temp:    SpO2: 96%      Reviewed by the Emergency Physician  Pulse Ox Interpretation by Emergency Physician: normal   WEIGHT:   Weight Monitoring 01/30/2019 02/08/2019 02/11/2019   Height - 165.1 cm -   Height Method - Stated -   Weight 77.4 kg 80.9 kg 78.155 kg   Weight Method Standing Scale Bed Scale Bed Scale   BMI (calculated) - 29.7 kg/m2 -       CONSTITUTIONAL: Vital signs reviewed. Patient is chronically unwell-appearing. Patient appears uncomfortable. Hydration: normal. Not toxic appearing.   HEAD: Atraumatic. Normocephalic.   EYES: Eyes are normal to inspection. Pupils equal, round, reactive to light. Discharge from eyes: none. Extraocular muscles: intact.   ENT: No facial swelling. External ears normal. No discharge from ears.   NECK: Range of motion: normal. Tenderness: none. Trachea midline. Jugular venous distention: none.   CARDIOVASCULAR: Rate: normal. Rhythm: Irregularly irregular. Murmurs: none. Normal S1 S2.   RESPIRATORY/CHEST: Chest tenderness: none. Breath sounds: normal, mildly diminished in the left base. Respiratory distress: none. Accessory muscle use: none. Work of breathing: normal.   BACK: Tenderness: none. Range of motion: normal for age. Visible abnormality: none. Palpable abnormality: none.   ABDOMEN: Soft. Distention: none. Tenderness: none. No guarding. No rebound. Bowel sounds: normal.   RECTAL: Not performed.   GENITALIA: Normal external female genitalia with Foley catheter in place.   UPPER EXTREMITY: Inspection normal. No edema. No cyanosis. No clubbing. Normal range of motion. Open wound: none.   LOWER EXTREMITY: Inspection normal.  Bilateral 3+ pitting  edema of the lower legs, feet and ankles.  Loss of secondary skin structures.  Multiple scars.  Red-crusted papule on the left anteromedial lower leg without fluctuance, warmth, erythema.  Legs are diffusely mildly tender. No cyanosis. No clubbing. Normal range of motion. No calf tenderness. Open wound: none.   NEUROLOGIC:  Awake. Alert. Strength: normal. Sensation: normal. Cranial nerves: intact. Conversation: fluent. NIHSS not applicable  SKIN: Skin is normal color, warm, and dry. Skin intact. Rash: None.   LYMPHATIC: No pathologic adenopathy. No lymphangitis.   PSYCHIATRIC: Affect: anxious. Insight: normal.       Physical Exam    DATA REVIEWED:   LABS:      Results     Procedure Component Value Units Date/Time    Urinalysis w Microscopic and Culture if Indicated [[id number]]  (Abnormal) Collected: 02/11/19 1815    Specimen: Urine, Random Updated: 02/11/19 1829     Color, UA Yellow     Clarity, UA Slightly Cloudy     Urine Specific Gravity 1.010     pH, Urine 6.0 pH      Protein, UR Trace mg/dL  Glucose, UA Negative mg/dL      Ketones UA Negative mg/dL      Bilirubin, UA Negative mg/dL      Blood, UA 3+ mg/dL      Nitrite, UA Negative     Urobilinogen, UA 2.0 mg/dL      Leukocyte Esterase, UA 2+ Leu/uL      UR Micro Performed     WBC, UA 10-15 /hpf      RBC, UA 15-20 /hpf      Bacteria, UA Moderate /hpf      Squam Epithel, UA 5-10 /lpf     Narrative:      A Urine Culture has been ordered based upon the Positive UA results.             Reviewed and interpreted by the Emergency Physician       Vida Rigger, MD  02/11/19 2015

## 2019-02-11 NOTE — ED Notes (Signed)
Pt had 7ml in catheter balloon. Removed , placed in balloon. Pt feels comfortable, denies any pain after added volume. UA collected and sent to the lab.

## 2019-02-11 NOTE — ED Notes (Signed)
Pt discharged with verbal and written instructions given. Prescription sent electronically to pharmacy. Pt verbalized understanding of all instructions. Pt aa&ox4 and in nad at time of discharge.

## 2019-02-11 NOTE — ED Triage Notes (Signed)
Pt here with bilat lower leg edema and pain. She also has a urinary catheter, placed on Saturday. She c/o urine leaking from her vaginal area as well as into the bag, also noticed some blood clots in her tubing. Pt on 4L oxygen via [place].

## 2019-02-13 ENCOUNTER — Ambulatory Visit: Payer: Medicare Other

## 2019-02-13 ENCOUNTER — Encounter (RURAL_HEALTH_CENTER): Payer: Self-pay | Admitting: Internal Medicine

## 2019-02-13 ENCOUNTER — Ambulatory Visit: Payer: Medicare Other | Attending: Internal Medicine | Admitting: Internal Medicine

## 2019-02-13 VITALS — BP 129/59 | HR 66 | Temp 98.6°F | Ht 65.0 in | Wt 171.6 lb

## 2019-02-13 DIAGNOSIS — J4541 Moderate persistent asthma with (acute) exacerbation: Secondary | ICD-10-CM

## 2019-02-13 DIAGNOSIS — I872 Venous insufficiency (chronic) (peripheral): Secondary | ICD-10-CM

## 2019-02-13 DIAGNOSIS — I5033 Acute on chronic diastolic (congestive) heart failure: Secondary | ICD-10-CM

## 2019-02-13 DIAGNOSIS — I4821 Permanent atrial fibrillation: Secondary | ICD-10-CM

## 2019-02-13 DIAGNOSIS — I272 Pulmonary hypertension, unspecified: Secondary | ICD-10-CM

## 2019-02-13 DIAGNOSIS — R600 Localized edema: Secondary | ICD-10-CM

## 2019-02-13 LAB — POCT PROTHROMBIN TIME (PT): INR POCT: 2 (ref 2–3)

## 2019-02-13 MED ORDER — ZOLPIDEM TARTRATE 5 MG PO TABS
5.0000 mg | ORAL_TABLET | Freq: Every evening | ORAL | 1 refills | Status: DC | PRN
Start: 2019-02-13 — End: 2019-03-01

## 2019-02-13 MED ORDER — HYDROCODONE-ACETAMINOPHEN 5-325 MG PO TABS
1.0000 | ORAL_TABLET | Freq: Four times a day (QID) | ORAL | 0 refills | Status: DC | PRN
Start: 2019-02-13 — End: 2019-03-01

## 2019-02-13 NOTE — Progress Notes (Signed)
Anticoagulation Progress Report   February 13, 2019  Name: [name], [name] [facility]    Medical Record Number: [id number]  Reason for Treatment: Atrial fibrillation unspecified     INR Range: 2.0-3.0    Date: 2019-02-13 07:26  Location: [facility]    INR: 2.00    Tablet Strength: 5 mg    Next INR: 7 days 2019-02-20    Update Note:     Dosage Out: :Su-2.5mg :M-5mg :T-2.5mg :W-2.5mg :Th-7.5mg :F-2.5mg :Sa-2.5mg :

## 2019-02-13 NOTE — Progress Notes (Signed)
Anticoagulation Progress Report   February 13, 2019  Name: [name], [name] [facility]    Medical Record Number: [id number]  Reason for Treatment: Atrial fibrillation unspecified     INR Range: 2.0-3.0    Date: 2019-02-13 07:42  Location: [facility]    Update Note: Addendum from 02/13/19: Patient has agreed to 3 servings of   vitamin K for the week. Retest in 1 week.     Submitted by Kennyth Arnold RN

## 2019-02-13 NOTE — Progress Notes (Signed)
Acute on chronic hypoxic respiratory failure  Acute on chronic diastolic congestive heart failure  Acute exacerbation of chronic asthma  Home oxygen dependency  Secondary pulmonary hypertension  Chronic atrial fibrillation (has pacemaker)     Hospital Course       [name] is a 78 y.o. female patient that was admitted on 01/24/2019 she presented to the emergency room with difficulty breathing.  Chest x-ray on admission showed marketed cardiac enlargement.  There were no pleural effusions or peripheral edema.  It was felt by the ED physician and the admitting hospitalist via telemedicine that the patient had asthma.  On admission emphasis was placed on COPD/asthma and she was receiving IV steroids and nebulizers when I saw her on the second hospital day.  Based on the fact that the heart was markedly enlarged and that she was in atrial fibrillation I felt that this shortness of breath on admission was likely caused by pulmonary edema from heart failure.  I had seen her in 2018 in similar circumstances.  We started her on IV Lasix with a big urine output and subsequent improvement by the next day.  The patient had one good day and then began getting short of breath again and this time she was wheezing.  Now I felt she had asthma.  We started her on IV Solu-Medrol and placed her on nebulizers again using albuterol.  I started her on tiotropium Respimat and Breo Ellipta and she improved in a couple of days.  She did have 1 day where she had headache and nausea.  That day I held her Lasix and restarted the Lasix 2 days later.  She is now going home on Lasix and these inhalers and I think we should be good.  I also gave her a prescription for hydrocodone which helps her back.  She has had a series of recent steroid injections in her back in an effort to get her off opioids and they have not worked.  I also took this opportunity to stop Sinemet which have been started a couple of years ago for tremor.  This  patient does not have Parkinson's and the Sinemet did not seem to help the tremor so I stopped it.  At the time of transfer she is feeling really good.  She is getting up and walking in the hallway on her supplemental oxygen.  She is able to return home.  She did ask me if I would consider taking her in my practice since she has to travel long distance to see her doctor in [place] and I told her that if she wanted to come see me I would accept her.      ED visit on 10/10:   The patient had almost 400 cc in her bladder.  She is on hydrocodone but I do not see an acute other reason for her to have urinary retention.  No new medications.  No evidence of transverse myelitis.  Patient had blood work done here that did not show any acute findings.  I think it is reasonable to let her go home with a catheter.  The issue is that I am concerned that she may have to come right back.  She actually subjectively feels better following having her bladder drained.      ED 10/13:  She had less than 20 mL of urine retained.  The catheter was manipulated in the distal balloon was reinflated with greater volume.  She had no further leakage.  She did not have pain with manipulation of the catheter.  We discussed her results and she has a follow-up appointment in 2 days where discussion will be had about when to remove the catheter.  Cephalexin by mouth with an additional tablet to take in the morning until she can fill her prescription. -    E coli growing now. Sensi to follow.      Active Ambulatory Problems     Diagnosis Date Noted   . CHF (congestive heart failure) 11/05/2013   . Permanent atrial fibrillation 11/06/2013   . Hyperkalemia 08/24/2014   . Acute on chronic diastolic CHF (congestive heart failure), NYHA class 3 08/25/2014   . On home oxygen therapy 08/25/2014   . Aortic regurgitation 08/25/2014   . Pacemaker 08/25/2014   . Pacemaker at end of battery life 03/22/2015   . Acute exacerbation of CHF (congestive heart  failure) 07/16/2015   . 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 Chronic interstitial lung disease 07/19/2015   . Thoracic aortic aneurysm without rupture 07/19/2015   . Difficulty breathing 04/25/2017   . Chronic respiratory failure with hypoxia, on home O2 therapy 04/26/2017   . Nonobstructive atherosclerosis of coronary artery 04/23/2018   . SSS (sick sinus syndrome) 04/25/2018   . Acute on chronic diastolic (congestive) heart failure 01/27/2019   . Moderate asthma with acute exacerbation 01/28/2019   . Pulmonary HTN 01/30/2019   . Chronic venous insufficiency 02/13/2019   . Bilateral lower extremity edema 02/13/2019     Resolved Ambulatory Problems     Diagnosis Date Noted   . Urinary tract infection with hematuria, site unspecified    . Nausea 01/27/2019   . Headache 01/27/2019   . Constipation 01/27/2019     Past Medical History:   Diagnosis Date   . Aortic aneurysm    . Atrial fibrillation    . Chronic obstructive pulmonary disease    . Coronary artery disease    . Gastroesophageal reflux disease    . Hypertension    . Malignant neoplasm      Current/Home Medications    ALBUTEROL (PROVENTIL) (2.5 MG/3ML) 0.083% NEBULIZER SOLUTION    Take 2.5 mg by nebulization every 6 (six) hours as needed for Wheezing.    BENZONATATE (TESSALON) 200 MG CAPSULE    Take 200 mg by mouth 3 (three) times daily as needed.    CARBAMAZEPINE (TEGRETOL  XR) 100 MG 12 HR TABLET    TAKE 1 TABLET BY MOUTH ONCE DAILY/MOOD DISORDER    CARBIDOPA-LEVODOPA (SINEMET) 25-250 MG PER TABLET    TAKE 1 TABLET BY MOUTH 2 TIMES A DAY/ROR TREMORS    CEPHALEXIN (KEFLEX) 500 MG CAPSULE    Take 1 capsule (500 mg total) by mouth 2 (two) times daily for 10 days    DILTIAZEM (TIAZAC) 240 MG 24 HR CAPSULE    Take 240 mg by mouth 2 (two) times daily.       ESOMEPRAZOLE (NEXIUM) 40 MG CAPSULE    TAKE 1 CAPSULE BY MOUTH EVERY DAY-STOP OMEPRAZOLE    ESTRADIOL (ESTRACE) 0.1 MG/GM VAGINAL CREAM    Place 2 g vaginally daily.    FLUOROMETHOLONE (EFLONE) 0.1 %  OPHTHALMIC SUSPENSION    Place 1 drop into the left eye 4 (four) times daily       FLUTICASONE FUROATE-VILANTEROL (BREO ELLIPTA) 100-25 MCG/INH AEROSOL PWDR, BREATH ACTIVATED    Inhale 1 puff into the lungs daily    FUROSEMIDE (LASIX) 40 MG TABLET    TAKE  1 TABLET BY MOUTH TWO TIMES DAILY ON AN EMPTY STOMACH    KETOCONAZOLE (NIZORAL) 2 % CREAM    Apply topically daily.    LATANOPROST (XALATAN) 0.005 % OPHTHALMIC SOLUTION    Place 1 drop into both eyes nightly       POTASSIUM CHLORIDE (K-DUR) 10 MEQ TABLET    Take 1 tablet (10 mEq total) by mouth 2 (two) times daily    ROSUVASTATIN (CRESTOR) 10 MG TABLET    Take 10 mg by mouth daily.    TIOTROPIUM (SPIRIVA RESPIMAT) 2.5 MCG/ACT INHALATION SPRAY    Inhale 2 puffs into the lungs daily    WARFARIN (COUMADIN) 3 MG TABLET    Take 1 tablet (3 mg total) by mouth daily.     Review of Systems   Constitutional: Positive for malaise/fatigue. Negative for chills and fever.   HENT: Negative.    Eyes: Negative.    Respiratory: Negative for cough, shortness of breath and wheezing.         Has both CHF and asthma.   Cardiovascular: Positive for leg swelling.   Genitourinary:        Trouble voiding with a need for Foley catheter.  Foley catheter removed in the office today.  Prior to the Foley catheter the patient was unable to void.  The catheter was inserted on 10/10.  She went back to the ED on 10/13 and was started on antibiotic.  More water was put in the Foley bulb.  I remove the Foley today.   Musculoskeletal: Positive for back pain.        Has had a series of shots for her back.  They did not work.  She has spinal stenosis.  We are going to continue on hydrocodone which seems to be working.   Skin:        Has hemosiderin pigmentation of both legs below the knees from venous insufficiency   Neurological: Negative for dizziness, focal weakness and headaches.   Psychiatric/Behavioral: Negative for depression and memory loss. The patient is not nervous/anxious.      Vitals:     02/13/19 1427   BP: 129/59   Pulse: 66   Temp: 98.6 F (37 C)   SpO2: 97%     The lady in no distress sitting in a wheelchair.  Lungs are clear with no wheezing  Cardiac rhythm irregular consistent with atrial fibrillation.  Rate is controlled.  S1-S2 are heard.  No murmur or gallop  Abdomen soft and nontender  Indwelling Foley noted in the bladder  Extremities with 2-3+ edema in both legs with deep hemosiderin pigment and a small 1.5 cm in diameter ulcer on the medial aspect of the left shin.  The feet are also swollen.    Diagnoses: Permanent atrial fibrillation/acute on chronic diastolic heart failure, resolved/chronic venous insufficiency/bilateral lower extremity edema/urinary retention/Foley catheter removed/pulmonary hypertension/chronic asthma, controlled/pacemaker    Plan: She is got a couple of things.  There is more swelling in her legs when she was in the hospital.  She also has an indwelling Foley.  I have taking out the catheter.  I think she is good to be able to void on her own.  I told her to continue the antibiotic.  I looked up the culture done by Dr. Maxie Better the other night and she is growing E. coli.  I do not have the sensitivities but Keflex should probably work.  I have renewed her zolpidem 5 mg and hydrocodone 5 mg as  well.  I will see her again in 2 weeks.  Lab work will be done then.  She is in warfarin clinic right now and her INR was 2.0 today.    Electronically signed by Graciela Husbands D.O.

## 2019-02-17 ENCOUNTER — Telehealth (RURAL_HEALTH_CENTER): Payer: Self-pay

## 2019-02-17 NOTE — Telephone Encounter (Signed)
Spoke with patient over the telephone as part of his transition care management following her recent hospitalization for    Acute on chronic hypoxic respiratory failure  Acute on chronic diastolic congestive heart failure  Acute exacerbation of chronic asthma  Home oxygen dependency  Secondary pulmonary hypertension  Chronic atrial fibrillation (has pacemaker) .      Pt reports that she is feeling better but continues to have swelling in bilateral lower extremities. She reports that she weighs herself daily and is up by 3 lbs. Since Friday. Reports that she sits with her legs elevated as much as possible. Urinating without difficulty since removal of foley cath.     Pt states she wears oxygen at 2 L with the exception of becoming short of breath, at which time she states that she turns it up to 2.5 L to 3 L if necessary and returns it to the 2 L as soon as the SOB subsides.     Pt states appointment with Dr Amada Jupiter on 02/27/19. Pt states she is taking all of his medications as ordered and plans to do everything that Dr. Amada Jupiter tells her to do because she wants to be compliant. Encouraged patient to call or return to the ER for any worsening SOB, cough, and fevers.    ASSESSMENT/PLAN:    1) Increased swelling bilateral lower extremities  2) Weight gain of 3 lbs. Since Friday.   3) 02 @ 2 L continuous as ordered by PCP  4) Encouraged patient to call or return to the ER for any worsening SOB, cough, and fevers.  5) Will plan to follow up with patient again next week Monday, October 26th

## 2019-02-20 ENCOUNTER — Ambulatory Visit: Payer: Medicare Other

## 2019-02-20 DIAGNOSIS — I4821 Permanent atrial fibrillation: Secondary | ICD-10-CM

## 2019-02-20 LAB — POCT PROTHROMBIN TIME (PT): INR POCT: 1.5 — AB (ref 2–3)

## 2019-02-20 NOTE — Progress Notes (Signed)
Anticoagulation Progress Report   February 20, 2019  Name: [name], [name] [facility]    Medical Record Number: [id number]  Reason for Treatment: Atrial fibrillation unspecified     INR Range: 2.0-3.0    Date: 2019-02-20 13:33  Location: [facility]    INR: 1.50    Tablet Strength: 5 mg    Next INR: 7 days 2019-02-27    Update Note:     Progress Note: Patient denies missing any Coumadin doses, she states she   had no vitamin k this past week except iceberg lettuce. She states there   were no medication changes. I will increase tonight's dose of Coumadin and   retest in 1 week.     Dosage Out: :Su-2.5mg :M-5mg :T-2.5mg :W-2.5mg :Th-10mg :F-2.5mg :Sa-2.5mg :

## 2019-02-26 ENCOUNTER — Telehealth (RURAL_HEALTH_CENTER): Payer: Self-pay

## 2019-02-26 NOTE — Telephone Encounter (Signed)
Called patient for her 2nd transition care management weekly call.     Patient states that she doesn't feel well today. When prompted to give me more details, pt. States that she really can't talk about it. I asked if she was in any danger or if she needed immediate help. She states "no", but you know that some people are bullies. Again, I Asked if she wanted to discuss this issue further, she tells me "no".     Patient tells me that she is having issues with heart palpitations that she reports is causing her to wear down, also reports swelling in  bilateral lower extremities, reports that she is keeping her legs elevated. She tells me that she is drinking several large containers of water throughout the day and takes one to bed with her a night. Encouraged her to cut back on water intake to see if it helps. Denies shortness of breath, no chest pain or fever. Able to speak in complete sentences during our conversation.    Patient states that she is glad that she has an appointment with Dr. Amada Jupiter on Thursday. Reminded patient to talk to Dr. Amada Jupiter about her concerns, states understanding.     Reminded patient that I will call her back on Friday and next week to check on her.

## 2019-02-26 NOTE — Telephone Encounter (Signed)
Thanks, Aggie Cosier

## 2019-02-27 ENCOUNTER — Inpatient Hospital Stay
Admission: RE | Admit: 2019-02-27 | Discharge: 2019-03-01 | DRG: 292 | Disposition: A | Discharge status: Home / Self Care | Payer: Medicare Other | Source: Ambulatory Visit | Attending: Internal Medicine | Admitting: Internal Medicine

## 2019-02-27 ENCOUNTER — Inpatient Hospital Stay: Payer: Medicare Other

## 2019-02-27 ENCOUNTER — Ambulatory Visit: Payer: Medicare Other | Attending: Internal Medicine | Admitting: Internal Medicine

## 2019-02-27 ENCOUNTER — Ambulatory Visit: Payer: Medicare Other

## 2019-02-27 ENCOUNTER — Encounter (RURAL_HEALTH_CENTER): Payer: Self-pay | Admitting: Internal Medicine

## 2019-02-27 VITALS — BP 143/59 | HR 88 | Temp 98.8°F | Ht 65.0 in | Wt 174.8 lb

## 2019-02-27 DIAGNOSIS — H409 Unspecified glaucoma: Secondary | ICD-10-CM | POA: Diagnosis present

## 2019-02-27 DIAGNOSIS — I2510 Atherosclerotic heart disease of native coronary artery without angina pectoris: Secondary | ICD-10-CM | POA: Diagnosis present

## 2019-02-27 DIAGNOSIS — J449 Chronic obstructive pulmonary disease, unspecified: Secondary | ICD-10-CM | POA: Diagnosis present

## 2019-02-27 DIAGNOSIS — I5043 Acute on chronic combined systolic (congestive) and diastolic (congestive) heart failure: Secondary | ICD-10-CM | POA: Diagnosis present

## 2019-02-27 DIAGNOSIS — H9190 Unspecified hearing loss, unspecified ear: Secondary | ICD-10-CM | POA: Diagnosis present

## 2019-02-27 DIAGNOSIS — I5082 Biventricular heart failure: Secondary | ICD-10-CM | POA: Diagnosis present

## 2019-02-27 DIAGNOSIS — Z87440 Personal history of urinary (tract) infections: Secondary | ICD-10-CM

## 2019-02-27 DIAGNOSIS — I110 Hypertensive heart disease with heart failure: Principal | ICD-10-CM | POA: Diagnosis present

## 2019-02-27 DIAGNOSIS — Z7901 Long term (current) use of anticoagulants: Secondary | ICD-10-CM

## 2019-02-27 DIAGNOSIS — M109 Gout, unspecified: Secondary | ICD-10-CM | POA: Diagnosis present

## 2019-02-27 DIAGNOSIS — Z9981 Dependence on supplemental oxygen: Secondary | ICD-10-CM

## 2019-02-27 DIAGNOSIS — I5033 Acute on chronic diastolic (congestive) heart failure: Secondary | ICD-10-CM

## 2019-02-27 DIAGNOSIS — I495 Sick sinus syndrome: Secondary | ICD-10-CM | POA: Diagnosis present

## 2019-02-27 DIAGNOSIS — J45901 Unspecified asthma with (acute) exacerbation: Secondary | ICD-10-CM | POA: Diagnosis present

## 2019-02-27 DIAGNOSIS — I872 Venous insufficiency (chronic) (peripheral): Secondary | ICD-10-CM | POA: Diagnosis present

## 2019-02-27 DIAGNOSIS — I351 Nonrheumatic aortic (valve) insufficiency: Secondary | ICD-10-CM | POA: Diagnosis present

## 2019-02-27 DIAGNOSIS — I4821 Permanent atrial fibrillation: Secondary | ICD-10-CM

## 2019-02-27 DIAGNOSIS — Z66 Do not resuscitate: Secondary | ICD-10-CM | POA: Diagnosis present

## 2019-02-27 DIAGNOSIS — Z79899 Other long term (current) drug therapy: Secondary | ICD-10-CM

## 2019-02-27 DIAGNOSIS — J9611 Chronic respiratory failure with hypoxia: Secondary | ICD-10-CM | POA: Diagnosis present

## 2019-02-27 DIAGNOSIS — I083 Combined rheumatic disorders of mitral, aortic and tricuspid valves: Secondary | ICD-10-CM | POA: Diagnosis present

## 2019-02-27 DIAGNOSIS — I5023 Acute on chronic systolic (congestive) heart failure: Secondary | ICD-10-CM

## 2019-02-27 DIAGNOSIS — G8929 Other chronic pain: Secondary | ICD-10-CM | POA: Diagnosis present

## 2019-02-27 DIAGNOSIS — M549 Dorsalgia, unspecified: Secondary | ICD-10-CM | POA: Diagnosis present

## 2019-02-27 DIAGNOSIS — I272 Pulmonary hypertension, unspecified: Secondary | ICD-10-CM | POA: Diagnosis present

## 2019-02-27 LAB — B-TYPE NATRIURETIC PEPTIDE: B-Natriuretic Peptide: 216.2 pg/mL — ABNORMAL HIGH (ref 0.0–100.0)

## 2019-02-27 LAB — POCT PROTHROMBIN TIME (PT): INR POCT: 2.8 (ref 2–3)

## 2019-02-27 LAB — BASIC METABOLIC PANEL
Anion Gap: 17.6 mMol/L (ref 7.0–18.0)
BUN / Creatinine Ratio: 20.4 Ratio (ref 10.0–30.0)
BUN: 19 mg/dL (ref 7–22)
CO2: 29 mMol/L (ref 20.0–30.0)
Calcium: 10.6 mg/dL — ABNORMAL HIGH (ref 8.5–10.5)
Chloride: 93 mMol/L — ABNORMAL LOW (ref 98–110)
Creatinine: 0.93 mg/dL (ref 0.60–1.20)
EGFR: 58 mL/min/{1.73_m2} — ABNORMAL LOW (ref 60–150)
Glucose: 101 mg/dL — ABNORMAL HIGH (ref 71–99)
Osmolality Calculated: 274 mOsm/kg — ABNORMAL LOW (ref 275–300)
Potassium: 3.6 mMol/L (ref 3.5–5.3)
Sodium: 136 mMol/L (ref 136–147)

## 2019-02-27 LAB — MAGNESIUM: Magnesium: 1.7 mg/dL (ref 1.6–2.6)

## 2019-02-27 MED ORDER — METOLAZONE 5 MG PO TABS
10.0000 mg | ORAL_TABLET | Freq: Every day | ORAL | Status: DC
Start: 2019-02-27 — End: 2019-02-28
  Administered 2019-02-27 – 2019-02-28 (×2): 10 mg via ORAL
  Filled 2019-02-27 (×2): qty 2

## 2019-02-27 MED ORDER — BENZONATATE 100 MG PO CAPS
200.00 mg | ORAL_CAPSULE | Freq: Three times a day (TID) | ORAL | Status: DC | PRN
Start: 2019-02-27 — End: 2019-03-01

## 2019-02-27 MED ORDER — FUROSEMIDE 10 MG/ML IJ SOLN
60.00 mg | Freq: Four times a day (QID) | INTRAMUSCULAR | Status: DC
Start: 2019-02-27 — End: 2019-02-28
  Administered 2019-02-27 – 2019-02-28 (×4): 60 mg via INTRAVENOUS
  Filled 2019-02-27 (×5): qty 8

## 2019-02-27 MED ORDER — SODIUM CHLORIDE (PF) 0.9 % IJ SOLN
0.40 mg | INTRAMUSCULAR | Status: DC | PRN
Start: 2019-02-27 — End: 2019-03-01

## 2019-02-27 MED ORDER — TIOTROPIUM BROMIDE MONOHYDRATE 2.5 MCG/ACT IN AERS
2.00 | INHALATION_SPRAY | Freq: Every morning | RESPIRATORY_TRACT | Status: DC
Start: 2019-02-27 — End: 2019-02-27

## 2019-02-27 MED ORDER — FLUOROMETHOLONE 0.1 % OP SUSP
1.00 [drp] | Freq: Every evening | OPHTHALMIC | Status: DC
Start: 2019-02-27 — End: 2019-03-01
  Administered 2019-02-27 – 2019-02-28 (×2): 1 [drp] via OPHTHALMIC
  Filled 2019-02-27: qty 5

## 2019-02-27 MED ORDER — WARFARIN SODIUM 3 MG PO TABS
3.0000 mg | ORAL_TABLET | Freq: Every day | ORAL | Status: DC
Start: 2019-02-27 — End: 2019-03-01
  Administered 2019-02-27 – 2019-02-28 (×2): 3 mg via ORAL
  Filled 2019-02-27 (×2): qty 1

## 2019-02-27 MED ORDER — TIOTROPIUM BROMIDE MONOHYDRATE 2.5 MCG/ACT IN AERS
2.00 | INHALATION_SPRAY | Freq: Every morning | RESPIRATORY_TRACT | Status: DC
Start: 2019-02-28 — End: 2019-03-01
  Administered 2019-02-28 – 2019-03-01 (×2): 2 via RESPIRATORY_TRACT
  Filled 2019-02-27: qty 1

## 2019-02-27 MED ORDER — LATANOPROST 0.005 % OP SOLN
1.00 [drp] | Freq: Every evening | OPHTHALMIC | Status: DC
Start: 2019-02-27 — End: 2019-03-01
  Administered 2019-02-27 – 2019-02-28 (×2): 1 [drp] via OPHTHALMIC
  Filled 2019-02-27: qty 1

## 2019-02-27 MED ORDER — ALBUTEROL SULFATE (2.5 MG/3ML) 0.083% IN NEBU
2.50 mg | INHALATION_SOLUTION | Freq: Four times a day (QID) | RESPIRATORY_TRACT | Status: DC | PRN
Start: 2019-02-27 — End: 2019-03-01

## 2019-02-27 MED ORDER — SODIUM CHLORIDE (PF) 0.9 % IJ SOLN
3.00 mL | Freq: Three times a day (TID) | INTRAMUSCULAR | Status: DC
Start: 2019-02-27 — End: 2019-03-01
  Administered 2019-02-27 – 2019-03-01 (×5): 3 mL via INTRAVENOUS

## 2019-02-27 MED ORDER — VH SPIRONOLACTONE 25 MG PO TABS
25.0000 mg | ORAL_TABLET | Freq: Every day | ORAL | Status: DC
Start: 2019-02-27 — End: 2019-03-01
  Administered 2019-02-27 – 2019-03-01 (×3): 25 mg via ORAL
  Filled 2019-02-27 (×3): qty 1

## 2019-02-27 MED ORDER — VH DILTIAZEM HCL ER BEADS 120 MG PO CP24 (WRAP)
240.00 mg | ORAL_CAPSULE | Freq: Two times a day (BID) | ORAL | Status: DC
Start: 2019-02-27 — End: 2019-03-01
  Administered 2019-02-27 – 2019-03-01 (×4): 240 mg via ORAL
  Filled 2019-02-27 (×4): qty 2

## 2019-02-27 MED ORDER — HYDROCODONE-ACETAMINOPHEN 5-325 MG PO TABS
1.0000 | ORAL_TABLET | ORAL | Status: DC | PRN
Start: 2019-02-27 — End: 2019-03-01
  Administered 2019-02-27 – 2019-03-01 (×5): 1 via ORAL
  Filled 2019-02-27 (×5): qty 1

## 2019-02-27 MED ORDER — VH POTASSIUM CHLORIDE CRYS ER 10 MEQ PO TBCR (WRAP)
10.00 meq | EXTENDED_RELEASE_TABLET | Freq: Two times a day (BID) | ORAL | Status: DC
Start: 2019-02-27 — End: 2019-02-28
  Administered 2019-02-27 – 2019-02-28 (×2): 10 meq via ORAL
  Filled 2019-02-27 (×2): qty 1

## 2019-02-27 NOTE — Progress Notes (Signed)
[place] comes back today for a recheck.  She is volume overloaded.  She is uncomfortable with shortness of breath and peripheral edema.  I am going to admit her to the hospital.    Diagnosis: Biventricular congestive heart failure/chronic atrial fibrillation    Electronically signed by Graciela Husbands D.O.

## 2019-02-27 NOTE — Progress Notes (Signed)
Anticoagulation Progress Report   February 27, 2019  Name: SARENNA, [facility]    Medical Record Number: [id number]  Reason for Treatment: Atrial fibrillation unspecified     INR Range: 2.0-3.0    Date: 2019-02-27 13:39  Location: [facility]    INR: 2.80    Tablet Strength: 5 mg    Next INR: 7 days 2019-03-06    Progress Note: Patient would like to add 1 serving of vitamin K for the   week. Retest in 1 week.     Dosage Out: :Su-5mg :M-2.5mg :T-5mg :W-2.5mg :Th-5mg :F-2.5mg :Sa-5mg :

## 2019-02-27 NOTE — H&P (Signed)
[name] is a lovely 78 year old woman who comes in today for a recheck.  I saw her last week.  She was volume overloaded.  I made some medication adjustments.  She comes back today weighing 3 pounds more.  She is short of breath and she has tremendous lower extremity edema.  Her oxygen saturation is 87% on a 2 L/min supplement.  The patient has a history of chronic diastolic congestive heart failure caused by left ventricular concentric hypertrophy from years of hypertension that is associated with severe biatrial enlargement and chronic atrial fibrillation.  She also has pulmonary hypertension.  As a result she has biventricular failure.  She also has asthma which can be confounding when making decision about her shortness of breath.  Usually with asthma she has active wheezing.  That appears to be under control with a long-acting beta agonist/inhaled corticosteroid combination.  She is on warfarin chronically.  A recent cardiac echo revealed an ejection fraction of 50 to 55% with moderate aortic and mitral regurgitation.  She also had moderately severe tricuspid regurgitation with an elevated right ventricular systolic pressure.  She was hospitalized from 9/25-10/1 a few weeks ago.  It was during that time that she started out with heart failure and then proceeded to exacerbate her asthma.  After this she began seeing me.  I saw her back in the office on 10/15 and at that point she look volume overloaded.  Clearly this woman needs more diuretic.  I find out from a home nurse consultation yesterday that the patient is drinking a lot of fluid at home because of a dry mouth.    Active Ambulatory Problems     Diagnosis Date Noted   . CHF (congestive heart failure) 11/05/2013   . Permanent atrial fibrillation 11/06/2013   . Hyperkalemia 08/24/2014   . Acute on chronic diastolic CHF (congestive heart failure), NYHA class 3 08/25/2014   . On home oxygen therapy 08/25/2014   . Aortic regurgitation 08/25/2014   . Pacemaker  08/25/2014   . Pacemaker at end of battery life 03/22/2015   . Acute exacerbation of CHF (congestive heart failure) 07/16/2015   . 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 Chronic interstitial lung disease 07/19/2015   . Thoracic aortic aneurysm without rupture 07/19/2015   . Difficulty breathing 04/25/2017   . Chronic respiratory failure with hypoxia, on home O2 therapy 04/26/2017   . Nonobstructive atherosclerosis of coronary artery 04/23/2018   . SSS (sick sinus syndrome) 04/25/2018   . Acute on chronic diastolic (congestive) heart failure 01/27/2019   . Moderate asthma with acute exacerbation 01/28/2019   . Pulmonary HTN 01/30/2019   . Chronic venous insufficiency 02/13/2019   . Bilateral lower extremity edema 02/13/2019     Resolved Ambulatory Problems     Diagnosis Date Noted   . Urinary tract infection with hematuria, site unspecified    . Nausea 01/27/2019   . Headache 01/27/2019   . Constipation 01/27/2019     Past Medical History:   Diagnosis Date   . Aortic aneurysm    . Atrial fibrillation    . Chronic obstructive pulmonary disease    . Coronary artery disease    . Gastroesophageal reflux disease    . Hypertension    . Malignant neoplasm      Social History     Socioeconomic History   . Marital status: Widowed     Spouse name: Not on file   . Number of children: Not on file   .  Years of education: Not on file   . Highest education level: Not on file   Occupational History   . Not on file   Social Needs   . Financial resource strain: Not on file   . Food insecurity     Worry: Not on file     Inability: Not on file   . Transportation needs     Medical: Not on file     Non-medical: Not on file   Tobacco Use   . Smoking status: Never Smoker   . Smokeless tobacco: Never Used   Substance and Sexual Activity   . Alcohol use: No   . Drug use: No   . Sexual activity: Not on file   Lifestyle   . Physical activity     Days per week: Not on file     Minutes per session: Not on file   . Stress: Not on file    Relationships   . Social Wellsite geologist on phone: Not on file     Gets together: Not on file     Attends religious service: Not on file     Active member of club or organization: Not on file     Attends meetings of clubs or organizations: Not on file     Relationship status: Not on file   . Intimate partner violence     Fear of current or ex partner: Not on file     Emotionally abused: Not on file     Physically abused: Not on file     Forced sexual activity: Not on file   Other Topics Concern   . Not on file   Social History Narrative   . Not on file     Current Discharge Medication List      CONTINUE these medications which have NOT CHANGED    Details   albuterol (PROVENTIL) (2.5 MG/3ML) 0.083% nebulizer solution Take 2.5 mg by nebulization every 6 (six) hours as needed for Wheezing.      benzonatate (TESSALON) 200 MG capsule Take 200 mg by mouth 3 (three) times daily as needed.      carBAMazepine (TEGretol  XR) 100 MG 12 hr tablet TAKE 1 TABLET BY MOUTH ONCE DAILY/MOOD DISORDER      carbidopa-levodopa (SINEMET) 25-250 MG per tablet TAKE 1 TABLET BY MOUTH 2 TIMES A DAY/ROR TREMORS      diltiazem (TIAZAC) 240 MG 24 hr capsule Take 240 mg by mouth 2 (two) times daily.         esomeprazole (NEXIUM) 40 MG capsule TAKE 1 CAPSULE BY MOUTH EVERY DAY-STOP OMEPRAZOLE  Refills: 3      estradiol (ESTRACE) 0.1 MG/GM vaginal cream Place 2 g vaginally daily.      fluorometholone (EFLONE) 0.1 % ophthalmic suspension Place 1 drop into the left eye 4 (four) times daily         fluticasone furoate-vilanterol (BREO ELLIPTA) 100-25 MCG/INH Aerosol Pwdr, Breath Activated Inhale 1 puff into the lungs daily  Qty: 60 each, Refills: 5      furosemide (LASIX) 40 MG tablet TAKE 1 TABLET BY MOUTH TWO TIMES DAILY ON AN EMPTY STOMACH  Qty: 180 tablet, Refills: 0      HYDROcodone-acetaminophen (NORCO) 5-325 MG per tablet Take 1 tablet by mouth every 6 (six) hours as needed for Pain  Qty: 50 tablet, Refills: 0      ketoconazole  (NIZORAL) 2 % cream Apply topically daily.  latanoprost (XALATAN) 0.005 % ophthalmic solution Place 1 drop into both eyes nightly         potassium chloride (K-DUR) 10 MEQ tablet Take 1 tablet (10 mEq total) by mouth 2 (two) times daily  Qty: 60 tablet, Refills: 5      rosuvastatin (CRESTOR) 10 MG tablet Take 10 mg by mouth daily.      tiotropium (SPIRIVA RESPIMAT) 2.5 MCG/ACT inhalation spray Inhale 2 puffs into the lungs daily  Qty: 4 g, Refills: 5      warfarin (COUMADIN) 3 MG tablet Take 1 tablet (3 mg total) by mouth daily.  Qty: 30 tablet, Refills: 0      zolpidem (AMBIEN) 5 MG tablet Take 1 tablet (5 mg total) by mouth nightly as needed for Sleep  Qty: 90 tablet, Refills: 1           Review of Systems   Constitutional: Positive for malaise/fatigue. Negative for chills, fever and weight loss.   HENT: Positive for hearing loss.    Eyes:        She has glaucoma and she is on drops.   Respiratory: Positive for shortness of breath. Negative for cough, sputum production and wheezing.    Cardiovascular: Positive for palpitations, orthopnea, leg swelling and PND. Negative for chest pain.   Gastrointestinal: Negative for diarrhea, heartburn, nausea and vomiting.   Genitourinary: Positive for frequency and urgency. Negative for dysuria.   Musculoskeletal: Positive for back pain.        She was on hydrocodone for chronic back pain.   Skin: Negative.    Neurological: Negative for dizziness, tremors, sensory change and headaches.   Endo/Heme/Allergies:        She is anticoagulated because of chronic atrial fibrillation.  Her recent INR was 2.8.   Psychiatric/Behavioral: Negative for depression and memory loss. The patient is not nervous/anxious and does not have insomnia.      There were no vitals filed for this visit.    On exam she is a chronically ill woman in a wheelchair on supplemental oxygen.  She has a subconjunctival hemorrhage seen in the left eye.  Neck veins are distended  Cardiac rhythm is irregular S1-S2  are heard grade 1 systolic murmur at the apex  Lung fields with diminished breath sounds in both bases where she probably has pleural effusions.  Crackles are noted in the mid lung fields  Abdomen is protuberant soft and nontender  3+ pretibial edema.  There is darkened skin of both lower extremities from chronic venous insufficiency and pigmentation.  Her speech is fluent.  Intellect is normal.  She is not encephalopathic.    Active Hospital Problems    Diagnosis   . Acute on chronic systolic congestive heart failure   . Chronic venous insufficiency   . Pulmonary HTN   . Moderate asthma with acute exacerbation   . Acute on chronic diastolic (congestive) heart failure   . Aortic regurgitation   . Permanent atrial fibrillation     Plan: This woman is in biventricular failure.  We will get baseline EKG and chest x-ray.  Labs will be drawn.  This will include a B natruretic peptide.  Therapy will include the supplemental oxygen.  We will control the ventricular response with diltiazem.  She will be placed on telemetry.  I will place her on around-the-clock Lasix along with metolazone and spironolactone.  We will check her basic metabolic panel daily.  We will have her on telemetry.  We will  keep her at bedrest for now.  As she gets better we will increase her activity.    Electronically signed by Graciela Husbands D.O.

## 2019-02-28 ENCOUNTER — Other Ambulatory Visit: Payer: Self-pay

## 2019-02-28 DIAGNOSIS — I272 Pulmonary hypertension, unspecified: Secondary | ICD-10-CM

## 2019-02-28 DIAGNOSIS — I4821 Permanent atrial fibrillation: Secondary | ICD-10-CM

## 2019-02-28 DIAGNOSIS — I340 Nonrheumatic mitral (valve) insufficiency: Secondary | ICD-10-CM

## 2019-02-28 DIAGNOSIS — I5043 Acute on chronic combined systolic (congestive) and diastolic (congestive) heart failure: Secondary | ICD-10-CM

## 2019-02-28 DIAGNOSIS — I495 Sick sinus syndrome: Secondary | ICD-10-CM

## 2019-02-28 DIAGNOSIS — J4541 Moderate persistent asthma with (acute) exacerbation: Secondary | ICD-10-CM

## 2019-02-28 LAB — CBC AND DIFFERENTIAL
Basophils %: 1.1 % (ref 0.0–3.0)
Basophils Absolute: 0.1 10*3/uL (ref 0.0–0.3)
Eosinophils %: 2.5 % (ref 0.0–7.0)
Eosinophils Absolute: 0.1 10*3/uL (ref 0.0–0.8)
Hematocrit: 38.5 % (ref 36.0–48.0)
Hemoglobin: 11.7 gm/dL — ABNORMAL LOW (ref 12.0–16.0)
Lymphocytes Absolute: 0.5 10*3/uL — ABNORMAL LOW (ref 0.6–5.1)
Lymphocytes: 9.5 % — ABNORMAL LOW (ref 15.0–46.0)
MCH: 29 pg (ref 28–35)
MCHC: 30 gm/dL — ABNORMAL LOW (ref 31–36)
MCV: 97 fL (ref 80–100)
MPV: 7.5 fL (ref 6.0–10.0)
Monocytes Absolute: 0.7 10*3/uL (ref 0.1–1.7)
Monocytes: 14.1 % (ref 3.0–15.0)
Neutrophils %: 72.8 % (ref 42.0–78.0)
Neutrophils Absolute: 3.8 10*3/uL (ref 1.7–8.6)
PLT CT: 199 10*3/uL (ref 130–440)
RBC: 3.99 10*6/uL (ref 3.80–5.00)
RDW: 14 % (ref 10.5–14.5)
WBC: 5.2 10*3/uL (ref 4.0–11.0)

## 2019-02-28 LAB — BASIC METABOLIC PANEL
Anion Gap: 15.2 mMol/L (ref 7.0–18.0)
BUN / Creatinine Ratio: 22.9 Ratio (ref 10.0–30.0)
BUN: 24 mg/dL — ABNORMAL HIGH (ref 7–22)
CO2: 38 mMol/L — ABNORMAL HIGH (ref 20.0–30.0)
Calcium: 10.9 mg/dL — ABNORMAL HIGH (ref 8.5–10.5)
Chloride: 87 mMol/L — ABNORMAL LOW (ref 98–110)
Creatinine: 1.05 mg/dL (ref 0.60–1.20)
EGFR: 50 mL/min/{1.73_m2} — ABNORMAL LOW (ref 60–150)
Glucose: 91 mg/dL (ref 71–99)
Osmolality Calculated: 277 mOsm/kg (ref 275–300)
Potassium: 3.2 mMol/L — ABNORMAL LOW (ref 3.5–5.3)
Sodium: 137 mMol/L (ref 136–147)

## 2019-02-28 LAB — ECG 12-LEAD
Patient Age: 78 years
Q-T Interval(Corrected): 445 ms
Q-T Interval: 361 ms
QRS Axis: 4 deg
QRS Duration: 118 ms
T Axis: -16 years
Ventricular Rate: 91 //min

## 2019-02-28 LAB — PT/INR
PT INR: 2.5 — ABNORMAL HIGH (ref 0.9–1.2)
PT: 26.1 s — ABNORMAL HIGH (ref 9.4–11.3)

## 2019-02-28 MED ORDER — VH WARFARIN THERAPY PLACEHOLDER
1.00 | Status: DC
Start: 2019-02-28 — End: 2019-03-01

## 2019-02-28 MED ORDER — VH POTASSIUM CHLORIDE CRYS ER 10 MEQ PO TBCR (WRAP)
10.00 meq | EXTENDED_RELEASE_TABLET | Freq: Four times a day (QID) | ORAL | Status: DC
Start: 2019-02-28 — End: 2019-03-01
  Administered 2019-02-28 – 2019-03-01 (×5): 10 meq via ORAL
  Filled 2019-02-28 (×5): qty 1

## 2019-02-28 MED ORDER — PREDNISONE 20 MG PO TABS
40.0000 mg | ORAL_TABLET | Freq: Every morning | ORAL | Status: DC
Start: 2019-02-28 — End: 2019-03-01
  Administered 2019-02-28 – 2019-03-01 (×2): 40 mg via ORAL
  Filled 2019-02-28 (×2): qty 2

## 2019-02-28 MED ORDER — FLUTICASONE FUROATE-VILANTEROL 100-25 MCG/INH IN AEPB
1.00 | INHALATION_SPRAY | Freq: Every morning | RESPIRATORY_TRACT | Status: DC
Start: 2019-02-28 — End: 2019-03-01
  Administered 2019-02-28 – 2019-03-01 (×2): 1 via RESPIRATORY_TRACT
  Filled 2019-02-28: qty 14

## 2019-02-28 MED ORDER — FUROSEMIDE 10 MG/ML IJ SOLN
40.00 mg | Freq: Two times a day (BID) | INTRAMUSCULAR | Status: DC
Start: 2019-02-28 — End: 2019-03-01
  Administered 2019-02-28 – 2019-03-01 (×3): 40 mg via INTRAVENOUS
  Filled 2019-02-28 (×3): qty 4

## 2019-02-28 NOTE — Progress Notes (Signed)
02/28/19 0954   Readmission Patient Interview/Contributing Factors   At discharge, discuss signs/symptoms? Yes   At discharge, discuss what to do for worsening of disease? Yes   At discharge, discuss who to contact? Yes   Asked if you understood instructions? Yes   D/C Instructions written, given to you? Yes   D/C Instructions easy to read? Yes   Post D/C Follow Up Made? Yes  (not until 10/15- in ER 10/13)   When did you go in for D/C follow-up? 10/15- in ER 10/13   What medications are you currently taking? unable to recall all off top of head   When did you fill new prescriptions? at time of discharge   How are you taking your medication? yes   Pt can tell why here/diagnosis yes   Contributing Factors to Readmission Other (enter comment)  (appointment too far removed from discharge on 10/1)   Patient active with Home Health? No, patient refused on previous discharge  (has CNCM)   Patient active with home hospice? No   Was patient readmitted from a facility? Not readmitted from a facility   Could admission have been avoided? no- advancing disease process

## 2019-02-28 NOTE — Progress Notes (Signed)
[name] was admitted yesterday from my clinic where I found her volume overloaded, edematous and short of breath.  The details of this are contained in the history and physical.  We began IV Lasix, oral metolazone, spironolactone and water restriction.  Since admission she has had 4.5 L of urine output with marked improvement in the edema in her breathing.  She looks much better than yesterday.    Vitals:    02/28/19 0801   BP: 135/65   Pulse:    Resp:    Temp:    SpO2:    She has lost 5 kg overnight.  Skin is warm and dry  There is no neck vein distention  Lungs are clear bilaterally  Abdomen soft and nontender    Chest x-ray on admission reveals massive cardiomegaly.  There is some blunting of the left costophrenic angle.  There is some pulmonary interstitial edema.    Results     Procedure Component Value Units Date/Time    Prothrombin time/INR [[id number]]  (Abnormal) Collected: 02/28/19 0525    Specimen: Blood Updated: 02/28/19 0943     PT 26.1 sec      PT INR 2.5    Basic Metabolic Panel [[id number]]  (Abnormal) Collected: 02/28/19 0525    Specimen: Plasma Updated: 02/28/19 0604     Sodium 137 mMol/L      Potassium 3.2 mMol/L      Chloride 87 mMol/L      CO2 38.0 mMol/L      Calcium 10.9 mg/dL      Glucose 91 mg/dL      Creatinine 0.86 mg/dL      BUN 24 mg/dL      Anion Gap 57.8 mMol/L      BUN / Creatinine Ratio 22.9 Ratio      EGFR 50 mL/min/1.74m2      Osmolality Calculated 277 mOsm/kg     CBC and differential [[id number]]  (Abnormal) Collected: 02/28/19 0525    Specimen: Blood Updated: 02/28/19 0555     WBC 5.2 K/cmm      RBC 3.99 M/cmm      Hemoglobin 11.7 gm/dL      Hematocrit 41.3 %      MCV 97 fL      MCH 29 pg      MCHC 30 gm/dL      RDW 24.4 %      PLT CT 199 K/cmm      MPV 7.5 fL      Neutrophils % 72.8 %      Lymphocytes 9.5 %      Monocytes 14.1 %      Eosinophils % 2.5 %      Basophils % 1.1 %      Neutrophils Absolute 3.8 K/cmm      Lymphocytes Absolute 0.5 K/cmm      Monocytes Absolute 0.7 K/cmm       Eosinophils Absolute 0.1 K/cmm      Basophils Absolute 0.1 K/cmm     B-type Natriuretic Peptide [[id number]]  (Abnormal) Collected: 02/27/19 1558    Specimen: Blood Updated: 02/27/19 1655     B-Natriuretic Peptide 216.2 pg/mL     Magnesium [[id number]] Collected: 02/27/19 1558    Specimen: Plasma Updated: 02/27/19 1645     Magnesium 1.7 mg/dL     Basic Metabolic Panel [[id number]]  (Abnormal) Collected: 02/27/19 1558    Specimen: Plasma Updated: 02/27/19 1645     Sodium 136 mMol/L      Potassium 3.6  mMol/L      Chloride 93 mMol/L      CO2 29.0 mMol/L      Calcium 10.6 mg/dL      Glucose 161 mg/dL      Creatinine 0.96 mg/dL      BUN 19 mg/dL      Anion Gap 04.5 mMol/L      BUN / Creatinine Ratio 20.4 Ratio      EGFR 58 mL/min/1.34m2      Osmolality Calculated 274 mOsm/kg         Active Hospital Problems    Diagnosis   . Acute on chronic systolic congestive heart failure   . Chronic venous insufficiency   . Pulmonary HTN   . Moderate asthma with acute exacerbation   . Acute on chronic diastolic (congestive) heart failure   . Aortic regurgitation   . Permanent atrial fibrillation     Plan: The patient has improved nicely overnight with diuresis.  I think she may be ready to go home tomorrow.  I have increased her potassium a bit.  We will continue the spironolactone, Lasix and fluid restriction when she goes home.  Prior to admission the patient was drinking an awful lot of water.    Electronically signed by Graciela Husbands D.O.

## 2019-02-28 NOTE — Progress Notes (Signed)
Readmission Risk  [facility] - MEDICAL/SURGICAL   Patient Name: [facility] [name]   Attending Physician: Graciela Husbands, DO   Today's date:   02/28/2019 LOS: 1 days   Expected Discharge Date Expected Discharge Date: (P) 03/02/19    Readmission Assessment:                                                              Discharge Planning  Expected Discharge Date: (P) 03/02/19  ReAdmit Risk Score: 19  Does the patient have perscription coverage?: (P) Yes  Utilize [place] Med Program: (P) N/A  Confirmed PCP with Pt: (P) Yes  Confirmed PCP name: (P) Dr. Amada Jupiter  Last PCP Visit Date: (P) 02/27/2019  Confirm Transport to F/U Appt.: (P) Self/Private Vehicle/Friend  DME anticipated at discharge: (P) (no known discharge DME needs)  Anticipated Home Health at [place]: (P) Refused  Anticipated Placement at [place]: (P) No  CM Comments: (P) 10/30 CNCM referral; readmit for PMH       IDPA:   Patient Type  Within 30 Days of Previous Admission?: Yes  Healthcare Decisions  Interviewed:: Patient  Orientation/Decision Making Abilities of Patient: Alert and Oriented x3, able to make decisions  Advance Directive: Patient has advance directive, copy in chart  Healthcare Agent Appointed: Yes  Prior to admission  Prior level of function: Independent with ADLs, Ambulates independently  Type of Residence: Private residence  Home Layout: One level, Ramped entrance  Have running water, electricity, heat, etc?: Yes  Living Arrangements: Children  How do you get to your MD appointments?: self/family  How do you get your groceries?: family  Who fixes your meals?: family  Who does your laundry?: family  Who picks up your prescriptions?: self/family  Dressing: Independent  Grooming: Independent  Feeding: Independent  Bathing: Independent  Toileting: Independent  DME Currently at Home: ADL- Paediatric nurse, ADL- Grab Bars, [place], Single [place], Home O2, Nebulizer, Portable O2 Tank, [place], Front Wheel  Adult Management consultant (APS) involved?: No(see  note)  Discharge Planning  Support Systems: Children  Patient expects to be discharged to:: home  Anticipated [place] plan discussed with:: Same as interviewed  Mode of transportation:: Private car (family member)  Does the patient have perscription coverage?: (P) Yes  Consults/Providers  PT Evaluation Needed: No  OT Evalulation Needed: No  SLP Evaluation Needed: No  Correct PCP listed in Epic?: Yes  Family and PCP  PCP on file was verified as the current PCP?: Yes   30 Day Readmission:       Provider Notifications:            02/28/19 0946   Discharge Planning   Expected Discharge Date 03/02/19   Does the patient have perscription coverage? Yes   Utilize [place] Med Program N/A   Confirmed PCP with Pt Yes   Confirmed PCP name Dr. Amada Jupiter   Last PCP Visit Date 02/27/2019   Confirm Transport to F/U Appt. Self/Private Vehicle/Friend   DME anticipated at discharge   (no known discharge DME needs)   Anticipated Home Health at [place] Refused   Anticipated Placement at [place] No   CM Comments 10/30 CNCM referral; readmit for PMH

## 2019-02-28 NOTE — UM Notes (Signed)
[facility] Utilization Management Review Sheet    Facility :  [facility]    NAME: [name]  MR#: [id number]    CSN#: [id number]    ROOM: 213/213-A AGE: 78 y.o.    ADMIT DATE AND TIME: 02/27/2019  3:15 PM      PATIENT CLASS: Inpatient<> 02/27/2019 3:13 PM    ATTENDING PHYSICIAN: Graciela Husbands, DO  PAYOR:Payor: MEDICARE / Plan: MEDICARE PART A AND B / Product Type: Medicare /       AUTH #:     DIAGNOSIS: Active Problems:    Permanent atrial fibrillation    Aortic regurgitation    Acute on chronic diastolic (congestive) heart failure    Moderate asthma with acute exacerbation    Pulmonary HTN    Chronic venous insufficiency    Acute on chronic systolic congestive heart failure      HISTORY:   Past Medical History:   Diagnosis Date    Aortic aneurysm     Atrial fibrillation     Chronic obstructive pulmonary disease     Coronary artery disease     Gastroesophageal reflux disease     Hypertension     Malignant neoplasm     Pacemaker     SSS (sick sinus syndrome)        DATE OF REVIEW: 02/28/2019    VITALS: BP 135/65    Pulse 81    Temp 97.9 F (36.6 C) (Oral)    Resp 18    Ht 1.651 m (5\' 5" )    Wt 74.7 kg (164 lb 10.9 oz)    SpO2 95%    BMI 27.40 kg/m     Active Hospital Problems    Diagnosis    Acute on chronic systolic congestive heart failure    Chronic venous insufficiency    Pulmonary HTN    Moderate asthma with acute exacerbation    Acute on chronic diastolic (congestive) heart failure    Aortic regurgitation    Permanent atrial fibrillation     HPI<>  21 year old woman who comes in today for a recheck.  I saw her last week.  She was volume overloaded.  I made some medication adjustments.  She comes back today weighing 3 pounds more.  She is short of breath and she has tremendous lower extremity edema.  Her oxygen saturation is 87% on a 2 L/min supplement.  The patient has a history of chronic diastolic congestive heart failure caused by left ventricular concentric hypertrophy  from years of hypertension that is associated with severe biatrial enlargement and chronic atrial fibrillation.  She also has pulmonary hypertension.  As a result she has biventricular failure.  She also has asthma which can be confounding when making decision about her shortness of breath.  Usually with asthma she has active wheezing.      A recent cardiac echo revealed an ejection fraction of 50 to 55% with moderate aortic and mitral regurgitation.  She also had moderately severe tricuspid regurgitation with an elevated right ventricular systolic pressure.  She was hospitalized from 9/25-10/1 a few weeks ago.     H&P<>Dr Amada Jupiter    On exam she is a chronically ill woman in a wheelchair on supplemental oxygen.  She has a subconjunctival hemorrhage seen in the left eye.  Neck veins are distended  Cardiac rhythm is irregular S1-S2 are heard grade 1 systolic murmur at the apex  Lung fields with diminished breath sounds in both bases where she probably has pleural  effusions.  Crackles are noted in the mid lung fields  Abdomen is protuberant soft and nontender  3+ pretibial edema.  There is darkened skin of both lower extremities from chronic venous insufficiency and pigmentation.  Plan: This woman is in biventricular failure.  We will get baseline EKG and chest x-ray.  Labs will be drawn.  This will include a B natruretic peptide.  Therapy will include the supplemental oxygen.  We will control the ventricular response with diltiazem.  She will be placed on telemetry.  I will place her on around-the-clock Lasix along with metolazone and spironolactone.  We will check her basic metabolic panel daily.  We will have her on telemetry.  We will keep her at bedrest for now.  As she gets better we will increase her activity.    Electronically signed by Graciela Husbands D.O.    CXR<> IMPRESSION:   1.  Cardiomegaly and atherosclerosis.  2.  Persistent elevation of the left hemidiaphragm with mild overlying  atelectasis.        Inpatient  Cbc. Pt/inr. Bmp, Fluid restriction. Oxygen 2L per NC. Telemetry. ML. Daily weight. I&O. Foley cath. Lasix 40mg  IV 2x daily.  Hydrocodone 5/325mg  1 tab po Q4H prn.      CSR<> 02/28/2019<>Dr Amada Jupiter    Since admission she has had 4.5 L of urine output with marked improvement in the edema in her breathing.  She looks much better than yesterday.  She has lost 5 kg overnight.    Plan: The patient has improved nicely overnight with diuresis.  I think she may be ready to go home tomorrow.  I have increased her potassium a bit.  We will continue the spironolactone, Lasix and fluid restriction when she goes home.  Prior to admission the patient was drinking an awful lot of water.      Meets IP  MCG<> [id number]    Lowella Dell RN,CCM/UR    [facility]  [address]  [place], [place] [address]    [facility]: [phone]  FX: [phone]

## 2019-02-28 NOTE — Progress Notes (Signed)
78 yo female who admits to PMH with diagnosis of CHF. She is noted to be a readmission. Upon entry into room, patient resting in bed with oxygen on. She voices feeling much better than upon arrival. Patient is noted to be alert and oriented x 4 during our visit. She states her daughter and son-in-law live with her in her home- 1 level with basement and ramped entrance. Patient voices being independent in bathing/dressing/toilet, although she does mention that it is difficult re: her breathing/energy level. She states that her daughter will help, if needed but encourage her mother to be independent. Patient's daughter, Elnita Maxwell does the household chores, laundry, shopping and cooking. Patient voices that she will "dust" sometimes. Patient voices that she continues to drive but when she is unable to drive, her family or neighbor will provide transportation to where she needs to go. Patient states that she normally furniture surfs in the home and uses a cane, as needed in the community. In addition to the [facility], she has 2 FWW's, shower chair, grab bars, nebulizer, oxygen and portable oxygen. She gets her oxygen supplies through [facility] and voices no concerns with their service. Patient is noted to have CNCM services and would like to continue these services at time of discharge. Declines Home Health. Patient is noted to have Medicare and FEP [employer]. She voices having RX coverage and using [facility] for her RX needs([facility] for eye drops). Dr. Amada Jupiter is verified as her PCP with last visit being yesterday. Received information that patient may have concerns with living situation at home. Approached this subject with patient. She voiced that she feels that her son-in-law is sometimes a "bully" but there is no physical abuse and she does not feel unsafe in her home. Talked with her about it being her home and if at any time she feels unsafe or uncomfortable with the current situation, she has the right to evict those living  with her and/or call the authorities if needed. I informed her that I could contact APS but she declines as she states, "oh no, it's fine, we've made up". CM will continue to assess patient needs and intervene as indicated.      02/28/19 0910   Patient Type   Within 30 Days of Previous Admission? Yes   Healthcare Decisions   Interviewed: Patient   Orientation/Decision Making Abilities of Patient Alert and Oriented x3, able to make decisions   Level of Function 2 Weeks Prior to Admission   Prior level of function Independent with ADLs;Ambulates independently   Level of Function Upon Admission   Current Level of Function Needs assistance with ADLs;Ambulates with assistive device   Prior to admission   Type of Residence Private residence   Home Layout One level;Ramped entrance   Have running water, electricity, heat, etc? Yes   Living Arrangements Children   How do you get to your MD appointments? self/family   How do you get your groceries? family   Who fixes your meals? family   Who does your laundry? family   Who picks up your prescriptions? self/family   DME Currently at Home ADL- Shower Chair;ADL- Grab Bars;Cane, Single Point;Home O2;Nebulizer;Portable O2 Tank;[place], Front Wheel   Adult [employer] (APS) involved? No  (see note)   Discharge Planning   Support Systems Children   Patient expects to be discharged to: home   Anticipated [place] plan discussed with: Same as interviewed   Mode of transportation: Private car (family member)   Does the  patient have perscription coverage? Yes   Consults/Providers   Correct PCP listed in Epic? Yes   Family and PCP   PCP on file was verified as the current PCP? Yes   Important Message from [facility] Notice   Patient received 1st IMM Letter? Yes   Date of most recent IMM given: 02/27/19

## 2019-03-01 DIAGNOSIS — M109 Gout, unspecified: Secondary | ICD-10-CM | POA: Diagnosis not present

## 2019-03-01 LAB — BASIC METABOLIC PANEL
Anion Gap: 17.9 mMol/L (ref 7.0–18.0)
BUN / Creatinine Ratio: 22.7 Ratio (ref 10.0–30.0)
BUN: 30 mg/dL — ABNORMAL HIGH (ref 7–22)
CO2: 37 mMol/L — ABNORMAL HIGH (ref 20.0–30.0)
Calcium: 10.7 mg/dL — ABNORMAL HIGH (ref 8.5–10.5)
Chloride: 86 mMol/L — ABNORMAL LOW (ref 98–110)
Creatinine: 1.32 mg/dL — ABNORMAL HIGH (ref 0.60–1.20)
EGFR: 38 mL/min/{1.73_m2} — ABNORMAL LOW (ref 60–150)
Glucose: 145 mg/dL — ABNORMAL HIGH (ref 71–99)
Osmolality Calculated: 283 mOsm/kg (ref 275–300)
Potassium: 3.9 mMol/L (ref 3.5–5.3)
Sodium: 137 mMol/L (ref 136–147)

## 2019-03-01 LAB — PT/INR
PT INR: 2.5 — ABNORMAL HIGH (ref 0.9–1.2)
PT: 25.7 s — ABNORMAL HIGH (ref 9.4–11.3)

## 2019-03-01 MED ORDER — SPIRONOLACTONE 25 MG PO TABS
25.00 mg | ORAL_TABLET | Freq: Every day | ORAL | 11 refills | Status: AC
Start: 2019-03-02 — End: ?

## 2019-03-01 MED ORDER — PREDNISONE 10 MG PO TABS
ORAL_TABLET | ORAL | 1 refills | Status: DC
Start: 2019-03-01 — End: 2019-05-21

## 2019-03-01 MED ORDER — HYDROCODONE-ACETAMINOPHEN 5-325 MG PO TABS
1.0000 | ORAL_TABLET | Freq: Four times a day (QID) | ORAL | 0 refills | Status: DC | PRN
Start: 2019-03-01 — End: 2019-03-24

## 2019-03-01 MED ORDER — CARBAMAZEPINE ER 100 MG PO TB12
100.00 mg | ORAL_TABLET | Freq: Every day | ORAL | Status: DC | PRN
Start: 2019-03-01 — End: 2019-04-13

## 2019-03-01 MED ORDER — ALLOPURINOL 100 MG PO TABS
100.0000 mg | ORAL_TABLET | Freq: Every day | ORAL | 5 refills | Status: DC
Start: 2019-03-01 — End: 2019-07-10

## 2019-03-01 MED ORDER — POTASSIUM CHLORIDE ER 10 MEQ PO TBCR
10.00 meq | EXTENDED_RELEASE_TABLET | Freq: Every day | ORAL | 11 refills | Status: AC
Start: 2019-03-01 — End: ?

## 2019-03-01 MED ORDER — FUROSEMIDE 40 MG PO TABS
40.0000 mg | ORAL_TABLET | Freq: Every day | ORAL | 0 refills | Status: DC
Start: 2019-03-01 — End: 2019-04-29

## 2019-03-01 NOTE — Progress Notes (Signed)
Discharge instructions reviewed with [name] and she verbalized understanding via teach back. She is dressed, has her personal belongings and a copy of her discharge instructions. She is waiting on her ride to arrive.

## 2019-03-01 NOTE — Plan of Care (Signed)
Problem: Moderate/High Fall Risk Score >5  Goal: Patient will remain free of falls  Outcome: Progressing

## 2019-03-01 NOTE — Progress Notes (Signed)
Transportation arrived. Pt discharged

## 2019-03-01 NOTE — Discharge Summary (Signed)
Medicine Discharge Summary   [facility]  [facility]   Patient Name: [facility] [name]   Attending Physician: Graciela Husbands, DO PCP: Graciela Husbands, DO   Date of Admission: 02/27/2019 D/C Date: 03/01/2019   Discharge Diagnoses:     Acute on chronic diastolic congestive heart failure  Chronic atrial fibrillation  Marked cardiac enlargement, chronic  Pulmonary hypertension  Severe peripheral edema  Acute gouty arthritis both feet from diuretics  History of asthma  Chronic hypoxic respiratory failure, corrected with oxygen     Hospital Course       [name] is a 78 y.o. female patient that was admitted on 02/27/2019 she was admitted through my clinic volume overloaded with difficulty breathing and severe peripheral edema.  Initially we diuresed her with IV Lasix and oral metolazone.  I started spironolactone on admission as well.  The metolazone was used for 1 day.  The Foley catheter was removed on day 2 and the patient's Lasix dose was cut to 40 mg twice daily.  The edema went away and as it did she began getting severe pain in her feet.  This was consistent with gout caused by the diuretic.  I added prednisone with prompt relief.  She is going home today in stable condition.  The edema is gone.  She is breathing easily.  She is up moving around the room without any problem.  She is on supplemental oxygen at home that we will continue.  She also has asthma which has not been a problem during this hospitalization.  I will see her back in the office in 1 to 2 weeks.  We will contact her for an appointment.  She will go home on allopurinol 100 mg daily.  She will have a prednisone taper and maintained on 10 mg daily for probably 4 to 6 weeks as the uric acid equilibrates with the synovial fluid uric acid level.    Principal Problem:    Acute on chronic diastolic (congestive) heart failure  Active Problems:    Permanent atrial fibrillation     Aortic regurgitation    Moderate asthma with acute exacerbation    Pulmonary HTN    Chronic venous insufficiency    Acute gout of right foot  Resolved Problems:    * No resolved hospital problems. *     Pending Results and other significant studies:  None     Discharge Instructions:          Disposition: Home  Diet: 4 Gram Sodium (Salt) Restricted Diet  Activity: As tolerated  Discharge Code Status: NO CPR  -  ALLOW NATURAL DEATH    No follow-up provider specified.     Discharge Medications:                                                                        Discharge Medication List      Taking    albuterol (2.5 MG/3ML) 0.083% nebulizer solution  Dose: 2.5 mg  Commonly known as: PROVENTIL  Take 2.5 mg by nebulization every 6 (six) hours as needed for Wheezing.     allopurinol 100 MG tablet  Dose: 100 mg  Commonly known as:  ZYLOPRIM  Take 1 tablet (100 mg total) by mouth daily For gout prevention     carBAMazepine 100 MG 12 hr tablet  Dose: 100 mg  What changed: See the new instructions.  Commonly known as: TEGretol  XR  Take 1 tablet (100 mg total) by mouth daily as needed ("nerves")     dilTIAZem 240 MG 24 hr capsule  Dose: 240 mg  Commonly known as: TIAZAC  Take 240 mg by mouth 2 (two) times daily.      fluorometholone 0.1 % ophthalmic suspension  Dose: 1 drop  Commonly known as: EFLONE  Place 1 drop into the left eye 4 (four) times daily      fluticasone furoate-vilanterol 100-25 MCG/INH Aepb  Dose: 1 puff  Commonly known as: BREO ELLIPTA  Inhale 1 puff into the lungs daily     furosemide 40 MG tablet  Dose: 40 mg  What changed: See the new instructions.  Commonly known as: LASIX  Take 1 tablet (40 mg total) by mouth daily     HYDROcodone-acetaminophen 5-325 MG per tablet  Dose: 1 tablet  Commonly known as: NORCO  Take 1 tablet by mouth every 6 (six) hours as needed for Pain     ketoconazole 2 % cream  Commonly known as: NIZORAL  Apply topically daily.     latanoprost 0.005 % ophthalmic solution  Dose: 1  drop  Commonly known as: XALATAN  Place 1 drop into both eyes nightly      potassium chloride 10 MEQ tablet  Dose: 10 mEq  What changed: when to take this  Commonly known as: K-DUR  Take 1 tablet (10 mEq total) by mouth daily     predniSONE 10 MG tablet  Commonly known as: DELTASONE  For: Acute Joint Inflammation in Gout  Take 4,3,2, and then stay on 1 tablet per day     rosuvastatin 10 MG tablet  Dose: 10 mg  Commonly known as: CRESTOR  Take 10 mg by mouth daily.     spironolactone 25 MG tablet  Dose: 25 mg  Commonly known as: ALDACTONE  Start taking on: March 02, 2019  Take 1 tablet (25 mg total) by mouth daily     tiotropium 2.5 MCG/ACT inhalation spray  Dose: 2 puff  Commonly known as: SPIRIVA RESPIMAT  Inhale 2 puffs into the lungs daily     warfarin 3 MG tablet  Dose: 3 mg  What changed:    how much to take   additional instructions  Commonly known as: COUMADIN  Take 1 tablet (3 mg total) by mouth daily.        STOP taking these medications    benzonatate 200 MG capsule  Commonly known as: TESSALON     carbidopa-levodopa 25-250 MG per tablet  Commonly known as: SINEMET     esomeprazole 40 MG capsule  Commonly known as: NexIUM     estradiol 0.1 MG/GM vaginal cream  Commonly known as: ESTRACE     zolpidem 5 MG tablet  Commonly known as: AMBIEN             Discharge Day Exam (03/01/2019):      Recent Labs      Recent Labs   Lab 02/28/19  0525   WBC 5.2   RBC 3.99   Hemoglobin 11.7*   Hematocrit 38.5   MCV 97   PLT CT 199     Recent Labs   Lab 03/01/19  0600 02/28/19  0525 02/27/19  1350   PT 25.7* 26.1*  --    PT INR 2.5* 2.5*  --    INR POCT  --   --  2.8         No results found for: [id number]  Recent Labs   Lab 03/01/19  0600 02/28/19  0525 02/27/19  1558   Glucose 145* 91 101*   Sodium 137 137 136   Potassium 3.9 3.2* 3.6   Chloride 86* 87* 93*   CO2 37.0* 38.0* 29.0   BUN 30* 24* 19   Creatinine 1.32* 1.05 0.93   EGFR 38* 50* 58*   Calcium 10.7* 10.9* 10.6*     Recent Labs   Lab 02/27/19  1558    Magnesium 1.7        Allergies:      Adhesive [wound dressing adhesive], Codeine, Fentanyl, Morphine, Morphine and related, Nitroglycerin, Pain patch [menthol], and Sulfa antibiotics   Time spent on discharging the patient:  30 minutes   Xr Chest Ap Portable    Result Date: 02/27/2019  1.  Cardiomegaly and atherosclerosis. 2.  Persistent elevation of the left hemidiaphragm with mild overlying atelectasis. ReadingStation:[id number]     Home Health Needs:  There are no questions and answers to display.      Graciela Husbands, DO         03/01/19 12:27 PM   MRN: [id number]                                      CSN: [id number] DOB: 08/13/1940

## 2019-03-01 NOTE — Discharge Instr - AVS First Page (Signed)
I have reduced your Lasix (furosemide) to 1 tablet/day.  With this she can take 1 potassium tablet per day.    I have added another fluid pill called spironolactone 25 mg/day.    As we mentioned, control your fluid intake to 30 to 40 ounces per day.  This together with the fluid pills should keep the swelling away.  You should also follow a low-salt diet.    I have added prednisone for the gout pain in your feet.  You have 10 mg tablets.  Take 4 of them tomorrow and then 3 the next day and then 2 on Tuesday.  After that take 1/day.  I have added allopurinol to reduce uric acid, the culprit for gout.  As you stay on this you can come off the prednisone eventually.    I will see you back in the office in the next 1 to 2 weeks.      Dr. Amada Jupiter

## 2019-03-06 ENCOUNTER — Ambulatory Visit: Payer: Medicare Other | Attending: Internal Medicine

## 2019-03-06 ENCOUNTER — Encounter (RURAL_HEALTH_CENTER): Payer: Self-pay | Admitting: Internal Medicine

## 2019-03-06 ENCOUNTER — Ambulatory Visit: Payer: Medicare Other | Attending: Internal Medicine | Admitting: Internal Medicine

## 2019-03-06 ENCOUNTER — Other Ambulatory Visit
Admission: RE | Admit: 2019-03-06 | Discharge: 2019-03-06 | Disposition: A | Discharge status: Home / Self Care | Payer: Medicare Other | Source: Ambulatory Visit | Attending: Internal Medicine | Admitting: Internal Medicine

## 2019-03-06 VITALS — BP 136/54 | HR 81 | Temp 98.8°F | Ht 65.0 in | Wt 166.0 lb

## 2019-03-06 DIAGNOSIS — I4821 Permanent atrial fibrillation: Secondary | ICD-10-CM

## 2019-03-06 DIAGNOSIS — J9611 Chronic respiratory failure with hypoxia: Secondary | ICD-10-CM

## 2019-03-06 DIAGNOSIS — I872 Venous insufficiency (chronic) (peripheral): Secondary | ICD-10-CM

## 2019-03-06 DIAGNOSIS — Z7901 Long term (current) use of anticoagulants: Secondary | ICD-10-CM | POA: Insufficient documentation

## 2019-03-06 DIAGNOSIS — I5032 Chronic diastolic (congestive) heart failure: Secondary | ICD-10-CM

## 2019-03-06 DIAGNOSIS — Z09 Encounter for follow-up examination after completed treatment for conditions other than malignant neoplasm: Secondary | ICD-10-CM

## 2019-03-06 DIAGNOSIS — I351 Nonrheumatic aortic (valve) insufficiency: Secondary | ICD-10-CM

## 2019-03-06 DIAGNOSIS — M10271 Drug-induced gout, right ankle and foot: Secondary | ICD-10-CM

## 2019-03-06 DIAGNOSIS — Z5181 Encounter for therapeutic drug level monitoring: Secondary | ICD-10-CM | POA: Insufficient documentation

## 2019-03-06 DIAGNOSIS — Z9981 Dependence on supplemental oxygen: Secondary | ICD-10-CM

## 2019-03-06 DIAGNOSIS — I4891 Unspecified atrial fibrillation: Secondary | ICD-10-CM | POA: Insufficient documentation

## 2019-03-06 LAB — BASIC METABOLIC PANEL
Anion Gap: 16.4 mMol/L (ref 7.0–18.0)
BUN / Creatinine Ratio: 38.2 Ratio — ABNORMAL HIGH (ref 10.0–30.0)
BUN: 42 mg/dL — ABNORMAL HIGH (ref 7–22)
CO2: 35 mMol/L — ABNORMAL HIGH (ref 20–30)
Calcium: 10.6 mg/dL — ABNORMAL HIGH (ref 8.5–10.5)
Chloride: 88 mMol/L — ABNORMAL LOW (ref 98–110)
Creatinine: 1.1 mg/dL (ref 0.60–1.20)
EGFR: 48 mL/min/{1.73_m2} — ABNORMAL LOW (ref 60–150)
Glucose: 121 mg/dL — ABNORMAL HIGH (ref 71–99)
Osmolality Calculated: 282 mOsm/kg (ref 275–300)
Potassium: 4.4 mMol/L (ref 3.5–5.3)
Sodium: 135 mMol/L — ABNORMAL LOW (ref 136–147)

## 2019-03-06 LAB — POCT PROTHROMBIN TIME (PT): INR POCT: 3.4 — AB (ref 2–3)

## 2019-03-06 LAB — URIC ACID: Uric acid: 9 mg/dL — ABNORMAL HIGH (ref 2.6–6.0)

## 2019-03-06 MED ORDER — ROSUVASTATIN CALCIUM 10 MG PO TABS
10.0000 mg | ORAL_TABLET | Freq: Every day | ORAL | 3 refills | Status: DC
Start: 2019-03-06 — End: 2019-08-09

## 2019-03-06 NOTE — Progress Notes (Signed)
Anticoagulation Progress Report   March 06, 2019  Name: [name], [name] [facility]    Medical Record Number: [id number]  Reason for Treatment: Atrial fibrillation unspecified     INR Range: 2.0-3.0    Date: 2019-03-06 13:42  Location: [facility]    INR: 3.40    Tablet Strength: 5 mg    Next INR: 7 days 2019-03-13    Progress Note: Patient was placed on Allopurinol and prednisone which   according to the [employer] both medications will majorly increase the INR.   I will hold tonight's dose of Coumadin and retest in 1 week.     Dosage Out: :Su-5mg :M-2.5mg :T-5mg :W-2.5mg :Th-hold:F-2.5mg :Sa-5mg :
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
## 2019-03-10 ENCOUNTER — Encounter (RURAL_HEALTH_CENTER): Payer: Self-pay

## 2019-03-10 ENCOUNTER — Telehealth (RURAL_HEALTH_CENTER): Payer: Self-pay

## 2019-03-10 NOTE — Telephone Encounter (Signed)
-----   Message from Graciela Husbands, DO sent at 03/07/2019  9:01 AM EST -----  Labs are ok

## 2019-03-10 NOTE — Telephone Encounter (Signed)
Pt has been informed,......Marland Kitchenpkt

## 2019-03-13 ENCOUNTER — Telehealth (RURAL_HEALTH_CENTER): Payer: Self-pay

## 2019-03-13 ENCOUNTER — Ambulatory Visit: Payer: Medicare Other

## 2019-03-13 DIAGNOSIS — I4821 Permanent atrial fibrillation: Secondary | ICD-10-CM

## 2019-03-13 NOTE — Progress Notes (Signed)
Anticoagulation Progress Report   March 13, 2019  Name: [name], [name] [facility]    Medical Record Number: [id number]  Reason for Treatment: Atrial fibrillation unspecified     INR Range: 2.0-3.0    Date: 2019-03-13 15:28  Location: [facility]    INR: 2.90    Tablet Strength: 5 mg    Next INR: 7 days 2019-03-20    Progress Note: Patient states that she had 1 serving of vitamin K last   week. Patient states she did not take the Vicodin last night. I advised the   patient to take her medications as prescribed and needed(pain). I will   lower the Coumadin dosage as the Vicodin increases the INR and retest in 1   week.     Dosage Out: :Su-2.5mg :M-2.5mg :T-5mg :W-2.5mg :Th-2.5mg :F-2.5mg :Sa-2.5mg :

## 2019-03-13 NOTE — Telephone Encounter (Signed)
Spoke with patient over the telephone as part of his transition care management following her recent hospitalization for    Acute on chronic diastolic congestive heart failure  Chronic atrial fibrillation  Marked cardiac enlargement, chronic  Pulmonary hypertension  Severe peripheral edema  Acute gouty arthritis both feet from diuretics  History of asthma  Chronic hypoxic respiratory failure, corrected with oxygen      Patient reports that she is feeling okay but states that she is dreading her upcoming appointment with the anti-coag Clinic. States that it is "wearing me down because I have to go every week." Patient states that she doesn't understand why her blood levels aren't stabilized. Explained to patient that some of her other medications can cause INR range to be elevated. She tells me that  Dr. Amada Jupiter had mentioned Eliquis to her has a possible alterative medication. Encouraged patient to discuss this further with her provider. States understanding.    Pt states she wears oxygen at 2 L with the exception of becoming short of breath, at which time she states that she turns it up to 2.5 L to 3 L if necessary and returns it to the 2 L as soon as the SOB subsides.     Pt states appointment with Dr Amada Jupiter on 06/11/19 . Pt states she is taking all of his medications as ordered. Encouraged patient to call or return to the ER for any worsening SOB, cough, and fevers, states understanding.

## 2019-03-20 ENCOUNTER — Ambulatory Visit: Payer: Medicare Other

## 2019-03-20 DIAGNOSIS — I4821 Permanent atrial fibrillation: Secondary | ICD-10-CM

## 2019-03-20 NOTE — Progress Notes (Signed)
Anticoagulation Progress Report   March 20, 2019  Name: [name], [name] [facility]    Medical Record Number: [id number]  Reason for Treatment: Atrial fibrillation unspecified     INR Range: 2.0-3.0    Date: 2019-03-20 16:37  [facility]: [facility]    INR: 2.70    Tablet Strength: 5 mg    Next INR: 28 days 2019-04-17    Dosage Out: :Su-2.5mg :M-2.5mg :T-5mg :W-2.5mg :Th-2.5mg :F-2.5mg :Sa-2.5mg :

## 2019-03-21 ENCOUNTER — Telehealth (RURAL_HEALTH_CENTER): Payer: Self-pay

## 2019-03-21 NOTE — Telephone Encounter (Signed)
Contacted patient today for her 3rd transitional care management weekly call.     Patient states that she is doing well. Denies shortness of breath, states that she has some mild swelling in her lower extremities. She repots that she is doing sitting with her legs propped up and it's helping.     States that she is pleased that she only has to go for INR labs monthly. She tells me that she went for her salon appointment this am and forgot her mask. Reminded patient to try to remember to keep a mask with her, to practice good hand hygiene, and to try to keep at a safe distance whenever possible. States understanding.     Denies any needs at this time. Reminded patient that I will call back on December 5th.

## 2019-03-24 ENCOUNTER — Other Ambulatory Visit (RURAL_HEALTH_CENTER): Payer: Self-pay

## 2019-03-24 ENCOUNTER — Telehealth (RURAL_HEALTH_CENTER): Payer: Self-pay | Admitting: Internal Medicine

## 2019-03-24 MED ORDER — HYDROCODONE-ACETAMINOPHEN 5-325 MG PO TABS
1.0000 | ORAL_TABLET | Freq: Four times a day (QID) | ORAL | 0 refills | Status: DC | PRN
Start: 2019-03-24 — End: 2019-04-17

## 2019-03-24 NOTE — Telephone Encounter (Signed)
Ms. Lamanna would like a refill on her hydrocodone. [phone]. Can leave message. [facility]

## 2019-03-31 ENCOUNTER — Other Ambulatory Visit (RURAL_HEALTH_CENTER): Payer: Self-pay | Admitting: Internal Medicine

## 2019-04-04 ENCOUNTER — Telehealth (RURAL_HEALTH_CENTER): Payer: Self-pay

## 2019-04-04 NOTE — Telephone Encounter (Signed)
Attempting to contact patient for her 4th transitional call, no answer, message left.

## 2019-04-11 ENCOUNTER — Inpatient Hospital Stay
Admission: EM | Admit: 2019-04-11 | Discharge: 2019-04-13 | DRG: 292 | Disposition: A | Discharge status: Home / Self Care | Payer: Medicare Other | Attending: Internal Medicine | Admitting: Internal Medicine

## 2019-04-11 ENCOUNTER — Emergency Department: Payer: Medicare Other

## 2019-04-11 DIAGNOSIS — Z20828 Contact with and (suspected) exposure to other viral communicable diseases: Secondary | ICD-10-CM | POA: Diagnosis present

## 2019-04-11 DIAGNOSIS — Z7951 Long term (current) use of inhaled steroids: Secondary | ICD-10-CM

## 2019-04-11 DIAGNOSIS — M109 Gout, unspecified: Secondary | ICD-10-CM | POA: Diagnosis present

## 2019-04-11 DIAGNOSIS — I5033 Acute on chronic diastolic (congestive) heart failure: Secondary | ICD-10-CM | POA: Diagnosis present

## 2019-04-11 DIAGNOSIS — I719 Aortic aneurysm of unspecified site, without rupture: Secondary | ICD-10-CM | POA: Diagnosis present

## 2019-04-11 DIAGNOSIS — I110 Hypertensive heart disease with heart failure: Principal | ICD-10-CM | POA: Diagnosis present

## 2019-04-11 DIAGNOSIS — I21A1 Myocardial infarction type 2: Secondary | ICD-10-CM

## 2019-04-11 DIAGNOSIS — Z885 Allergy status to narcotic agent status: Secondary | ICD-10-CM

## 2019-04-11 DIAGNOSIS — Z882 Allergy status to sulfonamides status: Secondary | ICD-10-CM

## 2019-04-11 DIAGNOSIS — Z9109 Other allergy status, other than to drugs and biological substances: Secondary | ICD-10-CM

## 2019-04-11 DIAGNOSIS — Z79891 Long term (current) use of opiate analgesic: Secondary | ICD-10-CM

## 2019-04-11 DIAGNOSIS — Z803 Family history of malignant neoplasm of breast: Secondary | ICD-10-CM

## 2019-04-11 DIAGNOSIS — Z66 Do not resuscitate: Secondary | ICD-10-CM

## 2019-04-11 DIAGNOSIS — K219 Gastro-esophageal reflux disease without esophagitis: Secondary | ICD-10-CM | POA: Diagnosis present

## 2019-04-11 DIAGNOSIS — Z950 Presence of cardiac pacemaker: Secondary | ICD-10-CM

## 2019-04-11 DIAGNOSIS — Z90710 Acquired absence of both cervix and uterus: Secondary | ICD-10-CM

## 2019-04-11 DIAGNOSIS — M1611 Unilateral primary osteoarthritis, right hip: Secondary | ICD-10-CM | POA: Diagnosis present

## 2019-04-11 DIAGNOSIS — I4821 Permanent atrial fibrillation: Secondary | ICD-10-CM

## 2019-04-11 DIAGNOSIS — J9611 Chronic respiratory failure with hypoxia: Secondary | ICD-10-CM | POA: Diagnosis present

## 2019-04-11 DIAGNOSIS — I495 Sick sinus syndrome: Secondary | ICD-10-CM | POA: Diagnosis present

## 2019-04-11 DIAGNOSIS — Z7952 Long term (current) use of systemic steroids: Secondary | ICD-10-CM

## 2019-04-11 DIAGNOSIS — J449 Chronic obstructive pulmonary disease, unspecified: Secondary | ICD-10-CM | POA: Diagnosis present

## 2019-04-11 DIAGNOSIS — R0789 Other chest pain: Secondary | ICD-10-CM | POA: Diagnosis present

## 2019-04-11 DIAGNOSIS — Z888 Allergy status to other drugs, medicaments and biological substances status: Secondary | ICD-10-CM

## 2019-04-11 DIAGNOSIS — Z7901 Long term (current) use of anticoagulants: Secondary | ICD-10-CM

## 2019-04-11 DIAGNOSIS — Z9981 Dependence on supplemental oxygen: Secondary | ICD-10-CM

## 2019-04-11 DIAGNOSIS — I509 Heart failure, unspecified: Secondary | ICD-10-CM | POA: Diagnosis present

## 2019-04-11 DIAGNOSIS — I2510 Atherosclerotic heart disease of native coronary artery without angina pectoris: Secondary | ICD-10-CM | POA: Diagnosis present

## 2019-04-11 LAB — CBC AND DIFFERENTIAL
Basophils Absolute: 0 10*3/uL (ref 0.0–0.3)
Eosinophils Absolute: 0 10*3/uL (ref 0.0–0.8)
Hematocrit: 36.3 % (ref 36.0–48.0)
Hemoglobin: 11.6 gm/dL — ABNORMAL LOW (ref 12.0–16.0)
Lymphocytes Absolute: 0.7 10*3/uL (ref 0.6–5.1)
Lymphocytes: 7 % — ABNORMAL LOW (ref 15.0–46.0)
MCH: 30 pg (ref 28–35)
MCHC: 32 gm/dL (ref 31–36)
MCV: 94 fL (ref 80–100)
MPV: 7.1 fL (ref 6.0–10.0)
Monocytes Absolute: 0.3 10*3/uL (ref 0.1–1.7)
Monocytes: 3 % (ref 3.0–15.0)
Neutrophils %: 90 % — ABNORMAL HIGH (ref 42.0–78.0)
Neutrophils Absolute: 9.5 10*3/uL — ABNORMAL HIGH (ref 1.7–8.6)
PLT CT: 160 10*3/uL (ref 130–440)
RBC: 3.86 10*6/uL (ref 3.80–5.00)
RDW: 14.2 % (ref 10.5–14.5)
WBC: 10.6 10*3/uL (ref 4.0–11.0)

## 2019-04-11 LAB — COMPREHENSIVE METABOLIC PANEL
ALT: 27 U/L (ref 0–55)
AST (SGOT): 30 U/L (ref 10–42)
Albumin/Globulin Ratio: 1.91 Ratio (ref 0.80–2.00)
Albumin: 4.4 gm/dL (ref 3.5–5.0)
Alkaline Phosphatase: 133 U/L (ref 40–145)
Anion Gap: 15.6 mMol/L (ref 7.0–18.0)
BUN / Creatinine Ratio: 33 Ratio — ABNORMAL HIGH (ref 10.0–30.0)
BUN: 34 mg/dL — ABNORMAL HIGH (ref 7–22)
Bilirubin, Total: 0.8 mg/dL (ref 0.1–1.2)
CO2: 29 mMol/L (ref 20.0–30.0)
Calcium: 10.8 mg/dL — ABNORMAL HIGH (ref 8.5–10.5)
Chloride: 99 mMol/L (ref 98–110)
Creatinine: 1.03 mg/dL (ref 0.60–1.20)
EGFR: 51 mL/min/{1.73_m2} — ABNORMAL LOW (ref 60–150)
Globulin: 2.3 gm/dL (ref 2.0–4.0)
Glucose: 120 mg/dL — ABNORMAL HIGH (ref 71–99)
Osmolality Calculated: 286 mOsm/kg (ref 275–300)
Potassium: 4.6 mMol/L (ref 3.5–5.3)
Protein, Total: 6.7 gm/dL (ref 6.0–8.3)
Sodium: 139 mMol/L (ref 136–147)

## 2019-04-11 LAB — VH APTIMA SARS-COV-2 ASSAY (PANTHER SYSTEM)(TM)
Aptima SARS-CoV-2: NEGATIVE
Date of Onset: 20201210
Does patient reside in a congregate care setting?: NEGATIVE
Is patient employed in a healthcare setting?: NEGATIVE
Is the patient pregnant?: NEGATIVE

## 2019-04-11 LAB — VH ARTERIAL BLOOD GAS
Allen's Test: POSITIVE
Base Excess, Arterial: 5.9 mMol/L — ABNORMAL HIGH (ref <=2.0)
Carboxyhemoglobin: <1 % (ref 0.0–19.9)
HCO3, ABG: 30.1 mMol/L — ABNORMAL HIGH (ref 20.0–29.0)
O2 Lit Per Min: 2 L/min
O2 Sat, Arterial: 97.2 % (ref 96.0–100.0)
pCO2, Arterial: 43.1 mm Hg (ref 35–45)
pH, Arterial: 7.46 pH — ABNORMAL HIGH (ref 7.35–7.45)
pO2, Arterial: 103 mm Hg — ABNORMAL HIGH (ref 75–100)

## 2019-04-11 LAB — PT/INR
PT INR: 1.9 — ABNORMAL HIGH (ref 0.9–1.2)
PT: 19.4 s — ABNORMAL HIGH (ref 9.4–11.3)

## 2019-04-11 LAB — B-TYPE NATRIURETIC PEPTIDE: B-Natriuretic Peptide: 198.9 pg/mL — ABNORMAL HIGH (ref 0.0–100.0)

## 2019-04-11 LAB — VH INFLUENZA A/B RAPID TEST
Influenza A: NEGATIVE
Influenza B: NEGATIVE

## 2019-04-11 LAB — TROPONIN I: Troponin I: 0.07 ng/mL — ABNORMAL HIGH (ref 0.00–0.02)

## 2019-04-11 MED ORDER — FUROSEMIDE 10 MG/ML IJ SOLN
40.00 mg | Freq: Two times a day (BID) | INTRAMUSCULAR | Status: DC
Start: 2019-04-11 — End: 2019-04-12
  Administered 2019-04-12 (×2): 40 mg via INTRAVENOUS
  Filled 2019-04-11 (×2): qty 4

## 2019-04-11 MED ORDER — FUROSEMIDE 10 MG/ML IJ SOLN
INTRAMUSCULAR | Status: AC
Start: 2019-04-11 — End: ?
  Filled 2019-04-11: qty 8

## 2019-04-11 MED ORDER — VH WARFARIN THERAPY PLACEHOLDER
1.00 | Status: DC
Start: 2019-04-11 — End: 2019-04-11

## 2019-04-11 MED ORDER — FLUOROMETHOLONE 0.1 % OP SUSP
1.00 [drp] | Freq: Four times a day (QID) | OPHTHALMIC | Status: DC
Start: 2019-04-11 — End: 2019-04-12
  Administered 2019-04-11 – 2019-04-12 (×4): 1 [drp] via OPHTHALMIC
  Filled 2019-04-11: qty 5

## 2019-04-11 MED ORDER — LIDOCAINE 5 % EX PTCH
1.00 | MEDICATED_PATCH | CUTANEOUS | Status: DC
Start: 2019-04-11 — End: 2019-04-13
  Administered 2019-04-11: 17:00:00 1 via TRANSDERMAL
  Filled 2019-04-11 (×2): qty 1

## 2019-04-11 MED ORDER — ACETAMINOPHEN 160 MG/5ML PO SOLN
650.00 mg | ORAL | Status: DC | PRN
Start: 2019-04-11 — End: 2019-04-11

## 2019-04-11 MED ORDER — ALBUTEROL SULFATE (2.5 MG/3ML) 0.083% IN NEBU
2.50 mg | INHALATION_SOLUTION | Freq: Four times a day (QID) | RESPIRATORY_TRACT | Status: DC | PRN
Start: 2019-04-11 — End: 2019-04-13

## 2019-04-11 MED ORDER — VH WARFARIN THERAPY PLACEHOLDER
1.00 | Status: DC
Start: 2019-04-11 — End: 2019-04-13

## 2019-04-11 MED ORDER — CARBAMAZEPINE 200 MG PO TABS
100.0000 mg | ORAL_TABLET | Freq: Two times a day (BID) | ORAL | Status: DC
Start: 2019-04-11 — End: 2019-04-11

## 2019-04-11 MED ORDER — VH SPIRONOLACTONE 25 MG PO TABS
25.0000 mg | ORAL_TABLET | Freq: Every day | ORAL | Status: DC
Start: 2019-04-11 — End: 2019-04-13
  Administered 2019-04-12 – 2019-04-13 (×2): 25 mg via ORAL
  Filled 2019-04-11 (×2): qty 1

## 2019-04-11 MED ORDER — ROSUVASTATIN CALCIUM 10 MG PO TABS
10.0000 mg | ORAL_TABLET | Freq: Every day | ORAL | Status: DC
Start: 2019-04-11 — End: 2019-04-13
  Administered 2019-04-12 – 2019-04-13 (×2): 10 mg via ORAL
  Filled 2019-04-11 (×2): qty 1

## 2019-04-11 MED ORDER — FUROSEMIDE 40 MG PO TABS
40.0000 mg | ORAL_TABLET | Freq: Every day | ORAL | Status: DC
Start: 2019-04-11 — End: 2019-04-11

## 2019-04-11 MED ORDER — CLOTRIMAZOLE 1 % EX CREA
TOPICAL_CREAM | Freq: Two times a day (BID) | CUTANEOUS | Status: DC
Start: 2019-04-11 — End: 2019-04-12
  Filled 2019-04-11: qty 14.17

## 2019-04-11 MED ORDER — ATENOLOL 50 MG PO TABS
50.0000 mg | ORAL_TABLET | Freq: Every day | ORAL | Status: DC
Start: 2019-04-11 — End: 2019-04-13
  Administered 2019-04-12 – 2019-04-13 (×2): 50 mg via ORAL
  Filled 2019-04-11 (×2): qty 1

## 2019-04-11 MED ORDER — ALLOPURINOL 100 MG PO TABS
100.0000 mg | ORAL_TABLET | Freq: Every day | ORAL | Status: DC
Start: 2019-04-11 — End: 2019-04-13
  Administered 2019-04-12 – 2019-04-13 (×2): 100 mg via ORAL
  Filled 2019-04-11 (×2): qty 1

## 2019-04-11 MED ORDER — FLUTICASONE FUROATE-VILANTEROL 100-25 MCG/INH IN AEPB
1.00 | INHALATION_SPRAY | Freq: Every morning | RESPIRATORY_TRACT | Status: DC
Start: 2019-04-11 — End: 2019-04-13
  Administered 2019-04-12 – 2019-04-13 (×2): 1 via RESPIRATORY_TRACT
  Filled 2019-04-11: qty 14

## 2019-04-11 MED ORDER — ACETAMINOPHEN 650 MG RE SUPP
650.00 mg | RECTAL | Status: DC | PRN
Start: 2019-04-11 — End: 2019-04-13

## 2019-04-11 MED ORDER — WARFARIN SODIUM 3 MG PO TABS
3.0000 mg | ORAL_TABLET | Freq: Every day | ORAL | Status: DC
Start: 2019-04-11 — End: 2019-04-13
  Administered 2019-04-11 – 2019-04-12 (×2): 3 mg via ORAL
  Filled 2019-04-11 (×2): qty 1

## 2019-04-11 MED ORDER — FUROSEMIDE 10 MG/ML IJ SOLN
80.00 mg | Freq: Once | INTRAMUSCULAR | Status: AC
Start: 2019-04-11 — End: 2019-04-11
  Administered 2019-04-11: 12:00:00 80 mg via INTRAVENOUS

## 2019-04-11 MED ORDER — ACETAMINOPHEN 325 MG PO TABS
650.0000 mg | ORAL_TABLET | ORAL | Status: DC | PRN
Start: 2019-04-11 — End: 2019-04-13

## 2019-04-11 MED ORDER — TIOTROPIUM BROMIDE MONOHYDRATE 2.5 MCG/ACT IN AERS
2.00 | INHALATION_SPRAY | Freq: Every morning | RESPIRATORY_TRACT | Status: DC
Start: 2019-04-11 — End: 2019-04-13
  Administered 2019-04-12 – 2019-04-13 (×2): 2 via RESPIRATORY_TRACT
  Filled 2019-04-11: qty 1

## 2019-04-11 MED ORDER — VH POTASSIUM CHLORIDE CRYS ER 10 MEQ PO TBCR (WRAP)
10.00 meq | EXTENDED_RELEASE_TABLET | Freq: Every day | ORAL | Status: DC
Start: 2019-04-11 — End: 2019-04-13
  Administered 2019-04-12 – 2019-04-13 (×2): 10 meq via ORAL
  Filled 2019-04-11 (×2): qty 1

## 2019-04-11 MED ORDER — VH DILTIAZEM HCL ER BEADS 120 MG PO CP24 (WRAP)
240.00 mg | ORAL_CAPSULE | Freq: Two times a day (BID) | ORAL | Status: DC
Start: 2019-04-11 — End: 2019-04-13
  Administered 2019-04-11 – 2019-04-13 (×4): 240 mg via ORAL
  Filled 2019-04-11 (×4): qty 2

## 2019-04-11 MED ORDER — IBUPROFEN 200 MG PO TABS
400.0000 mg | ORAL_TABLET | Freq: Four times a day (QID) | ORAL | Status: DC | PRN
Start: 2019-04-11 — End: 2019-04-13

## 2019-04-11 MED ORDER — LATANOPROST 0.005 % OP SOLN
1.00 [drp] | Freq: Every evening | OPHTHALMIC | Status: DC
Start: 2019-04-11 — End: 2019-04-13
  Administered 2019-04-11 – 2019-04-12 (×2): 1 [drp] via OPHTHALMIC
  Filled 2019-04-11: qty 1

## 2019-04-11 MED ORDER — METOLAZONE 5 MG PO TABS
10.00 mg | ORAL_TABLET | Freq: Once | ORAL | Status: AC
Start: 2019-04-11 — End: 2019-04-11
  Administered 2019-04-11: 12:00:00 10 mg via ORAL
  Filled 2019-04-11: qty 2

## 2019-04-11 MED ORDER — SODIUM CHLORIDE (PF) 0.9 % IJ SOLN
3.00 mL | Freq: Three times a day (TID) | INTRAMUSCULAR | Status: DC
Start: 2019-04-11 — End: 2019-04-13
  Administered 2019-04-11 – 2019-04-13 (×3): 3 mL via INTRAVENOUS
  Filled 2019-04-11: qty 10

## 2019-04-11 MED ORDER — ACETAMINOPHEN 160 MG/5ML PO SOLN
650.00 mg | ORAL | Status: DC | PRN
Start: 2019-04-11 — End: 2019-04-13

## 2019-04-11 MED ORDER — PREDNISONE 10 MG PO TABS
10.0000 mg | ORAL_TABLET | Freq: Every day | ORAL | Status: DC
Start: 2019-04-11 — End: 2019-04-13
  Administered 2019-04-12 – 2019-04-13 (×2): 10 mg via ORAL
  Filled 2019-04-11 (×2): qty 1

## 2019-04-11 MED ORDER — HYDROCODONE-ACETAMINOPHEN 5-325 MG PO TABS
1.0000 | ORAL_TABLET | Freq: Four times a day (QID) | ORAL | Status: DC | PRN
Start: 2019-04-11 — End: 2019-04-13
  Administered 2019-04-11 – 2019-04-13 (×6): 1 via ORAL
  Filled 2019-04-11 (×6): qty 1

## 2019-04-11 MED ORDER — ACETAMINOPHEN 325 MG PO TABS
650.0000 mg | ORAL_TABLET | ORAL | Status: DC | PRN
Start: 2019-04-11 — End: 2019-04-11

## 2019-04-11 MED ORDER — SODIUM CHLORIDE (PF) 0.9 % IJ SOLN
3.00 mL | Freq: Three times a day (TID) | INTRAMUSCULAR | Status: DC
Start: 2019-04-11 — End: 2019-04-11

## 2019-04-11 MED ORDER — SODIUM CHLORIDE (PF) 0.9 % IJ SOLN
0.40 mg | INTRAMUSCULAR | Status: DC | PRN
Start: 2019-04-11 — End: 2019-04-11

## 2019-04-11 MED ORDER — DILTIAZEM HCL 5 MG/ML IV SOLN (WRAP)
10.00 mg | Freq: Once | INTRAVENOUS | Status: AC
Start: 2019-04-11 — End: 2019-04-11
  Administered 2019-04-11: 13:00:00 10 mg via INTRAVENOUS

## 2019-04-11 MED ORDER — DILTIAZEM HCL 5 MG/ML IV SOLN (WRAP)
INTRAVENOUS | Status: AC
Start: 2019-04-11 — End: ?
  Filled 2019-04-11: qty 5

## 2019-04-11 MED ORDER — ACETAMINOPHEN 650 MG RE SUPP
650.00 mg | RECTAL | Status: DC | PRN
Start: 2019-04-11 — End: 2019-04-11

## 2019-04-11 NOTE — H&P (Signed)
Medicine History & Physical   [facility]  [employer]   Patient Name: [name],[name]: 1 days   Attending Physician: [name], MD PCP: Graciela Husbands, DO      Assessment and Plan:                                                                #Shortness of breath lower extremity edema most likely secondary to CHF exacerbation acute on chronic diastolic type  -Lasix 40 mg IV twice daily  -BMP tomorrow  -Strict INO's  -Continue metaxalone  -Continue spironolactone      #History of COPD with chronic hypoxic respiratory failure on home oxygen 24/7  -Continue Spiriva  -Prednisone 10 mg daily  -Breo      #Chest pain most likely chest wall pain  -The pain is reproducible with chest palpation  -I would proceed with lidocaine patch    #Right hip pain mostly with ambulation  -Possibly secondary to osteoarthritis given her age    #Atrial fibrillation  -The rate is controlled  -Continue warfarin  -Continue diltiazem    #History of gout disease  -Continue allopurinol    #History of coronary artery disease  -Continue atenolol         DVT PPx: Anticoagulated   Dispo: Inpatient  Healthcare Proxy:   Code: full code     History of Presenting Illness                                CC: Chest pain, shortness of breath right hip pain  [name] is a 78 y.o. female patient, very fragile, with a past medical history significant for A. fib on anticoagulation, coronary artery disease, diastolic heart failure grade 2, COPD with chronic hypoxic respiratory failure, hypertension, sick sinus syndrome status post pacemaker presented to the hospital with a 1 week history of worsening shortness of breath and heaviness of both lower extremity.  She stated she has lower extremity edema since her last discharge from hospital and has been getting worse recently .     Moreover she complains of chest pain most likely musculoskeletal keeping it is reproducible with chest palpation.  Her EKG did not show any  evidence of acute ischemic event.  Troponin is within normal limit I would use lidocaine patch  She complains of right hip pain with ambulation most likely secondary to osteoarthritis I would initiate her on Motrin as needed    Past Medical History:   Diagnosis Date   . Aortic aneurysm    . Atrial fibrillation    . Chronic obstructive pulmonary disease    . Coronary artery disease    . Gastroesophageal reflux disease    . Hypertension    . Malignant neoplasm    . Pacemaker    . SSS (sick sinus syndrome)      Past Surgical History:   Procedure Laterality Date   . CARDIAC PACEMAKER PLACEMENT      2002   . HYSTERECTOMY         Family History   Problem Relation Age of Onset   . Breast cancer Sister      Social History  Tobacco Use   . Smoking status: Never Smoker   . Smokeless tobacco: Never Used   Substance Use Topics   . Alcohol use: No   . Drug use: No       Subjective   Review of Systems:  All systems were reviewed and are negative unless pertinent positive stated in HPI.  CONSTITUTIONAL: No night sweats. No fatigue. No fever or chills.   Eyes: No visual changes. No eye pain.   ENT: No runny nose. No epistaxis.   RESPIRATORY: No cough. No hemoptysis. No shortness of breath.   CARDIOVASCULAR: No chest pains. No palpitations.   GASTROINTESTINAL: No abdominal pain. No vomiting. No diarrhea or constipation.   GENITOURINARY: No urgency. No frequency. No dysuria.   MUSCULOSKELETAL: No musculoskeletal pain. No joint swelling.   NEUROLOGICAL: No headache or neck pain. No syncope or seizure.   PSYCHIATRIC: No depression, no psychosis.  SKIN: No rashes. No lesions. No petechiae.  ENDOCRINE: No unexplained weight loss. No polydipsia.   HEMATOLOGIC: No anemia. No purpura. No bleeding.   ALLERGIC AND IMMUNOLOGIC: No pruritus. No swelling.         Objective   Physical Exam:     Vitals: T:98.2 F (36.8 C) (Tympanic),  BP:130/77, HR:77, RR:15, SaO2:99%    1) General Appearance: Alert and oriented x 4. In no acute distress.   2)  Eyes: Pink conjunctiva, anicteric sclera. Pupils are equally reactive to light.  3) ENT: Oral mucosa moist with no pharyngeal congestion, erythema or swelling.  4) Neck: Supple, with full range of motion. Trachea is central, no JVD noted  5) Chest: Clear to auscultation bilaterally, no wheezes or rhonchi.  6) CVS: normal rate and regular rhythm, with no murmurs.  7) Abdomen: Soft, non-tender, no palpable mass. Bowel sounds normal. No CVA tenderness  8) Extremities: No pitting edema, pulses palpable, no calf swelling and no gross deformity.  9) Skin: Warm, dry with normal skin turgor, no rash  10) Lymphatics: No lymphadenopathy in axillary, cervical and inguinal area.   11) Neurological: Cranial nerves II-XII intact. No gross focal motor or sensory deficits noted.  12) Psychiatric: Affect is appropriate. No hallucinations.    EKG: Per my review shows   Chest Xray: Per my review shows     Patient Vitals for the past 12 hrs:   BP Temp Pulse Resp   04/11/19 1347 130/77 - 77 15   04/11/19 1316 155/78 - 84 (!) 23   04/11/19 1302 165/79 - (!) 121 (!) 23   04/11/19 1247 141/69 - 96 (!) 26   04/11/19 1232 132/80 - 87 22   04/11/19 1216 138/68 - 93 22   04/11/19 1201 146/78 - (!) 109 (!) 28   04/11/19 1158 156/76 98.2 F (36.8 C) (!) 116 22     Weight Monitoring 02/13/2019 02/27/2019 02/27/2019 02/28/2019 03/01/2019 03/06/2019 04/11/2019   Height 165.1 cm 165.1 cm 165.1 cm - - 165.1 cm 165.1 cm   Height Method - - - - - - Stated   Weight 77.837 kg 79.289 kg 79.2 kg 74.7 kg 73.9 kg 75.297 kg 76.658 kg   Weight Method - - - Standing Scale Standing Scale - Stated   BMI (calculated) 28.6 kg/m2 29.1 kg/m2 29.1 kg/m2 - - 27.7 kg/m2 28.2 kg/m2          Recent Results (from the past 24 hour(s))   CBC and differential    Collection Time: 04/11/19 11:45 AM   Result Value Ref Range  WBC 10.6 4.0 - 11.0 K/cmm    RBC 3.86 3.80 - 5.00 M/cmm    Hemoglobin 11.6 (L) 12.0 - 16.0 gm/dL    Hematocrit 96.0 45.4 - 48.0 %    MCV 94 80 - 100 fL     MCH 30 28 - 35 pg    MCHC 32 31 - 36 gm/dL    RDW 09.8 11.9 - 14.7 %    PLT CT 160 130 - 440 K/cmm    MPV 7.1 6.0 - 10.0 fL    Neutrophils % 90.0 (H) 42.0 - 78.0 %    Lymphocytes 7.0 (L) 15.0 - 46.0 %    Monocytes 3.0 3.0 - 15.0 %    Neutrophils Absolute 9.5 (H) 1.7 - 8.6 K/cmm    Lymphocytes Absolute 0.7 0.6 - 5.1 K/cmm    Monocytes Absolute 0.3 0.1 - 1.7 K/cmm    Eosinophils Absolute 0.0 0.0 - 0.8 K/cmm    Basophils Absolute 0.0 0.0 - 0.3 K/cmm    RBC Morphology RBC Morphology Reviewed     Elliptocytes 1+     Poikilocytosis 1+    Comprehensive metabolic panel    Collection Time: 04/11/19 11:45 AM   Result Value Ref Range    Sodium 139 136 - 147 mMol/L    Potassium 4.6 3.5 - 5.3 mMol/L    Chloride 99 98 - 110 mMol/L    CO2 29.0 20.0 - 30.0 mMol/L    Calcium 10.8 (H) 8.5 - 10.5 mg/dL    Glucose 829 (H) 71 - 99 mg/dL    Creatinine 5.62 1.30 - 1.20 mg/dL    BUN 34 (H) 7 - 22 mg/dL    Protein, Total 6.7 6.0 - 8.3 gm/dL    Albumin 4.4 3.5 - 5.0 gm/dL    Alkaline Phosphatase 133 40 - 145 U/L    ALT 27 0 - 55 U/L    AST (SGOT) 30 10 - 42 U/L    Bilirubin, Total 0.8 0.1 - 1.2 mg/dL    Albumin/Globulin Ratio 1.91 0.80 - 2.00 Ratio    Anion Gap 15.6 7.0 - 18.0 mMol/L    BUN / Creatinine Ratio 33.0 (H) 10.0 - 30.0 Ratio    EGFR 51 (L) 60 - 150 mL/min/1.85m2    Osmolality Calculated 286 275 - 300 mOsm/kg    Globulin 2.3 2.0 - 4.0 gm/dL   B-type Natriuretic Peptide    Collection Time: 04/11/19 11:45 AM   Result Value Ref Range    B-Natriuretic Peptide 198.9 (H) 0.0 - 100.0 pg/mL   Troponin I    Collection Time: 04/11/19 11:45 AM   Result Value Ref Range    Troponin I 0.07 (H) 0.00 - 0.02 ng/mL   Prothrombin time/INR    Collection Time: 04/11/19 11:45 AM   Result Value Ref Range    PT 19.4 (H) 9.4 - 11.3 sec    PT INR 1.9 (H) 0.9 - 1.2   ECG 12 lead (Stat)    Collection Time: 04/11/19 12:39 PM   Result Value Ref Range    Patient Age 78 years    Patient DOB 07-Mar-1941     Patient Height      Patient Weight      Interpretation  Text       Atrial fibrillation  Borderline T abnormalities, inferior leads  Compared to ECG 02/27/2019 16:30:39  T-wave abnormality now present  ST (T wave) deviation no longer present      Physician Interpreter  Ventricular Rate 85 //min    QRS Duration 102 ms    P-R Interval  ms    Q-T Interval 397 ms    Q-T Interval(Corrected) 472 ms    P Wave Axis  deg    QRS Axis 4 deg    T Axis 1 years   Influenza A / B Rapid Test    Collection Time: 04/11/19 12:45 PM    Specimen: Nasal Wash   Result Value Ref Range    Influenza A Negative Negative    Influenza B Negative Negative   Arterial Blood Gas    Collection Time: 04/11/19  1:10 PM   Result Value Ref Range    Allen's Test Positive     Draw Site - ABG Right Radial Artery     O2 Lit Per Min 2 L/min    VIA NC     pH, Arterial 7.46 (H) 7.35 - 7.45 pH    pCO2, Arterial 43.1 35 - 45 mm Hg    pO2, Arterial 103 (H) 75 - 100 mm Hg    O2 Sat, Arterial 97.2 96.0 - 100.0 %    HCO3, ABG 30.1 (H) 20.0 - 29.0 mMol/L    Base Excess, Arterial 5.9 (H) -2.0 - 2.0 mMol/L    Carboxyhemoglobin <1.0 0.0 - 19.9 %        Allergies   Allergen Reactions   . Adhesive [Wound Dressing Adhesive]    . Codeine Hives     unknown     . Fentanyl      Other reaction(s): psychological reaction  Hallucinations,    . Morphine    . Morphine And Related Hives     unknown     . Nitroglycerin      Other reaction(s): neurological reaction   . Pain Patch [Menthol]    . Sulfa Antibiotics      unknown      Xr Chest Ap Portable    Result Date: 04/11/2019  Stable left cardiac pacer. No pneumothorax. Stable cardiomegaly and aneurysmal thoracic aortic appearance. Elevated left hemidiaphragm. Small left effusion. Mild left basilar atelectasis. Stable pulmonary vascularity. ReadingStation:[id number]     Home Medications     Med List Status: Complete Set By: Brown Human, RN at 04/11/2019 12:03 PM                albuterol (PROVENTIL) (2.5 MG/3ML) 0.083% nebulizer solution     Take 2.5 mg by nebulization every 6 (six)  hours as needed for Wheezing.     allopurinol (ZYLOPRIM) 100 MG tablet     Take 1 tablet (100 mg total) by mouth daily For gout prevention     atenolol (TENORMIN) 50 MG tablet     TAKE 1 TABLET BY MOUTH EVERY DAY     carBAMazepine (TEGretol  XR) 100 MG 12 hr tablet     Take 1 tablet (100 mg total) by mouth daily as needed ("nerves")     diltiazem (TIAZAC) 240 MG 24 hr capsule     Take 240 mg by mouth 2 (two) times daily.        fluorometholone (EFLONE) 0.1 % ophthalmic suspension     Place 1 drop into the left eye 4 (four) times daily        fluticasone furoate-vilanterol (BREO ELLIPTA) 100-25 MCG/INH Aerosol Pwdr, Breath Activated     Inhale 1 puff into the lungs daily     furosemide (LASIX) 40 MG tablet     Take 1  tablet (40 mg total) by mouth daily     HYDROcodone-acetaminophen (NORCO) 5-325 MG per tablet     Take 1 tablet by mouth every 6 (six) hours as needed for Pain     ketoconazole (NIZORAL) 2 % cream     Apply topically daily.     latanoprost (XALATAN) 0.005 % ophthalmic solution     Place 1 drop into both eyes nightly        potassium chloride (K-DUR) 10 MEQ tablet     Take 1 tablet (10 mEq total) by mouth daily     predniSONE (DELTASONE) 10 MG tablet     Take 4,3,2, and then stay on 1 tablet per day     rosuvastatin (CRESTOR) 10 MG tablet     Take 1 tablet (10 mg total) by mouth daily     spironolactone (ALDACTONE) 25 MG tablet     Take 1 tablet (25 mg total) by mouth daily     tiotropium (SPIRIVA RESPIMAT) 2.5 MCG/ACT inhalation spray     Inhale 2 puffs into the lungs daily     warfarin (COUMADIN) 3 MG tablet     Take 1 tablet (3 mg total) by mouth daily.     Patient taking differently: Take 2.5 mg by mouth daily 5mg  Wed and Thurs, and 2.5mg  the other days of the week.           Meds given in the ED:  Medications   albuterol (PROVENTIL) (2.5 MG/3ML) 0.083% nebulizer solution 2.5 mg (has no administration in time range)   allopurinol (ZYLOPRIM) tablet 100 mg (has no administration in time range)    atenolol (TENORMIN) tablet 50 mg (has no administration in time range)   dilTIAZem (CARDIZEM CD) 24 hr capsule 240 mg (has no administration in time range)   fluorometholone (FML) 0.1 % ophthalmic suspension 1 drop (has no administration in time range)   fluticasone furoate-vilanterol (BREO ELLIPTA) 100-25 MCG/INH 1 puff ( Inhalation Canceled Entry 04/11/19 1548)   furosemide (LASIX) tablet 40 mg (has no administration in time range)   HYDROcodone-acetaminophen (NORCO) 5-325 MG per tablet 1 tablet (has no administration in time range)   clotrimazole (LOTRIMIN) 1 % cream (has no administration in time range)   latanoprost (XALATAN) 0.005 % ophthalmic solution 1 drop (has no administration in time range)   potassium chloride (K-DUR,KLOR-CON) CR tablet 10 mEq (has no administration in time range)   predniSONE (DELTASONE) tablet 10 mg (has no administration in time range)   spironolactone (ALDACTONE) tablet 25 mg (has no administration in time range)   rosuvastatin (CRESTOR) tablet 10 mg (has no administration in time range)   tiotropium (SPIRIVA RESPIMAT) 2.5 MCG/ACT inhalation spray 2 puff ( Inhalation Canceled Entry 04/11/19 1548)   warfarin (COUMADIN) tablet 3 mg (has no administration in time range)   furosemide (LASIX) injection 40 mg (has no administration in time range)   sodium chloride (PF) 0.9 % injection 3 mL (has no administration in time range)   sodium chloride (PF) 0.9 % injection 3 mL (has no administration in time range)   acetaminophen (TYLENOL) tablet 650 mg (has no administration in time range)     Or   acetaminophen (TYLENOL) 160 MG/5ML oral solution 650 mg (has no administration in time range)     Or   acetaminophen (TYLENOL) suppository 650 mg (has no administration in time range)   furosemide (LASIX) injection 80 mg (80 mg Intravenous Given 04/11/19 1208)   metOLazone (ZAROXOLYN) tablet 10  mg (10 mg Oral Given 04/11/19 1209)   dilTIAZem (CARDIZEM) injection 10 mg (10 mg Intravenous Given  04/11/19 1300)   furosemide (LASIX) 10 MG/ML injection (has no administration in time range)   dilTIAZem (CARDIZEM) 5 MG/ML injection (has no administration in time range)      Time Spent:     [name], MD     04/11/19,4:07 PM   MRN: [id number]                                      CSN: [id number] DOB: 1940-08-01

## 2019-04-11 NOTE — ED Provider Notes (Signed)
History     Chief Complaint   Patient presents with   . Shortness of Breath     The patient is a very pleasant 78 YO F w/ PMH chronic diastolic CHF, chronic respiratory failure with hypoxia on HOT, permanent atrial fibrillation on warfarin OAC, SSS s/p PPM, HTN, GERD who presents to the ER c/o several day h/o increasing SOB esp w/ exertion and worsening orthopnea and BLE edema c/w her previous episodes of CHF exacerbation. No F/chills. No N/V/diaphoresis. She has some CP whenever she gets short winded but it is sharp, anterior, nonradiating. She has been compliant w/ her warfarin OAC making probability of PE very unlikely. She reports no other complaints.          Past Medical History:   Diagnosis Date   . Aortic aneurysm    . Atrial fibrillation    . Chronic obstructive pulmonary disease    . Coronary artery disease    . Gastroesophageal reflux disease    . Hypertension    . Malignant neoplasm    . Pacemaker    . SSS (sick sinus syndrome)        Past Surgical History:   Procedure Laterality Date   . CARDIAC PACEMAKER PLACEMENT      2002   . HYSTERECTOMY         Family History   Problem Relation Age of Onset   . Breast cancer Sister        Social  Social History     Tobacco Use   . Smoking status: Never Smoker   . Smokeless tobacco: Never Used   Substance Use Topics   . Alcohol use: No   . Drug use: No       .     Allergies   Allergen Reactions   . Adhesive [Wound Dressing Adhesive]    . Codeine Hives     unknown     . Fentanyl      Other reaction(s): psychological reaction  Hallucinations,    . Morphine    . Morphine And Related Hives     unknown     . Nitroglycerin      Other reaction(s): neurological reaction   . Pain Patch [Menthol]    . Sulfa Antibiotics      unknown       Home Medications     Med List Status: Complete Set By: Brown Human, RN at 04/11/2019 12:03 PM                albuterol (PROVENTIL) (2.5 MG/3ML) 0.083% nebulizer solution     Take 2.5 mg by nebulization every 6 (six) hours as needed  for Wheezing.     allopurinol (ZYLOPRIM) 100 MG tablet     Take 1 tablet (100 mg total) by mouth daily For gout prevention     atenolol (TENORMIN) 50 MG tablet     TAKE 1 TABLET BY MOUTH EVERY DAY     carBAMazepine (TEGretol  XR) 100 MG 12 hr tablet     Take 1 tablet (100 mg total) by mouth daily as needed ("nerves")     diltiazem (TIAZAC) 240 MG 24 hr capsule     Take 240 mg by mouth 2 (two) times daily.        fluorometholone (EFLONE) 0.1 % ophthalmic suspension     Place 1 drop into the left eye 4 (four) times daily        fluticasone furoate-vilanterol (BREO ELLIPTA) 100-25  MCG/INH Aerosol Pwdr, Breath Activated     Inhale 1 puff into the lungs daily     furosemide (LASIX) 40 MG tablet     Take 1 tablet (40 mg total) by mouth daily     HYDROcodone-acetaminophen (NORCO) 5-325 MG per tablet     Take 1 tablet by mouth every 6 (six) hours as needed for Pain     ketoconazole (NIZORAL) 2 % cream     Apply topically daily.     latanoprost (XALATAN) 0.005 % ophthalmic solution     Place 1 drop into both eyes nightly        potassium chloride (K-DUR) 10 MEQ tablet     Take 1 tablet (10 mEq total) by mouth daily     predniSONE (DELTASONE) 10 MG tablet     Take 4,3,2, and then stay on 1 tablet per day     rosuvastatin (CRESTOR) 10 MG tablet     Take 1 tablet (10 mg total) by mouth daily     spironolactone (ALDACTONE) 25 MG tablet     Take 1 tablet (25 mg total) by mouth daily     tiotropium (SPIRIVA RESPIMAT) 2.5 MCG/ACT inhalation spray     Inhale 2 puffs into the lungs daily     warfarin (COUMADIN) 3 MG tablet     Take 1 tablet (3 mg total) by mouth daily.     Patient taking differently: Take 2.5 mg by mouth daily 5mg  Wed and Thurs, and 2.5mg  the other days of the week.             Review of Systems   Constitutional: Negative for chills and fever.   HENT: Negative.    Eyes: Negative.    Respiratory: Positive for shortness of breath. Negative for chest tightness.    Cardiovascular: Positive for leg swelling. Negative for  palpitations.   Gastrointestinal: Negative.    Endocrine: Negative.    Genitourinary: Negative.    Musculoskeletal: Negative for myalgias.   Skin: Negative for pallor.   Allergic/Immunologic: Negative.    Neurological: Negative.    Hematological: Negative.    Psychiatric/Behavioral: Negative.    All other systems reviewed and are negative.      Physical Exam    BP: 156/76, Heart Rate: (!) 116, Temp: 98.2 F (36.8 C), Resp Rate: 22, SpO2: 98 %, Weight: 76.7 kg    Physical Exam  Vitals signs and nursing note reviewed.   Constitutional:       Appearance: She is not diaphoretic.      Comments: In respiratory distress   HENT:      Head: Normocephalic and atraumatic.      Right Ear: External ear normal.      Left Ear: External ear normal.      Nose: Nose normal.      Mouth/Throat:      Mouth: Mucous membranes are moist.      Pharynx: Oropharynx is clear.   Eyes:      Extraocular Movements: Extraocular movements intact.      Conjunctiva/sclera: Conjunctivae normal.      Pupils: Pupils are equal, round, and reactive to light.   Neck:      Musculoskeletal: Normal range of motion and neck supple. No muscular tenderness.      Comments: ++JVD  Cardiovascular:      Rate and Rhythm: Normal rate. Rhythm irregular.      Pulses: Normal pulses.   Pulmonary:      Effort: Respiratory  distress present.      Comments: Diminished breath sounds throughout, crackles at bases  Abdominal:      General: Abdomen is flat. Bowel sounds are normal.      Palpations: Abdomen is soft.   Musculoskeletal: Normal range of motion.         General: Swelling present. No tenderness or deformity.      Right lower leg: Edema present.      Left lower leg: Edema present.   Skin:     General: Skin is warm and dry.      Capillary Refill: Capillary refill takes less than 2 seconds.   Neurological:      General: No focal deficit present.      Mental Status: She is alert and oriented to person, place, and time. Mental status is at baseline.      Cranial Nerves: No  cranial nerve deficit.      Sensory: No sensory deficit.      Motor: No weakness.      Coordination: Coordination normal.      Gait: Gait normal.      Deep Tendon Reflexes: Reflexes normal.   Psychiatric:         Mood and Affect: Mood normal.         Behavior: Behavior normal.         Thought Content: Thought content normal.         Judgment: Judgment normal.           MDM and ED Course     ED Medication Orders (From admission, onward)    Start Ordered     Status Ordering Provider    04/11/19 1242 04/11/19 1241  dilTIAZem (CARDIZEM) injection 10 mg  Once in ED     Route: Intravenous  Ordered Dose: 10 mg     Last MAR action: Given Obie Dredge S    04/11/19 1141 04/11/19 1140  furosemide (LASIX) injection 80 mg  Once in ED     Route: Intravenous  Ordered Dose: 80 mg     Last MAR action: Given Obie Dredge S    04/11/19 1141 04/11/19 1140  metOLazone (ZAROXOLYN) tablet 10 mg  Once in ED     Route: Oral  Ordered Dose: 10 mg     Last MAR action: Given Stefan Karen, Murlean Iba S             MDM  Number of Diagnoses or Management Options  Acute on chronic diastolic (congestive) heart failure:   Anticoagulated on warfarin:   Chronic respiratory failure with hypoxia:   DNR (do not resuscitate):   Permanent atrial fibrillation:   Type 2 myocardial infarction:   Diagnosis management comments:   EKG Interpretation  The ECG interpretation was done contemporaneously by me at the same time the EKG was performed at 1239 PM on 04/11/2019. Rate is 85 BPM. This is an adequate tracing. There is NSST abnormality; the rhythm is rate controlled afib; QRS does not demonstrate a bundle branch block; ST and T waves do not show any ST elevation to suggest acute MI; see Muse for details and measurements; agree with computer interpretation.     HEART Score for Major Cardiac Events:  History: slightly suspicious (0)  EKG: non-specific repolarization disturbance (+1)  Age: > or = 65 (+2)  Risk factors: 1-2 risk factors (+1)  Initial troponin: 1-3x normal  limit (+1)  Score = 5  Scores 4-6 = moderate risk of MACE    Pt  is floridly fluid overloaded on exam.  She has been admitted to our hospital before for this same exact problem.  I will admit to hospital.     1241 PM: I asked unit secretary to page hospitalist on call for admission.     129 PM: Pt has been graciously accepted for admission by hospitalist.                    Procedures    Clinical Impression & Disposition     Clinical Impression  Final diagnoses:   Acute on chronic diastolic (congestive) heart failure   Chronic respiratory failure with hypoxia   Anticoagulated on warfarin   Permanent atrial fibrillation   DNR (do not resuscitate)   Type 2 myocardial infarction        ED Disposition     ED Disposition Condition Date/Time Comment    Admit  Fri Apr 11, 2019 11:49 AM [name] Hellinger to be admitted.    Condition at disposition: Fair             New Prescriptions    No medications on file         CRITICAL CARE:  I have personally performed a history, physical exam, and my own medical decision making. Upon my evaluation, this patient had a high probability of imminent or life-threatening deterioration due to acute on chronic diastolic CHF exacerbation and respiratory failure, which required my direct attention, intervention, and personal management. Pt placed on BiPAP soon after my exam.  I have personally provided 65 minutes of critical care time exclusive of time spent on separately billable procedures. Time includes review of laboratory data, radiology results, discussion with consultants, and monitoring for potential decompensation. Interventions were performed as documented above.  Total critical care time [65] minutes. Total critical care time documented does not include time spent on separately billed procedures or the services of residents, students, nurses or physician assistants. I personally saw and examined the patient. I have reviewed all diagnostic interpretations and treatment plans as  written. I was present for the key portions of any procedures performed and the inclusive time noted in any critical care statement. Critical care time includes patient management by me, time spent at the patient's bedside, time to review lab and imaging results, discussing patient care, documentation in the medical record, and time spent with the family or caregiver.                 Osa Craver, MD  04/11/19 1329

## 2019-04-11 NOTE — Respiratory Progress Note (Addendum)
BIPAP initiated explaining to the patient about BIPAP. This RT held the mask on patient's face to get acclimated to the device. Patient after about 5 minutes refused to have BIPAP on. Dr Iva Lento notified.

## 2019-04-12 LAB — ECG 12-LEAD
Patient Age: 78 years
Q-T Interval(Corrected): 472 ms
Q-T Interval: 397 ms
QRS Axis: 4 deg
QRS Duration: 102 ms
T Axis: 1 years
Ventricular Rate: 85 //min

## 2019-04-12 LAB — PT/INR
PT INR: 2 — ABNORMAL HIGH (ref 0.9–1.2)
PT: 20.8 s — ABNORMAL HIGH (ref 9.4–11.3)

## 2019-04-12 MED ORDER — FUROSEMIDE 40 MG PO TABS
40.0000 mg | ORAL_TABLET | Freq: Two times a day (BID) | ORAL | Status: DC
Start: 2019-04-13 — End: 2019-04-13
  Administered 2019-04-13 (×2): 40 mg via ORAL
  Filled 2019-04-12 (×2): qty 1

## 2019-04-12 MED ORDER — METOLAZONE 2.5 MG PO TABS
2.5000 mg | ORAL_TABLET | Freq: Every day | ORAL | Status: DC
Start: 2019-04-12 — End: 2019-04-13
  Administered 2019-04-13: 10:00:00 2.5 mg via ORAL
  Filled 2019-04-12: qty 1

## 2019-04-12 MED ORDER — FLUOROMETHOLONE 0.1 % OP SUSP
1.00 [drp] | Freq: Every evening | OPHTHALMIC | Status: DC
Start: 2019-04-13 — End: 2019-04-13

## 2019-04-12 NOTE — Progress Notes (Signed)
Patient is sitting up in bed with legs elevated. Bed in locked position. The foley catheter appears to be leaking, I have deflated and reinflated the balloon but her bed is wet again. Will try a new catheter. Call bell in reach.

## 2019-04-12 NOTE — Progress Notes (Signed)
Medicine Progress Note   [facility]  [employer]   Patient Name: [name],[name]: 1 days   Attending Physician: [name], MD PCP: Graciela Husbands, DO      Hospital Course:                                                            Insiyah Hanover [name] is a 78 y.o. female patient   very fragile, with a past medical history significant for A. fib on anticoagulation, coronary artery disease, diastolic heart failure grade 2, COPD with chronic hypoxic respiratory failure, hypertension, sick sinus syndrome status post pacemaker presented to the hospital with a 1 week history of worsening shortness of breath and heaviness of both lower extremity.  She stated she has lower extremity edema since her last discharge from hospital and has been getting worse recently .     Moreover she complains of chest pain most likely musculoskeletal keeping it is reproducible with chest palpation.  Her EKG did not show any evidence of acute ischemic event.  Troponin is within normal limit I would use lidocaine patch  She complains of right hip pain with ambulation most likely secondary to osteoarthritis I would initiate her on Motrin as needed       Assessment and Plan:      #Shortness of breath lower extremity edema most likely secondary to CHF exacerbation acute on chronic diastolic type  --Stated that her primary care doctor Dr. Amada Jupiter recently decrease the dose of Lasix from 80 mg twice daily to 40 mg twice daily  -We will switch to Lasix 40 mg twice daily, add low-dose of metolazone  -BMP tomorrow  -Strict INO's  -Continue metaxalone  -Continue spironolactone      #History of COPD with chronic hypoxic respiratory failure on home oxygen 24/7  -Continue Spiriva  -Prednisone 10 mg daily  -Breo      #Chest pain most likely chest wall pain  -The pain is reproducible with chest palpation  -I would proceed with lidocaine patch    #Right hip pain mostly with ambulation  -Possibly secondary to osteoarthritis  given her age    #Atrial fibrillation  -The rate is controlled  -Continue warfarin  -Continue diltiazem    #History of gout disease  -Continue allopurinol    #History of coronary artery disease  -Continue atenolol         DVT PPx: Anticoagulated   Dispo: Inpatient  Healthcare Proxy:   Code: full code    Disposition: home  DVT PPX:  Anticoagulated  Code:  NO CPR  -  ALLOW NATURAL DEATH     Subjective   Dysplasia        Objective   Physical Exam:       Vitals: T:97.5 F (36.4 C) (Oral), BP:124/70, HR:65, RR:20, SaO2:98%    General: Patient is awake. In no acute distress.  HEENT: No conjunctival drainage, vision is intact, anicteric sclera.  Neck: Supple, no thyromegaly.  Chest: CTA bilaterally. No rhonchi, no wheezing. No use of accessory muscles.  CVS: Normal rate and regular rhythm no murmurs, without JVD, no pitting edema, pulses palpable.  Abdomen: Soft, non-tender, no guarding or rigidity, with normal bowel sounds.  Extremities: No calf swelling and no gross deformity.  Skin: Warm, dry,  no rash and no worrisome lesions.  NEURO: No motor or sensory deficits.  Psychiatric: Alert, interactive, appropriate, normal affect.  Weight Monitoring 02/27/2019 02/28/2019 03/01/2019 03/06/2019 04/11/2019 04/11/2019 04/12/2019   Height 165.1 cm - - 165.1 cm 165.1 cm 160 cm -   Height Method - - - - Stated - -   Weight 79.2 kg 74.7 kg 73.9 kg 75.297 kg 76.658 kg 79.5 kg 77.4 kg   Weight Method - Standing Scale Standing Scale - Stated - Standing Scale   BMI (calculated) 29.1 kg/m2 - - 27.7 kg/m2 28.2 kg/m2 31.1 kg/m2 -         Intake/Output Summary (Last 24 hours) at 04/12/2019 1745  Last data filed at 04/12/2019 1454  Gross per 24 hour   Intake --   Output 2025 ml   Net -2025 ml     Body mass index is 30.23 kg/m.     Meds:     Current Facility-Administered Medications   Medication Dose Route Frequency   . allopurinol  100 mg Oral Daily   . atenolol  50 mg Oral Daily   . dilTIAZem  240 mg Oral BID   . [START ON 04/13/2019]  fluorometholone  1 drop Left Eye QHS   . fluticasone furoate-vilanterol  1 puff Inhalation QAM   . furosemide  40 mg Intravenous BID   . latanoprost  1 drop Both Eyes QHS   . lidocaine  1 patch Transdermal Q24H   . potassium chloride  10 mEq Oral Daily   . predniSONE  10 mg Oral Daily   . rosuvastatin  10 mg Oral Daily   . sodium chloride (PF)  3 mL Intravenous Q8H   . spironolactone  25 mg Oral Daily   . tiotropium  2 puff Inhalation QAM   . warfarin  3 mg Oral Daily   . warfarin therapy placeholder  1 each Does not apply See Admin Instructions       PRN Meds: acetaminophen **OR** acetaminophen **OR** acetaminophen, albuterol, HYDROcodone-acetaminophen, ibuprofen.     LABS:     Estimated Creatinine Clearance: 42.9 mL/min (based on SCr of 1.03 mg/dL).  Recent Labs   Lab 04/11/19  1145   WBC 10.6   RBC 3.86   Hemoglobin 11.6*   Hematocrit 36.3   MCV 94   PLT CT 160     Recent Labs   Lab 04/12/19  0525 04/11/19  1145   PT 20.8* 19.4*   PT INR 2.0* 1.9*     Recent Labs   Lab 04/11/19  1145   Troponin I 0.07*     No results found for: [id number]  Recent Labs   Lab 04/11/19  1145   Glucose 120*   Sodium 139   Potassium 4.6   Chloride 99   CO2 29.0   BUN 34*   Creatinine 1.03   EGFR 51*   Calcium 10.8*     Recent Labs   Lab 04/11/19  1145   Albumin 4.4   Protein, Total 6.7   Bilirubin, Total 0.8   Alkaline Phosphatase 133   ALT 27   AST (SGOT) 30           Invalid input(s):  AMORPHOUSUA   Patient Lines/Drains/Airways Status    Active PICC Line / CVC Line / PIV Line / Drain / Airway / Intraosseous Line / Epidural Line / ART Line / Line / Wound / Pressure Ulcer / NG/OG Tube     Name:   Placement  date:   Placement time:   Site:   Days:    Peripheral IV 04/11/19 20 G Right Antecubital   04/11/19    1145    Antecubital   1               Xr Chest Ap Portable    Result Date: 04/11/2019  Stable left cardiac pacer. No pneumothorax. Stable cardiomegaly and aneurysmal thoracic aortic appearance. Elevated left hemidiaphragm. Small  left effusion. Mild left basilar atelectasis. Stable pulmonary vascularity. ReadingStation:[id number]     Home Health Needs:  There are no questions and answers to display.       Nutrition assessment done in collaboration with Registered Dietitians:     Time spent:      [name], MD     04/12/19,5:45 PM   MRN: [id number]                                      CSN: [id number] DOB: 30-Dec-1940

## 2019-04-13 LAB — BASIC METABOLIC PANEL
Anion Gap: 13.5 mMol/L (ref 7.0–18.0)
BUN / Creatinine Ratio: 37.1 Ratio — ABNORMAL HIGH (ref 10.0–30.0)
BUN: 39 mg/dL — ABNORMAL HIGH (ref 7–22)
CO2: 34 mMol/L — ABNORMAL HIGH (ref 20.0–30.0)
Calcium: 9.9 mg/dL (ref 8.5–10.5)
Chloride: 89 mMol/L — ABNORMAL LOW (ref 98–110)
Creatinine: 1.05 mg/dL (ref 0.60–1.20)
EGFR: 50 mL/min/{1.73_m2} — ABNORMAL LOW (ref 60–150)
Glucose: 95 mg/dL (ref 71–99)
Osmolality Calculated: 276 mOsm/kg (ref 275–300)
Potassium: 3.5 mMol/L (ref 3.5–5.3)
Sodium: 133 mMol/L — ABNORMAL LOW (ref 136–147)

## 2019-04-13 LAB — PT/INR
PT INR: 2.3 — ABNORMAL HIGH (ref 0.9–1.2)
PT: 24.2 s — ABNORMAL HIGH (ref 9.4–11.3)

## 2019-04-13 MED ORDER — METOLAZONE 2.5 MG PO TABS
2.5000 mg | ORAL_TABLET | Freq: Every day | ORAL | 0 refills | Status: DC
Start: 2019-04-14 — End: 2019-05-20

## 2019-04-13 MED ORDER — IBUPROFEN 400 MG PO TABS
400.0000 mg | ORAL_TABLET | Freq: Four times a day (QID) | ORAL | 0 refills | Status: DC | PRN
Start: 2019-04-13 — End: 2019-05-21

## 2019-04-13 NOTE — Discharge Summary (Signed)
Medicine Discharge Summary   [facility]  [employer]   Patient Name: [name]   Attending Physician: [name], MD PCP: Graciela Husbands, DO   Date of Admission: 04/11/2019 D/C Date: 04/13/2019   Discharge Diagnoses:     #Acute on chronic diastolic heart failure     Hospital Course       [name] is a 78 y.o. female patient that was admitted on 04/11/2019  fragile, with a past medical history significant for A. fib on anticoagulation, coronary artery disease, diastolic heart failure grade 2, COPD with chronic hypoxic respiratory failure, hypertension, sick sinus syndrome status post pacemaker presented to the hospital with a 1 week history of worsening shortness of breath and heaviness of both lower extremity. She stated she has lower extremity edema since her last discharge from hospital and has been getting worse recently .    Moreover she complains of chest pain most likely musculoskeletal keeping it is reproducible with chest palpation. Her EKG did not show any evidence of acute ischemic event. Troponin is within normal limit I would use lidocaine patch    Shortness of breath lower extremity edema most likely secondary to CHF exacerbation acute on chronic diastolic type  --Stated that her primary care doctor Dr. Amada Jupiter recently decrease the dose of Lasix from 80 mg twice daily to 40 mg twice daily  -Initially she was started on Lasix 40 mg twice daily.  She responded very well.  To switch back to oral Lasix 40 mg twice daily.  Low-dose of added metaxalone was added to her regimen (2.5 mg before on Lasix in the morning)  -She was advised to have a close follow-up with her primary care physician Dr. Amada Jupiter to repeat her BMP      #History of COPD with chronic hypoxic respiratory failure on home oxygen 24/7  -Continue Spiriva  -Prednisone 10 mg daily  -Breo      #Chest pain most likely chest wall pain  -The pain is reproducible with chest palpation  -Subsided with  lidocaine patch    #Right hip pain mostly with ambulation  -Possibly secondary to osteoarthritis given her age  -Motrin was added to her regimen    #Atrial fibrillation  -The rate is controlled  -Continue warfarin  -Continue diltiazem    #History of gout disease  -Continue allopurinol    #History of coronary artery disease  -Continue atenolol        DVT UEA:[id number]  Dispo:Inpatient  Healthcare Proxy:   Code:full code    Active Problems:    CHF (congestive heart failure)  Resolved Problems:    * No resolved hospital problems. *     Pending Results and other significant studies:  None     Discharge Instructions:          Disposition: Home  Diet: Cardiac Diet  Activity: As tolerated  Discharge Code Status: NO CPR  -  ALLOW NATURAL DEATH    No follow-up provider specified.     Discharge Medications:                                                                        Discharge Medication List      Taking  albuterol (2.5 MG/3ML) 0.083% nebulizer solution  Dose: 2.5 mg  Commonly known as: PROVENTIL  Take 2.5 mg by nebulization every 6 (six) hours as needed for Wheezing.     allopurinol 100 MG tablet  Dose: 100 mg  Commonly known as: ZYLOPRIM  Take 1 tablet (100 mg total) by mouth daily For gout prevention     atenolol 50 MG tablet  Commonly known as: TENORMIN  TAKE 1 TABLET BY MOUTH EVERY DAY     dilTIAZem 240 MG 24 hr capsule  Dose: 240 mg  Commonly known as: TIAZAC  Take 240 mg by mouth 2 (two) times daily.      fluorometholone 0.1 % ophthalmic suspension  Dose: 1 drop  Commonly known as: EFLONE  Place 1 drop into the left eye 4 (four) times daily      fluticasone furoate-vilanterol 100-25 MCG/INH Aepb  Dose: 1 puff  Commonly known as: BREO ELLIPTA  Inhale 1 puff into the lungs daily     furosemide 40 MG tablet  Dose: 40 mg  Commonly known as: LASIX  Take 1 tablet (40 mg total) by mouth daily     HYDROcodone-acetaminophen 5-325 MG per tablet  Dose: 1 tablet  Commonly known as: NORCO  Take 1 tablet  by mouth every 6 (six) hours as needed for Pain     ibuprofen 400 MG tablet  Dose: 400 mg  Commonly known as: ADVIL  Take 1 tablet (400 mg total) by mouth every 6 (six) hours as needed for Pain or Fever     latanoprost 0.005 % ophthalmic solution  Dose: 1 drop  Commonly known as: XALATAN  Place 1 drop into both eyes nightly      metOLazone 2.5 MG tablet  Dose: 2.5 mg  Commonly known as: ZAROXOLYN  Start taking on: April 14, 2019  Take 1 tablet (2.5 mg total) by mouth daily     potassium chloride 10 MEQ tablet  Dose: 10 mEq  Commonly known as: K-DUR  Take 1 tablet (10 mEq total) by mouth daily     predniSONE 10 MG tablet  Commonly known as: DELTASONE  For: Acute Joint Inflammation in Gout  Take 4,3,2, and then stay on 1 tablet per day     rosuvastatin 10 MG tablet  Dose: 10 mg  Commonly known as: CRESTOR  Take 1 tablet (10 mg total) by mouth daily     spironolactone 25 MG tablet  Dose: 25 mg  Commonly known as: ALDACTONE  Take 1 tablet (25 mg total) by mouth daily     tiotropium 2.5 MCG/ACT inhalation spray  Dose: 2 puff  Commonly known as: SPIRIVA RESPIMAT  Inhale 2 puffs into the lungs daily     warfarin 3 MG tablet  Dose: 3 mg  What changed:    how much to take   additional instructions  Commonly known as: COUMADIN  Take 1 tablet (3 mg total) by mouth daily.        STOP taking these medications    carBAMazepine 100 MG 12 hr tablet  Commonly known as: TEGretol  XR     ketoconazole 2 % cream  Commonly known as: NIZORAL             Discharge Day Exam (04/13/2019):     Blood pressure 127/60, pulse 70, temperature 98.6 F (37 C), temperature source Tympanic, resp. rate (!) 24, height 1.6 m (5\' 3" ), weight 76.3 kg (168 lb 3.4 oz), SpO2 96 %.  General: Patient is awake. In no acute distress.  Chest: CTA bilaterally. No rhonchi, no wheezing. No use of accessory muscles.  CVS: Normal rate and regular rhythm no murmurs, without JVD.  Abdomen: Soft, non-tender, no guarding or rigidity, with normal bowel  sounds.  Extremities: No pitting edema, pulses palpable, no calf swelling and no gross deformity.  Skin: Warm, dry  NEURO: No motor or sensory deficits.     Recent Labs      Recent Labs   Lab 04/11/19  1145   WBC 10.6   RBC 3.86   Hemoglobin 11.6*   Hematocrit 36.3   MCV 94   PLT CT 160     Recent Labs   Lab 04/13/19  0548 04/12/19  0525 04/11/19  1145   PT 24.2* 20.8* 19.4*   PT INR 2.3* 2.0* 1.9*     Recent Labs   Lab 04/11/19  1145   Troponin I 0.07*     No results found for: [id number]  Recent Labs   Lab 04/13/19  0548 04/11/19  1145   Glucose 95 120*   Sodium 133* 139   Potassium 3.5 4.6   Chloride 89* 99   CO2 34.0* 29.0   BUN 39* 34*   Creatinine 1.05 1.03   EGFR 50* 51*   Calcium 9.9 10.8*     Recent Labs   Lab 04/11/19  1145   Albumin 4.4   Protein, Total 6.7   Bilirubin, Total 0.8   Alkaline Phosphatase 133   ALT 27   AST (SGOT) 30        Allergies:      Adhesive [wound dressing adhesive], Codeine, Fentanyl, Morphine, Morphine and related, Nitroglycerin, Pain patch [menthol], and Sulfa antibiotics   Time spent on discharging the patient:  45 minutes   Xr Chest Ap Portable    Result Date: 04/11/2019  Stable left cardiac pacer. No pneumothorax. Stable cardiomegaly and aneurysmal thoracic aortic appearance. Elevated left hemidiaphragm. Small left effusion. Mild left basilar atelectasis. Stable pulmonary vascularity. ReadingStation:[id number]     Home Health Needs:  There are no questions and answers to display.      [name], MD         04/13/19 1:41 PM   MRN: [id number]                                      CSN: [id number] DOB: [birth year]

## 2019-04-13 NOTE — Progress Notes (Signed)
Discharge instructions reviewed with [name] and she verbalized understanding. Pt is dressed, has her personal belongings and has copy of discharge instructions. Pt discharged to [facility].

## 2019-04-14 ENCOUNTER — Telehealth (RURAL_HEALTH_CENTER): Payer: Self-pay | Admitting: Internal Medicine

## 2019-04-14 NOTE — Telephone Encounter (Signed)
Patient called and needs a 4-5 day follow up from the hospital with Dr. Amada Jupiter.  She was very SOB and unable to complete a sentence without stopping to catch her  Breath.

## 2019-04-14 NOTE — Telephone Encounter (Signed)
See [place] on Thursday at 230 PM. Take 5mg  of metolazone in the mornig before lasix. Take the lasix on an empty stomach for best results.

## 2019-04-14 NOTE — Telephone Encounter (Signed)
Dr. Dale........; Please see message below and advise....pkt

## 2019-04-14 NOTE — Telephone Encounter (Signed)
The patient has been scheduled for Thurs and informed....pkt

## 2019-04-15 LAB — ECG 12-LEAD
Patient Age: 78 years
Q-T Interval(Corrected): 467 ms
Q-T Interval: 436 ms
QRS Axis: -1 deg
QRS Duration: 103 ms
T Axis: -7 years
Ventricular Rate: 69 //min

## 2019-04-17 ENCOUNTER — Telehealth (RURAL_HEALTH_CENTER): Payer: Self-pay | Admitting: Internal Medicine

## 2019-04-17 ENCOUNTER — Ambulatory Visit (RURAL_HEALTH_CENTER): Payer: Self-pay | Admitting: Internal Medicine

## 2019-04-17 ENCOUNTER — Ambulatory Visit: Payer: Medicare Other

## 2019-04-17 MED ORDER — HYDROCODONE-ACETAMINOPHEN 5-325 MG PO TABS
1.0000 | ORAL_TABLET | Freq: Four times a day (QID) | ORAL | 0 refills | Status: DC | PRN
Start: 2019-04-17 — End: 2019-05-23

## 2019-04-17 NOTE — Progress Notes (Signed)
Anticoagulation Progress Report   April 17, 2019  Name: ROCKELLE, KUPERUS [facility]    Medical Record Number: [id number]  Reason for Treatment: Atrial fibrillation unspecified     INR Range: 2.0-3.0    Date: 2019-04-17 11:13  Location: [facility]    Update Note: I called and spoke with the patient and rescheduled for 12/21   due to weather.     Submitted by Kennyth Arnold RN

## 2019-04-17 NOTE — Telephone Encounter (Signed)
Sp w/patient to let her know we would be calling tomorrow to reschedule her appointment.  She states that she needs a refill on her pain medication, hydrocodone, sent to [facility].      She also asked if she would be a candidate for a hospital bed.  She is now sleeping in her recliner so that she can have her feet propped up.

## 2019-04-21 ENCOUNTER — Ambulatory Visit: Payer: Medicare Other | Attending: Internal Medicine

## 2019-04-21 DIAGNOSIS — Z5181 Encounter for therapeutic drug level monitoring: Secondary | ICD-10-CM | POA: Insufficient documentation

## 2019-04-21 DIAGNOSIS — I4891 Unspecified atrial fibrillation: Secondary | ICD-10-CM | POA: Insufficient documentation

## 2019-04-21 DIAGNOSIS — Z7901 Long term (current) use of anticoagulants: Secondary | ICD-10-CM | POA: Insufficient documentation

## 2019-04-21 DIAGNOSIS — I4821 Permanent atrial fibrillation: Secondary | ICD-10-CM

## 2019-04-21 LAB — POCT PROTHROMBIN TIME (PT): INR POCT: 2.4 (ref 2–3)

## 2019-04-21 NOTE — Progress Notes (Signed)
Anticoagulation Progress Report   April 21, 2019  Name: [name], [name] [facility]    Medical Record Number: [id number]  Reason for Treatment: Atrial fibrillation unspecified     INR Range: 2.0-3.0    Date: 2019-04-21 11:15  Location: [facility]    INR: 2.40    Tablet Strength: 5 mg    Next INR: 29 days 2019-05-20    Update Note:     Dosage Out: :Su-2.5mg :M-2.5mg :T-5mg :W-2.5mg :Th-2.5mg :F-2.5mg :Sa-2.5mg :

## 2019-04-23 ENCOUNTER — Ambulatory Visit (RURAL_HEALTH_CENTER): Payer: Self-pay | Admitting: Internal Medicine

## 2019-04-29 ENCOUNTER — Encounter (RURAL_HEALTH_CENTER): Payer: Self-pay | Admitting: Internal Medicine

## 2019-04-29 ENCOUNTER — Other Ambulatory Visit
Admission: RE | Admit: 2019-04-29 | Discharge: 2019-04-29 | Disposition: A | Discharge status: Home / Self Care | Payer: Medicare Other | Source: Ambulatory Visit | Attending: Internal Medicine | Admitting: Internal Medicine

## 2019-04-29 ENCOUNTER — Ambulatory Visit: Payer: Medicare Other | Attending: Internal Medicine | Admitting: Internal Medicine

## 2019-04-29 VITALS — BP 137/59 | HR 71 | Temp 98.3°F | Ht 65.0 in | Wt 174.2 lb

## 2019-04-29 DIAGNOSIS — I4821 Permanent atrial fibrillation: Secondary | ICD-10-CM

## 2019-04-29 DIAGNOSIS — R600 Localized edema: Secondary | ICD-10-CM

## 2019-04-29 DIAGNOSIS — I5032 Chronic diastolic (congestive) heart failure: Secondary | ICD-10-CM

## 2019-04-29 DIAGNOSIS — Z09 Encounter for follow-up examination after completed treatment for conditions other than malignant neoplasm: Secondary | ICD-10-CM

## 2019-04-29 MED ORDER — FUROSEMIDE 40 MG PO TABS
40.0000 mg | ORAL_TABLET | Freq: Two times a day (BID) | ORAL | 3 refills | Status: DC
Start: 2019-04-29 — End: 2019-07-10

## 2019-04-29 NOTE — Progress Notes (Signed)
PROGRESS NOTE    Date Time: 04/29/2019 4:49 PM  Patient Name: [facility] [name]  Primary Care Physician: Graciela Husbands, DO      History of Presenting Illness:   [name] is a 78 y.o. female who presents to the office with Follow-up (PMH F/U) and Sore lower right leg  [name] is back today for recheck.  She is accompanied by her daughter [name].  [name] lives in [place] and is here for a family funeral.  [name]' younger brother passed away recently from complications of heart failure and pneumonia.  She continues to struggle with her breathing.  Her daughter tells me that when she talks to her on the phone she can tell she is short of breath.  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  She also has pulmonary hypertension.  She is on chronic oxygen management.  She has chronic right heart failure with venous congestion that is quite bad in her legs.  She probably has chronic venous insufficiency as well.  Her legs are swollen, darkly pigmented with hemosiderin, and there are ulcerations now seen in the medial aspect of the right leg.  She is currently sleeping in her reclining chair.  I do think that a hospital bed would be perfect for her as I think she would be able to breathe better and it would probably help with her lower extremity edema as well.  Heart failure is a common reason for a hospital bed and she certainly qualifies.    Past Medical History:     Past Medical History:   Diagnosis Date   . Aortic aneurysm    . Atrial fibrillation    . Chronic obstructive pulmonary disease    . Coronary artery disease    . Gastroesophageal reflux disease    . Hypertension    . Malignant neoplasm    . Pacemaker    . SSS (sick sinus syndrome)        Past Surgical History:     Past Surgical History:   Procedure Laterality Date   . CARDIAC PACEMAKER PLACEMENT      2002   . HYSTERECTOMY         Family History:     Family History   Problem  Relation Age of Onset   . Breast cancer Sister        Social History:     Social History     Tobacco Use   Smoking Status Never Smoker   Smokeless Tobacco Never Used     Social History     Substance and Sexual Activity   Alcohol Use No     Social History     Substance and Sexual Activity   Drug Use No       Allergies:     Allergies   Allergen Reactions   . Adhesive [Wound Dressing Adhesive]    . Codeine Hives     unknown     . Fentanyl      Other reaction(s): psychological reaction  Hallucinations,    . Morphine    . Morphine And Related Hives     unknown     . Nitroglycerin      Other reaction(s): neurological reaction   . Pain Patch [Menthol]    . Sulfa Antibiotics      unknown       Medications:     Prior to Admission medications    Medication Sig Start Date End  Date Taking? Authorizing Provider   albuterol (PROVENTIL) (2.5 MG/3ML) 0.083% nebulizer solution Take 2.5 mg by nebulization every 6 (six) hours as needed for Wheezing.   Yes [provider]   allopurinol (ZYLOPRIM) 100 MG tablet Take 1 tablet (100 mg total) by mouth daily For gout prevention 03/01/19  Yes Graciela Husbands, DO   atenolol (TENORMIN) 50 MG tablet TAKE 1 TABLET BY MOUTH EVERY DAY 03/31/19  Yes Graciela Husbands, DO   diltiazem [facility]) 240 MG 24 hr capsule Take 240 mg by mouth 2 (two) times daily.      Yes [provider]   fluorometholone (EFLONE) 0.1 % ophthalmic suspension Place 1 drop into the left eye 4 (four) times daily      Yes [provider]   fluticasone furoate-vilanterol (BREO ELLIPTA) 100-25 MCG/INH Aerosol Pwdr, Breath Activated Inhale 1 puff into the lungs daily 01/30/19  Yes Graciela Husbands, DO   furosemide (LASIX) 40 MG tablet Take 1 tablet (40 mg total) by mouth 2 (two) times daily 04/29/19  Yes Graciela Husbands, DO   HYDROcodone-acetaminophen [facility]) 5-325 MG per tablet Take 1 tablet by mouth every 6 (six) hours as needed for Pain 04/17/19  Yes Graciela Husbands, DO   ibuprofen (ADVIL) 400 MG tablet Take 1 tablet (400 mg  total) by mouth every 6 (six) hours as needed for Pain or Fever 04/13/19  Yes [name], MD   latanoprost (XALATAN) 0.005 % ophthalmic solution Place 1 drop into both eyes nightly      Yes [provider]   metOLazone (ZAROXOLYN) 2.5 MG tablet Take 1 tablet (2.5 mg total) by mouth daily 04/14/19  Yes [name], MD   potassium chloride (K-DUR) 10 MEQ tablet Take 1 tablet (10 mEq total) by mouth daily 03/01/19  Yes Graciela Husbands, DO   predniSONE (DELTASONE) 10 MG tablet Take 4,3,2, and then stay on 1 tablet per day 03/01/19  Yes Graciela Husbands, DO   rosuvastatin (CRESTOR) 10 MG tablet Take 1 tablet (10 mg total) by mouth daily 03/06/19  Yes Graciela Husbands, DO   spironolactone (ALDACTONE) 25 MG tablet Take 1 tablet (25 mg total) by mouth daily 03/02/19  Yes Graciela Husbands, DO   tiotropium (SPIRIVA RESPIMAT) 2.5 MCG/ACT inhalation spray Inhale 2 puffs into the lungs daily 01/30/19  Yes Graciela Husbands, DO   warfarin (COUMADIN) 3 MG tablet Take 1 tablet (3 mg total) by mouth daily.  Patient taking differently: Take 2.5 mg by mouth daily 5mg  Wed and Thurs, and 2.5mg  the other days of the week.   11/07/13  Yes Grams, Sasha L, DO   furosemide (LASIX) 40 MG tablet Take 1 tablet (40 mg total) by mouth daily 03/01/19 04/29/19 Yes Graciela Husbands, DO   fluorometholone (FML) 0.1 % ophthalmic suspension  04/07/19   [provider]       Review of Systems:    Review of Systems   Constitutional: Positive for malaise/fatigue. Negative for chills, diaphoresis, fever and weight loss.   HENT: Positive for hearing loss. Negative for congestion.    Eyes:        She has glaucoma and is on drops   Respiratory: Positive for shortness of breath and wheezing. Negative for cough.         She also has asthma and uses albuterol via nebulizer.  She also uses a LABA/ICS inhaler once a day   Cardiovascular: Positive for orthopnea, leg swelling and PND. Negative for chest pain and palpitations.  Gastrointestinal: Negative for abdominal pain,  heartburn, nausea and vomiting.   Genitourinary: Negative for dysuria and urgency.   Musculoskeletal: Positive for back pain, joint pain, myalgias and neck pain. Negative for falls.   Skin:        He has venous stasis ulcerations in the right leg.  She has chronic venous insufficiency pigmentation and thin skin in both legs.   Neurological: Positive for weakness. Negative for dizziness and headaches.   Psychiatric/Behavioral: Positive for depression. Negative for hallucinations and memory loss. The patient has insomnia. The patient is not nervous/anxious.              Physical Exam:     Vitals:    04/29/19 1540   BP: 137/59   Pulse: 71   Temp: 98.3 F (36.8 C)   SpO2: 93%     Body mass index is 28.99 kg/m.  On exam she is a chronically ill plethoric woman in no serious distress.  She has facial telangiectasias.  She is on supplemental oxygen at 2 L/min.  Neck veins are distended  There is an LV heave noted  Cardiac rhythm is irregular S1 grade 2 systolic murmur S2  Lungs with rales bilaterally in the bases  Abdomen is obese soft and nontender  There is chronic lymphedema in the legs with deep dark venous stasis pigmentation.  Pulses are difficult to feel.  There is a 15 mm round ulceration draining some purulence without surrounding erythema on the medial aspect of the right leg.  A smaller ulcer is noted near it.  These are venous stasis ulcers.  They are above the malleolus.  Cognitively she is normal.      Assessment:     1. Permanent atrial fibrillation  CBC without differential    Comprehensive metabolic panel   2. Chronic diastolic CHF (congestive heart failure)  CBC without differential    Comprehensive metabolic panel   3. Bilateral lower extremity edema  CBC without differential    Comprehensive metabolic panel   4. Hospital discharge follow-up  CBC without differential    Comprehensive metabolic panel       Plan:   I am increasing her Lasix to 40 mg twice daily.  She takes her first dose in the morning  with a 2.5 mg dose of metolazone.  She is also on spironolactone.  We will update her lab work today looking specifically at her electrolytes.  I am cutting her prednisone to 5 mg daily or half a tablet per day until it is finished and then she will stop the prednisone.  I had her on this in case she broke through with gout as I was bringing down her uric acid with allopurinol.  Finally we are going to order a hospital bed for comfort and allow her to raise the head of her bed to breathe better.  She will also be able to better control her lower extremity edema.  She will continue wearing supplemental oxygen and I have asked her to increase her flow rate to 4 L/min.    Electronically signed by Graciela Husbands D.O.

## 2019-04-30 ENCOUNTER — Telehealth (RURAL_HEALTH_CENTER): Payer: Self-pay

## 2019-04-30 LAB — CBC
Hematocrit: 35.8 % — ABNORMAL LOW (ref 36.0–48.0)
Hemoglobin: 11.4 gm/dL — ABNORMAL LOW (ref 12.0–16.0)
MCH: 29 pg (ref 28–35)
MCHC: 32 gm/dL (ref 32–36)
MCV: 92 fL (ref 80–100)
MPV: 9.3 fL (ref 6.0–10.0)
PLT CT: 142 10*3/uL (ref 130–440)
RBC: 3.89 10*6/uL (ref 3.80–5.00)
RDW: 14.2 % — ABNORMAL HIGH (ref 11.0–14.0)
WBC: 10.4 10*3/uL (ref 4.0–11.0)

## 2019-04-30 LAB — COMPREHENSIVE METABOLIC PANEL
ALT: 21 U/L (ref 0–55)
AST (SGOT): 27 U/L (ref 10–42)
Albumin/Globulin Ratio: 1.86 Ratio (ref 0.80–2.00)
Albumin: 4.1 gm/dL (ref 3.5–5.0)
Alkaline Phosphatase: 138 U/L (ref 40–145)
Anion Gap: 18.1 mMol/L — ABNORMAL HIGH (ref 7.0–18.0)
BUN / Creatinine Ratio: 56.5 Ratio — ABNORMAL HIGH (ref 10.0–30.0)
BUN: 70 mg/dL — ABNORMAL HIGH (ref 7–22)
Bilirubin, Total: 0.8 mg/dL (ref 0.1–1.2)
CO2: 30 mMol/L (ref 20–30)
Calcium: 10.7 mg/dL — ABNORMAL HIGH (ref 8.5–10.5)
Chloride: 92 mMol/L — ABNORMAL LOW (ref 98–110)
Creatinine: 1.24 mg/dL — ABNORMAL HIGH (ref 0.60–1.20)
EGFR: 41 mL/min/{1.73_m2} — ABNORMAL LOW (ref 60–150)
Globulin: 2.2 gm/dL (ref 2.0–4.0)
Glucose: 88 mg/dL (ref 71–99)
Osmolality Calculated: 292 mOsm/kg (ref 275–300)
Potassium: 4.1 mMol/L (ref 3.5–5.3)
Protein, Total: 6.3 gm/dL (ref 6.0–8.3)
Sodium: 136 mMol/L (ref 136–147)

## 2019-04-30 NOTE — Telephone Encounter (Signed)
-----   Message from Graciela Husbands, DO sent at 04/30/2019  9:03 AM EST -----  Labs = fine

## 2019-04-30 NOTE — Telephone Encounter (Signed)
Results of labs left on pt's voicemail with instructions to call if there were any questions.....pkt

## 2019-05-04 ENCOUNTER — Emergency Department: Payer: Medicare Other

## 2019-05-04 ENCOUNTER — Inpatient Hospital Stay
Admission: EM | Admit: 2019-05-04 | Discharge: 2019-05-11 | DRG: 291 | Disposition: A | Discharge status: Other Acute Inpatient Hospital | Payer: Medicare Other | Attending: Internal Medicine | Admitting: Internal Medicine

## 2019-05-04 DIAGNOSIS — H409 Unspecified glaucoma: Secondary | ICD-10-CM | POA: Diagnosis present

## 2019-05-04 DIAGNOSIS — N1831 Chronic kidney disease, stage 3a: Secondary | ICD-10-CM | POA: Diagnosis present

## 2019-05-04 DIAGNOSIS — Z90710 Acquired absence of both cervix and uterus: Secondary | ICD-10-CM

## 2019-05-04 DIAGNOSIS — K5903 Drug induced constipation: Secondary | ICD-10-CM | POA: Diagnosis not present

## 2019-05-04 DIAGNOSIS — I5033 Acute on chronic diastolic (congestive) heart failure: Secondary | ICD-10-CM | POA: Diagnosis present

## 2019-05-04 DIAGNOSIS — I071 Rheumatic tricuspid insufficiency: Secondary | ICD-10-CM | POA: Diagnosis present

## 2019-05-04 DIAGNOSIS — L97312 Non-pressure chronic ulcer of right ankle with fat layer exposed: Secondary | ICD-10-CM | POA: Diagnosis present

## 2019-05-04 DIAGNOSIS — I2510 Atherosclerotic heart disease of native coronary artery without angina pectoris: Secondary | ICD-10-CM | POA: Diagnosis present

## 2019-05-04 DIAGNOSIS — K219 Gastro-esophageal reflux disease without esophagitis: Secondary | ICD-10-CM | POA: Diagnosis present

## 2019-05-04 DIAGNOSIS — Z79899 Other long term (current) drug therapy: Secondary | ICD-10-CM

## 2019-05-04 DIAGNOSIS — E785 Hyperlipidemia, unspecified: Secondary | ICD-10-CM | POA: Diagnosis present

## 2019-05-04 DIAGNOSIS — N189 Chronic kidney disease, unspecified: Secondary | ICD-10-CM | POA: Diagnosis present

## 2019-05-04 DIAGNOSIS — E871 Hypo-osmolality and hyponatremia: Secondary | ICD-10-CM | POA: Diagnosis present

## 2019-05-04 DIAGNOSIS — Z9981 Dependence on supplemental oxygen: Secondary | ICD-10-CM

## 2019-05-04 DIAGNOSIS — J849 Interstitial pulmonary disease, unspecified: Secondary | ICD-10-CM | POA: Diagnosis present

## 2019-05-04 DIAGNOSIS — I83013 Varicose veins of right lower extremity with ulcer of ankle: Secondary | ICD-10-CM | POA: Diagnosis present

## 2019-05-04 DIAGNOSIS — I4821 Permanent atrial fibrillation: Secondary | ICD-10-CM | POA: Diagnosis present

## 2019-05-04 DIAGNOSIS — M109 Gout, unspecified: Secondary | ICD-10-CM | POA: Diagnosis present

## 2019-05-04 DIAGNOSIS — I495 Sick sinus syndrome: Secondary | ICD-10-CM | POA: Diagnosis present

## 2019-05-04 DIAGNOSIS — Z7951 Long term (current) use of inhaled steroids: Secondary | ICD-10-CM

## 2019-05-04 DIAGNOSIS — Z888 Allergy status to other drugs, medicaments and biological substances status: Secondary | ICD-10-CM

## 2019-05-04 DIAGNOSIS — J9611 Chronic respiratory failure with hypoxia: Secondary | ICD-10-CM | POA: Diagnosis present

## 2019-05-04 DIAGNOSIS — Z950 Presence of cardiac pacemaker: Secondary | ICD-10-CM

## 2019-05-04 DIAGNOSIS — Z803 Family history of malignant neoplasm of breast: Secondary | ICD-10-CM

## 2019-05-04 DIAGNOSIS — Z7901 Long term (current) use of anticoagulants: Secondary | ICD-10-CM

## 2019-05-04 DIAGNOSIS — I130 Hypertensive heart and chronic kidney disease with heart failure and stage 1 through stage 4 chronic kidney disease, or unspecified chronic kidney disease: Principal | ICD-10-CM | POA: Diagnosis present

## 2019-05-04 DIAGNOSIS — Z885 Allergy status to narcotic agent status: Secondary | ICD-10-CM

## 2019-05-04 DIAGNOSIS — Z7952 Long term (current) use of systemic steroids: Secondary | ICD-10-CM

## 2019-05-04 DIAGNOSIS — I272 Pulmonary hypertension, unspecified: Secondary | ICD-10-CM | POA: Diagnosis present

## 2019-05-04 DIAGNOSIS — Z91048 Other nonmedicinal substance allergy status: Secondary | ICD-10-CM

## 2019-05-04 DIAGNOSIS — I5021 Acute systolic (congestive) heart failure: Secondary | ICD-10-CM

## 2019-05-04 DIAGNOSIS — J449 Chronic obstructive pulmonary disease, unspecified: Secondary | ICD-10-CM | POA: Diagnosis present

## 2019-05-04 DIAGNOSIS — I872 Venous insufficiency (chronic) (peripheral): Secondary | ICD-10-CM | POA: Diagnosis present

## 2019-05-04 DIAGNOSIS — Z882 Allergy status to sulfonamides status: Secondary | ICD-10-CM

## 2019-05-04 DIAGNOSIS — L03115 Cellulitis of right lower limb: Secondary | ICD-10-CM | POA: Diagnosis present

## 2019-05-04 DIAGNOSIS — Z66 Do not resuscitate: Secondary | ICD-10-CM | POA: Diagnosis present

## 2019-05-04 DIAGNOSIS — I5043 Acute on chronic combined systolic (congestive) and diastolic (congestive) heart failure: Secondary | ICD-10-CM | POA: Diagnosis present

## 2019-05-04 LAB — COMPREHENSIVE METABOLIC PANEL
ALT: 21 U/L (ref 0–55)
AST (SGOT): 28 U/L (ref 10–42)
Albumin/Globulin Ratio: 1.58 Ratio (ref 0.80–2.00)
Albumin: 4.1 gm/dL (ref 3.5–5.0)
Alkaline Phosphatase: 132 U/L (ref 40–145)
Anion Gap: 17.1 mMol/L (ref 7.0–18.0)
BUN / Creatinine Ratio: 45.2 Ratio — ABNORMAL HIGH (ref 10.0–30.0)
BUN: 80 mg/dL — ABNORMAL HIGH (ref 7–22)
Bilirubin, Total: 0.6 mg/dL (ref 0.1–1.2)
CO2: 31 mMol/L — ABNORMAL HIGH (ref 20.0–30.0)
Calcium: 10.5 mg/dL (ref 8.5–10.5)
Chloride: 90 mMol/L — ABNORMAL LOW (ref 98–110)
Creatinine: 1.77 mg/dL — ABNORMAL HIGH (ref 0.60–1.20)
EGFR: 27 mL/min/{1.73_m2} — ABNORMAL LOW (ref 60–150)
Globulin: 2.6 gm/dL (ref 2.0–4.0)
Glucose: 121 mg/dL — ABNORMAL HIGH (ref 71–99)
Osmolality Calculated: 294 mOsm/kg (ref 275–300)
Potassium: 4.1 mMol/L (ref 3.5–5.3)
Protein, Total: 6.7 gm/dL (ref 6.0–8.3)
Sodium: 134 mMol/L — ABNORMAL LOW (ref 136–147)

## 2019-05-04 LAB — CBC AND DIFFERENTIAL
Basophils %: 0.7 % (ref 0.0–3.0)
Basophils Absolute: 0.1 10*3/uL (ref 0.0–0.3)
Eosinophils %: 0.2 % (ref 0.0–7.0)
Eosinophils Absolute: 0 10*3/uL (ref 0.0–0.8)
Hematocrit: 36.5 % (ref 36.0–48.0)
Hemoglobin: 11.3 gm/dL — ABNORMAL LOW (ref 12.0–16.0)
Lymphocytes Absolute: 1.2 10*3/uL (ref 0.6–5.1)
Lymphocytes: 13.8 % — ABNORMAL LOW (ref 15.0–46.0)
MCH: 28 pg (ref 28–35)
MCHC: 31 gm/dL (ref 31–36)
MCV: 91 fL (ref 80–100)
MPV: 8.8 fL (ref 6.0–10.0)
Monocytes Absolute: 1 10*3/uL (ref 0.1–1.7)
Monocytes: 11.2 % (ref 3.0–15.0)
Neutrophils %: 74.2 % (ref 42.0–78.0)
Neutrophils Absolute: 6.7 10*3/uL (ref 1.7–8.6)
PLT CT: 152 10*3/uL (ref 130–440)
RBC: 4.03 10*6/uL (ref 3.80–5.00)
RDW: 14.2 % (ref 10.5–14.5)
WBC: 9 10*3/uL (ref 4.0–11.0)

## 2019-05-04 LAB — B-TYPE NATRIURETIC PEPTIDE: B-Natriuretic Peptide: 533 pg/mL — ABNORMAL HIGH (ref 0.0–100.0)

## 2019-05-04 LAB — TROPONIN I: Troponin I: 0.03 ng/mL — ABNORMAL HIGH (ref 0.00–0.02)

## 2019-05-04 MED ORDER — FUROSEMIDE 10 MG/ML IJ SOLN
60.00 mg | Freq: Once | INTRAMUSCULAR | Status: AC
Start: 2019-05-04 — End: 2019-05-04
  Administered 2019-05-04: 23:00:00 60 mg via INTRAVENOUS

## 2019-05-04 MED ORDER — HYDROCODONE-ACETAMINOPHEN 5-325 MG PO TABS
ORAL_TABLET | ORAL | Status: AC
Start: 2019-05-04 — End: ?
  Filled 2019-05-04: qty 1

## 2019-05-04 MED ORDER — FUROSEMIDE 10 MG/ML IJ SOLN
INTRAMUSCULAR | Status: AC
Start: 2019-05-04 — End: ?
  Filled 2019-05-04: qty 8

## 2019-05-04 MED ORDER — HYDROCODONE-ACETAMINOPHEN 5-325 MG PO TABS
1.00 | ORAL_TABLET | Freq: Once | ORAL | Status: AC
Start: 2019-05-04 — End: 2019-05-04
  Administered 2019-05-04: 23:00:00 1 via ORAL

## 2019-05-04 NOTE — ED Notes (Signed)
Pt was assisted to [name] via manpower of 1. Pt assisted back to MS bed with minimal assistance. Bland Span NRP

## 2019-05-04 NOTE — ED Notes (Signed)
Patient was transferred to a MS bed via stand and pivot with minimal assistance. Patient is directed to call for assistance when she needs to use [facility] which is placed at right side of bed. Patient is dressed in pt gown and non-slip socks with call bell within reach. Bland Span NRP

## 2019-05-04 NOTE — ED Notes (Signed)
Repeat trop drawn and sent

## 2019-05-04 NOTE — ED Provider Notes (Signed)
History     Chief Complaint   Patient presents with   . Shortness of Breath     HPI   Presents the ED with shortness of breath.  Patient has been having increasing shortness of breath for a few weeks but it is been worse over the last couple of days she also has bilateral lower extremity swelling and some redness of both lower extremities.  She has a history of congestive heart failure and had plans to come in for an evaluation and likely hospitalization for further treatment of the congestive heart failure.  She does have a history of COPD and CAD along with A. fib and aortic disease.  She is not currently having chest pain.  No fevers.  Past Medical History:   Diagnosis Date   . Aortic aneurysm    . Atrial fibrillation    . Chronic obstructive pulmonary disease    . Coronary artery disease    . Gastroesophageal reflux disease    . Hypertension    . Malignant neoplasm    . Pacemaker    . SSS (sick sinus syndrome)        Past Surgical History:   Procedure Laterality Date   . CARDIAC PACEMAKER PLACEMENT      2002   . HYSTERECTOMY         Family History   Problem Relation Age of Onset   . Breast cancer Sister        Social  Social History     Tobacco Use   . Smoking status: Never Smoker   . Smokeless tobacco: Never Used   Substance Use Topics   . Alcohol use: No   . Drug use: No       .     Allergies   Allergen Reactions   . Adhesive [Wound Dressing Adhesive]    . Codeine Hives     unknown     . Fentanyl      Other reaction(s): psychological reaction  Hallucinations,    . Morphine    . Morphine And Related Hives     unknown     . Nitroglycerin      Other reaction(s): neurological reaction   . Pain Patch [Menthol]    . Sulfa Antibiotics      unknown       Home Medications             albuterol (PROVENTIL) (2.5 MG/3ML) 0.083% nebulizer solution     Take 2.5 mg by nebulization every 6 (six) hours as needed for Wheezing.     allopurinol (ZYLOPRIM) 100 MG tablet     Take 1 tablet (100 mg total) by mouth daily For  gout prevention     atenolol (TENORMIN) 50 MG tablet     TAKE 1 TABLET BY MOUTH EVERY DAY     diltiazem (TIAZAC) 240 MG 24 hr capsule     Take 240 mg by mouth 2 (two) times daily.        fluorometholone (EFLONE) 0.1 % ophthalmic suspension     Place 1 drop into the left eye 4 (four) times daily        fluorometholone (FML) 0.1 % ophthalmic suspension          fluticasone furoate-vilanterol (BREO ELLIPTA) 100-25 MCG/INH Aerosol Pwdr, Breath Activated     Inhale 1 puff into the lungs daily     furosemide (LASIX) 40 MG tablet     Take 1 tablet (40 mg total)  by mouth 2 (two) times daily     HYDROcodone-acetaminophen (NORCO) 5-325 MG per tablet     Take 1 tablet by mouth every 6 (six) hours as needed for Pain     ibuprofen (ADVIL) 400 MG tablet     Take 1 tablet (400 mg total) by mouth every 6 (six) hours as needed for Pain or Fever     latanoprost (XALATAN) 0.005 % ophthalmic solution     Place 1 drop into both eyes nightly        metOLazone (ZAROXOLYN) 2.5 MG tablet     Take 1 tablet (2.5 mg total) by mouth daily     potassium chloride (K-DUR) 10 MEQ tablet     Take 1 tablet (10 mEq total) by mouth daily     predniSONE (DELTASONE) 10 MG tablet     Take 4,3,2, and then stay on 1 tablet per day     rosuvastatin (CRESTOR) 10 MG tablet     Take 1 tablet (10 mg total) by mouth daily     spironolactone (ALDACTONE) 25 MG tablet     Take 1 tablet (25 mg total) by mouth daily     tiotropium (SPIRIVA RESPIMAT) 2.5 MCG/ACT inhalation spray     Inhale 2 puffs into the lungs daily     warfarin (COUMADIN) 3 MG tablet     Take 1 tablet (3 mg total) by mouth daily.     Patient taking differently: Take 5 mg by mouth daily 5mg  on Tuesday, and 2.5mg  the other days of the week.             Review of Systems   Constitutional: Positive for fatigue. Negative for chills.   HENT: Negative for congestion and ear discharge.    Eyes: Negative for pain and visual disturbance.   Respiratory: Positive for cough and shortness of breath. Negative for  wheezing.    Cardiovascular: Positive for leg swelling. Negative for chest pain.   Gastrointestinal: Negative for abdominal pain, nausea and vomiting.   Genitourinary: Negative for dysuria.   Musculoskeletal: Negative for back pain.   Skin: Negative for rash.   Allergic/Immunologic: Negative for immunocompromised state.   Neurological: Positive for weakness. Negative for dizziness and headaches.   Psychiatric/Behavioral: Negative for suicidal ideas.       Physical Exam    BP: (!) 145/91, Heart Rate: 74, Temp: 97.8 F (36.6 C), Resp Rate: (!) 24, SpO2: 98 %    Physical Exam  Vitals signs reviewed.   Constitutional:       General: She is not in acute distress.     Appearance: She is well-developed. She is not diaphoretic.   HENT:      Head: Normocephalic and atraumatic.   Eyes:      Pupils: Pupils are equal, round, and reactive to light.   Neck:      Musculoskeletal: Normal range of motion.      Vascular: No JVD.   Cardiovascular:      Rate and Rhythm: Normal rate.      Heart sounds: No murmur.   Pulmonary:      Effort: Pulmonary effort is normal. No respiratory distress.      Breath sounds: Examination of the right-lower field reveals decreased breath sounds and rales. Examination of the left-lower field reveals decreased breath sounds and rales. Decreased breath sounds and rales present.   Abdominal:      Palpations: Abdomen is soft.      Tenderness: There is  no abdominal tenderness.   Musculoskeletal: Normal range of motion.      Left lower leg: Edema present.   Skin:     General: Skin is warm and dry.      Findings: Erythema present.   Neurological:      Mental Status: She is alert and oriented to person, place, and time.      Cranial Nerves: No cranial nerve deficit.       EKG A. fib rate of 63 normal axis normal QT no ST segment elevation or depression there is a PVC couplets    MDM and ED Course     ED Medication Orders (From admission, onward)    Start Ordered     Status Ordering Provider    05/04/19 2204  05/04/19 2203  HYDROcodone-acetaminophen (NORCO) 5-325 MG per tablet 1 tablet  Once in ED     Route: Oral  Ordered Dose: 1 tablet     Last MAR action: Given Gwendolin Briel A    05/04/19 2111 05/04/19 2110  furosemide (LASIX) injection 60 mg  Once in ED     Route: Intravenous  Ordered Dose: 60 mg     Last MAR action: Given Violetta Lavalle A             MDM  Presented to ED with shortness of breath weakness and leg swelling.  Exam symptoms and history labs and imaging consistent with congestive heart failure.  She did have elevated troponin this was repeated it was unchanged she does not have chest pain.  Do not suspect acute coronary syndrome.  Patient will be hospitalized on the medicine service for further work-up and additional treatment.  She remained stable at this point.  She was treated with Lasix.               Procedures    Clinical Impression & Disposition     Clinical Impression  Final diagnoses:   Acute systolic congestive heart failure        ED Disposition     ED Disposition Condition Date/Time Comment    Admit  Mon May 05, 2019  2:01 AM [name] Hing to be admitted.    Condition at disposition: Stable             New Prescriptions    No medications on file                 Pennie Banter, DO  05/05/19 0202

## 2019-05-04 NOTE — ED Notes (Signed)
Blood specimen is sent to lab via tube system. Bland Span NRP

## 2019-05-04 NOTE — ED Notes (Signed)
Pt given sandwich and water for meal. Pt given denture cleanser tablet, mouthwash, toothbrush, and toothpaste. Patient directed to use call bell instead of getting up on her own to use BSC. Bland Span NRP

## 2019-05-05 ENCOUNTER — Encounter: Payer: Self-pay | Admitting: Internal Medicine

## 2019-05-05 ENCOUNTER — Encounter (RURAL_HEALTH_CENTER): Payer: Self-pay | Admitting: Internal Medicine

## 2019-05-05 DIAGNOSIS — I5033 Acute on chronic diastolic (congestive) heart failure: Secondary | ICD-10-CM | POA: Diagnosis present

## 2019-05-05 DIAGNOSIS — I872 Venous insufficiency (chronic) (peripheral): Secondary | ICD-10-CM | POA: Diagnosis present

## 2019-05-05 LAB — TROPONIN I: Troponin I: 0.03 ng/mL — ABNORMAL HIGH (ref 0.00–0.02)

## 2019-05-05 LAB — ECG 12-LEAD
Patient Age: 78 years
Q-T Interval(Corrected): 441 ms
Q-T Interval: 430 ms
QRS Axis: 9 deg
QRS Duration: 163 ms
T Axis: -65 years
Ventricular Rate: 63 //min

## 2019-05-05 LAB — PT/INR
PT INR: 2.1 — ABNORMAL HIGH (ref 0.9–1.2)
PT INR: 2.6 — ABNORMAL HIGH (ref 0.9–1.2)
PT: 22.2 s — ABNORMAL HIGH (ref 9.4–11.3)
PT: 26.4 s — ABNORMAL HIGH (ref 9.4–11.3)

## 2019-05-05 MED ORDER — FLUOROMETHOLONE 0.1 % OP SUSP
1.00 [drp] | Freq: Four times a day (QID) | OPHTHALMIC | Status: DC
Start: 2019-05-05 — End: 2019-05-05
  Administered 2019-05-05: 10:00:00 1 [drp] via OPHTHALMIC
  Filled 2019-05-05: qty 5

## 2019-05-05 MED ORDER — PREDNISONE 10 MG PO TABS
10.0000 mg | ORAL_TABLET | Freq: Every day | ORAL | Status: DC
Start: 2019-05-05 — End: 2019-05-05
  Administered 2019-05-05: 10:00:00 10 mg via ORAL

## 2019-05-05 MED ORDER — FLUTICASONE FUROATE-VILANTEROL 100-25 MCG/INH IN AEPB
1.00 | INHALATION_SPRAY | Freq: Every morning | RESPIRATORY_TRACT | Status: DC
Start: 2019-05-05 — End: 2019-05-11
  Administered 2019-05-05 – 2019-05-11 (×7): 1 via RESPIRATORY_TRACT
  Filled 2019-05-05 (×2): qty 14

## 2019-05-05 MED ORDER — WARFARIN SODIUM 5 MG PO TABS
2.5000 mg | ORAL_TABLET | ORAL | Status: DC
Start: 2019-05-05 — End: 2019-05-11
  Administered 2019-05-05 – 2019-05-10 (×5): 2.5 mg via ORAL
  Filled 2019-05-05 (×5): qty 1

## 2019-05-05 MED ORDER — SODIUM CHLORIDE (PF) 0.9 % IJ SOLN
3.00 mL | Freq: Three times a day (TID) | INTRAMUSCULAR | Status: DC
Start: 2019-05-05 — End: 2019-05-11
  Administered 2019-05-05 – 2019-05-11 (×18): 3 mL via INTRAVENOUS
  Filled 2019-05-05 (×8): qty 10

## 2019-05-05 MED ORDER — HYDROCODONE-ACETAMINOPHEN 5-325 MG PO TABS
1.0000 | ORAL_TABLET | Freq: Four times a day (QID) | ORAL | Status: DC | PRN
Start: 2019-05-05 — End: 2019-05-05
  Administered 2019-05-05: 1 via ORAL

## 2019-05-05 MED ORDER — ATENOLOL 50 MG PO TABS
50.0000 mg | ORAL_TABLET | Freq: Every day | ORAL | Status: DC
Start: 2019-05-05 — End: 2019-05-05
  Administered 2019-05-05: 10:00:00 50 mg via ORAL

## 2019-05-05 MED ORDER — HYDROCODONE-ACETAMINOPHEN 5-325 MG PO TABS
1.0000 | ORAL_TABLET | Freq: Three times a day (TID) | ORAL | Status: DC
Start: 2019-05-05 — End: 2019-05-06
  Administered 2019-05-05 – 2019-05-06 (×3): 1 via ORAL
  Filled 2019-05-05 (×3): qty 1

## 2019-05-05 MED ORDER — MELATONIN 3 MG PO TABS
3.0000 mg | ORAL_TABLET | Freq: Every evening | ORAL | Status: DC | PRN
Start: 2019-05-05 — End: 2019-05-11
  Administered 2019-05-10: 21:00:00 3 mg via ORAL
  Filled 2019-05-05: qty 1

## 2019-05-05 MED ORDER — ATENOLOL 50 MG PO TABS
ORAL_TABLET | ORAL | Status: AC
Start: 2019-05-05 — End: ?
  Filled 2019-05-05: qty 1

## 2019-05-05 MED ORDER — FUROSEMIDE 10 MG/ML IJ SOLN
40.00 mg | Freq: Two times a day (BID) | INTRAMUSCULAR | Status: DC
Start: 2019-05-05 — End: 2019-05-06
  Administered 2019-05-05 – 2019-05-06 (×3): 40 mg via INTRAVENOUS
  Filled 2019-05-05 (×2): qty 4

## 2019-05-05 MED ORDER — METOLAZONE 5 MG PO TABS
5.0000 mg | ORAL_TABLET | Freq: Every day | ORAL | Status: DC
Start: 2019-05-06 — End: 2019-05-06
  Administered 2019-05-06: 09:00:00 5 mg via ORAL
  Filled 2019-05-05: qty 1

## 2019-05-05 MED ORDER — HYDROCODONE-ACETAMINOPHEN 5-325 MG PO TABS
1.0000 | ORAL_TABLET | ORAL | Status: DC
Start: 2019-05-05 — End: 2019-05-05

## 2019-05-05 MED ORDER — ROSUVASTATIN CALCIUM 10 MG PO TABS
10.0000 mg | ORAL_TABLET | Freq: Every day | ORAL | Status: DC
Start: 2019-05-05 — End: 2019-05-11
  Administered 2019-05-05 – 2019-05-11 (×7): 10 mg via ORAL
  Filled 2019-05-05 (×8): qty 1

## 2019-05-05 MED ORDER — WARFARIN SODIUM 5 MG PO TABS
5.0000 mg | ORAL_TABLET | ORAL | Status: DC
Start: 2019-05-06 — End: 2019-05-11
  Administered 2019-05-06: 17:00:00 5 mg via ORAL
  Filled 2019-05-05: qty 1

## 2019-05-05 MED ORDER — VH DILTIAZEM HCL ER BEADS 120 MG PO CP24 (WRAP)
240.00 mg | ORAL_CAPSULE | Freq: Two times a day (BID) | ORAL | Status: DC
Start: 2019-05-05 — End: 2019-05-11
  Administered 2019-05-05 – 2019-05-11 (×13): 240 mg via ORAL
  Filled 2019-05-05 (×15): qty 2

## 2019-05-05 MED ORDER — HYDROXYZINE HCL 10 MG PO TABS
10.0000 mg | ORAL_TABLET | Freq: Four times a day (QID) | ORAL | Status: DC | PRN
Start: 2019-05-05 — End: 2019-05-11
  Administered 2019-05-05 – 2019-05-10 (×2): 10 mg via ORAL
  Filled 2019-05-05 (×2): qty 1

## 2019-05-05 MED ORDER — ALLOPURINOL 100 MG PO TABS
100.0000 mg | ORAL_TABLET | Freq: Every day | ORAL | Status: DC
Start: 2019-05-05 — End: 2019-05-11
  Administered 2019-05-05 – 2019-05-11 (×7): 100 mg via ORAL
  Filled 2019-05-05 (×8): qty 1

## 2019-05-05 MED ORDER — VH SPIRONOLACTONE 25 MG PO TABS
25.0000 mg | ORAL_TABLET | Freq: Every day | ORAL | Status: DC
Start: 2019-05-05 — End: 2019-05-11
  Administered 2019-05-05 – 2019-05-11 (×7): 25 mg via ORAL
  Filled 2019-05-05 (×8): qty 1

## 2019-05-05 MED ORDER — FLUOROMETHOLONE 0.1 % OP SUSP
1.00 [drp] | Freq: Every day | OPHTHALMIC | Status: DC
Start: 2019-05-06 — End: 2019-05-11
  Administered 2019-05-06 – 2019-05-10 (×5): 1 [drp] via OPHTHALMIC

## 2019-05-05 MED ORDER — HYDROCODONE-ACETAMINOPHEN 5-325 MG PO TABS
ORAL_TABLET | ORAL | Status: AC
Start: 2019-05-05 — End: ?
  Filled 2019-05-05: qty 1

## 2019-05-05 MED ORDER — FUROSEMIDE 10 MG/ML IJ SOLN
INTRAMUSCULAR | Status: AC
Start: 2019-05-05 — End: ?
  Filled 2019-05-05: qty 4

## 2019-05-05 MED ORDER — TIOTROPIUM BROMIDE MONOHYDRATE 2.5 MCG/ACT IN AERS
2.00 | INHALATION_SPRAY | Freq: Every morning | RESPIRATORY_TRACT | Status: DC
Start: 2019-05-05 — End: 2019-05-11
  Administered 2019-05-05 – 2019-05-11 (×7): 2 via RESPIRATORY_TRACT
  Filled 2019-05-05 (×2): qty 1

## 2019-05-05 MED ORDER — VH POTASSIUM CHLORIDE CRYS ER 20 MEQ PO TBCR (WRAP)
EXTENDED_RELEASE_TABLET | ORAL | Status: AC
Start: 2019-05-05 — End: ?
  Filled 2019-05-05: qty 1

## 2019-05-05 MED ORDER — VH WARFARIN THERAPY PLACEHOLDER
1.00 | Status: DC
Start: 2019-05-05 — End: 2019-05-11

## 2019-05-05 MED ORDER — PREDNISONE 10 MG PO TABS
ORAL_TABLET | ORAL | Status: AC
Start: 2019-05-05 — End: ?
  Filled 2019-05-05: qty 1

## 2019-05-05 MED ORDER — ACETAMINOPHEN 325 MG PO TABS
650.0000 mg | ORAL_TABLET | ORAL | Status: DC | PRN
Start: 2019-05-05 — End: 2019-05-11

## 2019-05-05 MED ORDER — LEVALBUTEROL HCL 1.25 MG/3ML IN NEBU
1.25 mg | INHALATION_SOLUTION | Freq: Four times a day (QID) | RESPIRATORY_TRACT | Status: DC | PRN
Start: 2019-05-05 — End: 2019-05-11
  Administered 2019-05-06 – 2019-05-10 (×2): 1.25 mg via RESPIRATORY_TRACT
  Filled 2019-05-05 (×2): qty 3

## 2019-05-05 MED ORDER — VH POTASSIUM CHLORIDE CRYS ER 10 MEQ PO TBCR (WRAP)
10.00 meq | EXTENDED_RELEASE_TABLET | Freq: Three times a day (TID) | ORAL | Status: DC
Start: 2019-05-05 — End: 2019-05-07
  Administered 2019-05-05 – 2019-05-07 (×6): 10 meq via ORAL
  Filled 2019-05-05 (×7): qty 1

## 2019-05-05 MED ORDER — VH POTASSIUM CHLORIDE CRYS ER 10 MEQ PO TBCR (WRAP)
10.00 meq | EXTENDED_RELEASE_TABLET | Freq: Every day | ORAL | Status: DC
Start: 2019-05-05 — End: 2019-05-05
  Administered 2019-05-05: 10:00:00 10 meq via ORAL

## 2019-05-05 MED ORDER — PREDNISONE 5 MG PO TABS
5.0000 mg | ORAL_TABLET | Freq: Every day | ORAL | Status: DC
Start: 2019-05-06 — End: 2019-05-11
  Administered 2019-05-06 – 2019-05-11 (×6): 5 mg via ORAL
  Filled 2019-05-05 (×6): qty 1

## 2019-05-05 MED ORDER — METOLAZONE 2.5 MG PO TABS
2.5000 mg | ORAL_TABLET | Freq: Every day | ORAL | Status: DC
Start: 2019-05-05 — End: 2019-05-05
  Administered 2019-05-05: 10:00:00 2.5 mg via ORAL
  Filled 2019-05-05 (×2): qty 1

## 2019-05-05 MED ORDER — LATANOPROST 0.005 % OP SOLN
1.00 [drp] | Freq: Every evening | OPHTHALMIC | Status: DC
Start: 2019-05-05 — End: 2019-05-11
  Administered 2019-05-05 – 2019-05-10 (×7): 1 [drp] via OPHTHALMIC
  Filled 2019-05-05 (×2): qty 1

## 2019-05-05 MED ORDER — SODIUM CHLORIDE (PF) 0.9 % IJ SOLN
0.40 mg | INTRAMUSCULAR | Status: DC | PRN
Start: 2019-05-05 — End: 2019-05-11

## 2019-05-05 NOTE — ED Notes (Signed)
Assisted pt to BSC.

## 2019-05-05 NOTE — Progress Notes (Signed)
Do not send this

## 2019-05-05 NOTE — ED Notes (Signed)
Pt assisted with manpower of 1 to the chair where she is seated with call bell beside her. Bland Span NRP

## 2019-05-05 NOTE — ED Notes (Signed)
Pt is Aox4, RR shallow with insp/exsp wheezing, pt administered albuterol  with marginal effect pta. Skin is warm and dry, (+)PERRL, neuro intact, distal pulses movement and sensation intact, no trauma noted.+3 pitting Bilateral lower extremities.

## 2019-05-05 NOTE — H&P (Signed)
Medicine History & Physical   [facility]  [employer]   Patient Name: Ulatowski,Shawntavia [name] LOS: 0 days   Attending Physician: Pennie Banter, DO PCP: Graciela Husbands, DO      Assessment and Plan:                                                                1.  Congestive heart failure, acute on chronic, diastolic.  Patient is placed on observation status, in the medical surgical unit, on telemetry.  Lasix 40 mg IV twice daily.  Continue metolazone 2.5 mg daily, before the dose of Lasix.  Continue spironolactone 25 mg daily.  Patient has had a relatively recent echocardiogram.  Oxygen saturations are reasonable, but the patient is significantly short of breath.    2.  Atrial fibrillation, chronic persistent.  Rate control with atenolol 50 mg daily and diltiazem CD 240 mg daily.  Continue Coumadin (current dose is 2.5 mg daily, except 5 mg on Tuesdays).  Monitor INR    3.  History of gout.  Continue allopurinol 100 mg daily.  It appears patient is on chronic prednisone 10 mg daily.    4.  COPD.  Does not seem to be in exacerbation.  Xopenex nebulizers as necessary.  Continue Brio Ellipta 100/25 1 puff daily, and Spiriva.  Patient appears to be on chronic prednisone 10 mg daily.    5.  Hyperlipidemia.  Continue rosuvastatin 10 mg daily.    6.  Coronary artery disease.  Coumadin in place of aspirin.  Control of blood pressure and blood cholesterol as outlined above.    7.  Glaucoma.  Patient's chronic eyedrops will be continued.    8.  Chronic respiratory failure, on home oxygen, 4 L nasal cannula per notes of primary care physician    DVT PPx: Coumadin  Dispo: Observation status.  Post discharge disposition to be determined.  Healthcare Proxy: Patient's daughters  Code: DNAR with support (patient would want interventions "that would help", but it would be hard to imagine patient is surviving cardiac arrest code procedures, and she did not desire mechanical ventilation.  Previous admissions had been  DNR).     History of Presenting Illness                                CC: Shortness of breath  Dariela Winklepleck is a 79 y.o. female patient, past medical history significant for COPD, CHF, chronic respiratory failure secondary to these, and atrial fibrillation.  The patient presented to the emergency department at [facility], reporting shortness of breath and leg swelling, worse over the past week.  No known weight changes.  Patient denies chest pain.  In the emergency department, BNP was elevated at 533 (was 198.9 on 04/11/2019).  Creatinine somewhat above baseline at 1.77 (was 1.24 on 04/29/2019, increase could be cardiorenal).  Oxygen saturations in the high 90s, but patient was only able to speak in short sentences.  60 mg IV Lasix were administered in the emergency department, along with 1 hydrocodone  Records reviewed, including emergency department records and prior hospital records.  Patient was most recently admitted from 04/11/2019 through 04/13/2019, for an exacerbation of diastolic  heart failure.  Echocardiogram on 01/28/2019 demonstrates ejection fraction of 55 to 60%, unable to assess diastolic function due to atrial fibrillation.  Severe biatrial enlargement, along with moderate to severe tricuspid regurgitation.  Past Medical History:   Diagnosis Date   . Aortic aneurysm    . Atrial fibrillation    . Chronic obstructive pulmonary disease    . Coronary artery disease    . Gastroesophageal reflux disease    . Hypertension    . Malignant neoplasm    . Pacemaker    . SSS (sick sinus syndrome)      Past Surgical History:   Procedure Laterality Date   . CARDIAC PACEMAKER PLACEMENT      2002   . HYSTERECTOMY         Family History   Problem Relation Age of Onset   . Breast cancer Sister      Social History     Tobacco Use   . Smoking status: Never Smoker   . Smokeless tobacco: Never Used   Substance Use Topics   . Alcohol use: No   . Drug use: No       Subjective   Review of Systems:  14  point review of systems significant for the following: No headache or history of seizures.  Shortness of breath, without chest pain, as noted above.  No bowel or bladder habit changes, except to note "I stay constipated".  No fevers or chills.  No eye ear nose or throat problems, except for chronic hardness of hearing.  The patient also reports eye problems, she is followed by ophthalmology.  No environmental allergies or rash.        Objective   Physical Exam:     Vitals: T:97.8 F (36.6 C) (Tympanic),  BP:131/83, HR:85, RR:18, SaO2:98%    1) General Appearance: Alert and oriented x 4. In no acute distress, though speaks in short sentences.   2) Eyes: Pink conjunctiva, anicteric sclera. Pupils are equally reactive to light.  (per exam of Dr Minus Liberty)      3) ENT: Oral mucosa moist with no pharyngeal congestion, erythema or swelling.  (per exam of Dr Minus Liberty)    4) Neck: Supple, with full range of motion. Trachea is central, no JVD noted  (per exam of Dr Minus Liberty)    5) Chest: Clear to auscultation bilaterally, no wheezes rales or rhonchi.  6) CVS: normal rate and regular rhythm, with no murmurs rubs or gallops.  7) Abdomen: Soft, non-tender, no rebound guarding or palpable mass. Bowel sounds normal. No CVA tenderness  8) Extremities: 3 - pedal pitting edema, pulses palpable, no calf swelling and no gross deformity.  (per exam of Dr Minus Liberty)    9) Skin: Warm, dry with normal skin turgor, no rash  (per exam of Dr Minus Liberty)    10) Lymphatics: No lymphadenopathy in cervical area.   (per exam of Dr Minus Liberty)    11) Neurological: Cranial nerves II-XII intact. No gross focal motor or sensory deficits noted.  12) Psychiatric: Affect is appropriate. No hallucinations.    EKG: Per my review shows atrial fibrillation at a rate of 63.  Occasional paced beat.  Minor T wave irregularities noted.  Compared to the prior EKG of 04/13/2019, tracing is essentially unchanged  Chest Xray: Per my review shows cardiomegaly, and presence of a 2-lead  pacemaker.  No definite infiltrate    Patient Vitals for the past 12 hrs:   BP Temp Pulse Resp   05/04/19  2231 131/83 - 85 18   05/04/19 2226 126/86 - 69 20   05/04/19 2018 (!) 145/91 97.8 F (36.6 C) 74 (!) 24     Weight Monitoring 03/01/2019 03/06/2019 04/11/2019 04/11/2019 04/12/2019 04/13/2019 04/29/2019   Height - 165.1 cm 165.1 cm 160 cm - - 165.1 cm   Height Method - - Stated - - - -   Weight 73.9 kg 75.297 kg 76.658 kg 79.5 kg 77.4 kg 76.3 kg 79.017 kg   Weight Method Standing Scale - Stated - Standing Scale Standing Scale -   BMI (calculated) - 27.7 kg/m2 28.2 kg/m2 31.1 kg/m2 - - 29 kg/m2          Recent Results (from the past 24 hour(s))   B-type Natriuretic Peptide    Collection Time: 05/04/19  8:23 PM   Result Value Ref Range    B-Natriuretic Peptide 533.0 (H) 0.0 - 100.0 pg/mL   CBC and differential    Collection Time: 05/04/19  8:23 PM   Result Value Ref Range    WBC 9.0 4.0 - 11.0 K/cmm    RBC 4.03 3.80 - 5.00 M/cmm    Hemoglobin 11.3 (L) 12.0 - 16.0 gm/dL    Hematocrit 78.2 95.6 - 48.0 %    MCV 91 80 - 100 fL    MCH 28 28 - 35 pg    MCHC 31 31 - 36 gm/dL    RDW 21.3 08.6 - 57.8 %    PLT CT 152 130 - 440 K/cmm    MPV 8.8 6.0 - 10.0 fL    Neutrophils % 74.2 42.0 - 78.0 %    Lymphocytes 13.8 (L) 15.0 - 46.0 %    Monocytes 11.2 3.0 - 15.0 %    Eosinophils % 0.2 0.0 - 7.0 %    Basophils % 0.7 0.0 - 3.0 %    Neutrophils Absolute 6.7 1.7 - 8.6 K/cmm    Lymphocytes Absolute 1.2 0.6 - 5.1 K/cmm    Monocytes Absolute 1.0 0.1 - 1.7 K/cmm    Eosinophils Absolute 0.0 0.0 - 0.8 K/cmm    Basophils Absolute 0.1 0.0 - 0.3 K/cmm   Comprehensive metabolic panel    Collection Time: 05/04/19  8:23 PM   Result Value Ref Range    Sodium 134 (L) 136 - 147 mMol/L    Potassium 4.1 3.5 - 5.3 mMol/L    Chloride 90 (L) 98 - 110 mMol/L    CO2 31.0 (H) 20.0 - 30.0 mMol/L    Calcium 10.5 8.5 - 10.5 mg/dL    Glucose 469 (H) 71 - 99 mg/dL    Creatinine 6.29 (H) 0.60 - 1.20 mg/dL    BUN 80 (H) 7 - 22 mg/dL    Protein, Total 6.7 6.0 -  8.3 gm/dL    Albumin 4.1 3.5 - 5.0 gm/dL    Alkaline Phosphatase 132 40 - 145 U/L    ALT 21 0 - 55 U/L    AST (SGOT) 28 10 - 42 U/L    Bilirubin, Total 0.6 0.1 - 1.2 mg/dL    Albumin/Globulin Ratio 1.58 0.80 - 2.00 Ratio    Anion Gap 17.1 7.0 - 18.0 mMol/L    BUN / Creatinine Ratio 45.2 (H) 10.0 - 30.0 Ratio    EGFR 27 (L) 60 - 150 mL/min/1.31m2    Osmolality Calculated 294 275 - 300 mOsm/kg    Globulin 2.6 2.0 - 4.0 gm/dL   Troponin I    Collection Time: 05/04/19  8:23 PM  Result Value Ref Range    Troponin I 0.03 (H) 0.00 - 0.02 ng/mL   ECG 12 lead    Collection Time: 05/04/19  8:58 PM   Result Value Ref Range    Patient Age 79 years    Patient DOB 18-Mar-1941     Patient Height      Patient Weight      Interpretation Text       Atrial fibrillation  LVH with secondary repolarization abnormality  Baseline wander in lead(s) V6  Compared to ECG 04/13/2019 16:31:34  Left ventricular hypertrophy now present  Early repolarization now present      Physician Interpreter      Ventricular Rate 63 //min    QRS Duration 163 ms    P-R Interval  ms    Q-T Interval 430 ms    Q-T Interval(Corrected) 441 ms    P Wave Axis  deg    QRS Axis 9 deg    T Axis -65 years   Troponin I    Collection Time: 05/04/19 11:43 PM   Result Value Ref Range    Troponin I 0.03 (H) 0.00 - 0.02 ng/mL        Allergies   Allergen Reactions   . Adhesive [Wound Dressing Adhesive]    . Codeine Hives     unknown     . Fentanyl      Other reaction(s): psychological reaction  Hallucinations,    . Morphine    . Morphine And Related Hives     unknown     . Nitroglycerin      Other reaction(s): neurological reaction   . Pain Patch [Menthol]    . Sulfa Antibiotics      unknown      Xr Chest Ap Portable    Result Date: 05/04/2019  Stable chest with cardiomegaly and bibasilar atelectasis. Stable aneurysmal thoracic aortic appearance. Elevated left hemidiaphragm. No pneumothorax. Stable left subclavian pacer. Diffuse osteopenia. ReadingStation:[id number]     Home  Medications             albuterol (PROVENTIL) (2.5 MG/3ML) 0.083% nebulizer solution     Take 2.5 mg by nebulization every 6 (six) hours as needed for Wheezing.     allopurinol (ZYLOPRIM) 100 MG tablet     Take 1 tablet (100 mg total) by mouth daily For gout prevention     atenolol (TENORMIN) 50 MG tablet     TAKE 1 TABLET BY MOUTH EVERY DAY     diltiazem (TIAZAC) 240 MG 24 hr capsule     Take 240 mg by mouth 2 (two) times daily.        fluorometholone (EFLONE) 0.1 % ophthalmic suspension     Place 1 drop into the left eye 4 (four) times daily        fluorometholone (FML) 0.1 % ophthalmic suspension          fluticasone furoate-vilanterol (BREO ELLIPTA) 100-25 MCG/INH Aerosol Pwdr, Breath Activated     Inhale 1 puff into the lungs daily     furosemide (LASIX) 40 MG tablet     Take 1 tablet (40 mg total) by mouth 2 (two) times daily     HYDROcodone-acetaminophen (NORCO) 5-325 MG per tablet     Take 1 tablet by mouth every 6 (six) hours as needed for Pain     ibuprofen (ADVIL) 400 MG tablet     Take 1 tablet (400 mg total) by mouth every 6 (six) hours as needed  for Pain or Fever     latanoprost (XALATAN) 0.005 % ophthalmic solution     Place 1 drop into both eyes nightly        metOLazone (ZAROXOLYN) 2.5 MG tablet     Take 1 tablet (2.5 mg total) by mouth daily     potassium chloride (K-DUR) 10 MEQ tablet     Take 1 tablet (10 mEq total) by mouth daily     predniSONE (DELTASONE) 10 MG tablet     Take 4,3,2, and then stay on 1 tablet per day     rosuvastatin (CRESTOR) 10 MG tablet     Take 1 tablet (10 mg total) by mouth daily     spironolactone (ALDACTONE) 25 MG tablet     Take 1 tablet (25 mg total) by mouth daily     tiotropium (SPIRIVA RESPIMAT) 2.5 MCG/ACT inhalation spray     Inhale 2 puffs into the lungs daily     warfarin (COUMADIN) 3 MG tablet     Take 1 tablet (3 mg total) by mouth daily.     Patient taking differently: Take 5 mg by mouth daily 5mg  on Tuesday, and 2.5mg  the other days of the week.            Meds given in the ED:  Medications   furosemide (LASIX) injection 60 mg (60 mg Intravenous Given 05/04/19 2237)   HYDROcodone-acetaminophen (NORCO) 5-325 MG per tablet 1 tablet (1 tablet Oral Given 05/04/19 2237)   HYDROcodone-acetaminophen (NORCO) 5-325 MG per tablet (has no administration in time range)   furosemide (LASIX) 10 MG/ML injection (has no administration in time range)      Time Spent: 80 minutes     Charolotte Eke, MD     05/05/19,1:59 AM   MRN: [id number]                                      CSN: [id number] DOB: [birth year]     Referring Physician: _____David Minus Liberty DO_____________________  Consulting Physician: ____Edward Melanee Spry MD_____________________   / [employer]      Location of patient: ED at [facility]  Location of the provider: [facility]  Names of all persons participating in the telemedicine service and their role in the encounter:  Name:  ________________________         Role: _____RN______________________  Name:   ________________________        Role: ___________________________  Type of evaluation performed: Admission from ED  *Please note this service was provided using telemedicine

## 2019-05-05 NOTE — ED Notes (Signed)
Pt assisted up to BSC to void

## 2019-05-05 NOTE — Progress Note - Problem Oriented Charting Notewrit (Signed)
Cross cover note:    Patient was requesting medicine for sleep.  Will begin melatonin 3 mg 1 p.o. at bedtime as necessary.    Anxiety also reported.  Trial Atarax 10 mg 1 p.o. every 6 hours as necessary.

## 2019-05-06 DIAGNOSIS — L03115 Cellulitis of right lower limb: Secondary | ICD-10-CM | POA: Diagnosis present

## 2019-05-06 DIAGNOSIS — N1831 Chronic kidney disease, stage 3a: Secondary | ICD-10-CM | POA: Diagnosis present

## 2019-05-06 DIAGNOSIS — I5033 Acute on chronic diastolic (congestive) heart failure: Secondary | ICD-10-CM

## 2019-05-06 LAB — BASIC METABOLIC PANEL
Anion Gap: 14.6 mMol/L (ref 7.0–18.0)
BUN / Creatinine Ratio: 58.5 Ratio — ABNORMAL HIGH (ref 10.0–30.0)
BUN: 72 mg/dL — ABNORMAL HIGH (ref 7–22)
CO2: 36 mMol/L — ABNORMAL HIGH (ref 20.0–30.0)
Calcium: 10.4 mg/dL (ref 8.5–10.5)
Chloride: 88 mMol/L — ABNORMAL LOW (ref 98–110)
Creatinine: 1.23 mg/dL — ABNORMAL HIGH (ref 0.60–1.20)
EGFR: 42 mL/min/{1.73_m2} — ABNORMAL LOW (ref 60–150)
Glucose: 116 mg/dL — ABNORMAL HIGH (ref 71–99)
Osmolality Calculated: 292 mOsm/kg (ref 275–300)
Potassium: 3.6 mMol/L (ref 3.5–5.3)
Sodium: 135 mMol/L — ABNORMAL LOW (ref 136–147)

## 2019-05-06 LAB — CBC AND DIFFERENTIAL
Basophils %: 0.8 % (ref 0.0–3.0)
Basophils Absolute: 0.1 10*3/uL (ref 0.0–0.3)
Eosinophils %: 0 % (ref 0.0–7.0)
Eosinophils Absolute: 0 10*3/uL (ref 0.0–0.8)
Hematocrit: 35.4 % — ABNORMAL LOW (ref 36.0–48.0)
Hemoglobin: 10.9 gm/dL — ABNORMAL LOW (ref 12.0–16.0)
Lymphocytes Absolute: 1.1 10*3/uL (ref 0.6–5.1)
Lymphocytes: 14 % — ABNORMAL LOW (ref 15.0–46.0)
MCH: 28 pg (ref 28–35)
MCHC: 31 gm/dL (ref 31–36)
MCV: 91 fL (ref 80–100)
MPV: 7.2 fL (ref 6.0–10.0)
Monocytes Absolute: 0.8 10*3/uL (ref 0.1–1.7)
Monocytes: 10.7 % (ref 3.0–15.0)
Neutrophils %: 74.5 % (ref 42.0–78.0)
Neutrophils Absolute: 5.8 10*3/uL (ref 1.7–8.6)
PLT CT: 139 10*3/uL (ref 130–440)
RBC: 3.91 10*6/uL (ref 3.80–5.00)
RDW: 14.5 % (ref 10.5–14.5)
WBC: 7.8 10*3/uL (ref 4.0–11.0)

## 2019-05-06 LAB — PT/INR
PT INR: 2.7 — ABNORMAL HIGH (ref 0.9–1.2)
PT: 27.9 s — ABNORMAL HIGH (ref 9.4–11.3)

## 2019-05-06 LAB — C-REACTIVE PROTEIN: C-Reactive Protein: 0.9 mg/dL — ABNORMAL HIGH (ref 0.02–0.80)

## 2019-05-06 MED ORDER — OXYCODONE-ACETAMINOPHEN 5-325 MG PO TABS
1.0000 | ORAL_TABLET | ORAL | Status: DC | PRN
Start: 2019-05-06 — End: 2019-05-11
  Administered 2019-05-06 – 2019-05-11 (×4): 1 via ORAL
  Filled 2019-05-06 (×4): qty 1

## 2019-05-06 MED ORDER — DOCUSATE SODIUM 100 MG PO CAPS
200.00 mg | ORAL_CAPSULE | Freq: Every day | ORAL | Status: DC
Start: 2019-05-06 — End: 2019-05-11
  Administered 2019-05-06 – 2019-05-11 (×6): 200 mg via ORAL
  Filled 2019-05-06 (×6): qty 2

## 2019-05-06 MED ORDER — ALBUTEROL SULFATE HFA 108 (90 BASE) MCG/ACT IN AERS
1.00 | INHALATION_SPRAY | RESPIRATORY_TRACT | Status: DC | PRN
Start: 2019-05-06 — End: 2019-05-11

## 2019-05-06 MED ORDER — CYCLOBENZAPRINE HCL 10 MG PO TABS
5.0000 mg | ORAL_TABLET | Freq: Three times a day (TID) | ORAL | Status: DC | PRN
Start: 2019-05-06 — End: 2019-05-11
  Administered 2019-05-06: 13:00:00 5 mg via ORAL
  Filled 2019-05-06: qty 1

## 2019-05-06 MED ORDER — OXYCODONE HCL ER 10 MG PO T12A
10.00 mg | EXTENDED_RELEASE_TABLET | Freq: Two times a day (BID) | ORAL | Status: DC
Start: 2019-05-06 — End: 2019-05-11
  Administered 2019-05-06 – 2019-05-11 (×11): 10 mg via ORAL
  Filled 2019-05-06 (×11): qty 1

## 2019-05-06 MED ORDER — POLYETHYLENE GLYCOL 3350 17 G PO PACK
17.00 g | PACK | Freq: Every day | ORAL | Status: DC
Start: 2019-05-06 — End: 2019-05-11
  Administered 2019-05-06 – 2019-05-11 (×6): 17 g via ORAL
  Filled 2019-05-06 (×6): qty 1

## 2019-05-06 MED ORDER — VH BIO-K PLUS PROBIOTIC 50 BIL CFU CAPSULE
50.00 | DELAYED_RELEASE_CAPSULE | Freq: Every day | ORAL | Status: DC
Start: 2019-05-06 — End: 2019-05-11
  Administered 2019-05-06 – 2019-05-11 (×6): 50 via ORAL
  Filled 2019-05-06 (×6): qty 1

## 2019-05-06 MED ORDER — CEFTRIAXONE SODIUM 1 G IJ SOLR
1.00 g | Freq: Two times a day (BID) | INTRAMUSCULAR | Status: DC
Start: 2019-05-06 — End: 2019-05-11
  Administered 2019-05-06 – 2019-05-11 (×10): 1 g via INTRAVENOUS
  Filled 2019-05-06 (×11): qty 1000

## 2019-05-06 NOTE — Progress Notes (Signed)
Patient is an 79 year old female admitted to observation status for a diagnosis of CHF exacerbation. Upon entering room, patient is noted to be resting in bed watching TV and finishing up with her breakfast. She is noted to be alert and oriented x4 during our conversation. Per patient, she lives at home with her daughter and son in law. They live in a 1 story home with a ramped entrance. Prior to hospitalization, patient was able to do her own sponge bath. She states that her son in law takes care of the house work, but her daughter does the laundry, shopping and cooking. Her son in law or a friend provide transportation to her appointments. She is noted to have a SPC, FWW, BSC, hospital bed and home as well as portable oxygen at home. Patient states that she gets her oxygen through [employer]. She confirms that she has MCR and FEP BCBS. Dr. Amada Jupiter is her PCP and she last saw him on 04/29/19. [facility] uses [facility] for her prescription needs. CM did confirm with her that she has still been using [facility] services and she states that they are still calling her weekly except for the weeks of the holidays. CM will reach out to them and let them know that she is in the hospital and to follow up after discharge. She states that at time of discharge, her son in law or daughter will be able to provide transportation home. At present all questions have been answered. CM will continue to follow and intervene as necessary.          05/06/19 0846   Patient Type   Within 30 Days of Previous Admission? Yes  (04/11/19-04/13/19 PMH)   Healthcare Decisions   Interviewed: Patient   Orientation/Decision Making Abilities of Patient Alert and Oriented x3, able to make decisions   Level of Function 2 Weeks Prior to Admission   Prior level of function Independent with ADLs;Ambulates with assistive device   Level of Function Upon Admission   Current Level of Function Ambulates with assistive device;Up to chair with assistance;Needs assistance with ADLs    Prior to admission   Type of Residence Private residence   Home Layout One level;Ramped entrance   Have running water, electricity, heat, etc? Yes   Living Arrangements Children;Family members   How do you get to your MD appointments? son in law   How do you get your groceries? daughter   Who fixes your meals? daughter   Who does your laundry? daughter   Who picks up your prescriptions? 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 No   Discharge Planning   Support Systems Children;Family members;Friends/neighbors   Patient expects to be discharged to: home   Anticipated  plan discussed with: Same as interviewed   Mode of transportation: Private car (family member)   Does the patient have perscription coverage? Yes   Consults/Providers   Correct PCP listed in Epic? Yes   Family and PCP   PCP on file was verified as the current PCP? Yes   Important Message from [facility] Notice   Patient received 1st IMM Letter? n/a

## 2019-05-06 NOTE — Progress Notes (Signed)
Readmission Risk  [facility] - MEDICAL/SURGICAL   Patient Name: [name]   Attending Physician: Graciela Husbands, DO   Today's date:   05/06/2019 LOS: 0 days   Expected Discharge Date Expected Discharge Date: (P) 05/08/19    Readmission Assessment:                                                              Discharge Planning  Expected Discharge Date: (P) 05/08/19  Does the patient have perscription coverage?: (P) Yes  Confirmed PCP with Pt: (P) Yes  Confirmed PCP name: (P) Dr. Amada Jupiter  Last PCP Visit Date: (P) 04/29/19  Confirm Transport to F/U Appt.: (P) Self/Private Vehicle/Friend  Anticipated Home Health at [place]: (P) No  Anticipated Placement at [place]: (P) No  CM Comments: (P) 1/5~Referral sent to [facility] On home oxygen through [employer]. Hospital bed was delivered last week       IDPA:   Patient Type  Within 30 Days of Previous Admission?: Yes(04/11/19-04/13/19 PMH)  Healthcare Decisions  Interviewed:: Patient  Orientation/Decision Making Abilities of Patient: Alert and Oriented x3, able to make decisions  Advance Directive: Patient has advance directive, copy in chart  Healthcare Agent Appointed: Yes  Prior to admission  Prior level of function: Independent with ADLs, Ambulates with assistive device  Type of Residence: Private residence  Home Layout: One level, Ramped entrance  Have running water, electricity, heat, etc?: Yes  Living Arrangements: Children, Family members  How do you get to your MD appointments?: son in law  How do you get your groceries?: daughter  Who fixes your meals?: daughter  Who does your laundry?: daughter  Who picks up your prescriptions?: daughter  Dressing: Needs assistance  Grooming: Independent  Feeding: Independent  Bathing: Needs assistance  Toileting: Needs assistance  DME Currently at Home: [id number], Single [address], [place], [employer], Home O2, Portable O2 Tank, Hospital Bed, ADL- 3-in-1 Bedside Commode  Home Care/Community Services: Other (Comment)(Community Nurse Case  Production designer, theatre/television/film)  Adult Management consultant (APS) involved?: No  Discharge Planning  Support Systems: Children, Family members, Friends/neighbors  Patient expects to be discharged to:: home  Anticipated [place] plan discussed with:: Same as interviewed  Mode of transportation:: Private car (family member)  Does the patient have perscription coverage?: (P) Yes  Consults/Providers  PT Evaluation Needed: No  OT Evalulation Needed: No  SLP Evaluation Needed: No  Correct PCP listed in Epic?: Yes  Family and PCP  PCP on file was verified as the current PCP?: Yes   30 Day Readmission:       Provider Notifications:            05/06/19 0900   Discharge Planning   Expected Discharge Date 05/08/19   Does the patient have perscription coverage? Yes   Confirmed PCP with Pt Yes   Confirmed PCP name Dr. Amada Jupiter   Last PCP Visit Date 04/29/19   Confirm Transport to F/U Appt. Self/Private Vehicle/Friend   Anticipated Home Health at [place] No   Anticipated Placement at  No   CM Comments 1/5~Referral sent to [facility]. On home oxygen through [employer]. Hospital bed was delivered last week

## 2019-05-06 NOTE — UM Notes (Signed)
[facility] Utilization Management Review Sheet    Facility :  [facility]    NAME: Quinya Zaunbrecher  MR#: [id number]    CSN#: [id number]    ROOM: 217/217-A AGE: 79 y.o.    ADMIT DATE AND TIME: 05/04/2019  8:18 PM      PATIENT CLASS:  Observation  <>  05/05/2019 1:45 AM    ATTENDING PHYSICIAN: Graciela Husbands, DO  PAYOR:Payor: MEDICARE / Plan: MEDICARE PART A AND B / Product Type: Medicare /       AUTH #:     DIAGNOSIS:     ICD-10-CM    1. Acute systolic congestive heart failure  I50.21        HISTORY:   Past Medical History:   Diagnosis Date    Aortic aneurysm     Atrial fibrillation     Chronic obstructive pulmonary disease     Coronary artery disease     Gastroesophageal reflux disease     Hypertension     Malignant neoplasm     Pacemaker     SSS (sick sinus syndrome)        DATE OF REVIEW: 05/06/2019    VITALS: BP 107/67    Pulse 64    Temp 97.5 F (36.4 C) (Oral)    Resp 14    Ht 1.626 m (5\' 4" )    Wt 75.4 kg (166 lb 3.6 oz)    SpO2 94%    BMI 28.53 kg/m     Active Hospital Problems    Diagnosis    Diastolic CHF, acute on chronic    Venous stasis ulcer of right ankle with fat layer exposed without varicose veins    Chronic venous insufficiency    Pulmonary HTN    Chronic respiratory failure with hypoxia, on home O2 therapy    Chronic interstitial lung disease    Permanent atrial fibrillation       Chief Complaint   Patient presents with    Shortness of Breath     HPI   Presents the ED with shortness of breath.  Patient has been having increasing shortness of breath for a few weeks but it is been worse over the last couple of days she also has bilateral lower extremity swelling and some redness of both lower extremities.  She has a history of congestive heart failure and had plans to come in for an evaluation and likely hospitalization for further treatment of the congestive heart failure.  She does have a history of COPD and CAD along with A. fib and aortic disease.  She is not currently  having chest pain.  No fevers.      BP: (!) 145/91, Heart Rate: 74, Temp: 97.8 F (36.6 C), Resp Rate: (!) 24, SpO2: 98 %    Home oxygen 4L per [place].    Breath sounds: Examination of the right-lower field reveals decreased breath sounds and rales. Examination of the left-lower field reveals decreased breath sounds and rales. Decreased breath sounds and rales present        Labs<> Bnp 533.0.  Na 134. Co2 31. Bun 80. Crt 1.77.  egfr 27.  Trop 0.03.            XR Chest AP Portable (Final result)  Result time 05/04/19 21:17:48  Final result by Sheliah Plane, MD (05/04/19 21:17:48)                Impression:      Stable chest with cardiomegaly and  bibasilar atelectasis.     Stable aneurysmal thoracic aortic appearance.     Elevated left hemidiaphragm. No pneumothorax.     Stable left subclavian pacer. Diffuse osteopenia                ED tx 01/3<> ML. Lasix 60mg  IV.     ED tx 1/4<>Lasix 40mg  IV.    Home meds.       H&P<> Dr. Melanee Spry      In the emergency department, BNP was elevated at 533 (was 198.9 on 04/11/2019).  Creatinine somewhat above baseline at 1.77 (was 1.24 on 04/29/2019, increase could be cardiorenal).  Oxygen saturations in the high 90s, but patient was only able to speak in short sentences.  60 mg IV Lasix were administered in the emergency department, along with 1 hydrocodone  Records reviewed, including emergency department records and prior hospital records.  Patient was most recently admitted from 04/11/2019 through 04/13/2019, for an exacerbation of diastolic heart failure.  Echocardiogram on 01/28/2019 demonstrates ejection fraction of 55 to 60%, unable to assess diastolic function due to atrial fibrillation.  Severe biatrial enlargement, along with moderate to severe tricuspid regurgitation    Assessment and Plan:                                                                1.  Congestive heart failure, acute on chronic, diastolic.  Patient is placed on observation status, in the medical surgical  unit, on telemetry.  Lasix 40 mg IV twice daily.  Continue metolazone 2.5 mg daily, before the dose of Lasix.  Continue spironolactone 25 mg daily.  Patient has had a relatively recent echocardiogram.  Oxygen saturations are reasonable, but the patient is significantly short of breath.    2.  Atrial fibrillation, chronic persistent.  Rate control with atenolol 50 mg daily and diltiazem CD 240 mg daily.  Continue Coumadin (current dose is 2.5 mg daily, except 5 mg on Tuesdays).  Monitor INR    3.  History of gout.  Continue allopurinol 100 mg daily.  It appears patient is on chronic prednisone 10 mg daily.    4.  COPD.  Does not seem to be in exacerbation.  Xopenex nebulizers as necessary.  Continue Brio Ellipta 100/25 1 puff daily, and Spiriva.  Patient appears to be on chronic prednisone 10 mg daily.    5.  Hyperlipidemia.  Continue rosuvastatin 10 mg daily.    6.  Coronary artery disease.  Coumadin in place of aspirin.  Control of blood pressure and blood cholesterol as outlined above.    7.  Glaucoma.  Patient's chronic eyedrops will be continued.    8.  Chronic respiratory failure, on home oxygen, 4 L nasal cannula per notes of primary care physician    DVT PPx: Coumadin         Observation   Serial Trop. Pt/inr. Cbc. Bmp, Foley cath. Fluid restriction. Oxygen 4L per nc. Lasix 40mg  IV 2x day. Norco 5/325mg  1 tab po 3x day.         CSR<> 05/06/2019<> Dr Donivan Scull MCG<> OC-[id number]    Lowella Dell RN,CCM/UR    [facility]  [address]  [place] [address]    PH: [phone]  FX: [phone]

## 2019-05-06 NOTE — Progress Notes (Signed)
Is an 79 year old woman well-known to me who presented to the emergency room yesterday complaining of difficulty breathing associated with swelling in the legs.  She has chronic biventricular failure with an underlying chronic diastolic left ventricular dysfunction, permanent atrial fibrillation, and associated pulmonary hypertension with right ventricular failure.  As a result she has Class 3-4 New York Heart Association lifestyle, capable of only mild exertion on good days.  Normally she is in a reclining chair with her legs modestly elevated but always swollen.  As a result of the chronic swelling she has developed an ulceration in the medial aspect of the right leg which currently has surrounding cellulitis.  She complains of a lot of pain in the area with pain along the saphenous vein distribution in the right calf.    Yesterday she was admitted by Dr. Melanee Spry from [facility] whose help we always appreciate.  The patient has diuresed with intravenous Lasix and metolazone with improvement in her breathing but today she complains of leg cramps.  She was able to eat breakfast and lunch.    It appears today that her issues are the leg cramps and the cellulitis in the right leg.    Vitals:    05/06/19 0610   BP: 107/67   Pulse: 64   Resp: 14   Temp: 97.5 F (36.4 C)   SpO2: 94%     On exam she has a plethoric woman with facial telangiectasias and a ruddy complexion  Neck veins are distended  Heart S1-S2 rhythm irregular, rate controlled.  There is a right ventricular and left ventricular heave  Lungs with diminished breath sounds but clear bilaterally  Abdomen is obese soft and nontender.  I do not think there is ascites.  She has less edema in her legs but still +1 bilaterally.  There is a deep brown pigmentation to both legs consistent with chronic venous stasis.  She has a ulceration in the right leg distally which is 2.5 cm in diameter.  There is surrounding erythema and tenderness noted.  There is  tenderness along the saphenous vein distribution of that leg.    Results     Procedure Component Value Units Date/Time    Basic Metabolic Panel [[id number]]  (Abnormal) Collected: 05/06/19 0557    Specimen: Plasma Updated: 05/06/19 0643     Sodium 135 mMol/L      Potassium 3.6 mMol/L      Chloride 88 mMol/L      CO2 36.0 mMol/L      Calcium 10.4 mg/dL      Glucose 846 mg/dL      Creatinine 9.62 mg/dL      BUN 72 mg/dL      Anion Gap 95.2 mMol/L      BUN / Creatinine Ratio 58.5 Ratio      EGFR 42 mL/min/1.30m2      Osmolality Calculated 292 mOsm/kg     CBC and differential [[id number]]  (Abnormal) Collected: 05/06/19 0557    Specimen: Blood Updated: 05/06/19 0633     WBC 7.8 K/cmm      RBC 3.91 M/cmm      Hemoglobin 10.9 gm/dL      Hematocrit 02.7 %      MCV 91 fL      MCH 28 pg      MCHC 31 gm/dL      RDW 25.3 %      PLT CT 139 K/cmm      MPV 7.2 fL  Neutrophils % 74.5 %      Lymphocytes 14.0 %      Monocytes 10.7 %      Eosinophils % 0.0 %      Basophils % 0.8 %      Neutrophils Absolute 5.8 K/cmm      Lymphocytes Absolute 1.1 K/cmm      Monocytes Absolute 0.8 K/cmm      Eosinophils Absolute 0.0 K/cmm      Basophils Absolute 0.1 K/cmm     Prothrombin time/INR [[id number]]  (Abnormal) Collected: 05/06/19 0557    Specimen: Blood Updated: 05/06/19 0633     PT 27.9 sec      PT INR 2.7    Prothrombin time/INR [[id number]]  (Abnormal) Collected: 05/05/19 1505    Specimen: Blood Updated: 05/05/19 1525     PT 26.4 sec      PT INR 2.6    Prothrombin time/INR [[id number]]  (Abnormal) Collected: 05/04/19 2023    Specimen: Blood Updated: 05/05/19 1408     PT 22.2 sec      PT INR 2.1        Active Hospital Problems    Diagnosis   . Chronic kidney disease (CKD), active medical management without dialysis, stage 3 (moderate)   . Diastolic CHF, acute on chronic   . Venous stasis ulcer of right ankle with fat layer exposed without varicose veins   . Chronic venous insufficiency   . Pulmonary HTN   . Chronic respiratory failure with  hypoxia, on home O2 therapy   . Chronic interstitial lung disease   . Permanent atrial fibrillation     Plan: Admit patient to inpatient status.  Begin ceftriaxone 1 g every 12 hours.  Her pain is not well controlled and so I am starting OxyContin 10 mg every 12 hours with Percocet for breakthrough pain.  She has been started on something for her bowels and also a muscle relaxant for the muscle cramping caused by diuresis.  Lasix and metolazone have been held for a day but will be restarted in the near future.    Electronically signed by Graciela Husbands D.O.

## 2019-05-07 LAB — PT/INR
PT INR: 2.8 — ABNORMAL HIGH (ref 0.9–1.2)
PT: 28.7 s — ABNORMAL HIGH (ref 9.4–11.3)

## 2019-05-07 LAB — CBC AND DIFFERENTIAL
Basophils %: 0.9 % (ref 0.0–3.0)
Basophils Absolute: 0.1 10*3/uL (ref 0.0–0.3)
Eosinophils %: 0.1 % (ref 0.0–7.0)
Eosinophils Absolute: 0 10*3/uL (ref 0.0–0.8)
Hematocrit: 35.2 % — ABNORMAL LOW (ref 36.0–48.0)
Hemoglobin: 10.5 gm/dL — ABNORMAL LOW (ref 12.0–16.0)
Lymphocytes Absolute: 1 10*3/uL (ref 0.6–5.1)
Lymphocytes: 11.4 % — ABNORMAL LOW (ref 15.0–46.0)
MCH: 27 pg — ABNORMAL LOW (ref 28–35)
MCHC: 30 gm/dL — ABNORMAL LOW (ref 31–36)
MCV: 92 fL (ref 80–100)
MPV: 7.9 fL (ref 6.0–10.0)
Monocytes Absolute: 0.9 10*3/uL (ref 0.1–1.7)
Monocytes: 11.1 % (ref 3.0–15.0)
Neutrophils %: 76.6 % (ref 42.0–78.0)
Neutrophils Absolute: 6.5 10*3/uL (ref 1.7–8.6)
PLT CT: 133 10*3/uL (ref 130–440)
RBC: 3.84 10*6/uL (ref 3.80–5.00)
RDW: 14.8 % — ABNORMAL HIGH (ref 10.5–14.5)
WBC: 8.5 10*3/uL (ref 4.0–11.0)

## 2019-05-07 LAB — BASIC METABOLIC PANEL
Anion Gap: 13.1 mMol/L (ref 7.0–18.0)
BUN / Creatinine Ratio: 51.2 Ratio — ABNORMAL HIGH (ref 10.0–30.0)
BUN: 64 mg/dL — ABNORMAL HIGH (ref 7–22)
CO2: 36 mMol/L — ABNORMAL HIGH (ref 20.0–30.0)
Calcium: 10 mg/dL (ref 8.5–10.5)
Chloride: 88 mMol/L — ABNORMAL LOW (ref 98–110)
Creatinine: 1.25 mg/dL — ABNORMAL HIGH (ref 0.60–1.20)
EGFR: 41 mL/min/{1.73_m2} — ABNORMAL LOW (ref 60–150)
Glucose: 93 mg/dL (ref 71–99)
Osmolality Calculated: 284 mOsm/kg (ref 275–300)
Potassium: 4.1 mMol/L (ref 3.5–5.3)
Sodium: 133 mMol/L — ABNORMAL LOW (ref 136–147)

## 2019-05-07 MED ORDER — MAGNESIUM HYDROXIDE 400 MG/5ML PO SUSP
30.00 mL | Freq: Every day | ORAL | Status: DC | PRN
Start: 2019-05-07 — End: 2019-05-07

## 2019-05-07 MED ORDER — VH POTASSIUM CHLORIDE CRYS ER 10 MEQ PO TBCR (WRAP)
10.00 meq | EXTENDED_RELEASE_TABLET | Freq: Every day | ORAL | Status: DC
Start: 2019-05-08 — End: 2019-05-11
  Administered 2019-05-08 – 2019-05-11 (×4): 10 meq via ORAL
  Filled 2019-05-07 (×4): qty 1

## 2019-05-07 MED ORDER — MAGNESIUM HYDROXIDE 400 MG/5ML PO SUSP
60.00 mL | Freq: Every day | ORAL | Status: DC | PRN
Start: 2019-05-07 — End: 2019-05-11
  Administered 2019-05-07: 16:00:00 30 mL via ORAL
  Filled 2019-05-07: qty 60

## 2019-05-07 NOTE — Progress Notes (Signed)
Rikiyah is feeling much better following amplification of her analgesia.  Yesterday we switched her to OxyContin 10 mg every 12 hours and Percocet as needed for breakthrough pain.  She is feeling much better.  Her leg hurts much less.  In the meantime we are giving her IV antibiotics for cellulitis in the leg.  The cellulitis comes from a complicated venous stasis ulcer in the medial aspect of the right lower leg approximately 5 cm above the medial malleolus.     She is feeling quite good without shortness of breath or orthopnea.  We held her diuretics overnight when she started getting leg cramps.    Vitals:    05/07/19 0633   BP: 134/68   Pulse: 62   Resp: 19   Temp: 97.5 F (36.4 C)   SpO2: 96%     Lungs are clear bilaterally  Heart S1-S2 no murmurs  Abdomen soft and nontender  She has no peripheral edema.    Momentum of the diuretics from yesterday has led to a 3 L negative fluid balance since admission.  This seems to be enough.  She has chronic heart failure, predominantly on the right side from pulmonary hypertension caused by both intrinsic lung disease and left-sided diastolic heart failure.    Active Hospital Problems    Diagnosis   . Chronic kidney disease (CKD), active medical management without dialysis, stage 3 (moderate)   . Cellulitis of right ankle   . Diastolic CHF, acute on chronic   . Venous stasis ulcer of right ankle with fat layer exposed without varicose veins   . Chronic venous insufficiency   . Pulmonary HTN   . Chronic respiratory failure with hypoxia, on home O2 therapy   . Chronic interstitial lung disease   . Permanent atrial fibrillation     Plan: I would continue the antibiotics for now.  I will watch her every day and have a low threshold to restart the diuretics.  We will hold off for today again.    Electronically signed by Graciela Husbands D.O.

## 2019-05-07 NOTE — Progress Notes (Signed)
Patient reported that her legs feel much better today. Her current pain meds are helping. Patient also reported that she finally got some good sleep. No complaints or needs at this time. Will continue to monitor.

## 2019-05-08 LAB — PT/INR
PT INR: 3 — ABNORMAL HIGH (ref 0.9–1.2)
PT: 31.1 s — ABNORMAL HIGH (ref 9.4–11.3)

## 2019-05-08 MED ORDER — FUROSEMIDE 10 MG/ML IJ SOLN
40.00 mg | Freq: Every day | INTRAMUSCULAR | Status: DC
Start: 2019-05-08 — End: 2019-05-11
  Administered 2019-05-08 – 2019-05-11 (×4): 40 mg via INTRAVENOUS
  Filled 2019-05-08 (×4): qty 4

## 2019-05-08 NOTE — Plan of Care (Signed)
See PT eval for details.

## 2019-05-08 NOTE — PT Eval Note (Addendum)
[facility]  Department of Rehabilitation Services  [name] [facility]    CSN: [id number]    MEDICAL/SURGICAL   217/217-A    Physical Therapy Wound Evaluation    Time of treatment:  Time Calculation  PT Received On: 05/08/19  Start Time: 1320  Stop Time: 1350  Time Calculation (min): 30 min    Treatment#:   1                                                                                Precautions and Contraindications:   NONE    Assessment:      [name] was admitted 05/04/2019 with c/o SOB and leg swelling. Pt diagnosed with CHF exacerbation, cellulitis of her RLE and R distal LE venous stasis ulcer. PT was consulted for wound consult of 2 RLE wounds. Upon entry to pt's room, pt c/o 9/10 RLE pain. RN provided pain medication. Upon inspection, pt's RLE anterior ulcer/wound was odorous with yellow and black non-vital tissue which was debrided as much as pt could tolerate. The second wound, more posterior, appears to be a skin tear and is mostly red/healthy looking tissue. The entire peri-wound is dry but intact. At this time, PT will continue to follow these wounds to facilitate healing and wound closure.     Patient presenting with the following PT Impairments:open wound    Patient will benefit from skilled PT services in order to facilitate wound healing and closure.      Rehabilitation Potential:Fair due to chronicity of wounds and multiple medical co-morbidities;    Discussed risk, benefits and Plan of Care with: Patient    Goals:   To be met by time of discharge:  1. Decrease wound dimensions by >/= 10% to facilitate healing and wound closure.       Plan:   Treatment/interventions: sharp debridement, debridement >20 sq cm    Treatment Frequency: 1-3x/week     DISCHARGE RECOMMENDATIONS   DME recommended for Discharge:   None  Has needed equipment    Discharge Recommendations:   Home with Out-Patient PT for wound care follow-up    History of Present Illness      Medical Diagnosis: Acute systolic  congestive heart failure [I50.21]  Diastolic CHF, acute on chronic [I50.33]    [name] is a 79 y.o. female admitted on 05/04/2019 with c/o SOB and leg swelling. Pt diagnosed with CHF exacerbation, cellulitis of her RLE and R distal LE venous stasis ulcer.     Patient Active Problem List   Diagnosis   . CHF (congestive heart failure)   . Permanent atrial fibrillation   . Hyperkalemia   . Chronic diastolic CHF (congestive heart failure)   . On home oxygen therapy   . Aortic regurgitation   . Pacemaker   . Pacemaker at end of battery life   . Diaphragmatic eventration   . Chronic interstitial lung disease   . Thoracic aortic aneurysm without rupture   . Difficulty breathing   . Chronic respiratory failure with hypoxia, on home O2 therapy   . Nonobstructive atherosclerosis of coronary artery   . SSS (sick sinus syndrome)   . Moderate asthma with acute exacerbation   .  Pulmonary HTN   . Chronic venous insufficiency   . Bilateral lower extremity edema   . Diastolic CHF, acute on chronic   . Venous stasis ulcer of right ankle with fat layer exposed without varicose veins   . Chronic kidney disease (CKD), active medical management without dialysis, stage 3 (moderate)   . Cellulitis of right ankle        X-Rays/Tests/Labs:  Xr Chest Ap Portable    Result Date: 05/04/2019  Stable chest with cardiomegaly and bibasilar atelectasis. Stable aneurysmal thoracic aortic appearance. Elevated left hemidiaphragm. No pneumothorax. Stable left subclavian pacer. Diffuse osteopenia. ReadingStation:[id number]        Past Medical/Surgical History:  Past Medical History:   Diagnosis Date   . Aortic aneurysm    . Atrial fibrillation    . Chronic obstructive pulmonary disease    . Coronary artery disease    . Gastroesophageal reflux disease    . Hypertension    . Malignant neoplasm    . Pacemaker    . SSS (sick sinus syndrome)       Past Surgical History:   Procedure Laterality Date   . CARDIAC PACEMAKER PLACEMENT      2002   .  HYSTERECTOMY           Social History:   Home Living Arrangements:  Living Arrangements: Family members (dtr and son-in-law in basement)  Type of Home: House  Home Layout: Ramped entrance, Able to live on main level with bedroom and bathroom    DME available at home:  Assistive devices: Front wheeled Media planner: unknown    Home O2- 2L     Prior Level of Function:  Household ambulation with FWW, Independent     Subjective   "I'd give it a 12 but I'll say 9 so you have something to work with," pt stated in regards to her pain.    Patient is agreeable to participation in the therapy session.     Pain:  At Rest: 9 /10  Location: Leg:  right  Interventions: Medication (see eMAR), RN notified, pain medication given     Objective:   Patient's medical condition is appropriate for Physical therapy intervention at this time    Observation of patient  Patient is in bed with peripheral IV and Foley in place.    Vital Signs:  See flowsheet between start time and stop times for vitals taken during session     Skin Inspection: see below wounds      Cognition:  Follows 1 step commands without difficulty    Musculoskeletal Examination:     Range of motion:  Not tested this session       Strength  Not tested this session    Balance:   Balance:  Not tested this session         Functional Mobility:   Bed Mobility, Transfers, Locomotion:   Not Tested due to pt with IV running with need to keep arm elevated and extended on pillow; pain in RLE rated 9/10      Wound Assessment   Wound 1   Wound Location: RLE medial/anterior ulcer   Size (L x W x D) :1.2 x1.7 x0.6cm   Wound Etiology: venous   Pressure Ulcer Stage: N/A   See Photo: yes   Wound Drainage Description: serosanguinous   Wound Drainage Amount: minimal    Wound Base Description: yellow, black, spongy  Periwound Description: red/pink, dry   +odor  Wound 2   Wound Location: RLE medial/posterior skin tear    Size (L x W x D) :2.1 x 1.4 cm   Wound Etiology: trauma     Pressure Ulcer Stage: N/A   See Photo: yes   Wound Drainage Description: serosanguinous   Wound Drainage Amount: minimal    Wound Base Description: red  Periwound Description: red/pink, dry     Wound Treatment     Debridement: sharp tweezers   Tissue Removed: moderate, yellow necrosis, yellow slough from wound 1     Post-Treatment Dressing: Medi-honey, exu-dry, wrapped with guaze       Treatment Interventions:   Evaluation       Debridement of non-vital tissue to R medial/anterior ulcer    Education Provided:   TOPICS: role of physical therapy, plan of care.     Learner educated: Patient  Method: Explanation  Response to education: Verbalized understanding    Patient Position at End of Treatment:   Supine, in bed, Needs in reach, Bed/chair alarm set and Distress - RN/LPN notified    Team Communication:     Spoke to : RN/LPN - Delice Bison   Regarding: Severe pain RLE  Whiteboard updated: N/A      Alvino Chapel, DPT

## 2019-05-08 NOTE — Progress Notes (Signed)
She looks okay.  Wound therapy seems to think she could use some surgical debridement of the ulceration in the right leg.  Her appetite is good.  Her outlook is good.  She has no fever or evidence of sepsis.  She is doing well with the antibiotics.  She looks like she could use some of her diuretic today and I am going to give her 40 mg of Lasix daily.    She is having a double of the time with constipation.  I checked her for an impaction today and that was negative.  We will order a enema in the morning.  She was about to eat her dinner I did not want to interrupt that with an enema.    Vitals:    05/08/19 1325   BP: 133/57   Pulse: 77   Resp:    Temp:    SpO2: 99%     Lungs are clear  Heart S1-S2 no murmurs  Abdomen soft and nontender  She has a trace of peripheral edema.  There is deep dark pigmentation to her lower extremities from chronic venous insufficiency.    Results     Procedure Component Value Units Date/Time    Prothrombin time/INR [[id number]]  (Abnormal) Collected: 05/08/19 0610    Specimen: Blood Updated: 05/08/19 0633     PT 31.1 sec      PT INR 3.0        Plan: Tomorrow we will give her a fleets enema.  She had a negative digital rectal exam today for impaction.  I am going to restart her Lasix.  We will continue the antibiotics.  I will asked Dr. Zollie Beckers to lend an opinion about her wound and see if she does need some surgical debridement.    Electronically signed by Graciela Husbands D.O.

## 2019-05-09 LAB — PT/INR
PT INR: 2.7 — ABNORMAL HIGH (ref 0.9–1.2)
PT: 28 s — ABNORMAL HIGH (ref 9.4–11.3)

## 2019-05-09 MED ORDER — FLEET ENEMA 7-19 GM/118ML RE ENEM
1.00 | ENEMA | Freq: Once | RECTAL | Status: AC
Start: 2019-05-09 — End: 2019-05-09
  Administered 2019-05-09: 10:00:00 1 via RECTAL

## 2019-05-10 LAB — PT/INR
PT INR: 2.1 — ABNORMAL HIGH (ref 0.9–1.2)
PT: 22 s — ABNORMAL HIGH (ref 9.4–11.3)

## 2019-05-10 NOTE — Progress Notes (Signed)
Patient complained the bubbler on her O2 was making her nose dry. Nose was bleeding a little. She stated she had sores in her nose. Patient was asked when the sores started, she stated when the bubbler was put in place. Patient was almost in tears. She said she could not breath with the bubbler. She stated she could not eat because she could not breathe.  Nurse removed the bubbler. Patient is getting straight 3l O2.

## 2019-05-10 NOTE — Progress Notes (Signed)
[name] is stable.  Today she is worried about her hand shaking and being cold.  She sits in front of a fan blowing on her to help her breathe.  She likes her feet sticking out although they are cold she can put up with it.  She wants to know if there is any "balls to squeeze" to help with her hands that shake.  Appetite is fair.  Bowels opened up yesterday.  Her right leg continues to hurt quite a bit.  She has 2 wounds at the base of the right leg around the medial malleolus that are very tender to touch and are currently getting intravenous antibiotics.  Wound therapy seems to think they need debridement and so I have ordered a consult with Dr. Zollie Beckers, our general surgeon.    Vitals:    05/10/19 0929   BP:    Pulse: 97   Resp: 19   Temp:    SpO2: 98%     Lungs are clear  Cardiac rhythm irregular rate controlled S1-S2 no murmurs  Abdomen soft and nontender  Extremities Free of edema    Results     Procedure Component Value Units Date/Time    Prothrombin time/INR [[id number]]  (Abnormal) Collected: 05/10/19 0520    Specimen: Blood Updated: 05/10/19 0611     PT 22.0 sec      PT INR 2.1        Active Hospital Problems    Diagnosis   . Chronic kidney disease (CKD), active medical management without dialysis, stage 3 (moderate)   . Cellulitis of right ankle   . Diastolic CHF, acute on chronic   . Venous stasis ulcer of right ankle with fat layer exposed without varicose veins   . Chronic venous insufficiency   . Pulmonary HTN   . Drug-induced constipation   . Chronic respiratory failure with hypoxia, on home O2 therapy   . Chronic interstitial lung disease   . Permanent atrial fibrillation     Plan: Continue antibiotics and diuresis.  I gave her a warm blanket today which felt good.  I will update her labs in the morning.  I have asked Dr. Zollie Beckers to see her but he can wait till next week.    Electronically signed by Graciela Husbands D.O.

## 2019-05-11 ENCOUNTER — Inpatient Hospital Stay
Admission: RE | Admit: 2019-05-11 | Discharge: 2019-05-21 | DRG: 602 | Disposition: A | Discharge status: Home Health | Payer: Medicare Other | Source: Ambulatory Visit | Attending: Internal Medicine | Admitting: Internal Medicine

## 2019-05-11 DIAGNOSIS — I495 Sick sinus syndrome: Secondary | ICD-10-CM | POA: Diagnosis present

## 2019-05-11 DIAGNOSIS — Z950 Presence of cardiac pacemaker: Secondary | ICD-10-CM

## 2019-05-11 DIAGNOSIS — I872 Venous insufficiency (chronic) (peripheral): Secondary | ICD-10-CM | POA: Diagnosis present

## 2019-05-11 DIAGNOSIS — I4821 Permanent atrial fibrillation: Secondary | ICD-10-CM | POA: Diagnosis present

## 2019-05-11 DIAGNOSIS — R5381 Other malaise: Secondary | ICD-10-CM | POA: Diagnosis present

## 2019-05-11 DIAGNOSIS — E871 Hypo-osmolality and hyponatremia: Secondary | ICD-10-CM | POA: Diagnosis present

## 2019-05-11 DIAGNOSIS — I878 Other specified disorders of veins: Secondary | ICD-10-CM | POA: Diagnosis present

## 2019-05-11 DIAGNOSIS — L97312 Non-pressure chronic ulcer of right ankle with fat layer exposed: Secondary | ICD-10-CM | POA: Diagnosis present

## 2019-05-11 DIAGNOSIS — Z803 Family history of malignant neoplasm of breast: Secondary | ICD-10-CM

## 2019-05-11 DIAGNOSIS — N1831 Chronic kidney disease, stage 3a: Secondary | ICD-10-CM | POA: Diagnosis present

## 2019-05-11 DIAGNOSIS — I2510 Atherosclerotic heart disease of native coronary artery without angina pectoris: Secondary | ICD-10-CM | POA: Diagnosis present

## 2019-05-11 DIAGNOSIS — Z882 Allergy status to sulfonamides status: Secondary | ICD-10-CM

## 2019-05-11 DIAGNOSIS — I5033 Acute on chronic diastolic (congestive) heart failure: Secondary | ICD-10-CM | POA: Diagnosis present

## 2019-05-11 DIAGNOSIS — J449 Chronic obstructive pulmonary disease, unspecified: Secondary | ICD-10-CM | POA: Diagnosis present

## 2019-05-11 DIAGNOSIS — I5082 Biventricular heart failure: Secondary | ICD-10-CM | POA: Diagnosis present

## 2019-05-11 DIAGNOSIS — I2729 Other secondary pulmonary hypertension: Secondary | ICD-10-CM | POA: Diagnosis present

## 2019-05-11 DIAGNOSIS — L03115 Cellulitis of right lower limb: Principal | ICD-10-CM | POA: Diagnosis present

## 2019-05-11 DIAGNOSIS — Z90710 Acquired absence of both cervix and uterus: Secondary | ICD-10-CM

## 2019-05-11 DIAGNOSIS — Z91048 Other nonmedicinal substance allergy status: Secondary | ICD-10-CM

## 2019-05-11 DIAGNOSIS — R600 Localized edema: Secondary | ICD-10-CM | POA: Diagnosis present

## 2019-05-11 DIAGNOSIS — I130 Hypertensive heart and chronic kidney disease with heart failure and stage 1 through stage 4 chronic kidney disease, or unspecified chronic kidney disease: Secondary | ICD-10-CM | POA: Diagnosis present

## 2019-05-11 DIAGNOSIS — I83013 Varicose veins of right lower extremity with ulcer of ankle: Secondary | ICD-10-CM | POA: Diagnosis present

## 2019-05-11 DIAGNOSIS — Z885 Allergy status to narcotic agent status: Secondary | ICD-10-CM

## 2019-05-11 DIAGNOSIS — Z9981 Dependence on supplemental oxygen: Secondary | ICD-10-CM

## 2019-05-11 DIAGNOSIS — Z886 Allergy status to analgesic agent status: Secondary | ICD-10-CM

## 2019-05-11 DIAGNOSIS — I272 Pulmonary hypertension, unspecified: Secondary | ICD-10-CM | POA: Diagnosis present

## 2019-05-11 DIAGNOSIS — J9611 Chronic respiratory failure with hypoxia: Secondary | ICD-10-CM | POA: Diagnosis present

## 2019-05-11 DIAGNOSIS — K219 Gastro-esophageal reflux disease without esophagitis: Secondary | ICD-10-CM | POA: Diagnosis present

## 2019-05-11 DIAGNOSIS — N183 Chronic kidney disease, stage 3 unspecified: Secondary | ICD-10-CM | POA: Diagnosis present

## 2019-05-11 LAB — CBC AND DIFFERENTIAL
Basophils %: 1.1 % (ref 0.0–3.0)
Basophils Absolute: 0.1 10*3/uL (ref 0.0–0.3)
Eosinophils %: 0.3 % (ref 0.0–7.0)
Eosinophils Absolute: 0 10*3/uL (ref 0.0–0.8)
Hematocrit: 37 % (ref 36.0–48.0)
Hemoglobin: 11.2 gm/dL — ABNORMAL LOW (ref 12.0–16.0)
Lymphocytes Absolute: 0.9 10*3/uL (ref 0.6–5.1)
Lymphocytes: 10.2 % — ABNORMAL LOW (ref 15.0–46.0)
MCH: 27 pg — ABNORMAL LOW (ref 28–35)
MCHC: 30 gm/dL — ABNORMAL LOW (ref 31–36)
MCV: 90 fL (ref 80–100)
MPV: 7.1 fL (ref 6.0–10.0)
Monocytes Absolute: 0.9 10*3/uL (ref 0.1–1.7)
Monocytes: 10.9 % (ref 3.0–15.0)
Neutrophils %: 77.5 % (ref 42.0–78.0)
Neutrophils Absolute: 6.7 10*3/uL (ref 1.7–8.6)
PLT CT: 144 10*3/uL (ref 130–440)
RBC: 4.11 10*6/uL (ref 3.80–5.00)
RDW: 14.9 % — ABNORMAL HIGH (ref 10.5–14.5)
WBC: 8.6 10*3/uL (ref 4.0–11.0)

## 2019-05-11 LAB — BASIC METABOLIC PANEL
Anion Gap: 14 mMol/L (ref 7.0–18.0)
BUN / Creatinine Ratio: 38 Ratio — ABNORMAL HIGH (ref 10.0–30.0)
BUN: 30 mg/dL — ABNORMAL HIGH (ref 7–22)
CO2: 33 mMol/L — ABNORMAL HIGH (ref 20.0–30.0)
Calcium: 10.1 mg/dL (ref 8.5–10.5)
Chloride: 87 mMol/L — ABNORMAL LOW (ref 98–110)
Creatinine: 0.79 mg/dL (ref 0.60–1.20)
EGFR: 71 mL/min/{1.73_m2} (ref 60–150)
Glucose: 92 mg/dL (ref 71–99)
Osmolality Calculated: 267 mOsm/kg — ABNORMAL LOW (ref 275–300)
Potassium: 4 mMol/L (ref 3.5–5.3)
Sodium: 130 mMol/L — ABNORMAL LOW (ref 136–147)

## 2019-05-11 LAB — C-REACTIVE PROTEIN: C-Reactive Protein: 8.47 mg/dL — ABNORMAL HIGH (ref 0.02–0.80)

## 2019-05-11 LAB — PT/INR
PT INR: 2 — ABNORMAL HIGH (ref 0.9–1.2)
PT: 21.1 s — ABNORMAL HIGH (ref 9.4–11.3)

## 2019-05-11 MED ORDER — SODIUM CHLORIDE (PF) 0.9 % IJ SOLN
3.0000 mL | Freq: Three times a day (TID) | INTRAMUSCULAR | Status: DC
Start: 2019-05-11 — End: 2019-05-20
  Administered 2019-05-11 – 2019-05-19 (×22): 3 mL via INTRAVENOUS
  Filled 2019-05-11 (×10): qty 10

## 2019-05-11 MED ORDER — DOCUSATE SODIUM 100 MG PO CAPS
200.0000 mg | ORAL_CAPSULE | Freq: Every day | ORAL | Status: DC
Start: 2019-05-12 — End: 2019-05-21
  Administered 2019-05-12 – 2019-05-21 (×10): 200 mg via ORAL
  Filled 2019-05-11 (×10): qty 2

## 2019-05-11 MED ORDER — VH POTASSIUM CHLORIDE CRYS ER 10 MEQ PO TBCR (WRAP)
10.0000 meq | EXTENDED_RELEASE_TABLET | Freq: Every day | ORAL | Status: DC
Start: 2019-05-12 — End: 2019-05-21
  Administered 2019-05-12 – 2019-05-21 (×10): 10 meq via ORAL
  Filled 2019-05-11 (×10): qty 1

## 2019-05-11 MED ORDER — VH DILTIAZEM HCL ER BEADS 120 MG PO CP24 (WRAP)
240.0000 mg | ORAL_CAPSULE | Freq: Two times a day (BID) | ORAL | Status: DC
Start: 2019-05-11 — End: 2019-05-21
  Administered 2019-05-11 – 2019-05-21 (×20): 240 mg via ORAL
  Filled 2019-05-11 (×20): qty 2

## 2019-05-11 MED ORDER — CEFTRIAXONE SODIUM 1 G IJ SOLR
1.0000 g | Freq: Two times a day (BID) | INTRAMUSCULAR | Status: DC
Start: 2019-05-11 — End: 2019-05-16
  Administered 2019-05-11 – 2019-05-16 (×11): 1 g via INTRAVENOUS
  Filled 2019-05-11 (×10): qty 1000

## 2019-05-11 MED ORDER — PREDNISONE 5 MG PO TABS
5.0000 mg | ORAL_TABLET | Freq: Every day | ORAL | Status: DC
Start: 2019-05-12 — End: 2019-05-17
  Administered 2019-05-12 – 2019-05-17 (×6): 5 mg via ORAL
  Filled 2019-05-11 (×6): qty 1

## 2019-05-11 MED ORDER — MELATONIN 3 MG PO TABS
3.0000 mg | ORAL_TABLET | Freq: Every evening | ORAL | Status: DC | PRN
Start: 2019-05-11 — End: 2019-05-21
  Administered 2019-05-11 – 2019-05-19 (×3): 3 mg via ORAL
  Filled 2019-05-11 (×3): qty 1

## 2019-05-11 MED ORDER — FLUOROMETHOLONE 0.1 % OP SUSP
1.0000 [drp] | Freq: Every day | OPHTHALMIC | Status: DC
Start: 2019-05-11 — End: 2019-05-21
  Administered 2019-05-11 – 2019-05-20 (×10): 1 [drp] via OPHTHALMIC

## 2019-05-11 MED ORDER — NALOXONE HCL 0.4 MG/ML IJ SOLN (WRAP)
0.4000 mg | INTRAMUSCULAR | Status: DC | PRN
Start: 2019-05-11 — End: 2019-05-21

## 2019-05-11 MED ORDER — FLUTICASONE FUROATE-VILANTEROL 100-25 MCG/INH IN AEPB
1.0000 | INHALATION_SPRAY | Freq: Every morning | RESPIRATORY_TRACT | Status: DC
Start: 2019-05-12 — End: 2019-05-21
  Administered 2019-05-12 – 2019-05-21 (×9): 1 via RESPIRATORY_TRACT

## 2019-05-11 MED ORDER — ACETAMINOPHEN 325 MG PO TABS
650.0000 mg | ORAL_TABLET | ORAL | Status: DC | PRN
Start: 2019-05-11 — End: 2019-05-21

## 2019-05-11 MED ORDER — VH WARFARIN THERAPY PLACEHOLDER
1.0000 | Status: DC
Start: 2019-05-11 — End: 2019-05-16

## 2019-05-11 MED ORDER — WARFARIN SODIUM 2.5 MG PO TABS
2.5000 mg | ORAL_TABLET | ORAL | Status: DC
Start: 2019-05-11 — End: 2019-05-16
  Administered 2019-05-11 – 2019-05-15 (×4): 2.5 mg via ORAL
  Filled 2019-05-11 (×4): qty 1

## 2019-05-11 MED ORDER — HYDROXYZINE HCL 10 MG PO TABS
10.0000 mg | ORAL_TABLET | Freq: Four times a day (QID) | ORAL | Status: DC | PRN
Start: 2019-05-11 — End: 2019-05-21
  Administered 2019-05-14 – 2019-05-21 (×7): 10 mg via ORAL
  Filled 2019-05-11 (×7): qty 1

## 2019-05-11 MED ORDER — TIOTROPIUM BROMIDE MONOHYDRATE 2.5 MCG/ACT IN AERS
2.0000 | INHALATION_SPRAY | Freq: Every morning | RESPIRATORY_TRACT | Status: DC
Start: 2019-05-12 — End: 2019-05-21
  Administered 2019-05-12 – 2019-05-21 (×9): 2 via RESPIRATORY_TRACT
  Filled 2019-05-11: qty 1

## 2019-05-11 MED ORDER — OXYCODONE HCL ER 10 MG PO T12A
10.0000 mg | EXTENDED_RELEASE_TABLET | Freq: Two times a day (BID) | ORAL | Status: DC
Start: 2019-05-11 — End: 2019-05-21
  Administered 2019-05-11 – 2019-05-21 (×20): 10 mg via ORAL
  Filled 2019-05-11 (×20): qty 1

## 2019-05-11 MED ORDER — MAGNESIUM HYDROXIDE 400 MG/5ML PO SUSP
60.0000 mL | Freq: Every day | ORAL | Status: DC | PRN
Start: 2019-05-11 — End: 2019-05-21

## 2019-05-11 MED ORDER — FUROSEMIDE 10 MG/ML IJ SOLN
40.0000 mg | Freq: Every day | INTRAMUSCULAR | Status: DC
Start: 2019-05-12 — End: 2019-05-16
  Administered 2019-05-12 – 2019-05-16 (×5): 40 mg via INTRAVENOUS
  Filled 2019-05-11 (×5): qty 4

## 2019-05-11 MED ORDER — POLYETHYLENE GLYCOL 3350 17 G PO PACK
17.0000 g | PACK | Freq: Every day | ORAL | Status: DC
Start: 2019-05-12 — End: 2019-05-21
  Administered 2019-05-12 – 2019-05-21 (×9): 17 g via ORAL
  Filled 2019-05-11 (×10): qty 1

## 2019-05-11 MED ORDER — OXYCODONE-ACETAMINOPHEN 5-325 MG PO TABS
1.0000 | ORAL_TABLET | ORAL | Status: DC | PRN
Start: 2019-05-11 — End: 2019-05-21
  Administered 2019-05-11 – 2019-05-20 (×13): 1 via ORAL
  Filled 2019-05-11 (×13): qty 1

## 2019-05-11 MED ORDER — LATANOPROST 0.005 % OP SOLN
1.0000 [drp] | Freq: Every evening | OPHTHALMIC | Status: DC
Start: 2019-05-11 — End: 2019-05-21
  Administered 2019-05-11 – 2019-05-20 (×10): 1 [drp] via OPHTHALMIC

## 2019-05-11 MED ORDER — ALLOPURINOL 100 MG PO TABS
100.0000 mg | ORAL_TABLET | Freq: Every day | ORAL | Status: DC
Start: 2019-05-12 — End: 2019-05-21
  Administered 2019-05-12 – 2019-05-21 (×10): 100 mg via ORAL
  Filled 2019-05-11 (×10): qty 1

## 2019-05-11 MED ORDER — CYCLOBENZAPRINE HCL 10 MG PO TABS
5.0000 mg | ORAL_TABLET | Freq: Three times a day (TID) | ORAL | Status: DC | PRN
Start: 2019-05-11 — End: 2019-05-21
  Filled 2019-05-11: qty 1

## 2019-05-11 MED ORDER — WARFARIN SODIUM 5 MG PO TABS
5.0000 mg | ORAL_TABLET | ORAL | Status: DC
Start: 2019-05-13 — End: 2019-05-16
  Administered 2019-05-13: 17:00:00 5 mg via ORAL
  Filled 2019-05-11: qty 1

## 2019-05-11 MED ORDER — ALBUTEROL SULFATE (2.5 MG/3ML) 0.083% IN NEBU
2.5000 mg | INHALATION_SOLUTION | Freq: Four times a day (QID) | RESPIRATORY_TRACT | Status: DC | PRN
Start: 2019-05-11 — End: 2019-05-21
  Administered 2019-05-13 – 2019-05-17 (×3): 2.5 mg via RESPIRATORY_TRACT
  Filled 2019-05-11 (×3): qty 3

## 2019-05-11 MED ORDER — VH BIO-K PLUS PROBIOTIC 50 BIL CFU CAPSULE
50.0000 | DELAYED_RELEASE_CAPSULE | Freq: Every day | ORAL | Status: DC
Start: 2019-05-12 — End: 2019-05-21
  Administered 2019-05-12 – 2019-05-21 (×10): 50 via ORAL
  Filled 2019-05-11 (×10): qty 1

## 2019-05-11 NOTE — Discharge Readmit Summary (Addendum)
[facility] Standard Discharge/READMIT Summary   [facility]  [facility]   Patient Name: [name]   Attending Physician: Graciela Husbands, DO PCP: Graciela Husbands, DO   Date of Admission: 05/04/2019 D/C Date: 05/11/2019   Discharge Diagnoses:     Principal Problem:    Diastolic CHF, acute on chronic  Active Problems:    Permanent atrial fibrillation    Chronic interstitial lung disease    Chronic respiratory failure with hypoxia, on home O2 therapy    Drug-induced constipation    Pulmonary HTN    Chronic venous insufficiency    Venous stasis ulcer of right ankle with fat layer exposed without varicose veins    Chronic kidney disease (CKD), active medical management without dialysis, stage 3 (moderate)    Cellulitis of right ankle    Hyponatremia  Resolved Problems:    * No resolved hospital problems. *      Pending Results and other Significant studies:      None    Hospital Course:      [name] is a 79 y.o. female patient that was admitted on 05/04/2019 she is an 79 year old woman well-known to me who presented to the emergency room on 1/4 with difficulty breathing associated with severe swelling in her legs.  This was despite maximum medical therapy.  She has chronic biventricular failure with underlying chronic diastolic left ventricular dysfunction, permanent atrial fibrillation with moderately severe pulmonary hypertension with resultant chronic right ventricular failure.  As a result mild exertion at home creates symptoms.  She generally has some component of edema when outside the hospital.  We are able to rid her of edema in the hospital but it soon reaccumulates when she goes home.  She tries her best to control her fluid intake and her sodium intake but her social structure at home, despite the efforts of a daughter who lives with her is difficult at best.  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  These are currently a source of cellulitis and she is getting intravenous antibiotics.  This morning I note that her C-reactive protein is jumped over 8 mg/dL.  This is a new finding.  Her white count is normal.  She has no fever.  She has a lot of pain and tenderness in the area.  I have asked general surgery to consult and they will see her later this week.  She probably needs debridement.  This is based on the wound evaluation done by our physical therapy department.  She is slow to move and get around.  She spends most of her day in a reclining chair with a fan blowing on her on oxygen at 3 to 4 L/min.  She also has asthmatic airway disease which response to bronchodilators.  That has not been an issue since she has been here.  We diuresed her earlier this week.  We also had trouble with constipation.  She is currently on Lasix 40 mg daily.  She does have an indwelling Foley.  He is moving to transitional care did today for further antibiotic therapy and for the surgical evaluation tomorrow or the next day.  I will also have PT/OT see here to see if we can get some modest improvement.  I spoke to the patient and her daughter  today.  I even discussed hospice to see if that is an option when she goes home.    Discharge Instructions:          Special discharge instructions:      Recommended Follow-up after eventual discharge from new facility:  No follow-up provider specified.    Orders (Signed & Held) for release in the new facility:                                                                     Discharge/READMIT     No order context     ID Description Signed By When Reason    [id number] acetaminophen (TYLENOL) tablet 650 mg-Every 4 hours PRN Graciela Husbands, DO 05/11/19 1315 Physician Will Release    [id number] cefTRIAXone (ROCEPHIN) 1 g in sodium chloride 0.9 % 100 mL IVPB mini-bag plus-Every 12 hours Graciela Husbands, DO 05/11/19 1315 Physician Will Release    [id number] allopurinol (ZYLOPRIM) tablet 100 Phineas Douglas, DO 05/11/19 1315 Physician Will Release    [id number] cyclobenzaprine (FLEXERIL) tablet 5 mg-3 times daily PRN Graciela Husbands, DO 05/11/19 1315 Physician Will Release    [id number] dilTIAZem (CARDIZEM CD) 24 hr capsule 240 mg-2 times daily Graciela Husbands, DO 05/11/19 1315 Physician Will Release    [id number] docusate sodium (COLACE) capsule 200 Phineas Douglas, DO 05/11/19 1315 Physician Will Release    [id number] fluorometholone (FML) 0.1 % ophthalmic suspension 1 drop-daily at 2100 Graciela Husbands, DO 05/11/19 1315 Physician Will Release    [id number] fluticasone furoate-vilanterol (BREO ELLIPTA) 100-25 MCG/INH 1 puff-RT - Every morning Graciela Husbands, DO 05/11/19 1315 Physician Will Release    [id number] furosemide (LASIX) injection 40 mg-Daily Graciela Husbands, DO 05/11/19 1315 Physician Will Release    [id number] hydrOXYzine (ATARAX) tablet 10 mg-Every 6 hours PRN Graciela Husbands, DO 05/11/19 1315 Physician Will Release    [id number] lactobacillus species (BIO-K PLUS) capsule 50 Billion CFU-Daily Graciela Husbands, DO 05/11/19 1315 Physician Will Release    [id number] latanoprost (XALATAN) 0.005 % ophthalmic solution 1 drop-At bedtime Graciela Husbands, DO 05/11/19 1315 Physician Will Release    [id number] albuterol (PROVENTIL) (2.5 MG/3ML) 0.083% nebulizer solution 2.5 mg-RT - Every 6 hours as needed Graciela Husbands, DO 05/11/19 1315 Physician Will Release    [id number] magnesium hydroxide (MILK OF MAGNESIA) 400 MG/5ML suspension 60 mL-Daily PRN Graciela Husbands, DO 05/11/19 1315 Physician Will Release    [id number] melatonin tablet 3 mg-At bedtime PRN Graciela Husbands, DO 05/11/19 1315 Physician Will Release    [id number] naloxone [employer]) injection 0.4 mg-As needed Graciela Husbands, DO 05/11/19 1315 Physician Will Release    [id number] oxyCODONE (OxyCONTIN) 12 hr tablet 10 mg-Every 12 hours scheduled Graciela Husbands, DO 05/11/19 1315 Physician Will Release     [id number] oxyCODONE-acetaminophen (PERCOCET) 5-325 MG per tablet 1 tablet-Every 4 hours PRN Graciela Husbands, DO 05/11/19 1315 Physician Will Release    [id number] polyethylene glycol [facility]) packet 17 g-Daily Graciela Husbands, DO 05/11/19 1315 Physician Will Release    [id number] potassium chloride (K-DUR,KLOR-CON) CR tablet 10 Ivor Messier, DO 05/11/19 1315 Physician Will Release    [id number] predniSONE (DELTASONE) tablet 5 mg-Daily Graciela Husbands, DO 05/11/19 1315 Physician Will Release    [id number] sodium chloride (PF) 0.9 % injection 3 mL-Every 8 hours  Graciela Husbands, DO 05/11/19 1315 Physician Will Release    [id number] tiotropium (SPIRIVA RESPIMAT) 2.5 MCG/ACT inhalation spray 2 puff-RT - Every morning Graciela Husbands, DO 05/11/19 1315 Physician Will Release    [id number] warfarin (COUMADIN) tablet 2.5 mg-User specified Graciela Husbands, DO 05/11/19 1315 Physician Will Release    [id number] warfarin (COUMADIN) tablet 5 mg-Weekly on Tuesday evening Graciela Husbands, DO 05/11/19 1315 Physician Will Release    [id number] warfarin therapy placeholder (COUMADIN therapy placeholder) 1 each-See admin instructions Graciela Husbands, DO 05/11/19 1315 Physician Will Release    [id number] Ambulate patient-Every shift Graciela Husbands, DO 05/11/19 1315 Physician Will Release    [id number] BLEEDING PRECAUTIONS-Continuous Graciela Husbands, DO 05/11/19 1315 Physician Will Release    [id number] Call physician for signs and symptoms of ischemia or bleeding regardless of INR-Until discontinued Graciela Husbands, DO 05/11/19 1315 Physician Will Release    [id number] Contact physician at discharge if INR is NOT at Target INR Range and physician has not addressed this at discharge-Until discontinued Graciela Husbands, DO 05/11/19 1315 Physician Will Release    [id number] Contact physician if INR is greater than 5.0-Until discontinued Graciela Husbands, DO 05/11/19 1315 Physician Will Release    [id number] Contact physician to stop parenteral agent if INR is greater than 2 for 2 consecutive  days (prechecked)-Until discontinued Graciela Husbands, DO 05/11/19 1315 Physician Will Release    [id number] Contact primary attending physician on morning rounds-Until discontinued Graciela Husbands, DO 05/11/19 1315 Physician Will Release    [id number] Diet cardiac Na restriction, if any: 2 GM NA-Diet effective now Graciela Husbands, DO 05/11/19 1315 Physician Will Release    [id number] Fall precautions-Continuous Graciela Husbands, DO 05/11/19 1315 Physician Will Release    [id number] Fluid restriction-Until discontinued Graciela Husbands, DO 05/11/19 1315 Physician Will Release    [id number] Follow Urinary Catheter Protocol-2 times daily Graciela Husbands, DO 05/11/19 1315 Physician Will Release    [id number] Follow Urinary Catheter Protocol-2 times daily Graciela Husbands, DO 05/11/19 1315 Physician Will Release    [id number] Inpatient consult to general surgery-Once Graciela Husbands, DO 05/11/19 1315 Physician Will Release    [id number] Mechanical VTE: Pneumatic Compression; Knee high-Every shift Graciela Husbands, DO 05/11/19 1315 Physician Will Release    [id number] Mobility Protocol-Until discontinued Graciela Husbands, DO 05/11/19 1315 Physician Will Release    [id number] Notify physician-Until discontinued Graciela Husbands, DO 05/11/19 1315 Physician Will Release    [id number] Nursing communication-Until discontinued Graciela Husbands, DO 05/11/19 1315 Physician Will Release    [id number] Oral care-Until discontinued Graciela Husbands, DO 05/11/19 1315 Physician Will Release    [id number] Oxygen-Adult Low Flow Nasal Cannula (6lpm or less) Target SpO2: 92%-Continuous Graciela Husbands, DO 05/11/19 1315 Physician Will Release    [id number] Pain Assessment-Every shift Graciela Husbands, DO 05/11/19 1315 Physician Will Release    [id number] Prothrombin time/INR-Morning Draw Daily (Limited) Graciela Husbands, DO 05/11/19 1315 Physician Will Release    [id number] PT evaluate and treat-Once Graciela Husbands, DO 05/11/19 1315 Physician Will Release    [id number] RD initiate protocol if applicable-Until discontinued Graciela Husbands, DO 05/11/19 1315 Physician Will Release    [id number] saline lock IV-Once Graciela Husbands, DO 05/11/19 1315 Physician Will Release    [id number] Skin assessment-Daily Graciela Husbands, DO 05/11/19 1315 Physician Will Release    [id number] Vital signs-Every 8 hours Graciela Husbands, DO 05/11/19 1315 Physician Will Release    [id number] Wound Care Instructions-As needed Graciela Husbands, DO 05/11/19 1315 Physician Will Release    [id number] NO CPR - Support OK-Continuous Graciela Husbands, DO 05/11/19  1315 Physician Will Release                Discharge Day Exam (05/11/2019):     Blood pressure 144/73, pulse 82, temperature 97.5 F (36.4 C), temperature source Oral, resp. rate 20, height 1.626 m (5\' 4" ), weight 75.4 kg (166 lb 3.6 oz), SpO2 96 %.        Graciela Husbands, DO         05/11/19 1:25 PM   MRN: [id number]                                      CSN: [id number] DOB: [birth year]

## 2019-05-11 NOTE — Plan of Care (Signed)
Patient has been transferred to Transitional Care from Acute Care.Patient sitting upright in chair. Call bell and personal belongings within reach, chair legs locked, chair posey alarm on for safety. Will continue to monitor.

## 2019-05-11 NOTE — H&P (Signed)
An 79 year old woman moving to transitional care for further intravenous antibiotics, nursing care, physical therapy, occupational therapy, respiratory therapy, and a general surgery evaluation for potential debridement of venous stasis ulcerations of the right leg.    She is chronically ill with a very poor prognosis.  She has chronic heart disease which essentially is left-sided diastolic failure, pulmonary hypertension, and right ventricular failure associated with asthmatic airway disease and need for continuous high flow oxygen.  She has atrial fibrillation and a pacemaker and is chronically anticoagulated with warfarin.    She presented here swollen and short of breath which is atypical presentation for her.  She has been diuresed.  We give her as needed short acting beta agonists.  We were using lev albuterol during the acute care stay but I am never able to order that without pharmacies help so she is now on albuterol.  She is not receiving many of these nebulizers anyway.  She does have a hand tremor and was on Sinemet at one time.  She does not have Parkinson's disease however and that was discontinued.    Since she has been here she has had a lot of pain in her right leg and is requiring OxyContin and Percocet for breakthrough pain.  The poor thing has been miserable.    Active Ambulatory Problems     Diagnosis Date Noted   . CHF (congestive heart failure) 11/05/2013   . Permanent atrial fibrillation 11/06/2013   . Hyperkalemia 08/24/2014   . Chronic diastolic CHF (congestive heart failure) 08/25/2014   . On home oxygen therapy 08/25/2014   . Aortic regurgitation 08/25/2014   . Pacemaker 08/25/2014   . Pacemaker at end of battery life 03/22/2015   . 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 Chronic interstitial lung disease 07/19/2015   . Thoracic aortic aneurysm without rupture 07/19/2015   . Difficulty breathing 04/25/2017   . Chronic respiratory failure with hypoxia, on home O2 therapy 04/26/2017   .  Nonobstructive atherosclerosis of coronary artery 04/23/2018   . SSS (sick sinus syndrome) 04/25/2018   . Drug-induced constipation 01/27/2019   . Moderate asthma with acute exacerbation 01/28/2019   . Pulmonary HTN 01/30/2019   . Chronic venous insufficiency 02/13/2019   . Bilateral lower extremity edema 02/13/2019   . Diastolic CHF, acute on chronic 05/05/2019   . Venous stasis ulcer of right ankle with fat layer exposed without varicose veins 05/05/2019   . Chronic kidney disease (CKD), active medical management without dialysis, stage 3 (moderate) 05/06/2019   . Cellulitis of right ankle 05/06/2019   . Hyponatremia 05/11/2019     Resolved Ambulatory Problems     Diagnosis Date Noted   . Urinary tract infection with hematuria, site unspecified    . Acute exacerbation of CHF (congestive heart failure) 07/16/2015   . Nausea 01/27/2019   . Headache 01/27/2019   . Acute gout of right foot 03/01/2019     Past Medical History:   Diagnosis Date   . Aortic aneurysm    . Atrial fibrillation    . Chronic obstructive pulmonary disease    . Coronary artery disease    . Gastroesophageal reflux disease    . Hypertension    . Malignant neoplasm      Review of Systems   Constitutional: Positive for malaise/fatigue and weight loss. Negative for chills and fever.   HENT: Positive for hearing loss.    Eyes: Negative.    Respiratory: Positive for cough, shortness of breath and  wheezing.    Cardiovascular: Positive for orthopnea and leg swelling. Negative for chest pain.   Gastrointestinal: Positive for constipation.        The constipation was corrected earlier this week with milk of magnesia.  She also needed an enema.  She did not have a fecal impaction.   Genitourinary: Negative.    Musculoskeletal: Positive for back pain, joint pain and myalgias. Negative for falls.   Skin:        She has chronic dark pigmentation of the lower extremities with venous stasis.  She also has 2 ulcerations around the medial malleolus of the right  ankle.   Neurological: Positive for tremors and weakness. Negative for dizziness, focal weakness, seizures, loss of consciousness and headaches.   Endo/Heme/Allergies: Bruises/bleeds easily.   Psychiatric/Behavioral: Positive for depression. Negative for memory loss. The patient is nervous/anxious. The patient does not have insomnia.        On exam she is a chronically ill woman with plethoric cheeks.  She also has facial telangiectasias.  There are telangiectasias noted in the chest wall.  She has a lot of ecchymoses on her arms  Heart S1-S2 rhythm irregular grade 1 systolic murmur noted.  No gallop  Lungs with diminished breath sounds but clear bilaterally without wheezes or rales  Abdomen is soft and nontender  She has deeply pigmented lower extremities with 2-10 mm ulcerations noted that are rather deep in the skin around the right medial malleolus.  There are indicative of venous stasis ulcers.    Active Hospital Problems    Diagnosis   . Hyponatremia   . Cellulitis of right ankle   . Chronic kidney disease (CKD), active medical management without dialysis, stage 3 (moderate)   . Diastolic CHF, acute on chronic   . Venous stasis ulcer of right ankle with fat layer exposed without varicose veins   . Bilateral lower extremity edema   . Pulmonary HTN   . On home oxygen therapy   . Pacemaker   . Permanent atrial fibrillation     Plan: The patient is moving to transitional care for antibiotics etc. as I mentioned above.  Surgery will see her.  We are trying to get a home plan together which I think will include hospice at the time of discharge.    Electronically signed by Graciela Husbands D.O.

## 2019-05-12 ENCOUNTER — Telehealth (RURAL_HEALTH_CENTER): Payer: Self-pay | Admitting: Internal Medicine

## 2019-05-12 LAB — PT/INR
PT INR: 2 — ABNORMAL HIGH (ref 0.9–1.2)
PT: 21.1 s — ABNORMAL HIGH (ref 9.4–11.3)

## 2019-05-12 NOTE — OT Eval Note (Signed)
[facility]  Department of Rehabilitation Services  [phone]    [facility]    Occupational Therapy Transitional Care Evaluation    Emillie [facility]    CSN#: [id number]  MEDICAL/SURGICAL 217/217-A    Time of treatment:   Time Calculation  OT Received On: 05/12/19  Start Time: 1047  Stop Time: 1118  Time Calculation (min): 31 min  Total Treatment Time (min): 28(less time assisting pt with comfort needs at end of eval)    Precautions and Contraindications:   Falls  Skin & Pressure Ulcer Precautions  Bleeding Precautions   (+) foley (not her baseline)    Medical and Therapy History:   Medical Diagnosis: Acute systolic (congestive) heart failure [I50.21]  Acute on chronic diastolic (congestive) heart failure [I50.33]  Diastolic CHF, acute on chronic [I50.33]    Therapy Diagnosis:  Weakness, decreased activity tolerance, decreased (I) with ADLs, decreased (I) with functional transfers/mobility    [name] is a 79 y.o. female admitted on 05/11/2019 with further IV antibiotic, nursing and rehabilitative needs due to CHF exacerbation complicated by RLE ulceration with cellulitis requiring surgical consultation for potential debridement, per review of H&P. Pt with past medical history significant for aortic aneurysm, a-fib on chronic anticoagulation and with pacemaker, COPD on chronic supplemental O2 (2-3L/min via nasal cannula), CAD, GERD, HTN, chronic venous insufficiency, CKD-III and recent difficulties with cellulitis of R-ankle and hyponatremia.     Patient Active Problem List   Diagnosis   . CHF (congestive heart failure)   . Permanent atrial fibrillation   . Hyperkalemia   . Chronic diastolic CHF (congestive heart failure)   . On home oxygen therapy   . Aortic regurgitation   . Pacemaker   . Pacemaker at end of battery life   . Diaphragmatic eventration   . Chronic interstitial lung disease   . Thoracic aortic aneurysm without rupture   . Difficulty breathing   . Chronic  respiratory failure with hypoxia, on home O2 therapy   . Nonobstructive atherosclerosis of coronary artery   . SSS (sick sinus syndrome)   . Drug-induced constipation   . Moderate asthma with acute exacerbation   . Pulmonary HTN   . Chronic venous insufficiency   . Bilateral lower extremity edema   . Diastolic CHF, acute on chronic   . Venous stasis ulcer of right ankle with fat layer exposed without varicose veins   . Chronic kidney disease (CKD), active medical management without dialysis, stage 3 (moderate)   . Cellulitis of right ankle   . Hyponatremia      X-Rays/Tests/Labs:  No results found.     Discussed with patient/family/caregiver the patient's physical, cognitive and/or psychosocial history related to current functional performance: yes    Previous therapy services: Inpatient rehab for PT/OT in March 2017 related to acute exacerbation of CHF, PT eval in December 2018 related to CHF, OT/PT screens in 2020 but no therapy needs warranted     Past Medical/Surgical History:  Past Medical History:   Diagnosis Date   . Aortic aneurysm    . Atrial fibrillation    . Chronic obstructive pulmonary disease    . Coronary artery disease    . Gastroesophageal reflux disease    . Hypertension    . Malignant neoplasm    . Pacemaker    . SSS (sick sinus syndrome)       Past Surgical History:   Procedure Laterality Date   . CARDIAC PACEMAKER PLACEMENT  2002   . HYSTERECTOMY         Occupational Profile:   Home Living Arrangements:  Living Arrangements: Pt reports that her daughter and son-in-law reside with her in the basement  Prior Level of Supervision:-Overnight supervision = caregiver physically present overnight for safety and/or physical assistance  Pt reports that daughter works during the day and her son-in-law does not provide physical assistance  Assistance Available: Part time, daughter and son-in-law reportedly assists with IADLs prn    Type of Home: House  Home Layout: One level, Ramped entrance, Able to  live on main level with bedroom and bathroom, Basement, that adult children reside in but pt does not access basement  Bathroom:  raised toilet, can push up from adjacent sink;  tub/shower unit, grab bars in shower, hand-held shower, but pt has been completing sponge-bathing 1x/week over recent weeks due to fear of increasing weakness and risk of fall    Prior Level of Function:  Mobility: Household ambulation  Mobility:  Modified independence  with  Front wheeled walker  Fall History: Denies recent falls within past 6 months    Activities of Daily Living:  Patient was independent with all ADLs "as best as I'm able to do"  [place]hes ~1x/weekly. Does not wear socks, dons slip-on slippers only    DME available at home:  Front Wheeled Walker     Adult  Rollator (4 wheeled walker)  Single point cane  Hospital Bed  Oxygen - home    Instrumental Activities of Daily Living  Patient was dependent for all IADLs at baseline.    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   "You're going to need to help me" pt stated during sit>stand transitional movement. Pt able to complete with CGA with verbal cueing and encouragement to complete.     Patient is agreeable to participation in the therapy session.     Patient/caregiver goal(s)/priorities and concerns for OT: Improve walking, return home by week's end    Assessment of Occupational Performance:   Observation of patient  Patient is seated in a bedside chair with dressings, Bed/chair alarm on, peripheral IV, O2 at 3 liters/minute via nasal cannula, indwelling urinary catheter in place.    Pain:  At Rest: 8/10  With Activity: 8/10  Location: Leg:  right and distal  Interventions: Medication (see eMAR),  Repositioned, Rest, elevated and floated with pillow prop at end of evaluation    Vital Signs(Cardiovascular):  Stable with no signs/symptoms of distress  See flowsheet between start time and stop times for vitals taken during session     Edema/Skin inspection:  Edema:  Unable to assess due to dressing  No upper extremity edema noted    Skin inspection:  Discoloration of skin secondary to vascular insufficiency    Posture:  Forward Head:  seated and standing, Rounded Shoulders:  seated and standing, increased Thoracic Kyphosis    Musculoskeletal Examination:   Range of motion:  Bilateral UE: WFL except SH-flex limited ~50% of full range with compensatory movement in scaption       Strength:  Right UE: Grossly 3/5  Left UE: Grossly 3/5    Tone:  Not  applicable    Activities of Daily Living:   Eating:  Supervision/Set up; , seated in a chair, reportedely has difficulty eating due to tremulous UEs-may benefit from weighted utensils;   Grooming: Supervision/Set up, Simulated, based on AROM screen  UB dressing:  Supervision/Set up, Simulated, based on AROM screen  LB dressing:  Total assist: unable to perform task due to current impairments, don/doff slippers  Bathing:  Moderate assist;, Simulated, (S) for UB but max-A with LB  Toileting:  Total assist: unable to perform task due to current impairments;, (+) foley, anticipate assistance with hygiene    Functional Mobility:  Bed Mobility:  Not tested due to patient already OOB.    Transfers:  Sit to Stand:  CGA>SBA with Front wheeled walker, Gait belt.        Stand to Sit:  CGA.   Comments: pt aligned self properly in front of sitting surface and observed with good safety reaching max to sitting surface  Functional mobility/ambulation: CGA>SBA with Front wheeled walker, Gait belt.  Assistance for management of O2 tubing     Neurology:   Cognition:   Oriented to: Oriented x4  Command following: Follows 1 step commands with repetition  Alertness/Arousal: Appropriate  responses to stimuli   Attention Span:Attends to task with redirection  Memory: Appears intact  Safety Awareness: minimal verbal instruction  Insights: Educated in safety awareness  Problem Solving: Assistance required to generate solutions, Assistance required to implement solutions, moderate assistance    Behavior:  Cooperative    Auditory/Oculomotor Examination:  Auditory:  impaired:  bilaterally, hearing aide  bilaterally and but states that they are not currently working properly and are at home  Visual Acuity:  wears glasses, bifocals         Motor Planning/Coordination:  Hand Dominance:  Right handed  Motor Planning:  Bradykinesia  Coordination:  Within Normal Limits (WNL)         Sensation:     Patient reporting No Numbness or Paresthesia of UEs  Sensation is not tested this session    Balance:   Static Sitting:  Good  Static Standing:  Fair  Dynamic Standing:  Fair        Participation and Activity Tolerance   Participation effort: Good  Activity Tolerance: Tolerates 30 minutes of activity with rest breaks    Treatment Interventions this session:   OT EVAL    Education Provided:   TOPICS: role of occupational therapy, plan of care, goals of therapy and safety with mobility and ADLs, benefits of activity, activity with nursing, normal recovery process in regards to activity and ADLs after discharge from hospital     Learner educated: Patient  Method: Explanation  Response to education: Verbalized understanding    Team Communication:   OT communicated with: RN/LPN Einar Pheasant  OT communicated regarding: Pre-session re: patient status, pain levels  Whiteboard updated: Yes  OT/COTA communication: via written note and verbal communication as needed.    Sitting, in a chair, in the room, Needs in reach, Bed/chair alarm set, No distress, Affected Extremity elevated and Heels floating      OT ASSESSMENT/CLINICAL DECISION MAKING:   [name] is a 79 y.o. female admitted 05/11/2019 with further IV antibiotic,  nursing and rehabilitative needs due to CHF exacerbation complicated by RLE ulceration with cellulitis requiring surgical consultation for potential debridement, per review of H&P. Prior to hospitalization, pt states that she continued to reside at home where her daughter and son-in-law reside in her basement.  Pt led a rather sedentary lifestyle, admitting to spending the majority of the day in her recliner chair to keep her LEs elevated. She states she was independent with ADLs ("as best as I'm able to do"), dependent for IADLs and completed limited household mobility with a FWW. She denies a recent fall history.     This date, pt required total-A with LB ADLs. She participated in transitional movements and functional mobility with FWW and CGA-SBA to ensure max-safety. She was observed with decreased stance time on RLE which consequently impaired foot clearance/safety stepping with LLE with concern for pt being at increased risk for fall. Although audibly dyspneic, SpO2 remained within an appropriate range with activity and mobility. At this time, pt is not at functional baseline and to benefit from short rehabilitative stay in order to maximize pt's functional (I) and safety with ADLs, light IADLs and household mobility prior to discharge to ensure safe and successful transition from hospital to home environment.     Patient presenting with the following OT impairments:  decreased ROM, decreased strength, balance deficits, decreased independence with ADLs, decreased independence with IADLs (item retrieval), decreased activity tolerance, decrease safety awareness, decreased attention, decreased problem solving, decreased functional mobility, decreased functional transfers    Patient will benefit from skilled OT services  in order to maximize pt's functional (I) and safety with ADLs, light IADLs and household mobility prior to discharge to ensure safe and successful transition from hospital to home environment.      Upon review of the occupational profile and client history, the patient's history was found to be BRIEF.  During assessment of the occupational performance, the patient demonstrated total of 5 or more performance deficits with minimal to moderate modificationsof task or assistance during assessment.  The patient has several treatment options in order to improve patient's level of function.  After review of the above, patient's degree of complexity is LOW.    Rehabilitation Potential: Fair (given pt's age, decreased participation in ADLs and dependency in IADLs prior to admission, multiple co-morbidities) With continued OT s/p transitional care discharge With family     Discussed risks and benefits and Plan of Care with: Patient    Goals:   STGs: Within 1 Week:  1. Pt will participate in bathing/dressing ADLs with supervision with AE/AT prn in order to demonstrate progression toward necessary level of functional (I) required to safely return home to (I)-living. = NEW  2. Pt will participate in item retrieval with supervision with LRAD in order to demonstrate progression toward improved safety and (I) with household mobility to ensure safe and successful transition to home environment. = NEW  3. Pt will identify > than or = to 75% of mock hazards during home safety assessment in order to ensure pt with appropriate level of safety awareness necessary for (I)-living. = NEW  4. Pt will participate in BUE HEP with supervision in order to increase strength, coordination and activity tolerance necessary for improved participation in ADLs and transfers/mobility. = NEW    LTGs: Within 2 Weeks:  1. Pt will be mod-(I) with ADLs, light IADLs (e.g., item retrieval, simple meal prep), bathroom transfers and household mobility with DME/AE/AT prn in order to maximize pt's functional independence and safety prior to discharge to ensure safe and successful transition to independent living. =NEW  2. Pt/family will demonstrate good  understanding of discharge needs and recommendations (e.g., DME, AE, AT, safety techniques, fall prevention, recommendation for HHOT prn, etc) to ensure safe and successful  transition from hospital to home environment. =NEW      Development of Plan of Care:   Treatment/interventions: ADL retraining, functional transfer training, UE strengthening/ROM, activity pacing, cognition (safety awareness, problem solving, etc.), patient/family training, equipment eval/education, neuro muscular re-education, compensatory technique education    Treatment Frequency: 3-5 days/week for 1-2 weeks    DISCHARGE RECOMMENDATIONS   DME recommended for Discharge:   TBD closer to discharge  Long handled sponge   Tub transfer bench  Dressing stick    Discharge Recommendations:   To be determined closer to discharge  Home  Home Health  -Off-site supervision = patient has medical alert and/or caregiver calls daily to check on patient; caregiver available to assist if needed.      Osker Mason, MS, OTR/L

## 2019-05-12 NOTE — PT Eval Note (Addendum)
[facility]  Department of Rehabilitation Services  [phone]    [facility]    Physical Therapy Transitional Care Evaluation    [name] [facility]    CSN: [id number]    MEDICAL/SURGICAL   217/217-A    Time of treatment:  Time Calculation  PT Received On: 05/12/19  Start Time: 1137  Stop Time: 1203  Time Calculation (min): 26 min                                                                                Precautions and Contraindications:   Foley catheter     History of Present Illness:   Medical Diagnosis: Acute systolic (congestive) heart failure [I50.21]  Acute on chronic diastolic (congestive) heart failure [I50.33]  Diastolic CHF, acute on chronic [I50.33]    Therapy Diagnosis:  Gait difficulty, Muscle weakness     [name] is a 79 y.o. female admitted on 05/11/2019 with need for continued IV antibiotics, wound care and rehab following hospitalization for CHF exacerbation and RLE cellulitis.     Patient Active Problem List   Diagnosis   . CHF (congestive heart failure)   . Permanent atrial fibrillation   . Hyperkalemia   . Chronic diastolic CHF (congestive heart failure)   . On home oxygen therapy   . Aortic regurgitation   . Pacemaker   . Pacemaker at end of battery life   . Diaphragmatic eventration   . Chronic interstitial lung disease   . Thoracic aortic aneurysm without rupture   . Difficulty breathing   . Chronic respiratory failure with hypoxia, on home O2 therapy   . Nonobstructive atherosclerosis of coronary artery   . SSS (sick sinus syndrome)   . Drug-induced constipation   . Moderate asthma with acute exacerbation   . Pulmonary HTN   . Chronic venous insufficiency   . Bilateral lower extremity edema   . Diastolic CHF, acute on chronic   . Venous stasis ulcer of right ankle with fat layer exposed without varicose veins   . Chronic kidney disease (CKD), active medical management without dialysis, stage 3 (moderate)   . Cellulitis of right ankle   . Hyponatremia         X-Rays/Tests/Labs:  No results found.      Past Medical/Surgical History:  Past Medical History:   Diagnosis Date   . Aortic aneurysm    . Atrial fibrillation    . Chronic obstructive pulmonary disease    . Coronary artery disease    . Gastroesophageal reflux disease    . Hypertension    . Malignant neoplasm    . Pacemaker    . SSS (sick sinus syndrome)       Past Surgical History:   Procedure Laterality Date   . CARDIAC PACEMAKER PLACEMENT      2002   . HYSTERECTOMY           Social History:   Home Living Arrangements:  Living Arrangements: Family members (dtr and son-in-law in basement)  Type of Home: House  Home Layout: Ramped entrance, Able to live on main level with bedroom and bathroom    DME available at home:  Assistive devices:  Front wheeled walker  Adaptive equipment: unknown    Home O2- 2L     Prior Level of Function:  Household ambulation with FWW, Independent     Subjective:   "I just got settled here in my chair, do I have to get up again?"  With encouragement, patient is agreeable to participation in the therapy session.       Examination of Body Systems (Structures, Function, Activity and Participation):   Observation of patient  Patient is seated in a bedside chair with Bed/chair alarm on, O2 at 3 liters/minute via nasal cannula in place.    Pain:  At Rest: 8/10 on VAS   Location: Leg:  right  Interventions: Medication (see eMAR), Repositioned    Vital Signs(Cardiovascular):  See flowsheet between start time and stop times for vitals taken during session     Edema/Skin inspection:  Edema:  No pedal edema noted         Skin inspection:  Wound re-dressed this morning, therefore dressing was not removed again at this time. Below are measurements from in-patient evaluation on 05/08/19:    Wound 1              Wound Location: RLE medial/anterior ulcer              Size (L x W x D) :1.2 x1.7 x0.6cm              Wound Etiology: venous              Pressure Ulcer Stage: N/A              See Photo: yes               Wound Drainage Description: serosanguinous              Wound Drainage Amount: minimal    Wound Base Description: yellow, black, spongy  Periwound Description: red/pink, dry   +odor      Wound 2              Wound Location: RLE medial/posterior skin tear               Size (L x W x D) :2.1 x 1.4 cm              Wound Etiology: trauma               Pressure Ulcer Stage: N/A              See Photo: yes              Wound Drainage Description: serosanguinous              Wound Drainage Amount: minimal    Wound Base Description: red  Periwound Description: red/pink, dry     Posture:  Forward Head:  seated and standing, Rounded Shoulders:  seated    Musculoskeletal Examination:   Range of motion:  BLEs grossly WFLs AROM         Strength:  Not tested this session- WFL for transfers/amb       Functional Mobility:  Bed Mobility:  Not tested due to patient already OOB.    Transfers:  Sit to Stand:  MOD I with Front wheeled walker  Stand to Sit:  Modified independence      Locomotion:  LEVEL AMBULATION:  Distance: 45'   Assistance level:  SBA for max safety  Device:  Front wheeled walker  Pattern:  Step  to    Neurology:   Cognition:   Command following: Follows 1 step commands without difficulty     Behavior:  Cooperative    Motor Planning/Coordination:  Motor Planning:  Intact  Coordination:  Within Normal Limits (WNL)      Sensation:  Patient reporting Numbness of  B feet  Sensation is diminished, to light touch BLE    Balance:   Static Sitting:  Good  Dynamic Sitting:  Good  Static Standing:  Good  Dynamic Standing:  Fair                   Participation and Activity Tolerance:   Participation effort: Good  Activity Tolerance: Limited by pain in RLE    Treatment Interventions this session:   Evaluation    Education Provided:   TOPICS: role of physical therapy, plan of care, goals of therapy and benefits of activity    Learner educated: Patient  Method: Explanation  Response to education: Verbalized  understanding    Team Communication:   PT communicated with: interdisciplinary team   PT communicated regarding: Discharge needs, Patient participation with Therapy  Whiteboard updated: Yes    PT/PTA communication: via written note and verbal communication as needed.    Sitting, in a chair, Needs in reach, Bed/chair alarm set and No apparent distress      Clinical Presentation and Decision Making:   PT Assessment:  [name] was admitted 05/11/2019 with need for continued IV antibiotics, wound care and rehab following hospitalization for CHF exacerbation and RLE cellulitis. I anticipate a brief rehab stay as pt appears close to her functional baseline. Her gait was slow but steady with no overt LOB noted.  Dr. Amada Jupiter has consulted another physician regarding distal anterior wound for possible surgical debridement/intervention; will stay in close collaboration regarding appropriateness of continued PT interventions to facilitate wound healing/closure.     Patient presenting with the following PT Impairments: decreased functional mobility, decreased balance, gait deficits    Patient will benefit from skilled PT services in order to increase (I) with all functional mobility to facilitate a safe return home/return to PLOF.      Due to the presence of multiple treatment options and several comorbidities or personal factors that affect performance, as well as patient's stable and/or uncomplicated characteristics, modifications were NOT necessary to complete evaluation when examining total of 4 or more elements (includes body structures and functions, activity limitations and/or participation restrictions) determines the degree of complexity for this patient is LOW        Rehabilitation Potential:  Good    Discussed risk, benefits and Plan of Care with: Patient    Goals:   STGs=LTGs as short stay of 1 week is anticipated:    1. Decrease wound dimensions by >/= 10% to facilitate healing and wound closure.   2. Pt  will perform supine <> sit with MOD I for increased (I) with bed mobility; NEW  3. Pt will amb >100' using FWW with MOD I for increased (I) with household mobility; NEW      Development of Plan of Care:   Treatment/interventions: Exercise, Gait training, Neuromuscular re-education, Functional transfer training, LE strengthening/ROM, Patient/caregiver training, Bed mobility; sharp debridement, debridement > 20 sq cm     Treatment Frequency:  3-5 days/week for 1 week         DISCHARGE RECOMMENDATIONS   DME recommended for Discharge:   None  Has needed equipment  Discharge Recommendations:   Pending progress towards goals  possibly home health PT      Alvino Chapel, PT

## 2019-05-12 NOTE — Progress Notes (Signed)
Readmission Risk  [facility] - MEDICAL/SURGICAL   Patient Name: [name]   Attending Physician: Graciela Husbands, DO   Today's date:   05/12/2019 LOS: 1 days   Expected Discharge Date Expected Discharge Date: (P) 05/16/19    Readmission Assessment:                                                              Discharge Planning  Expected Discharge Date: (P) 05/16/19  ReAdmit Risk Score: 27  Does the patient have perscription coverage?: (P) Yes  Confirmed PCP with Pt: (P) Yes  Confirmed PCP name: (P) Dr. Amada Jupiter  Last PCP Visit Date: (P) 04/29/19  Confirm Transport to F/U Appt.: (P) Self/Private Vehicle/Friend  Anticipated Home Health at [place] [facility] [place]: (P) Yes  Name of Home Health Agency Placement: [facility]) [facility] - [facility]  Referral made for home health RN visit?: (P) Yes  Anticipated Placement at [place]: (P) No  CM Comments: (P) 1/11~Agreeable to [facility] SN/PT/OT at time of d/c. Has home oxygen with [employer]       IDPA:   Patient Type  Within 30 Days of Previous Admission?: Yes(04/11/19-04/13/19 [facility])  Healthcare Decisions  Interviewed:: Patient  Orientation/Decision Making Abilities of Patient: Alert and Oriented x3, able to make decisions  Prior to admission  Prior level of function: Independent with ADLs  Type of Residence: Private residence  Home Layout: One level  Have running water, electricity, heat, etc?: Yes  Living Arrangements: Spouse/significant other, Family members  How do you get to your MD appointments?: daughter or son in law  How do you get your groceries?: daughter  Who fixes your meals?: daughter  Who does your laundry?: daughter  Who picks up your prescriptions?: daughter  DME Currently at Home: Gilmer Mor, [employer], Environmental consultant, [employer], ADL- 3-in-1 Bedside Commode, Hospital Bed, Home O2, Portable O2 Tank  Adult Management consultant (APS) involved?: No  Discharge Planning  Support Systems: Children, Family members  Patient expects to be discharged to:: home  Anticipated [place] plan  discussed with:: Same as interviewed  Mode of transportation:: Private car (family member)  Does the patient have perscription coverage?: (P) Yes  Consults/Providers  Correct PCP listed in Epic?: Yes  Family and PCP  PCP on file was verified as the current PCP?: Yes   30 Day Readmission:       Provider Notifications:            05/12/19 0945   Discharge Planning   Expected Discharge Date 05/16/19   Does the patient have perscription coverage? Yes   Confirmed PCP with Pt Yes   Confirmed PCP name Dr. Amada Jupiter   Last PCP Visit Date 04/29/19   Confirm Transport to F/U Appt. Self/Private Vehicle/Friend   Anticipated Home Health at [place] Yes   Name of Home Health Agency Placement [facility]   Referral made for home health RN visit? Yes   Anticipated Placement at [place] No   CM Comments 1/11~Agreeable to [facility] SN/PT/OT at time of d/c. Has home oxygen with [employer]

## 2019-05-12 NOTE — Telephone Encounter (Signed)
Patient's daughter called, Paulette Blanch.  She would like to speak with you regarding her mothers status. Please call her back at [phone].

## 2019-05-12 NOTE — Patient Care Conference (Signed)
[name]    INTERDISCIPLINARY CARE PLAN   TEAM CONFERENCE NOTE    Patient: [name]  Admission Date: 05/11/2019    Date of Team Conference: May 13, 2019      TEAM MEMBERS PRESENT     Dr. Graciela Husbands  Margie Billet, RN/CM  Lang Snow, DPT  Gilberto Better, MSOTR/L  Verlon Au, RT  Oliver Hum, RD  Fredricka Bonine, RPh  Beth Mallalieu, BSN  Will [place], RN   Amparo Bristol, RN   Tiffany Rubbie Battiest, CNA  Reatha Harps, RN  Denna Haggard, CNA    MEDICAL     Extensive discussion re: patient's ongoing medical management issues, as well as medical and rehabilitative functional progress, plan of care, and discharge planning.    Active Problems:   Active Hospital Problems    Diagnosis    Hyponatremia    Cellulitis of right ankle    Chronic kidney disease (CKD), active medical management without dialysis, stage 3 (moderate)    Diastolic CHF, acute on chronic    Venous stasis ulcer of right ankle with fat layer exposed without varicose veins    Bilateral lower extremity edema    Pulmonary HTN    On home oxygen therapy    Pacemaker    Permanent atrial fibrillation     Isolation: None    Medications Scheduled:   Current Facility-Administered Medications   Medication Dose Route Frequency    allopurinol  100 mg Oral Daily    cefTRIAXone  1 g Intravenous Q12H    dilTIAZem  240 mg Oral BID    docusate sodium  200 mg Oral Daily    fluorometholone  1 drop Left Eye daily at 2100    fluticasone furoate-vilanterol  1 puff Inhalation QAM    furosemide  40 mg Intravenous Daily    lactobacillus species  50 Billion CFU Oral Daily    latanoprost  1 drop Both Eyes QHS    oxyCODONE  10 mg Oral Q12H SCH    polyethylene glycol  17 g Oral Daily    potassium chloride  10 mEq Oral Daily    predniSONE  5 mg Oral Daily    sodium chloride (PF)  3 mL Intravenous Q8H    tiotropium  2 puff Inhalation QAM    warfarin  2.5 mg Oral Once per day on Sun Mon Wed Thu Fri Sat     [START ON 05/13/2019] warfarin  5 mg Oral Q Tue (PM)    warfarin therapy placeholder  1 each Does not apply See Admin Instructions     Medications PRN: acetaminophen, albuterol, cyclobenzaprine, hydrOXYzine, magnesium hydroxide, melatonin, naloxone, oxyCODONE-acetaminophen    Medication changes (changes from previous week): See [facility]    NURSING      Pain:    Pain controlled with medication    Over the last week, the patient has rated pain 0/10 at the minimum and 7/10 at the maximum.  Over the last 24 hours, the patients maximum pain rating has been 7/10.      Patient was encouraged to notify staff if pain becomes unbearable. Patient was educated on pain relieving techniques including: Pharmacological Interventions (see MAR).  The Patient was encouraged to notify staff as needed to assist with managing pain.  Nursing will continue to assess pain and effectiveness of non-pharmacological and pharmacological interventions.       DVT prophylaxis:    Coumadin  Bowel/Bladder:  BLADDER FUNCTION  Foley  BOWEL FUNCTION  Independent with bowel program        Skin:    Braden Scale  Sensory Perceptions: No impairment  Moisture: Rarely moist  Activity: Walks occasionally  Mobility: Slightly limited  Nutrition: Adequate  Friction and Shear: Potential problem  Braden Scale Score: 19    Braden Skin Interventions  Moisture level Interventions: Offer hourly toileting opportunities  Activity/Mobility Interventions: Offered assistance with transfers/ambulation to increase activity  Nutrition Interventions: Allowed adequate time for meals  Friction and Shear Interventions: Draw sheet placed from patient's shoulders to hips to reposition    Venous stasis ulcer to RLE and cellulitis       Patient/Family Education Needs:  Anticoagulation teaching has been completed Patient is on IVF medication - refer to medical records for details              Nursing Long Term Goals -    Patient will remain free of falls during  hospitalization  Mobility/activity is maintained at optimal level for the patient  Damaged tissue will be healing and is protected     Nursing Interventions:  Anticoagulation, Blowel/Bladder Management, IV Medication(s), Oral Medication(s), Pain Management, PMP:  mobility/ambulation and Wound Care    Is the patient making progress towards nursing goals?  Progressing    Comments/Issues:      The above nursing section completed by:  R. Dareen Piano, RN      PHYSICAL THERAPY     Physical Therapy is ordered for this patient     Please refer to PT evaluation completed yesterday, 05/12/19 for details.     The above physical therapy section completed by:  Lang Snow, DPT    OCCUPATIONAL THERAPY     Occupational Therapy is ordered for this patient         Eating:  Supervision/Set up; seated in chair     Grooming (general):   Supervision/Set up, seated in chair (washing face and brushing hair)     UB Drg:  Supervision/Set up, seated in chair, able to take off and rearrange gown.      LB Drg:   Total assist: unable to perform task due to current impairments and fatigue, able to don/doff slippers     Transfers:  Supine to sitting EOB: CGA  Sit to Stand:  CGA>SBA with FWW and gait belt   Stand to Sit:  CGA  Chair: Pt able to ambulate to chair with CGA using FWW and gait belt. Assistance for management of 02 tubing     Bathing:  Pt refused UB/LB bathing at this time. Pt set up assist for UB bathing able to put lotion on arms and torso. Pt refused LB bathing at this time previous results from eval show max assist      Toileting:  Total assist: unable to perform task due to current impairments      Cognition:    Oriented to: Oriented x4  Command following: Follows 1 step commands with repetition  Alertness/Arousal: Appropriate responses to stimuli   Attention Span:Attends to task with redirection  Memory: Appears intact  Safety Awareness: minimal verbal instruction  Insights: Educated in safety awareness  Problem Solving: Assistance  required to generate solutions, Assistance required to implement solutions, moderate assistance     Long Term Goals:    1. Pt will be mod-(I) with ADLs, light IADLs (e.g., item retrieval, simple meal prep), bathroom transfers and household mobility with DME/AE/AT prn in order to maximize pt's  functional independence and safety prior to discharge to ensure safe and successful transition to independent living. =ONGOING  2. Pt/family will demonstrate good understanding of discharge needs and recommendations (e.g., DME, AE, AT, safety techniques, fall prevention, recommendation for HHOT prn, etc) to ensure safe and successful transition from hospital to home environment. =ONGOING        Occupational Therapy for ADL retraining, functional transfer training, UE strengthening/ROM, activity pacing, cognitive reorientation, patient/family training, equipment eval/education, neuro muscular re-education, compensatory technique education      Is the patient making progress towards occupational therapy goals?  Progressing    Comments/Issues:  Pt required maximal encouragement to participate in today's therapy session. Pt able to perform supine to sitting EOB with CGA. Pt able to sit in chair and complete grooming activity for a ~10 minute duration. Pt would continue to benefit from skilled OT treatment to address pain, ROM, activity tolerance, and strength to increase safety and ADL independence.     The above occupational therapy section completed by:  Gwyneth Sprout OTR/L      SPEECH LANGUAGE PATHOLOGY     Speech/ Language Therapy is not ordered for this patient.       NUTRITION THERAPY     The patient did trigger for a Nutrition Screen. Geriatric/CHF/CKD/SOB    Orders Placed This Encounter   Procedures    Diet cardiac Na restriction, if any: 2 GM NA     This patient did not have a Significant Weight Change:       Wt Readings from Last 1 Encounters:   05/12/19 0655 74.4 kg (164 lb 0.4 oz)     The patients meal intake is  adequate at >50% for estimated needs               Comments/Issues: Easy to chew foods to alleviate SOB  Nursing will continue to monitor weights  Will continue to monitor intakes  Patient to continue with diet plan.      The above nutrition therapy section completed by:  Oliver Hum, MS RD    RESPIRATORY THERAPY     Respiratory Therapy is ordered for this patient.     Patient, prior to admission, was on oxygen at 2-3 L/min via nasal cannula.     Patient was not using a Positive Airway Pressure system prior to admission.      Respiratory Therapy Orders: Albuterol nebulizer 2.5 mg Q6H PRN, Breo Ellipta, Spiriva     Comments/Issues:      The above respiratory therapy section completed by: D Klonowski RRT-ACCS, AE-C     ACTIVITIES PLAN     Is the patient participating in activities?  See [facility] (Transitional Care) activities Flowsheet for detailed information of weekly participation in leisure actvities     The above activities plan section completed by:  Gwyneth Sprout, OTR/L    CASE MANAGEMENT / SOCIAL SERVICE / DISCHARGE PLAN       Discharge Plan Update:  Home with family/other      DME to order:   TBD closer to discharge        Anticipated Discharge Services:    Home Health - for specific Home Health agency assignment, please see CM/SW note.     A Patient/Family conference is not needed.      Plan communicated to: Dyane Dustman - Case management to discuss discharge disposition with patient and family (with patient's permission).    Anticipated date of discharge: pending IV antibiotic, therapy need  Scheduled Follow-up appointments: TBD at time of discharge    The above case management/social service/discharge plan section completed by: Harvel Quale RN BSN     Continued Skilled Care Involves the Following   (Section to be completed at the end of the team meeting)     Changes to the current plan of care:   no changes are needed to current plan of care    BARRIERS TO REHABILITATIVE  GOALS  BARRIERS TO DISCHARGE  No significant barrier to discharge    Patient continues to demonstrate reasonable potential for functional and/ or medical improvement from intensive and comprehensive skilled services under the direction of attending physician.      Continue with the recommended plan of care as above per IDT members in attendance.

## 2019-05-12 NOTE — Plan of Care (Signed)
Please refer to OT eval for details pertaining to functional status, goals and POC.

## 2019-05-12 NOTE — Plan of Care (Signed)
PT goals to be met by 05/19/19:  1. Decrease wound dimensions by >/= 10% to facilitate healing and wound closure;  2. Pt will perform supine <> sit with MOD I for increased (I) with bed mobility;   3. Pt will amb >100' using FWW with MOD I for increased (I) with household mobility;

## 2019-05-12 NOTE — Progress Notes (Addendum)
Patient is an 79 year old female admitted to transitional care for CHF exacerbation. Upon entering room, patient is noted to be resting in recliner chair with nursing students helping her. She is noted to be alert and oriented x4 during our conversation. Per patient, she lives at home with her daughter and son in law. They live in a 1 story home with a ramped entrance. Prior to hospitalization, patient was able to do her own sponge bath. She states that her son in law takes care of the house work, but her daughter does the laundry, shopping and cooking. Her son in law or a friend provide transportation to her appointments. She is noted to have a SPC, FWW, BSC, hospital bed and home as well as portable oxygen at home. Patient states that she gets her oxygen through [employer]. She confirms that she has MCR and FEP BCBS. Dr. Amada Jupiter is her PCP and she last saw him on 04/29/19. Patient uses [facility] for her prescription needs. CM did confirm with her that she has still been using [facility] services and she states that they are still calling her weekly except for the weeks of the holidays. CM provided patient with a copy of TC handbook. Explained to her that she has 100/100 SNF days starting on 05/11/19. Days 1-20 are covered at 100% and days 21-100 are covered at 80% with a copay of about $186 a day prior to any secondary coverage. Patient verbalized understanding. CM did discuss HH with patient and she is agreeable to using [facility]. Freedom of choice verbally consented and placed in chart. She states that at time of discharge, her son in law or daughter will be able to provide transportation home. At present all questions have been answered. CM will continue to follow and intervene as necessary.        05/12/19 0939   Patient Type   Within 30 Days of Previous Admission? Yes  (04/11/19-04/13/19 [facility]H)   Healthcare Decisions   Interviewed: Patient   Orientation/Decision Making Abilities of Patient Alert and Oriented x3, able to make  decisions   Level of Function 2 Weeks Prior to Admission   Prior level of function Independent with ADLs   Level of Function Upon Admission   Current Level of Function Ambulates with assistive device;Needs assistance with ADLs;Up to chair with assistance   Prior to admission   Type of Residence Private residence   Home Layout One level   Have running water, electricity, heat, etc? Yes   Living Arrangements Spouse/significant other;Family members   How do you get to your MD appointments? daughter or son in law   How do you get your groceries? daughter   Who fixes your meals? daughter   Who does your laundry? daughter   Who picks up your prescriptions? daughter   DME Currently at [employer]	[place], [place];[employer]- 3-in-1 Bedside [employer];Hospital Bed;Home O2;Portable O2 Tank   Adult Protective Services (APS) involved? No   Discharge Planning   Support Systems Children;Family members   Patient expects to be discharged to: home   Anticipated [place] plan discussed with: Same as interviewed   Mode of transportation: Private car (family member)   Does the patient have perscription coverage? Yes   Consults/Providers   Correct PCP listed in Epic? Yes   Family and PCP   PCP on file was verified as the current PCP? Yes   Important Message from [facility] Notice   Patient received 1st IMM Letter? Yes

## 2019-05-13 DIAGNOSIS — I872 Venous insufficiency (chronic) (peripheral): Secondary | ICD-10-CM

## 2019-05-13 DIAGNOSIS — Z9981 Dependence on supplemental oxygen: Secondary | ICD-10-CM

## 2019-05-13 DIAGNOSIS — I509 Heart failure, unspecified: Secondary | ICD-10-CM

## 2019-05-13 DIAGNOSIS — L97312 Non-pressure chronic ulcer of right ankle with fat layer exposed: Secondary | ICD-10-CM

## 2019-05-13 DIAGNOSIS — R600 Localized edema: Secondary | ICD-10-CM

## 2019-05-13 LAB — CBC
Hematocrit: 33.5 % — ABNORMAL LOW (ref 36.0–48.0)
Hemoglobin: 10.7 gm/dL — ABNORMAL LOW (ref 12.0–16.0)
MCH: 28 pg (ref 28–35)
MCHC: 32 gm/dL (ref 31–36)
MCV: 89 fL (ref 80–100)
MPV: 6.1 fL (ref 6.0–10.0)
PLT CT: 154 10*3/uL (ref 130–440)
RBC: 3.78 10*6/uL — ABNORMAL LOW (ref 3.80–5.00)
RDW: 14.9 % — ABNORMAL HIGH (ref 10.5–14.5)
WBC: 6.6 10*3/uL (ref 4.0–11.0)

## 2019-05-13 LAB — CREATININE, SERUM
Creatinine: 0.81 mg/dL (ref 0.60–1.20)
EGFR: 69 mL/min/{1.73_m2} (ref 60–150)

## 2019-05-13 LAB — PT/INR
PT INR: 1.7 — ABNORMAL HIGH (ref 0.9–1.2)
PT: 17.5 s — ABNORMAL HIGH (ref 9.4–11.3)

## 2019-05-13 MED ORDER — VH WARFARIN THERAPY PLACEHOLDER
1.00 | Status: DC
Start: 2019-05-13 — End: 2019-05-16

## 2019-05-13 NOTE — Progress Notes (Signed)
Nutrition Therapy  Nutrition Assessment    Patient Information:     Name:[name]   Age: 79 y.o.   Sex: female     MRN: [id number]    Recommendation:     1. Pt to continue with cardiac diet/ 2 mg Na with easy to chew foods    Nutrition History:     Pt admitted for continue IV therapy with antibiotics for her cellulitis of R ankle, has CHF, on home oxygen therapy, CKD III. Visited pt while eating breakfast noticed SOB and reported feeling tired even to eat.    Nutrition Focus Physical Findings:  Elderly, chronically ill looking    Nutrition Risk Level: Low    Nutrition Diagnosis:       No nutrition diagnosis at this time          Monitoring:  Evaluation:    PO/EN/PN intake:  Total energy intake   Labs:  Electrolyte Profile, Renal Profile and Glucose, casual    GI Profile:  Bowel Function   Nutrition Focused Physical:  Overall appearance       Assessment Data:     Admission Dx:  Acute systolic (congestive) heart failure [I50.21]  Acute on chronic diastolic (congestive) heart failure [I50.33]  Diastolic CHF, acute on chronic [I50.33]  PMH:  has a past medical history of Aortic aneurysm, Atrial fibrillation, Chronic obstructive pulmonary disease, Coronary artery disease, Gastroesophageal reflux disease, Hypertension, Malignant neoplasm, Pacemaker, and SSS (sick sinus syndrome).  PSH:  has a past surgical history that includes Hysterectomy and Cardiac pacemaker placement.     Height:     Weight: 74.4 kg (164 lb 0.4 oz)   BMI: Body mass index is 28.15 kg/m.   IBW: 55 kg +/- 10%  Weight Monitoring Height Height Method Weight Weight Method   04/13/2019   76.3 kg Standing Scale   04/29/2019 165.1 cm  79.017 kg    05/05/2019 162.6 cm  75.4 kg Standing Scale   05/12/2019   74.4 kg Bed Scale   05/13/2019   78 kg Bed Scale     Weight Monitoring BMI (calculated)   04/13/2019    04/29/2019 29 kg/m2   05/05/2019    05/12/2019    05/13/2019        Pertinent Meds:  As reviewed - Coumadin, Lasix, Bio-K-Plus  Pertinent Labs: H/H  10.7/33.5  Recent Labs   Lab 05/11/19  0535 05/07/19  0610   Sodium 130* 133*   Potassium 4.0 4.1   Chloride 87* 88*   CO2 33.0* 36.0*   BUN 30* 64*   Creatinine 0.79 1.25*   Glucose 92 93   Calcium 10.1 10.0   Osmolality Calculated 267* 284          Diet Order:  Orders Placed This Encounter   Procedures   . Diet cardiac Na restriction, if any: 2 GM NA        GI symptoms:   +BM 1/12  Hydration:   I/O:  I/O last 3 completed shifts:  In: 200 [IV Piggyback:200]  Out: 1550 [Urine:1550]  I/O this shift:  In: -   Out: 800 [Urine:800]      Skin:  Braden scale 19, nutrition 3      Food Security Issues:  No      Learning Needs:  No education needs at this time    Estimated Needs:  Estimated Energy Needs  Total Energy Estimated Needs: 1363 - 1540 kcals/d  Method for Estimating Needs: 25 -  28 kcals/kg IBW 55 kg  Estimated Protein Needs  Total Protein Estimated Needs: 55 - 66 gm/d  Method for Estimating Needs: 1.0 - 1.2 gm/kg IBW 55 kg)       Additional Comments:       Leilani Merl, RD  05/13/2019 9:44 AM

## 2019-05-13 NOTE — Telephone Encounter (Signed)
Yes

## 2019-05-13 NOTE — Telephone Encounter (Signed)
Dr. Amada Jupiter, did you speak with Mrs. Orie Rout about her mother?

## 2019-05-13 NOTE — Progress Notes (Signed)
[name] was seen on 1/11.  I did not have time to do the note.  I am dictating it on 1/12.    I actually think she is getting better.  The antibiotics in the care for her right leg seem to be improving at least by way of pain control.  There is much less tenderness along the saphenous vein in the area of the 2 ulcerations.  I spoke with Dr. Lysle Rubens on the morning of 1/11 and reminded her of the consultation.  There was concern by wound therapy that the patient needed debridement of the ulcerations.  Photos of the wounds will be sent confidentially to Dr. Zollie Beckers.    Regarding her breathing.  That seems stable.  She likes to sit in a chair with a fan blowing on her but she is not dyspneic.  She is not collecting edema in her legs and her lungs sound clear.  She is currently getting 1 dose of intravenous Lasix per day.    She does have reactive airway disease which we are addressing with Spiriva and Breo.  She is not getting any nebulizer treatments.    Breath sounds are clear without wheezing.    Active Hospital Problems    Diagnosis   . Hyponatremia   . Cellulitis of right ankle   . Chronic kidney disease (CKD), active medical management without dialysis, stage 3 (moderate)   . Diastolic CHF, acute on chronic   . Venous stasis ulcer of right ankle with fat layer exposed without varicose veins   . Bilateral lower extremity edema   . Pulmonary HTN   . On home oxygen therapy   . Pacemaker   . Permanent atrial fibrillation     Electronically signed by Graciela Husbands D.O.

## 2019-05-13 NOTE — Consults (Signed)
Inpatient consult to general surgery  Consult performed by: Christoper Fabian, MD  Consult ordered by: Graciela Husbands, DO  Reason for consult: stasis ulcer RLE          CONSULTATION    Date Time: 05/13/19 9:01 AM  Patient Name: [name], [name]  MRN#: [id number]  DOB: [birth year]  Requesting Physician: Graciela Husbands, DO Medicine  Referred to: General Surgery    Reason for Consultation:   Stasis ulcer right ankle    Assessment:   Patient with long standing stasis disease BLE--combination of venous stasis and heart failure. Has never worn compression for any length of time. Currently the ulcer looks locally colonized with evidence of cellulitis, not just stasis dermatitis, based on the redness and the warmth of the leg.     Plan:   Stasis ulcers will not heal completely without the use of compression. The patient states that she cannot tolerate compression as she cannot stand anything on her leg. We do need to clean up the purulent drainage evident in the wound at this time. Will start with wet to dry with saline as she can barely stand to have her leg touched let alone locally debrided. She will try mild compression with ACE wraps at this time to see if she can tolerate this.  I will reassess the wound in a few days to see what is left to consider for sharp debridement.    The wound has very little chance of healing without patient compliance which is difficult at best for her. Goal would be to keep this from being a source for systemic infection and to try to minimize the pain associated with her stasis.    History:   [name] is a 79 y.o. female who presents to the hospital on 05/11/2019 with bilateral lower extremity venous stasis. She has had this problem for many years. She has never used compression though it appears to have been recommended in the past.  She states that over a year ago she developed a "blood blister" at her anterior and medial ankle. It has never healed. At home, she was leaving the  wound open without a dressing. She denies having an odor to the wound or significant drainage to the wound most of the time. She states that she can barely tolerate anything touching her leg.   Upon review of the chart, it appears she had been sleeping in a recliner with her legs dependent for quite some time. Dr. Amada Jupiter is working on getting her a hospital bed at home which will ultimately help with the stasis.  She has fairly severe chronic congestive heart failure and has had 3 admissions in the past 4 months due to increasing SOB and extremity edema. This most recent presentation was on Jan 3rd.   Of note, she did have a recent visit with her PCP who noted purulent drainage from the ulcer.  She also has a larger area behind the chronic ulcer that is listed as a skin tear.  She is very sedentary and does not ambulate or get up much at home. She is limited with generalized weakness and dyspnea. On home oxygen.    Past Medical History:     Past Medical History:   Diagnosis Date    Aortic aneurysm     Atrial fibrillation     Chronic obstructive pulmonary disease     Coronary artery disease     Gastroesophageal reflux disease     Hypertension  Malignant neoplasm     Pacemaker     SSS (sick sinus syndrome)      Patient Active Problem List   Diagnosis    CHF (congestive heart failure)    Permanent atrial fibrillation    Hyperkalemia    Chronic diastolic CHF (congestive heart failure)    On home oxygen therapy    Aortic regurgitation    Pacemaker    Pacemaker at end of battery life    Diaphragmatic eventration    Chronic interstitial lung disease    Thoracic aortic aneurysm without rupture    Difficulty breathing    Chronic respiratory failure with hypoxia, on home O2 therapy    Nonobstructive atherosclerosis of coronary artery    SSS (sick sinus syndrome)    Drug-induced constipation    Moderate asthma with acute exacerbation    Pulmonary HTN    Chronic venous insufficiency    Bilateral  lower extremity edema    Diastolic CHF, acute on chronic    Venous stasis ulcer of right ankle with fat layer exposed without varicose veins    Chronic kidney disease (CKD), active medical management without dialysis, stage 3 (moderate)    Cellulitis of right ankle    Hyponatremia         Past Surgical History:     Past Surgical History:   Procedure Laterality Date    CARDIAC PACEMAKER PLACEMENT      2002    HYSTERECTOMY         Family History:     Family History   Problem Relation Age of Onset    Breast cancer Sister        Social History:     Social History     Tobacco Use    Smoking status: Never Smoker    Smokeless tobacco: Never Used   Substance Use Topics    Alcohol use: No    Drug use: No       Allergies:     Allergies   Allergen Reactions    Adhesive [Wound Dressing Adhesive]     Codeine Hives     unknown      Fentanyl      Other reaction(s): psychological reaction  Hallucinations,     Morphine     Morphine And Related Hives     unknown      Nitroglycerin      Other reaction(s): neurological reaction    Pain Patch [Menthol]     Sulfa Antibiotics      unknown       Medications:     Current Facility-Administered Medications   Medication Dose Route Frequency    allopurinol  100 mg Oral Daily    cefTRIAXone  1 g Intravenous Q12H    dilTIAZem  240 mg Oral BID    docusate sodium  200 mg Oral Daily    fluorometholone  1 drop Left Eye daily at 2100    fluticasone furoate-vilanterol  1 puff Inhalation QAM    furosemide  40 mg Intravenous Daily    lactobacillus species  50 Billion CFU Oral Daily    latanoprost  1 drop Both Eyes QHS    oxyCODONE  10 mg Oral Q12H SCH    polyethylene glycol  17 g Oral Daily    potassium chloride  10 mEq Oral Daily    predniSONE  5 mg Oral Daily    sodium chloride (PF)  3 mL Intravenous Q8H    tiotropium  2 puff  Inhalation QAM    warfarin  2.5 mg Oral Once per day on Sun Mon Wed Thu Fri Sat    warfarin  5 mg Oral Q Tue (PM)    warfarin therapy  placeholder  1 each Does not apply See Admin Instructions       Home Meds:  Prior to Admission medications    Medication Sig Start Date End Date Taking? Authorizing Provider   albuterol (PROVENTIL) (2.5 MG/3ML) 0.083% nebulizer solution Take 2.5 mg by nebulization every 6 (six) hours as needed for Wheezing.    [provider]   allopurinol (ZYLOPRIM) 100 MG tablet Take 1 tablet (100 mg total) by mouth daily For gout prevention 03/01/19   Graciela Husbands, DO   atenolol (TENORMIN) 50 MG tablet TAKE 1 TABLET BY MOUTH EVERY DAY 03/31/19   Graciela Husbands, DO   diltiazem [facility]) 240 MG 24 hr capsule Take 240 mg by mouth 2 (two) times daily.       [provider]   fluorometholone (EFLONE) 0.1 % ophthalmic suspension Place 1 drop into the left eye 4 (four) times daily       [provider]   fluorometholone (FML) 0.1 % ophthalmic suspension  04/07/19   [provider]   fluticasone furoate-vilanterol (BREO ELLIPTA) 100-25 MCG/INH Aerosol Pwdr, Breath Activated Inhale 1 puff into the lungs daily 01/30/19   Graciela Husbands, DO   furosemide (LASIX) 40 MG tablet Take 1 tablet (40 mg total) by mouth 2 (two) times daily 04/29/19   Graciela Husbands, DO   HYDROcodone-acetaminophen [facility]) 5-325 MG per tablet Take 1 tablet by mouth every 6 (six) hours as needed for Pain 04/17/19   Graciela Husbands, DO   ibuprofen (ADVIL) 400 MG tablet Take 1 tablet (400 mg total) by mouth every 6 (six) hours as needed for Pain or Fever 04/13/19   [name], MD   latanoprost (XALATAN) 0.005 % ophthalmic solution Place 1 drop into both eyes nightly       [provider]   metOLazone (ZAROXOLYN) 2.5 MG tablet Take 1 tablet (2.5 mg total) by mouth daily 04/14/19   [name], MD   potassium chloride (K-DUR) 10 MEQ tablet Take 1 tablet (10 mEq total) by mouth daily 03/01/19   Graciela Husbands, DO   predniSONE (DELTASONE) 10 MG tablet Take 4,3,2, and then stay on 1 tablet per day 03/01/19   Graciela Husbands, DO   rosuvastatin  (CRESTOR) 10 MG tablet Take 1 tablet (10 mg total) by mouth daily 03/06/19   Graciela Husbands, DO   spironolactone (ALDACTONE) 25 MG tablet Take 1 tablet (25 mg total) by mouth daily 03/02/19   Graciela Husbands, DO   tiotropium (SPIRIVA RESPIMAT) 2.5 MCG/ACT inhalation spray Inhale 2 puffs into the lungs daily 01/30/19   Graciela Husbands, DO   warfarin (COUMADIN) 3 MG tablet Take 1 tablet (3 mg total) by mouth daily.  Patient taking differently: Take 5 mg by mouth daily 5mg  on Tuesday, and 2.5mg  the other days of the week.   11/07/13   Grams, Sasha L, DO         Review of Systems:   Review of Systems   Constitutional: Negative for fever, chills, diaphoresis. She has been afebrile during this hospital stay  Skin: See HPI  Hematological: Negative for adenopathy. Does not bruise/bleed easily.       No history of blood clots or known clotting disorder. No history of pulmonary embolism. She is chronically anticoagulated due  to her atrial fibrillation.        Physical Exam:     Vitals:    05/13/19 0827   BP: 129/58   Pulse:    Resp:    Temp:    SpO2:      74.4 kg (164 lb 0.4 oz)   Body mass index is 28.15 kg/m.     Intake and Output Summary (Last 24 hours) at Date Time    Intake/Output Summary (Last 24 hours) at 05/13/2019 0901  Last data filed at 05/13/2019 0509  Gross per 24 hour   Intake 200 ml   Output 1200 ml   Net -1000 ml       General: awake, alert, oriented x 3; no acute distress.  HEENT: eomi, sclera anicteric, mucous membranes moist, conjunctiva pink  Neck: supple, trachea midline, no JVD  Extremities: bilateral signs of chronic edema with current, evident improvement manifest by wrinkled skin on the lower third of the leg. Hyperpigmentation of the lower third of the leg. There is an ulceration in the medial anterior aspect of the leg above the malleolus on the right. It appears to have fat at the base of the wound if not tendon. Currently, there is soupy, purulent drainage. There is no odor to the wound. There is mild erythema of  the lower third of the right leg with warmth to the skin. Pulses difficult to palpate. There is also a scabbed area more posterior with no drainage and not open.  Please see PT note for measurements of the wounds.        Labs Reviewed:     Results     Procedure Component Value Units Date/Time    Prothrombin time/INR [[id number]]  (Abnormal) Collected: 05/13/19 0609    Specimen: Blood Updated: 05/13/19 0634     PT 17.5 sec      PT INR 1.7            Signed by: Christoper Fabian, MD

## 2019-05-14 ENCOUNTER — Telehealth: Payer: Self-pay

## 2019-05-14 LAB — PT/INR
PT INR: 1.5 — ABNORMAL HIGH (ref 0.9–1.2)
PT: 16.1 s — ABNORMAL HIGH (ref 9.4–11.3)

## 2019-05-14 MED ORDER — ALBUTEROL SULFATE HFA 108 (90 BASE) MCG/ACT IN AERS
2.00 | INHALATION_SPRAY | RESPIRATORY_TRACT | Status: DC | PRN
Start: 2019-05-14 — End: 2019-05-21
  Administered 2019-05-14 – 2019-05-16 (×3): 2 via RESPIRATORY_TRACT
  Filled 2019-05-14 (×2): qty 8

## 2019-05-14 MED ORDER — NON FORMULARY
90.00 ug | Status: DC | PRN
Start: 2019-05-14 — End: 2019-05-14

## 2019-05-14 NOTE — Plan of Care (Addendum)
NURSE NOTE SUMMARY  [facility] - MEDICAL/SURGICAL   Patient Name: [name]   Attending Physician: Graciela Husbands, DO   Today's date:   05/14/2019 LOS: 3 days   Shift Summary:                                                              1900 assumed care of patient, at rounding time patient expressed a need for pain medication  1930 in to speak with patient about pain medications available due to having scheduled medication and PRN. Patient stated "I'll watch jeopardy for now and wait for that scheduled one". This RN assure patient she will receive this medication as soon as possibly available. Patient is alert and watching tv. Bed is in low/locked position for safety. 3L O2 via NC.  Call bell and personal items are within reach.   2020 patient c/o feeling as though she was anxious and was worried about being unable to sleep. PRN medications administered as per MAR orders.     Provider Notifications:      Rapid Response Notifications:  Mobility:      PMP Activity: Step 6 - Walks in Room (05/14/2019  9:39 AM)     Weight tracking:  Family Dynamic:   Last 3 Weights for the past 72 hrs (Last 3 readings):   Weight   05/14/19 0700 76.8 kg (169 lb 5 oz)   05/13/19 1300 78 kg (171 lb 15.3 oz)   05/12/19 0655 74.4 kg (164 lb 0.4 oz)             Last Bowel Movement   Last BM Date: 05/12/19       Problem: Moderate/High Fall Risk Score >5  Goal: Patient will remain free of falls  Outcome: Progressing     Problem: Compromised Tissue integrity  Goal: Damaged tissue is healing and protected  Outcome: Progressing  Goal: Nutritional status is improving  Outcome: Progressing

## 2019-05-14 NOTE — Telephone Encounter (Signed)
pcp uses epic

## 2019-05-14 NOTE — UM Notes (Addendum)
[facility] Utilization Management Review Sheet    Facility :  [facility]    NAME: [name]  MR#: [id number]    CSN#: [id number]    ROOM: 217/217-A AGE: 79 y.o.    ADMIT DATE AND TIME: 05/11/2019  1:28 PM      PATIENT CLASS: TC/Swing Bed<> 05/11/2019 2:03 PM    ATTENDING PHYSICIAN: Graciela Husbands, DO  PAYOR:Payor: MEDICARE / Plan: MEDICARE PART A AND B / Product Type: Medicare /       AUTH #:     DIAGNOSIS: Active Problems:    Permanent atrial fibrillation    On home oxygen therapy    Pacemaker    Pulmonary HTN    Bilateral lower extremity edema    Diastolic CHF, acute on chronic    Venous stasis ulcer of right ankle with fat layer exposed without varicose veins    Chronic kidney disease (CKD), active medical management without dialysis, stage 3 (moderate)    Cellulitis of right ankle    Hyponatremia        HISTORY:   Past Medical History:   Diagnosis Date    Aortic aneurysm     Atrial fibrillation     Chronic obstructive pulmonary disease     Coronary artery disease     Gastroesophageal reflux disease     Hypertension     Malignant neoplasm     Pacemaker     SSS (sick sinus syndrome)        DATE OF REVIEW: 05/12/2019    VITALS: BP 153/84    Pulse 68    Temp 97.9 F (36.6 C) (Oral)    Resp 19    Wt 76.8 kg (169 lb 5 oz)    SpO2 97%    BMI 29.06 kg/m     Active Hospital Problems    Diagnosis    Hyponatremia    Cellulitis of right ankle    Chronic kidney disease (CKD), active medical management without dialysis, stage 3 (moderate)    Diastolic CHF, acute on chronic    Venous stasis ulcer of right ankle with fat layer exposed without varicose veins    Bilateral lower extremity edema    Pulmonary HTN    On home oxygen therapy    Pacemaker    Permanent atrial fibrillation       HPI<>    79 y.o. female patient that was admitted on 05/04/2019 she is an 79 year old woman well-known to me who presented to the emergency room on 1/4 with difficulty breathing associated with severe swelling in  her legs.  This was despite maximum medical therapy.  She has chronic biventricular failure with underlying chronic diastolic left ventricular dysfunction, permanent atrial fibrillation with moderately severe pulmonary hypertension with resultant chronic right ventricular failure.  As a result mild exertion at home creates symptoms.  She generally has some component of edema when outside the hospital.  We are able to rid her of edema in the hospital but it soon reaccumulates when she goes home.  She tries her best to control her fluid intake and her sodium intake but her social structure at home, despite the efforts of a daughter who lives with her is difficult at best.  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  These are currently a source of cellulitis and she is getting  intravenous antibiotics.  This morning I note that her C-reactive protein is jumped over 8 mg/dL.  This is a new finding.  Her white count is normal.  She has no fever.  She has a lot of pain and tenderness in the area.  I have asked general surgery to consult and they will see her later this week.  She probably needs debridement.  This is based on the wound evaluation done by our physical therapy department.  She is slow to move and get around.  She spends most of her day in a reclining chair with a fan blowing on her on oxygen at 3 to 4 L/min.  She also has asthmatic airway disease which response to bronchodilators.  That has not been an issue since she has been here.  We diuresed her earlier this week.  We also had trouble with constipation.  She is currently on Lasix 40 mg daily.  She does have an indwelling Foley.  He is moving to transitional care did today for further antibiotic therapy and for the surgical evaluation tomorrow or the next day.  I will also have PT/OT see here to see if we can get some  modest improvement.  I spoke to the patient and her daughter today.  I even discussed hospice to see if that is an option when she goes home.      H&P<>Dr Amada Jupiter    On exam she is a chronically ill woman with plethoric cheeks.  She also has facial telangiectasias.  There are telangiectasias noted in the chest wall.  She has a lot of ecchymoses on her arms  Heart S1-S2 rhythm irregular grade 1 systolic murmur noted.  No gallop  Lungs with diminished breath sounds but clear bilaterally without wheezes or rales  Abdomen is soft and nontender  She has deeply pigmented lower extremities with 2-10 mm ulcerations noted that are rather deep in the skin around the right medial malleolus.  There are indicative of venous stasis ulcers.    Plan: The patient is moving to transitional care for antibiotics etc. as I mentioned above.  Surgery will see her.  We are trying to get a home plan together which I think will include hospice at the time of discharge.    Electronically signed by Graciela Husbands D.O.        TC/Swing Bed    IT/inr. Wound care as instructed. IS. Telemetry. FLuid restriction. Daily weight. Oxygen 2L per nc.  Foley. Consult PT. Consult OT. Rocephin 1gm IV Q24H.  Lasix 40mg  IV daily.  Oxycodone 10mg  po Q12H. Prednisone 5mg  po daily. Percocet 5/325mg  1 tab po Q4H prn.         CSR<> 05/18/2019<> Dr Glennon Hamilton    Assessment and Plan:      #Chronic stasis of the left ankle  -Follow-up with surgery  -Status post recent status visit with surgery  -Currently on Rocephin    #History of A. Fib, episode of A. fib with RVR on 1/14 2021 complicated with acute CHF  -Received 1 dose of Cardizem 5 mg on 1/14  -Metoprolol 50 mg twice daily was added to her regimen  On 1/14   needs to follow-up with cardiology   -Rate controlled  -Continue diltiazem  -Continue warfarin  -Still INR is subtherapeutic I would initiate the patient on full dose Lovenox, she is started on warfarin 5 mg daily on 1/16 2021    #Mild diastolic heart failure  -   Resume to home Lasix  40 mg BID     #History of COPD  -Continue Breo continue prednisone continue Spiriva    #Debility  -Continue PT/OT    Disposition: Home         Meets MCG<> [id number]    Lowella Dell RN,CCM/UR    [facility]  [address]  [place], [place] [address]    [facility]: [phone]  FX: [phone]

## 2019-05-14 NOTE — Progress Notes (Signed)
Medicine Progress Note   [facility]  [employer]   Patient Name: [name],[name]: 3 days   Attending Physician: Graciela Husbands, DO PCP: Graciela Husbands, DO      Hospital Course:                                                            [name] is a 79 y.o. female patient past medical history significant atrial fibrillation, pulmonary hypertension, venous stasis ulcer on the right ankle, chronic kidney disease stage III, history of sick sinus syndrome status post pacemaker next chronic stasis with lower extremity edema diastolic heart failure was presented to this facility for rehab     Assessment and Plan:      #Chronic stasis of the left ankle  -Follow-up with surgery  -Status post recent status visit with surgery  -Currently on Rocephin    #History of A. fib  -Rate controlled  -Continue diltiazem  -Continue warfarin    #Soft diastolic heart failure  -Continue furosemide    #History of COPD  -Continue Breo continue prednisone continue Spiriva    #Debility  -Continue PT/OT    Disposition: Home  DVT PPX:  Anticoagulated  Code:  NO CPR - SUPPORT OK     Subjective           Objective   Physical Exam:     Vitals: T:97.9 F (36.6 C) (Oral), BP:(!) 146/91, HR:68, RR:19, SaO2:97%    General: Patient is awake. In no acute distress.  HEENT: No conjunctival drainage, vision is intact, anicteric sclera.  Neck: Supple, no thyromegaly.  Chest: CTA bilaterally. No rhonchi, no wheezing. No use of accessory muscles.  CVS: Normal rate and regular rhythm no murmurs, without JVD, no pitting edema, pulses palpable.  Abdomen: Soft, non-tender, no guarding or rigidity, with normal bowel sounds.  Extremities: No calf swelling and no gross deformity.  Skin: Warm, dry, no rash and no worrisome lesions.  NEURO: No motor or sensory deficits.  Psychiatric: Alert, interactive, appropriate, normal affect.  Weight Monitoring 04/12/2019 04/13/2019 04/29/2019 05/05/2019 05/12/2019 05/13/2019 05/14/2019   Height - -  165.1 cm 162.6 cm - - -   Height Method - - - - - - -   Weight 77.4 kg 76.3 kg 79.017 kg 75.4 kg 74.4 kg 78 kg 76.8 kg   Weight Method Standing Scale Standing Scale - Standing Scale Bed Scale Bed Scale Standing Scale   BMI (calculated) - - 29 kg/m2 - - - -         Intake/Output Summary (Last 24 hours) at 05/14/2019 1816  Last data filed at 05/14/2019 1200  Gross per 24 hour   Intake --   Output 1426 ml   Net -1426 ml     Body mass index is 29.06 kg/m.     Meds:     Current Facility-Administered Medications   Medication Dose Route Frequency   . allopurinol  100 mg Oral Daily   . cefTRIAXone  1 g Intravenous Q12H   . dilTIAZem  240 mg Oral BID   . docusate sodium  200 mg Oral Daily   . fluorometholone  1 drop Left Eye daily at 2100   . fluticasone furoate-vilanterol  1 puff Inhalation QAM   . furosemide  40 mg  Intravenous Daily   . lactobacillus species  50 Billion CFU Oral Daily   . latanoprost  1 drop Both Eyes QHS   . oxyCODONE  10 mg Oral Q12H SCH   . polyethylene glycol  17 g Oral Daily   . potassium chloride  10 mEq Oral Daily   . predniSONE  5 mg Oral Daily   . sodium chloride (PF)  3 mL Intravenous Q8H   . tiotropium  2 puff Inhalation QAM   . warfarin  2.5 mg Oral Once per day on Sun Mon Wed Thu Fri Sat   . warfarin  5 mg Oral Q Tue (PM)   . warfarin therapy placeholder  1 each Does not apply See Admin Instructions   . warfarin therapy placeholder  1 each Does not apply See Admin Instructions       PRN Meds: acetaminophen, albuterol, albuterol sulfate HFA, cyclobenzaprine, hydrOXYzine, magnesium hydroxide, melatonin, naloxone, oxyCODONE-acetaminophen.     LABS:     Estimated Creatinine Clearance: 55.5 mL/min (based on SCr of 0.81 mg/dL).  Recent Labs   Lab 05/13/19  0609 05/11/19  0535   WBC 6.6 8.6   RBC 3.78* 4.11   Hemoglobin 10.7* 11.2*   Hematocrit 33.5* 37.0   MCV 89 90   PLT CT 154 144     Recent Labs   Lab 05/14/19  0635 05/13/19  0609 05/12/19  0620   PT 16.1* 17.5* 21.1*   PT INR 1.5* 1.7* 2.0*          No results found for: HGBA1CPERCNT  Recent Labs   Lab 05/13/19  0609 05/11/19  0535   Glucose  --  92   Sodium  --  130*   Potassium  --  4.0   Chloride  --  87*   CO2  --  33.0*   BUN  --  30*   Creatinine 0.81 0.79   EGFR 69 71   Calcium  --  10.1               Invalid input(s):  AMORPHOUSUA   Patient Lines/Drains/Airways Status    Active PICC Line / CVC Line / PIV Line / Drain / Airway / Intraosseous Line / Epidural Line / ART Line / Line / Wound / Pressure Ulcer / NG/OG Tube     Name:   Placement date:   Placement time:   Site:   Days:    Peripheral IV 05/04/19 20 G Right Antecubital   05/04/19    0316    Antecubital   10    Wound 05/05/19 Leg Right;Lower;Medial;Distal   05/05/19    1447    Leg   9    Wound 05/05/19 Skin Tear Leg Right;Lower;Medial   05/05/19    1500    Leg   9               No results found.   Home Health Needs:  There are no questions and answers to display.       Nutrition assessment done in collaboration with Registered Dietitians:     Time spent:      [name], MD     05/14/19,6:16 PM   MRN: [id number]                                      CSN: [id number] DOB: October 20, 1940

## 2019-05-15 LAB — ECG 12-LEAD
Patient Age: 78 years
Q-T Interval(Corrected): 476 ms
Q-T Interval: 338 ms
QRS Axis: 18 deg
QRS Duration: 115 ms
T Axis: 58 years
Ventricular Rate: 119 //min

## 2019-05-15 LAB — PT/INR
PT INR: 1.6 — ABNORMAL HIGH (ref 0.9–1.2)
PT: 16.6 s — ABNORMAL HIGH (ref 9.4–11.3)

## 2019-05-15 MED ORDER — METOPROLOL TARTRATE 25 MG PO TABS
50.0000 mg | ORAL_TABLET | Freq: Two times a day (BID) | ORAL | Status: DC
Start: 2019-05-15 — End: 2019-05-21
  Administered 2019-05-15 – 2019-05-21 (×13): 50 mg via ORAL
  Filled 2019-05-15 (×13): qty 2

## 2019-05-15 MED ORDER — DILTIAZEM HCL 5 MG/ML IV SOLN (WRAP)
5.00 mg | Freq: Once | INTRAVENOUS | Status: AC
Start: 2019-05-15 — End: 2019-05-15
  Administered 2019-05-15: 10:00:00 5 mg via INTRAVENOUS
  Filled 2019-05-15: qty 5

## 2019-05-15 NOTE — Progress Notes (Signed)
Medicine Progress Note   [facility]  [employer]   Patient Name: [name],[name] LOS: 4 days   Attending Physician: Graciela Husbands, DO PCP: Graciela Husbands, DO      Hospital Course:                                                            [name] is a 79 y.o. female patient past medical history significant atrial fibrillation, pulmonary hypertension, venous stasis ulcer on the right ankle, chronic kidney disease stage III, history of sick sinus syndrome status post pacemaker next chronic stasis with lower extremity edema diastolic heart failure was presented to this facility for rehab     Assessment and Plan:      #Chronic stasis of the left ankle  -Follow-up with surgery  -Status post recent status visit with surgery  -Currently on Rocephin    #History of A. Fib, episode of A. fib with RVR on 1/14 2021  -Received 1 dose of Cardizem 5 mg  -Metoprolol 50 mg twice daily was added to her regimen   needs to follow-up with cardiology   -Rate controlled  -Continue diltiazem  -Continue warfarin    #Soft diastolic heart failure  -Continue furosemide    #History of COPD  -Continue Breo continue prednisone continue Spiriva    #Debility  -Continue PT/OT    Disposition: Home  DVT PPX:  Anticoagulated  Code:  NO CPR - SUPPORT OK     Subjective           Objective   Physical Exam:     Vitals: T:98.1 F (36.7 C) (Oral), BP:126/55, HR:76, RR:16, SaO2:95%    General: Patient is awake. In no acute distress.  HEENT: No conjunctival drainage, vision is intact, anicteric sclera.  Neck: Supple, no thyromegaly.  Chest: CTA bilaterally. No rhonchi, no wheezing. No use of accessory muscles.  CVS: Normal rate and regular rhythm no murmurs, without JVD, no pitting edema, pulses palpable.  Abdomen: Soft, non-tender, no guarding or rigidity, with normal bowel sounds.  Extremities: No calf swelling and no gross deformity.  Skin: Warm, dry, no rash and no worrisome lesions.  NEURO: No motor or sensory  deficits.  Psychiatric: Alert, interactive, appropriate, normal affect.  Weight Monitoring 04/13/2019 04/29/2019 05/05/2019 05/12/2019 05/13/2019 05/14/2019 05/15/2019   Height - 165.1 cm 162.6 cm - - - -   Height Method - - - - - - -   Weight 76.3 kg 79.017 kg 75.4 kg 74.4 kg 78 kg 76.8 kg 80.2 kg   Weight Method Standing Scale - Standing Scale Bed Scale Bed Scale Standing Scale Bed Scale   BMI (calculated) - 29 kg/m2 - - - - -         Intake/Output Summary (Last 24 hours) at 05/15/2019 1812  Last data filed at 05/15/2019 1743  Gross per 24 hour   Intake 420 ml   Output 3 ml   Net 417 ml     Body mass index is 30.35 kg/m.     Meds:     Current Facility-Administered Medications   Medication Dose Route Frequency   . allopurinol  100 mg Oral Daily   . cefTRIAXone  1 g Intravenous Q12H   . dilTIAZem  240 mg Oral BID   .  docusate sodium  200 mg Oral Daily   . fluorometholone  1 drop Left Eye daily at 2100   . fluticasone furoate-vilanterol  1 puff Inhalation QAM   . furosemide  40 mg Intravenous Daily   . lactobacillus species  50 Billion CFU Oral Daily   . latanoprost  1 drop Both Eyes QHS   . metoprolol tartrate  50 mg Oral Q12H SCH   . oxyCODONE  10 mg Oral Q12H SCH   . polyethylene glycol  17 g Oral Daily   . potassium chloride  10 mEq Oral Daily   . predniSONE  5 mg Oral Daily   . sodium chloride (PF)  3 mL Intravenous Q8H   . tiotropium  2 puff Inhalation QAM   . warfarin  2.5 mg Oral Once per day on Sun Mon Wed Thu Fri Sat   . warfarin  5 mg Oral Q Tue (PM)   . warfarin therapy placeholder  1 each Does not apply See Admin Instructions   . warfarin therapy placeholder  1 each Does not apply See Admin Instructions       PRN Meds: acetaminophen, albuterol, albuterol sulfate HFA, cyclobenzaprine, hydrOXYzine, magnesium hydroxide, melatonin, naloxone, oxyCODONE-acetaminophen.     LABS:     Estimated Creatinine Clearance: 56.8 mL/min (based on SCr of 0.81 mg/dL).  Recent Labs   Lab 05/13/19  0609 05/11/19  0535   WBC 6.6 8.6    RBC 3.78* 4.11   Hemoglobin 10.7* 11.2*   Hematocrit 33.5* 37.0   MCV 89 90   PLT CT 154 144     Recent Labs   Lab 05/15/19  0620 05/14/19  0635 05/13/19  0609   PT 16.6* 16.1* 17.5*   PT INR 1.6* 1.5* 1.7*         No results found for: HGBA1CPERCNT  Recent Labs   Lab 05/13/19  0609 05/11/19  0535   Glucose  --  92   Sodium  --  130*   Potassium  --  4.0   Chloride  --  87*   CO2  --  33.0*   BUN  --  30*   Creatinine 0.81 0.79   EGFR 69 71   Calcium  --  10.1               Invalid input(s):  AMORPHOUSUA   Patient Lines/Drains/Airways Status    Active PICC Line / CVC Line / PIV Line / Drain / Airway / Intraosseous Line / Epidural Line / ART Line / Line / Wound / Pressure Ulcer / NG/OG Tube     Name:   Placement date:   Placement time:   Site:   Days:    Peripheral IV 05/04/19 20 G Right Antecubital   05/04/19    0316    Antecubital   10    Wound 05/05/19 Leg Right;Lower;Medial;Distal   05/05/19    1447    Leg   9    Wound 05/05/19 Skin Tear Leg Right;Lower;Medial   05/05/19    1500    Leg   9               No results found.   Home Health Needs:  There are no questions and answers to display.       Nutrition assessment done in collaboration with Registered Dietitians:     Time spent:      [name], MD     05/15/19,6:12 PM  MRN: [id number]                                      CSN: [id number] DOB: 23-Nov-1940

## 2019-05-15 NOTE — Progress Notes (Signed)
NURSE NOTE SUMMARY  [facility] - MEDICAL/SURGICAL   Patient Name: [name]   Attending Physician: Graciela Husbands, DO   Today's date:   05/15/2019 LOS: 4 days   Shift Summary:                                                              Marchita is awake and alert at time of assessment, sitting up in recliner. She denies any needs or concerns at this time. She rates her pain 0/10 but voiced that she "hopes there's a pain pill in there" when administering morning medications; this RN reassured patient that scheduled Oxycodone was in with her morning medicine. Her call bell and all personal belongings are within reach. 3L O2 via NC in place; SpO2 94%.     Provider Notifications:        Rapid Response Notifications:  Mobility:      PMP Activity: Step 6 - Walks in Room (05/15/2019  9:21 AM)     Weight tracking:  Family Dynamic:   Last 3 Weights for the past 72 hrs (Last 3 readings):   Weight   05/15/19 0651 80.2 kg (176 lb 12.9 oz)   05/14/19 0700 76.8 kg (169 lb 5 oz)   05/13/19 1300 78 kg (171 lb 15.3 oz)             Last Bowel Movement   Last BM Date: 05/12/19

## 2019-05-15 NOTE — Progress Notes (Addendum)
Dr. Zollie Beckers in to see patient regarding BLE venous stasis. Wet to dry dressing reapplied following consult per orders.

## 2019-05-15 NOTE — Progress Notes (Signed)
PROGRESS NOTE    Date Time: 05/15/19 8:23 PM  Patient Name: [name]      Assessment:   Chronic stasis ulcer of right ankle. Present for over a year. Discussed many treatment options with the patient including outpatient wound care, home health nursing with family support, extended stay in Transitional care fr wound care. She is resistant to all of these modalities. She will continue at home as she has in the past with leaving the wound open. She understands the risk of worsening of the wound and of infection.    Plan:   Continue wet to dry dressings while hospitalized. She did not tolerate attempt at local debridement of eschar or desiccated fat in the wound.     Subjective:   Continues to have pain in the leg and the wound. She cannot wait to get home where she can get up and walk which makes the leg feel better.    Medications:     Current Facility-Administered Medications   Medication Dose Route Frequency    allopurinol  100 mg Oral Daily    cefTRIAXone  1 g Intravenous Q12H    dilTIAZem  240 mg Oral BID    docusate sodium  200 mg Oral Daily    fluorometholone  1 drop Left Eye daily at 2100    fluticasone furoate-vilanterol  1 puff Inhalation QAM    furosemide  40 mg Intravenous Daily    lactobacillus species  50 Billion CFU Oral Daily    latanoprost  1 drop Both Eyes QHS    metoprolol tartrate  50 mg Oral Q12H SCH    oxyCODONE  10 mg Oral Q12H SCH    polyethylene glycol  17 g Oral Daily    potassium chloride  10 mEq Oral Daily    predniSONE  5 mg Oral Daily    sodium chloride (PF)  3 mL Intravenous Q8H    tiotropium  2 puff Inhalation QAM    warfarin  2.5 mg Oral Once per day on Sun Mon Wed Thu Fri Sat    warfarin  5 mg Oral Q Tue (PM)    warfarin therapy placeholder  1 each Does not apply See Admin Instructions    warfarin therapy placeholder  1 each Does not apply See Admin Instructions     acetaminophen, albuterol, albuterol sulfate HFA, cyclobenzaprine, hydrOXYzine,  magnesium hydroxide, melatonin, naloxone, oxyCODONE-acetaminophen        Review of Systems:   Review of Systems   Constitutional: Negative for fever, chills, diaphoresis, appetite change and unexpected weight change.       Physical Exam:   Temp:  [97.7 F (36.5 C)-98.1 F (36.7 C)] 98.1 F (36.7 C)  Heart Rate:  [76-104] 76  Resp Rate:  [16-18] 16  BP: (124-161)/(55-111) 126/55    Intake and Output Summary (Last 24 hours) at Date Time    Intake/Output Summary (Last 24 hours) at 05/15/2019 2023  Last data filed at 05/15/2019 1743  Gross per 24 hour   Intake 420 ml   Output 3 ml   Net 417 ml       General: awake, alert, oriented x 3; no acute distress.  HEENT: eomi, sclera anicteric, mucous membranes moist  Neck: supple, trachea midline, no JVD  Right leg: with no significant edema. The wound is cleaner, no erythema. There is minimal desiccated fat around the bowl of the wound. Attempt at debridement with scissors not tolerated and she asked me to stop. Wound re-dressed  with wet to dry    Labs:     Results     Procedure Component Value Units Date/Time    Prothrombin time/INR [[id number]]  (Abnormal) Collected: 05/15/19 0620    Specimen: Blood Updated: 05/15/19 0642     PT 16.6 sec      PT INR 1.6          Microbiology, reviewed and are significant for:  Microbiology Results     None                Signed by: Christoper Fabian

## 2019-05-16 ENCOUNTER — Inpatient Hospital Stay: Payer: Medicare Other

## 2019-05-16 LAB — CBC
Hematocrit: 39.1 % (ref 36.0–48.0)
Hemoglobin: 11.8 gm/dL — ABNORMAL LOW (ref 12.0–16.0)
MCH: 27 pg — ABNORMAL LOW (ref 28–35)
MCHC: 30 gm/dL — ABNORMAL LOW (ref 31–36)
MCV: 91 fL (ref 80–100)
MPV: 5.9 fL — ABNORMAL LOW (ref 6.0–10.0)
PLT CT: 163 10*3/uL (ref 130–440)
RBC: 4.29 10*6/uL (ref 3.80–5.00)
RDW: 15.5 % — ABNORMAL HIGH (ref 10.5–14.5)
WBC: 8 10*3/uL (ref 4.0–11.0)

## 2019-05-16 LAB — CREATININE, SERUM
Creatinine: 0.92 mg/dL (ref 0.60–1.20)
EGFR: 59 mL/min/{1.73_m2} — ABNORMAL LOW (ref 60–150)

## 2019-05-16 LAB — PT/INR
PT INR: 1.4 — ABNORMAL HIGH (ref 0.9–1.2)
PT: 14.7 s — ABNORMAL HIGH (ref 9.4–11.3)

## 2019-05-16 MED ORDER — WARFARIN SODIUM 1 MG PO TABS
1.0000 mg | ORAL_TABLET | Freq: Every day | ORAL | Status: DC
Start: 2019-05-16 — End: 2019-05-21
  Administered 2019-05-16: 17:00:00 1 mg via ORAL
  Administered 2019-05-19: 18:00:00 5 mg via ORAL
  Administered 2019-05-20: 18:00:00 2.5 mg via ORAL
  Filled 2019-05-16 (×2): qty 5
  Filled 2019-05-16: qty 3

## 2019-05-16 MED ORDER — VH WARFARIN THERAPY PLACEHOLDER
1.00 | Status: DC
Start: 2019-05-16 — End: 2019-05-21

## 2019-05-16 MED ORDER — FUROSEMIDE 10 MG/ML IJ SOLN
40.0000 mg | Freq: Two times a day (BID) | INTRAMUSCULAR | Status: DC
Start: 2019-05-17 — End: 2019-05-17
  Administered 2019-05-17: 08:00:00 40 mg via INTRAVENOUS
  Filled 2019-05-16 (×2): qty 4

## 2019-05-16 NOTE — Progress Notes (Signed)
Medicine Progress Note   [facility]  [employer]   Patient Name: Cummings,Merilynn [name] LOS: 5 days   Attending Physician: Graciela Husbands, DO PCP: Graciela Husbands, DO      Hospital Course:                                                            Itzamar Traynor Balbach is a 79 y.o. female patient past medical history significant atrial fibrillation, pulmonary hypertension, venous stasis ulcer on the right ankle, chronic kidney disease stage III, history of sick sinus syndrome status post pacemaker next chronic stasis with lower extremity edema diastolic heart failure was presented to this facility for rehab     Assessment and Plan:      #Chronic stasis of the left ankle  -Follow-up with surgery  -Status post recent status visit with surgery  -Currently on Rocephin    #History of A. Fib, episode of A. fib with RVR on 1/14 2021 placated with acute CHF  -Received 1 dose of Cardizem 5 mg  -Metoprolol 50 mg twice daily was added to her regimen   needs to follow-up with cardiology   -Rate controlled  -Continue diltiazem  -Continue warfarin    #Mild diastolic heart failure  -Lasix 40 mg IV.    #History of COPD  -Continue Breo continue prednisone continue Spiriva    #Debility  -Continue PT/OT    Disposition: Home  DVT PPX:  Anticoagulated  Code:  NO CPR - SUPPORT OK     Subjective           Objective   Physical Exam:     Vitals: T:97.9 F (36.6 C) (Oral), BP:101/56, HR:70, RR:20, SaO2:95%    General: Patient is awake. In no acute distress.  HEENT: No conjunctival drainage, vision is intact, anicteric sclera.  Neck: Supple, no thyromegaly.  Chest: CTA bilaterally. No rhonchi, no wheezing. No use of accessory muscles.  CVS: Normal rate and regular rhythm no murmurs, without JVD, no pitting edema, pulses palpable.  Abdomen: Soft, non-tender, no guarding or rigidity, with normal bowel sounds.  Extremities: No calf swelling and no gross deformity.  Skin: Warm, dry, no rash and no worrisome lesions.  NEURO: No motor  or sensory deficits.  Psychiatric: Alert, interactive, appropriate, normal affect.  Weight Monitoring 04/29/2019 05/05/2019 05/12/2019 05/13/2019 05/14/2019 05/15/2019 05/16/2019   Height 165.1 cm 162.6 cm - - - - -   Height Method - - - - - - -   Weight 79.017 kg 75.4 kg 74.4 kg 78 kg 76.8 kg 80.2 kg 79.5 kg   Weight Method - Standing Scale Bed Scale Bed Scale Standing Scale Bed Scale -   BMI (calculated) 29 kg/m2 - - - - - -         Intake/Output Summary (Last 24 hours) at 05/16/2019 1934  Last data filed at 05/16/2019 1243  Gross per 24 hour   Intake 600 ml   Output 2 ml   Net 598 ml     Body mass index is 30.08 kg/m.     Meds:     Current Facility-Administered Medications   Medication Dose Route Frequency   . allopurinol  100 mg Oral Daily   . dilTIAZem  240 mg Oral BID   . docusate sodium  200  mg Oral Daily   . fluorometholone  1 drop Left Eye daily at 2100   . fluticasone furoate-vilanterol  1 puff Inhalation QAM   . [START ON 05/17/2019] furosemide  40 mg Intravenous BID   . lactobacillus species  50 Billion CFU Oral Daily   . latanoprost  1 drop Both Eyes QHS   . metoprolol tartrate  50 mg Oral Q12H SCH   . oxyCODONE  10 mg Oral Q12H SCH   . polyethylene glycol  17 g Oral Daily   . potassium chloride  10 mEq Oral Daily   . predniSONE  5 mg Oral Daily   . sodium chloride (PF)  3 mL Intravenous Q8H   . tiotropium  2 puff Inhalation QAM   . warfarin  1-5 mg Oral Daily at 1800   . warfarin therapy placeholder  1 each Does not apply See Admin Instructions       PRN Meds: acetaminophen, albuterol, albuterol sulfate HFA, cyclobenzaprine, hydrOXYzine, magnesium hydroxide, melatonin, naloxone, oxyCODONE-acetaminophen.     LABS:     Estimated Creatinine Clearance: 49.7 mL/min (based on SCr of 0.92 mg/dL).  Recent Labs   Lab 05/16/19  0505 05/13/19  0609   WBC 8.0 6.6   RBC 4.29 3.78*   Hemoglobin 11.8* 10.7*   Hematocrit 39.1 33.5*   MCV 91 89   PLT CT 163 154     Recent Labs   Lab 05/16/19  0505 05/15/19  0620 05/14/19  0635    PT 14.7* 16.6* 16.1*   PT INR 1.4* 1.6* 1.5*         No results found for: [id number]  Recent Labs   Lab 05/16/19  0505 05/13/19  0609 05/11/19  0535   Glucose  --   --  92   Sodium  --   --  130*   Potassium  --   --  4.0   Chloride  --   --  87*   CO2  --   --  33.0*   BUN  --   --  30*   Creatinine 0.92 0.81 0.79   EGFR 59* 69 71   Calcium  --   --  10.1               Invalid input(s):  AMORPHOUSUA   Patient Lines/Drains/Airways Status    Active PICC Line / CVC Line / PIV Line / Drain / Airway / Intraosseous Line / Epidural Line / ART Line / Line / Wound / Pressure Ulcer / NG/OG Tube     Name:   Placement date:   Placement time:   Site:   Days:    Peripheral IV 05/04/19 20 G Right Antecubital   05/04/19    0316    Antecubital   10    Wound 05/05/19 Leg Right;Lower;Medial;Distal   05/05/19    1447    Leg   9    Wound 05/05/19 Skin Tear Leg Right;Lower;Medial   05/05/19    1500    Leg   9               Xr Chest Ap Portable    Result Date: 05/16/2019  1.  Findings suggestive of mild congestive heart failure, though inflammation such as bronchitis/bronchiolitis cannot be excluded and clinical correlation is recommended. 2.  Probable bibasilar atelectasis versus inflammation. ReadingStation:[email or web address]     Home Health Needs:  There are no questions and answers to display.  Nutrition assessment done in collaboration with Registered Dietitians:     Time spent:      [name], MD     05/16/19,7:34 PM   MRN: [id number]                                      CSN: [id number] DOB: 1940-06-12

## 2019-05-16 NOTE — Progress Notes (Signed)
Patient laying in bed with foot of bed elevated; HOB 30 degree. Frequently used items within reach, including call bell.  No complaints or needs expressed at this time. Will Continue to monitor.

## 2019-05-16 NOTE — Progress Notes (Signed)
[name] is awake and alert at time of assessment, sitting up in bed. She denies any pain, needs or concerns at this time. Her call bell and all personal belongings are within reach. 3L O2 via NC in place; SpO2 95%.    Visit Vitals  BP 134/69   Pulse 75   Temp 98.1 F (36.7 C) (Oral)   Resp 16   Wt 80.2 kg (176 lb 12.9 oz)   SpO2 95%   BMI 30.35 kg/m

## 2019-05-17 ENCOUNTER — Inpatient Hospital Stay: Payer: Medicare Other

## 2019-05-17 LAB — CBC
Hematocrit: 35.3 % — ABNORMAL LOW (ref 36.0–48.0)
Hemoglobin: 10.9 gm/dL — ABNORMAL LOW (ref 12.0–16.0)
MCH: 28 pg (ref 28–35)
MCHC: 31 gm/dL (ref 31–36)
MCV: 90 fL (ref 80–100)
MPV: 7.3 fL (ref 6.0–10.0)
PLT CT: 172 10*3/uL (ref 130–440)
RBC: 3.91 10*6/uL (ref 3.80–5.00)
RDW: 15.7 % — ABNORMAL HIGH (ref 10.5–14.5)
WBC: 7.3 10*3/uL (ref 4.0–11.0)

## 2019-05-17 LAB — CREATININE, SERUM
Creatinine: 1.1 mg/dL (ref 0.60–1.20)
EGFR: 48 mL/min/{1.73_m2} — ABNORMAL LOW (ref 60–150)

## 2019-05-17 LAB — D-DIMER, QUANTITATIVE: D-Dimer: 2.32 mg/L FEU — ABNORMAL HIGH (ref 0.19–0.52)

## 2019-05-17 LAB — PT/INR
PT INR: 1.4 — ABNORMAL HIGH (ref 0.9–1.2)
PT: 15 s — ABNORMAL HIGH (ref 9.4–11.3)

## 2019-05-17 MED ORDER — WARFARIN SODIUM 5 MG PO TABS
5.0000 mg | ORAL_TABLET | Freq: Every day | ORAL | Status: DC
Start: 2019-05-17 — End: 2019-05-19
  Administered 2019-05-17 – 2019-05-18 (×2): 5 mg via ORAL
  Filled 2019-05-17 (×2): qty 1

## 2019-05-17 MED ORDER — IOHEXOL 350 MG/ML IV SOLN
90.00 mL | Freq: Once | INTRAVENOUS | Status: DC | PRN
Start: 2019-05-17 — End: 2019-05-17

## 2019-05-17 MED ORDER — VH WARFARIN THERAPY PLACEHOLDER
1.00 | Status: DC
Start: 2019-05-17 — End: 2019-05-19

## 2019-05-17 MED ORDER — PREDNISONE 20 MG PO TABS
40.0000 mg | ORAL_TABLET | Freq: Every morning | ORAL | Status: DC
Start: 2019-05-17 — End: 2019-05-20
  Administered 2019-05-17 – 2019-05-20 (×4): 40 mg via ORAL
  Filled 2019-05-17 (×4): qty 2

## 2019-05-17 MED ORDER — FUROSEMIDE 40 MG PO TABS
40.0000 mg | ORAL_TABLET | Freq: Two times a day (BID) | ORAL | Status: DC
Start: 2019-05-17 — End: 2019-05-21
  Administered 2019-05-17 – 2019-05-21 (×8): 40 mg via ORAL
  Filled 2019-05-17 (×8): qty 1

## 2019-05-17 NOTE — Progress Notes (Signed)
Medicine Progress Note   [facility]  [employer]   Patient Name: [name], [name] LOS: 6 days   Attending Physician: Graciela Husbands, DO PCP: Graciela Husbands, DO      Hospital Course:                                                            [name] is a 79 y.o. female patient past medical history significant atrial fibrillation, pulmonary hypertension, venous stasis ulcer on the right ankle, chronic kidney disease stage III, history of sick sinus syndrome status post pacemaker next chronic stasis with lower extremity edema diastolic heart failure was presented to this facility for rehab     Assessment and Plan:      #Chronic stasis of the left ankle  -Follow-up with surgery  -Status post recent status visit with surgery  -Currently on Rocephin    #History of A. Fib, episode of A. fib with RVR on 1/14 2021 placated with acute CHF  -Received 1 dose of Cardizem 5 mg  -Metoprolol 50 mg twice daily was added to her regimen   needs to follow-up with cardiology   -Rate controlled  -Continue diltiazem  -Continue warfarin    #Mild diastolic heart failure  -  Resume to home Lasix 40 mg BID     #History of COPD  -Continue Breo continue prednisone continue Spiriva    #Debility  -Continue PT/OT    Disposition: Home  DVT PPX:  Anticoagulated  Code:  NO CPR - SUPPORT OK     Subjective           Objective   Physical Exam:     Vitals: T:97.7 F (36.5 C) (Oral), BP:129/60, HR:86, RR:18, SaO2:95%    General: Patient is awake. In no acute distress.  HEENT: No conjunctival drainage, vision is intact, anicteric sclera.  Neck: Supple, no thyromegaly.  Chest: CTA bilaterally. No rhonchi, no wheezing. No use of accessory muscles.  CVS: Normal rate and regular rhythm no murmurs, without JVD, no pitting edema, pulses palpable.  Abdomen: Soft, non-tender, no guarding or rigidity, with normal bowel sounds.  Extremities: No calf swelling and no gross deformity.  Skin: Warm, dry, no rash and no worrisome  lesions.  NEURO: No motor or sensory deficits.  Psychiatric: Alert, interactive, appropriate, normal affect.  Weight Monitoring 04/29/2019 05/05/2019 05/12/2019 05/13/2019 05/14/2019 05/15/2019 05/16/2019   Height 165.1 cm 162.6 cm - - - - -   Height Method - - - - - - -   Weight 79.017 kg 75.4 kg 74.4 kg 78 kg 76.8 kg 80.2 kg 79.5 kg   Weight Method - Standing Scale Bed Scale Bed Scale Standing Scale Bed Scale -   BMI (calculated) 29 kg/m2 - - - - - -         Intake/Output Summary (Last 24 hours) at 05/17/2019 1644  Last data filed at 05/17/2019 0532  Gross per 24 hour   Intake 120 ml   Output --   Net 120 ml     Body mass index is 30.08 kg/m.     Meds:     Current Facility-Administered Medications   Medication Dose Route Frequency   . allopurinol  100 mg Oral Daily   . dilTIAZem  240 mg Oral BID   .  docusate sodium  200 mg Oral Daily   . fluorometholone  1 drop Left Eye daily at 2100   . fluticasone furoate-vilanterol  1 puff Inhalation QAM   . furosemide  40 mg Oral BID   . lactobacillus species  50 Billion CFU Oral Daily   . latanoprost  1 drop Both Eyes QHS   . metoprolol tartrate  50 mg Oral Q12H SCH   . oxyCODONE  10 mg Oral Q12H SCH   . polyethylene glycol  17 g Oral Daily   . potassium chloride  10 mEq Oral Daily   . predniSONE  40 mg Oral QAM W/BREAKFAST   . sodium chloride (PF)  3 mL Intravenous Q8H   . tiotropium  2 puff Inhalation QAM   . warfarin  1-5 mg Oral Daily at 1800   . warfarin  5 mg Oral Daily at 1800   . warfarin therapy placeholder  1 each Does not apply See Admin Instructions   . warfarin therapy placeholder  1 each Does not apply See Admin Instructions       PRN Meds: acetaminophen, albuterol, albuterol sulfate HFA, cyclobenzaprine, hydrOXYzine, iohexol, magnesium hydroxide, melatonin, naloxone, oxyCODONE-acetaminophen.     LABS:     Estimated Creatinine Clearance: 41.6 mL/min (based on SCr of 1.1 mg/dL).  Recent Labs   Lab 05/17/19  0510 05/16/19  0505   WBC 7.3 8.0   RBC 3.91 4.29   Hemoglobin  10.9* 11.8*   Hematocrit 35.3* 39.1   MCV 90 91   PLT CT 172 163     Recent Labs   Lab 05/17/19  0510 05/16/19  0505 05/15/19  0620   PT 15.0* 14.7* 16.6*   PT INR 1.4* 1.4* 1.6*         No results found for: HGBA1CPERCNT  Recent Labs   Lab 05/17/19  0510 05/16/19  0505 05/13/19  0609 05/11/19  0535   Glucose  --   --   --  92   Sodium  --   --   --  130*   Potassium  --   --   --  4.0   Chloride  --   --   --  87*   CO2  --   --   --  33.0*   BUN  --   --   --  30*   Creatinine 1.10 0.92 0.81 0.79   EGFR 48* 59* 69 71   Calcium  --   --   --  10.1               Invalid input(s):  AMORPHOUSUA   Patient Lines/Drains/Airways Status    Active PICC Line / CVC Line / PIV Line / Drain / Airway / Intraosseous Line / Epidural Line / ART Line / Line / Wound / Pressure Ulcer / NG/OG Tube     Name:   Placement date:   Placement time:   Site:   Days:    Peripheral IV 05/04/19 20 G Right Antecubital   05/04/19    0316    Antecubital   10    Wound 05/05/19 Leg Right;Lower;Medial;Distal   05/05/19    1447    Leg   9    Wound 05/05/19 Skin Tear Leg Right;Lower;Medial   05/05/19    1500    Leg   9               Xr Chest Ap Portable  Result Date: 05/16/2019  1.  Findings suggestive of mild congestive heart failure, though inflammation such as bronchitis/bronchiolitis cannot be excluded and clinical correlation is recommended. 2.  Probable bibasilar atelectasis versus inflammation. ReadingStation:[email or web address]     Home Health Needs:  There are no questions and answers to display.       Nutrition assessment done in collaboration with Registered Dietitians:     Time spent:      [name], MD     05/17/19,4:44 PM   MRN: [id number]                                      CSN: [id number] DOB: [birth year]

## 2019-05-17 NOTE — Progress Notes (Signed)
Patient laying in bed with foot of bed elevated; HOB 60 degree. Frequently used items within reach, including call bell. Patient c/o SOB. Respiratory gave patient a breathing treatment. Patient felt very anxious. Gave Patient Anti anxiety med's and she felt much better.  No complaints or needs expressed at this time. Will Continue to monitor

## 2019-05-18 LAB — PT/INR
PT INR: 1.4 — ABNORMAL HIGH (ref 0.9–1.2)
PT: 15.1 s — ABNORMAL HIGH (ref 9.4–11.3)

## 2019-05-18 MED ORDER — ENOXAPARIN SODIUM 80 MG/0.8ML SC SOLN
1.00 mg/kg | Freq: Two times a day (BID) | SUBCUTANEOUS | Status: DC
Start: 2019-05-18 — End: 2019-05-20
  Administered 2019-05-18 – 2019-05-20 (×5): 80 mg via SUBCUTANEOUS
  Filled 2019-05-18 (×5): qty 0.8

## 2019-05-18 NOTE — Progress Notes (Addendum)
Medicine Progress Note   [facility]  [employer]   Patient Name: [name], [name] LOS: 7 days   Attending Physician: Graciela Husbands, DO PCP: Graciela Husbands, DO      Hospital Course:                                                            [name] is a 79 y.o. female patient past medical history significant atrial fibrillation, pulmonary hypertension, venous stasis ulcer on the right ankle, chronic kidney disease stage III, history of sick sinus syndrome status post pacemaker next chronic stasis with lower extremity edema diastolic heart failure was presented to this facility for rehab     Assessment and Plan:      #Chronic stasis of the left ankle  -Follow-up with surgery  -Status post recent status visit with surgery  -Currently on Rocephin    #History of A. Fib, episode of A. fib with RVR on 1/14 2021 complicated with acute CHF  -Received 1 dose of Cardizem 5 mg on 1/14  -Metoprolol 50 mg twice daily was added to her regimen  On 1/14   needs to follow-up with cardiology   -Rate controlled  -Continue diltiazem  -Continue warfarin  -Still INR is subtherapeutic I would initiate the patient on full dose Lovenox, she is started on warfarin 5 mg daily on 1/16 2021    #Mild diastolic heart failure  -  Resume to home Lasix 40 mg BID     #History of COPD  -Continue Breo continue prednisone continue Spiriva    #Debility  -Continue PT/OT    Disposition: Home  DVT PPX:  Anticoagulated  Code:  NO CPR - SUPPORT OK     Subjective           Objective   Physical Exam:     Vitals: T:(!) 96.4 F (35.8 C) (Oral), BP:145/75, HR:75, RR:16, SaO2:96%    General: Patient is awake. In no acute distress.  HEENT: No conjunctival drainage, vision is intact, anicteric sclera.  Neck: Supple, no thyromegaly.  Chest: CTA bilaterally. No rhonchi, no wheezing. No use of accessory muscles.  CVS: Normal rate and regular rhythm no murmurs, without JVD, no pitting edema, pulses palpable.  Abdomen: Soft, non-tender, no  guarding or rigidity, with normal bowel sounds.  Extremities: No calf swelling and no gross deformity.  Skin: Warm, dry, no rash and no worrisome lesions.  NEURO: No motor or sensory deficits.  Psychiatric: Alert, interactive, appropriate, normal affect.  Weight Monitoring 05/05/2019 05/12/2019 05/13/2019 05/14/2019 05/15/2019 05/16/2019 05/18/2019   Height 162.6 cm - - - - - -   Height Method - - - - - - -   Weight 75.4 kg 74.4 kg 78 kg 76.8 kg 80.2 kg 79.5 kg 74.7 kg   Weight Method Standing Scale Bed Scale Bed Scale Standing Scale Bed Scale - Bed Scale   BMI (calculated) - - - - - - -       No intake or output data in the 24 hours ending 05/18/19 1148  Body mass index is 28.27 kg/m.     Meds:     Current Facility-Administered Medications   Medication Dose Route Frequency    allopurinol  100 mg Oral Daily    dilTIAZem  240 mg Oral  BID    docusate sodium  200 mg Oral Daily    enoxaparin  1 mg/kg Subcutaneous Q12H    fluorometholone  1 drop Left Eye daily at 2100    fluticasone furoate-vilanterol  1 puff Inhalation QAM    furosemide  40 mg Oral BID    lactobacillus species  50 Billion CFU Oral Daily    latanoprost  1 drop Both Eyes QHS    metoprolol tartrate  50 mg Oral Q12H SCH    oxyCODONE  10 mg Oral Q12H SCH    polyethylene glycol  17 g Oral Daily    potassium chloride  10 mEq Oral Daily    predniSONE  40 mg Oral QAM W/BREAKFAST    sodium chloride (PF)  3 mL Intravenous Q8H    tiotropium  2 puff Inhalation QAM    warfarin  1-5 mg Oral Daily at 1800    warfarin  5 mg Oral Daily at 1800    warfarin therapy placeholder  1 each Does not apply See Admin Instructions    warfarin therapy placeholder  1 each Does not apply See Admin Instructions       PRN Meds: acetaminophen, albuterol, albuterol sulfate HFA, cyclobenzaprine, hydrOXYzine, magnesium hydroxide, melatonin, naloxone, oxyCODONE-acetaminophen.     LABS:     Estimated Creatinine Clearance: 40.4 mL/min (based on SCr of 1.1 mg/dL).  Recent Labs    Lab 05/17/19  0510 05/16/19  0505   WBC 7.3 8.0   RBC 3.91 4.29   Hemoglobin 10.9* 11.8*   Hematocrit 35.3* 39.1   MCV 90 91   PLT CT 172 163     Recent Labs   Lab 05/18/19  0510 05/17/19  0510 05/16/19  0505   PT 15.1* 15.0* 14.7*   PT INR 1.4* 1.4* 1.4*         No results found for: HGBA1CPERCNT  Recent Labs   Lab 05/17/19  0510 05/16/19  0505 05/13/19  0609   Creatinine 1.10 0.92 0.81   EGFR 48* 59* 69               Invalid input(s):  AMORPHOUSUA   Patient Lines/Drains/Airways Status    Active PICC Line / CVC Line / PIV Line / Drain / Airway / Intraosseous Line / Epidural Line / ART Line / Line / Wound / Pressure Ulcer / NG/OG Tube     Name:   Placement date:   Placement time:   Site:   Days:    Peripheral IV 05/04/19 20 G Right Antecubital   05/04/19    0316    Antecubital   10    Wound 05/05/19 Leg Right;Lower;Medial;Distal   05/05/19    1447    Leg   9    Wound 05/05/19 Skin Tear Leg Right;Lower;Medial   05/05/19    1500    Leg   9               Ct Chest Wo Contrast    Result Date: 05/17/2019  There is cardiomegaly with new small pleural effusions. The patient may have mild congestive heart failure. There is a small chronic pericardial effusion. There is new mild hazy density in the lungs and interlobular septal thickening. 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 There is chronic cardiomegaly and a pacemaker. Patient motion obscures the lung markings, and I cannot accurately evaluate for interstitial lung disease. For full details, please see the above report. ReadingStation:[email or web address]    Xr Chest  Ap Portable    Result Date: 05/16/2019  1.  Findings suggestive of mild congestive heart failure, though inflammation such as bronchitis/bronchiolitis cannot be excluded and clinical correlation is recommended. 2.  Probable bibasilar atelectasis versus inflammation. ReadingStation:[email or web address]     Home Health Needs:  There are no questions and answers to display.       Nutrition assessment done in  collaboration with Registered Dietitians:     Time spent:      [name], MD     05/18/19,11:48 AM   MRN: [id number]                                      CSN: [id number] DOB: [birth year]

## 2019-05-18 NOTE — Nursing Progress Note (Signed)
Pt up in [facility] this shift resting with no distress noted. Stated she feels much better today. O2 3l via nc remains on with O2 sat of 97%. Pt remains with sob on exertion. No audible wheezes noted. Pt remains with occasional moist non-productive cough. Pt ambulated to BR with stand by assist & walker. Tolerated it well. Medicated for pain as ordered & atarax this shift with good effect. Will continue to monitor.

## 2019-05-18 NOTE — Progress Notes (Signed)
Patient laying in bed with foot of bed elevated; HOB 60 degree. Frequently used items within reach, including call bell. No complaints or needs expressed at this time. Will Continue to monitor

## 2019-05-19 LAB — PT/INR
PT INR: 1.9 — ABNORMAL HIGH (ref 0.9–1.2)
PT: 19.7 s — ABNORMAL HIGH (ref 9.4–11.3)

## 2019-05-19 NOTE — Progress Notes (Signed)
CM spoke with PT who feel that patient should be able to discharge home on Wednesday 05/21/19. Notice of Medicare Non Coverage verbally consented by patient and placed in her chart. CM did attempt to call her daughter Elnita Maxwell to update her, but had to leave a voicemail. Confirmed with patient that she wishes to use [facility] at time of discharge. CM will attempt to contact patients daughter again.        05/19/19 1540   CM Review   Date of most recent [facility] given: 05/19/19

## 2019-05-19 NOTE — Patient Care Conference (Signed)
[facility]    INTERDISCIPLINARY CARE PLAN   TEAM CONFERENCE NOTE    Patient: [name] [facility]  Admission Date: 05/11/2019    Date of Team Conference:  05/20/19     TEAM MEMBERS PRESENT     Dr. Ellene Route, FNP  Margie Billet, RN/CM  Lang Snow, DPT  Paulita Fujita, RRT  Oliver Hum, RD  Waymond Cera, RPh  Jose Persia, BSN  Amparo Bristol, RN   Loree Fee, RN   Eloise Levels, RN    Racheal May, CNA  Consuela Mimes, [place]    MEDICAL     Extensive discussion re: patient's ongoing medical management issues, as well as medical and rehabilitative functional progress, plan of care, and discharge planning.    Active Problems:   Active Hospital Problems    Diagnosis    Hyponatremia    Cellulitis of right ankle    Chronic kidney disease (CKD), active medical management without dialysis, stage 3 (moderate)    Diastolic CHF, acute on chronic    Venous stasis ulcer of right ankle with fat layer exposed without varicose veins    Bilateral lower extremity edema    Pulmonary HTN    On home oxygen therapy    Pacemaker    Permanent atrial fibrillation     Isolation: None    Medications Scheduled:   Current Facility-Administered Medications   Medication Dose Route Frequency    allopurinol  100 mg Oral Daily    dilTIAZem  240 mg Oral BID    docusate sodium  200 mg Oral Daily    enoxaparin  1 mg/kg Subcutaneous Q12H    fluorometholone  1 drop Left Eye daily at 2100    fluticasone furoate-vilanterol  1 puff Inhalation QAM    furosemide  40 mg Oral BID    lactobacillus species  50 Billion CFU Oral Daily    latanoprost  1 drop Both Eyes QHS    metoprolol tartrate  50 mg Oral Q12H SCH    oxyCODONE  10 mg Oral Q12H SCH    polyethylene glycol  17 g Oral Daily    potassium chloride  10 mEq Oral Daily    predniSONE  40 mg Oral QAM W/BREAKFAST    sodium chloride (PF)  3 mL Intravenous Q8H    tiotropium  2 puff Inhalation QAM    warfarin  1-5 mg Oral Daily at 1800     warfarin  5 mg Oral Daily at 1800    warfarin therapy placeholder  1 each Does not apply See Admin Instructions    warfarin therapy placeholder  1 each Does not apply See Admin Instructions     Medications PRN: acetaminophen, albuterol, albuterol sulfate HFA, cyclobenzaprine, hydrOXYzine, magnesium hydroxide, melatonin, naloxone, oxyCODONE-acetaminophen    Medication changes (changes from previous week): See [facility]    NURSING      Pain:    Pain controlled with medication    Over the last week, the patient has rated pain 0/10 at the minimum and 10/10 at the maximum.  Over the last 24 hours, the patients maximum pain rating has been 5/10.      Patient was encouraged to notify staff if pain becomes unbearable. Patient was educated on pain relieving techniques including: Extremity Elevation, Pharmacological Interventions (see MAR), Repositioning and Rest.  The Patient was encouraged to notify staff as needed to assist with managing pain.  Nursing will continue to assess pain and effectiveness of non-pharmacological  and pharmacological interventions.       DVT prophylaxis:    Lovenox SQ Prophylasis  Coumadin                Bowel/Bladder:  BLADDER FUNCTION  Bladder management:Standby Assist  BOWEL FUNCTION  Bowel management:Standby Assist        Skin:    Braden Scale  Sensory Perceptions: No impairment  Moisture: Rarely moist  Activity: Walks occasionally  Mobility: Slightly limited  Nutrition: Adequate  Friction and Shear: Potential problem  Braden Scale Score: 19    Braden Skin Interventions  Sensory Perception Interventions: Repositioned q2 hrs, keep pressure off injured skin  Moisture level Interventions: Offer hourly toileting opportunities  Activity/Mobility Interventions: Offered assistance with transfers/ambulation to increase activity  Nutrition Interventions: Assisted patient with eating PRN  Friction and Shear Interventions: Draw sheet placed from patient's shoulders to hips to reposition    Venous statis  ulcer RLE      Patient/Family Education Needs:  Anticoagulation teaching has been completed              Nursing Long Term Goals -    Patient will remain free of falls during hospitalization  Patient will be free from injury during hospitalization  Damaged tissue will be healing and is protected  Perineal skin integrity is maintained or improved     Nursing Interventions:  Anticoagulation, Blowel/Bladder Management, Oral Medication(s), Pain Management, PMP:  mobility/ambulation and Skin Care    Is the patient making progress towards nursing goals?  Progressing    Comments/Issues:     The above nursing section completed by:  Ardith Dark, RN     PHYSICAL THERAPY     Physical Therapy is ordered for this patient      Bed Mobility:  Supine to Sit:   Modified independence .   HOB elevated     Transfers:  Sit to Stand:  Supervision with Front wheeled walker  Stand to Sit:  Supervision      Locomotion:  LEVEL AMBULATION:  Distance: 75-120'   Assistance level:  Supervision  Device:  Front wheeled walker  Pattern:  Reciprocal    Weight-bearing status: Full weight bearing            Long Term Goals:   1. Decrease wound dimensions by >/= 10% to facilitate healing and wound closure; Discontinued  2. Pt will perform supine <> sit with MOD I for increased (I) with bed mobility; MET  3. Pt will amb >100' using FWW with MOD I for increased (I) with household mobility; ONGOING      Physical Therapy for Exercise, Gait training, Neuromuscular re-education, Functional transfer training, LE strengthening/ROM, Patient/caregiver training       Is the patient making progress towards physical therapy goals? Progressing    The above physical therapy section completed by:  Lang Snow, DPT    OCCUPATIONAL THERAPY     Occupational Therapy is ordered for this patient         Eating:  Independent;      Grooming (general):   Supervision/Set up     UB Drg:  Supervision/Set up     LB Drg:   Minimal assist, to use dressing stick to thread LE's,  otherwise supervision/setup for LB dressing, does not wear socks, uses slippers     Transfers:  Sit to Stand:  Supervision with Front wheeled walker.        Stand to Sit:  Supervision.  Chair: Supervision with Front wheeled walker.        Toilet: Supervision with Front wheeled walker.        Functional mobility/ambulation: Supervision with Front wheeled walker.       Bathing:  Supervision/Set up; seated/standing     Toileting:  Supervision/Set up;     Cognition:    Oriented to: Oriented x4  Command following: Follows 1 step commands with repetition  Alertness/Arousal: Appropriate responses to stimuli   Attention Span:Attends to task with redirection  Memory: Decreased short term memory  Safety Awareness: minimal verbal instruction  Insights: Educated in safety awareness  Problem Solving: Assistance required to generate solutions, Assistance required to implement solutions, minimal assistance         Long Term Goals:    1. Pt will be mod-(I) with ADLs, light IADLs (e.g., item retrieval, simple meal prep), bathroom transfers and household mobility with DME/AE/AT prn in order to maximize pt's functional independence and safety prior to discharge to ensure safe and successful transition to independent living. =ongoing  2. Pt/family will demonstrate good understanding of discharge needs and recommendations (e.g., DME, AE, AT, safety techniques, fall prevention, recommendation for HHOT prn, etc) to ensure safe and successful transition from hospital to home environment. =ongoing    Occupational Therapy for   ADL retraining, functional transfer training, UE strengthening/ROM, activity pacing, cognition (safety awareness, problem solving, etc.), patient/family training, equipment eval/education, neuro muscular re-education, compensatory technique education    Is the patient making progress towards occupational therapy goals?  Progressing    Comments/Issues:  Pt much less anxious this session, reporting she was having  a good day.  She requires assist to problem solve use of AE, reports she cannot use reacher, educated on use of dressing stick instead.  Pt reporting peri area itching, RN Will notified.  Recommend supervision upon discharge as pt is currently at a supervision level, may benefit from tub transfer bench and dressing stick.    The above occupational therapy section completed by:  Anda Kraft,  OTR/L      SPEECH LANGUAGE PATHOLOGY     Speech/ Language Therapy is not ordered for this patient.       NUTRITION THERAPY     The patient Follow up trigger for a Nutrition Screen.    Orders Placed This Encounter   Procedures    Diet cardiac Na restriction, if any: 2 GM NA     This patient did not have a Significant Weight Change:       Wt Readings from Last 1 Encounters:   05/19/19 0709 78.2 kg (172 lb 6.4 oz)   05/18/19 0713 74.7 kg (164 lb 10.9 oz)   05/16/19 1243 79.5 kg (175 lb 4.3 oz)   05/15/19 0651 80.2 kg (176 lb 12.9 oz)   05/14/19 0700 76.8 kg (169 lb 5 oz)   05/13/19 1300 78 kg (171 lb 15.3 oz)   05/12/19 0655 74.4 kg (164 lb 0.4 oz)     The patients meal intake is adequate at >75% for estimated needs               Comments/Issues:   Nursing will continue to monitor weights  Will continue to monitor intakes  Patient to continue with diet plan.      The above nutrition therapy section completed by:  Oliver Hum, MS, RD    RESPIRATORY THERAPY     Respiratory Therapy is ordered for this patient.     Patient, prior to  admission, was on oxygen at 2-3 L/min via nasal cannula.     Patient was not using a Positive Airway Pressure system prior to admission.     Respiratory Therapy Orders: Proventil nebulizer 2.5 mg Q6H PRN, Breo Ellipta 100-25 1 puff QAM, Spiriva 2 puffs QAM, Proventil HFA  2 puffs Q4H PRN     Comments/Issues:     The above respiratory therapy section completed by: D Klonowski RRT-ACCS, AE-C     ACTIVITIES PLAN     Is the patient participating in activities?  Yes and See [facility] (Transitional  Care) activities Flowsheet for detailed information of weekly participation in leisure actvities     The above activities plan section completed by:  Lorenda Ishihara, RN BSN     CASE MANAGEMENT / SOCIAL SERVICE / DISCHARGE PLAN       Discharge Plan Update:  Home with family/other      DME to order:   TBD closer to discharge        Anticipated Discharge Services:    To be determined closer to discharge     A Patient/Family conference is not needed.      Plan communicated to: Dyane Dustman - Case management to discuss discharge disposition with patient and family (with patient's permission).    Anticipated date of discharge: TBD pending progress    Scheduled Follow-up appointments: none at this time    The above case management/social service/discharge plan section completed by:  Margie Billet, RN, BSN    Continued Skilled Care Involves the Following   (Section to be completed at the end of the team meeting)     Changes to the current plan of care:   no changes are needed to current plan of care    BARRIERS TO REHABILITATIVE GOALS  BARRIERS TO DISCHARGE  Complex medical management     Patient continues to demonstrate reasonable potential for functional and/ or medical improvement from intensive and comprehensive skilled services under the direction of attending physician.      Continue with the recommended plan of care as above per IDT members in attendance.

## 2019-05-19 NOTE — OT Plan of Care Note (Signed)
[facility]    [facility]  [address]  [place], [place] [address]    Department of Rehabilitation  [phone]    Hibaq Nakao [facility] CSN: [id number]  MEDICAL/SURGICAL 217/217-A    Occupational Therapy General Note  Time: 1527    Pt seenfor OT tx, pt likely being discharge in 2 days and will defer progress note until tomorrow.  Pt currently at a supervision to Min  A level for self care.      Communication: ("X" All that apply)       RN        PA/NP        MD        PT/OT/SLP X       Patient X       Family/caregiver        Other (specify)       Pt/family Education:  ("X" All That Apply)        Benefits of therapy        Effects of immobility        Effects on d/c plan        MD order of therapy        Other (specify)      Plan: Will follow up at next schedules session per POC.     [name] L Shemaiah Round, OTR/L

## 2019-05-19 NOTE — Progress Notes (Signed)
Readmission Risk Follow up  [facility] - MEDICAL/SURGICAL   Patient Name: [facility] [name]   Attending Physician: Graciela Husbands, DO   Today's date:   05/19/2019 LOS: 8 days   Expected Discharge Date Expected Discharge Date: 05/21/19    Readmission Assessment:                                                              Discharge Planning  Expected Discharge Date: 05/21/19  ReAdmit Risk Score: 28  Does the patient have perscription coverage?: Yes  Confirmed PCP with Pt: Yes  Confirmed PCP name: (P) Graciela Husbands  Last PCP Visit Date: (P) 04/29/19  Confirm Transport to F/U Appt.: (P) Self/Private Vehicle/Friend  Social Work Referral: (P) Not Applicable  Anticipated Home Health at [place]: (P) Yes  Name of Home Health Agency Placement: [facility]) [facility] - Home Health Services  Referral made for home health RN visit?: (P) Yes  Anticipated Placement at [place]: (P) No  CM Comments: (P) 1/18-discharging home on Wednesday, 1/20 with [facility] PT, OT, SN       Provider Notifications:

## 2019-05-20 ENCOUNTER — Encounter: Payer: Medicare Other | Admitting: Interventional Cardiology

## 2019-05-20 ENCOUNTER — Ambulatory Visit: Payer: Medicare Other

## 2019-05-20 ENCOUNTER — Other Ambulatory Visit (RURAL_HEALTH_CENTER): Payer: Self-pay | Admitting: Internal Medicine

## 2019-05-20 LAB — PT/INR
PT INR: 2.3 — ABNORMAL HIGH (ref 0.9–1.2)
PT: 24.2 s — ABNORMAL HIGH (ref 9.4–11.3)

## 2019-05-20 MED ORDER — PREDNISONE 10 MG PO TABS
10.0000 mg | ORAL_TABLET | Freq: Every morning | ORAL | Status: DC
Start: 2019-05-21 — End: 2019-05-21
  Administered 2019-05-21: 08:00:00 10 mg via ORAL
  Filled 2019-05-20: qty 1

## 2019-05-20 NOTE — OT Progress Note (Signed)
OT Progress Note    [facility]  Department of Rehabilitation Services  [phone]    [facility]    Occupational Therapy Transitional Care Discharge Note    Corinna Marbry [facility]    CSN#: [id number]  MEDICAL/SURGICAL 217/217-A    Date:  05/20/19    Precautions and Contraindications:   Falls  Skin & Pressure Ulcer Precautions  Bleeding Precautions     History of Present Illness:   Medical Diagnosis: Acute systolic (congestive) heart failure [I50.21]  Acute on chronic diastolic (congestive) heart failure [I50.33]  Diastolic CHF, acute on chronic [I50.33]    Therapy Diagnosis:  Weakness, decreased activity tolerance, decreased (I) with ADLs, decreased (I) with functional transfers/mobility    [name] is a 79 y.o. female admitted on 05/11/2019 with further IV antibiotic, nursing and rehabilitative needs due to CHF exacerbation complicated by RLE ulceration with cellulitis requiring surgical consultation for potential debridement, per review of H&P. Pt with past medical history significant for aortic aneurysm, a-fib on chronic anticoagulation and with pacemaker, COPD on chronic supplemental O2 (2-3L/min via nasal cannula), CAD, GERD, HTN, chronic venous insufficiency, CKD-III and recent difficulties with cellulitis of R-ankle and hyponatremia.       Patient Active Problem List   Diagnosis   . CHF (congestive heart failure)   . Permanent atrial fibrillation   . Hyperkalemia   . Chronic diastolic CHF (congestive heart failure)   . On home oxygen therapy   . Aortic regurgitation   . Pacemaker   . Pacemaker at end of battery life   . Diaphragmatic eventration   . Chronic interstitial lung disease   . Thoracic aortic aneurysm without rupture   . Difficulty breathing   . Chronic respiratory failure with hypoxia, on home O2 therapy   . Nonobstructive atherosclerosis of coronary artery   . SSS (sick sinus syndrome)   . Drug-induced constipation   . Moderate asthma with acute exacerbation    . Pulmonary HTN   . Chronic venous insufficiency   . Bilateral lower extremity edema   . Diastolic CHF, acute on chronic   . Venous stasis ulcer of right ankle with fat layer exposed without varicose veins   . Chronic kidney disease (CKD), active medical management without dialysis, stage 3 (moderate)   . Cellulitis of right ankle   . Hyponatremia        X-Rays/Tests/Labs:  Ct Chest Wo Contrast    Result Date: 05/17/2019  There is cardiomegaly with new small pleural effusions. The patient may have mild congestive heart failure. There is a small chronic pericardial effusion. There is new mild hazy density in the lungs and interlobular septal thickening. 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 There is chronic cardiomegaly and a pacemaker. Patient motion obscures the lung markings, and I cannot accurately evaluate for interstitial lung disease. For full details, please see the above report. ReadingStation:[email or web address]    Xr Chest Ap Portable    Result Date: 05/16/2019  1.  Findings suggestive of mild congestive heart failure, though inflammation such as bronchitis/bronchiolitis cannot be excluded and clinical correlation is recommended. 2.  Probable bibasilar atelectasis versus inflammation. ReadingStation:[email or web address]        Past Medical/Surgical History:  Past Medical History:   Diagnosis Date   . Aortic aneurysm    . Atrial fibrillation    . Chronic obstructive pulmonary disease    . Coronary artery disease    . Gastroesophageal reflux disease    . Hypertension    .  Malignant neoplasm    . Pacemaker    . SSS (sick sinus syndrome)       Past Surgical History:   Procedure Laterality Date   . CARDIAC PACEMAKER PLACEMENT      2002   . HYSTERECTOMY           Social History:   Home Living Arrangements:  Living Arrangements: Pt reports that her daughter and son-in-law reside with her in the basement  Prior Level of Supervision:-Overnight supervision = caregiver physically present overnight for safety and/or  physical assistance  Pt reports that daughter works during the day and her son-in-law does not provide physical assistance  Assistance Available: Part time, daughter and son-in-law reportedly assists with IADLs prn    Type of Home: House  Home Layout: One level, Ramped entrance, Able to live on main level with bedroom and bathroom, Basement, that adult children reside in but pt does not access basement  Bathroom:  raised toilet, can push up from adjacent sink;  tub/shower unit, grab bars in shower, hand-held shower, but pt has been completing sponge-bathing 1x/week over recent weeks due to fear of increasing weakness and risk of fall    Prior Level of Function:  Mobility: Household ambulation  Mobility:  Modified independence  with  Front wheeled walker  Fall History: Denies recent falls within past 6 months    Activities of Daily Living:  Patient was independent with all ADLs "as best as I'm able to do"  [place] ~1x/weekly. Does not wear socks, dons slip-on slippers only    DME available at home:  Front Wheeled Walker     Adult  Rollator (4 wheeled walker)  Single point cane  Hospital Bed  Oxygen - home    Instrumental Activities of Daily Living  Patient was dependent for all IADLs at baseline.    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    Leisure  Reportedly leads a largely sedentary lifestyle, watches TV and rests in recliner chair to keep LEs elevated           Subjective:   "I would put that all away." re: clutter on counter     Patient/caregiver goal(s)/priorities and concerns for OT: home with supervision    Assessment of Occupational Performance:   Pain:  At Rest: 0/10  With Activity: 0/10  Location: N/A  Interventions: None required       Skin inspection:  Discoloration of skin secondary to vascular insufficiency, dressing on right LE    Posture:  Forward Head:  seated and standing, Rounded Shoulders:  seated and standing, increased Thoracic  Kyphosis    Musculoskeletal Examination:   Range of motion:  Bilateral UE: WFL except SH-flex limited ~50% of full range with compensatory movement in scaption       Strength:  Right UE: Grossly 3/5  Left UE: Grossly 3/5    Tone:  Not applicable       Activities of Daily Living:   Eating:  Independent;   Grooming: Supervision/Set up, standing at sink  UB dressing:  Supervision/Set up  LB dressing: Minimal assist, to use dressing stick to thread LE's, otherwise supervision/setup for LB dressing, does not wear socks, uses slippers  Bathing:  Supervision/Set up, seated/standing;  Toileting:  Supervision/Set up;    Functional Mobility:  Transfers:  Sit to Stand:  Supervision with Front wheeled walker.        Stand to Sit:  Supervision.        Chair: Supervision with Front wheeled walker.        Toilet: Supervision with Front wheeled walker.        Functional mobility/ambulation: Supervision with Front wheeled walker    Neurology:   Cognition:   Oriented to: Oriented x4  Command following: Follows 1 step commands with repetition  Alertness/Arousal: Appropriate responses to stimuli   Attention Span:Attends to task with redirection  Memory: Decreased short term memory  Safety Awareness: minimal verbal instruction  Insights: Educated in safety awareness  Problem Solving: Assistance required to generate solutions, Assistance required to implement solutions, minimal assistance                Behavior:  Cooperative    Auditory/Oculomotor Examination:  Auditory:  impaired:  bilaterally, hearing aide  bilaterally and but states that they are not currently working properly and are at home  Visual Acuity:  wears glasses, bifocals      Motor Planning/Coordination:      Motor Planning:  Intact  Coordination:  Within Normal Limits (WNL)      Sensation:     intact     Balance:   Static Sitting:  Good  Dynamic Sitting:  Good  Static Standing:  Fair+  Dynamic Standing:  Fair          Participation and Activity Tolerance    Participation effort: Good  Activity Tolerance: Tolerates 30 minutes of activity with rest breaks, Limited by anxiety at times    Education Provided:   TOPICS: role of occupational therapy, plan of care, goals of therapy and safety with mobility and ADLs, benefits of activity, energy conservation techniques, breathing strategies, discharge instructions, home safety    Learner educated: Patient  Method: Explanation and Demonstration  Response to education: Verbalized understanding and Demonstrated understanding    Team Communication:   Interdisciplinary Communication has been provided through daily rounds, weekly meetings and whiteboard communication.  OT/COTA communication: via written note and verbal communication as needed.    Assessment:    [name] is a 79 y.o. female admitted 05/11/2019 presenting with further IV antibiotic, nursing and rehabilitative needs due to CHF exacerbation complicated by RLE ulceration with cellulitis requiring surgical consultation for potential debridement, per review of H&P.     Pt met all of her STG's.  She will be going home with supervision assist and recommend HHOT upon discharge to assist with transition.  She was educated on the recommendation to have someone check her medications in her pill box prior to taking them.  Pt reports that HHC has been recommended for this.  She did very well identifying unsafe situations in the kitchen and performed kitchen mobility with supervision using her FWW.  She requires cues as well for attention and safety with use of her O2 line.  OT has had no interaction with family so LTG's are not met at this time.    Goals:   STGs: Within 1 Week:  1. Pt will participate in bathing/dressing ADLs with supervision with AE/AT prnin order to demonstrate progression toward necessary level of functional (I) required to safely return home to (I)-living. = met  2. Pt will participate in item retrieval with supervisionwith LRAD in order to  demonstrate progression toward improved safety and (I) with household mobility to ensure safe and successful transition to home environment. = Met  3. Pt will identify > than or = to 75% of mock hazards during home safety assessment in  order to ensure pt with appropriate level of safety awareness necessary for (I)-living. = Met  4. Pt will participate in BUE HEP with supervisionin order to increase strength, coordination and activity tolerance necessary for improved participation in ADLs and transfers/mobility. = Met    LTGs: Within 2 Weeks:  1. Pt will be mod-(I) with ADLs, light IADLs (e.g., item retrieval, simple meal prep), bathroom transfers and household mobility with DME/AE/AT prn in order to maximize pt's functional independence and safety prior to discharge to ensure safe and successful transition to independent living. = Not met, currently supervision level  2. Pt/family will demonstrate good understanding of discharge needs and recommendations (e.g., DME, AE, AT, safety techniques, fall prevention, recommendation for HHOT prn, etc) to ensure safe and successful transition from hospital to home environment. = Not Met       Plan of Care:   Patient is being discharged to home with supervision and recommendations for [facility] and HHOT, she has her DME needs met.    DISCHARGE RECOMMENDATIONS   DME recommended for Discharge:   None  Has needed equipment    Discharge Recommendations:   Home Health  -24/7 Supervision for safety and/or physical assistance at Indirect level = caregiver present/on-site and checks on patient for safety and/or physical assistance.  * Medication management: Assist for Organization of medications  * Assist with the following ADLs:  Bathing  * Assist with the following IADLs:   Meal prep, Laundry, Grocery shopping, House cleaning  * Assist with ambulation within community  * Assist with stair management    [employer], OTR/L

## 2019-05-20 NOTE — Progress Notes (Signed)
Patient is sitting up in chair eating lunch with legs elevated. No complaints or needs expressed at this time. Call bell in reach. Will continue to monitor. Patient did have a nose bleed in the L nostril which we were able to stop by putting lubricating jelly in the nostril.

## 2019-05-20 NOTE — Plan of Care (Signed)
See OT discharge for details.

## 2019-05-20 NOTE — Progress Notes (Signed)
CM spoke with patients daughter, Elnita Maxwell regarding d/c plans for tomorrow. She verbalized understanding and states that her husband will pick her up tomorrow at 2.

## 2019-05-20 NOTE — Progress Notes (Signed)
Nutrition Therapy  Nutrition Follow Up    Patient Information:     Name:[name]   Age: 79 y.o.   Sex: female     MRN: [id number]      Recommendation:     1. Pt to continue with diet plan/cardiac    Nutrition Assessment:     Pt continues with appropriate PO intake >75%; wt seems stable. Pt may be discharged on 1/20.        Nutrition Risk Level: Low      Nutrition Diagnosis:     None at this time     Monitoring:  Evaluation:    PO/EN/PN intake:  Total energy intake   Labs:  Electrolyte Profile, Renal Profile and Glucose, casual    GI Profile:  Bowel Function   Nutrition Focused Physical:  Overall appearance       Assessment Data:     Height:     Weight: 78.2 kg (172 lb 6.4 oz)   BMI: Body mass index is 29.59 kg/m.   IBW:     Pertinent Meds:  As reviewed  Pertinent Labs:  Recent Labs   Lab 05/17/19  0510 05/16/19  0505   Creatinine 1.10 0.92        Diet Order:  Orders Placed This Encounter   Procedures   . Diet cardiac Na restriction, if any: 2 GM NA        GI symptoms:    +BM 1/18  Hydration:   I/O:  No intake/output data recorded.  No intake/output data recorded.      Skin:   Braden scale 19, nutrition 4      Estimated Needs:  Estimated Energy Needs  Total Energy Estimated Needs: 1363 - 1540 kcals/d  Method for Estimating Needs: 25 - 28 kcals/kg IBW 55 kg  Estimated Protein Needs  Total Protein Estimated Needs: 55 - 66 gm/d  Method for Estimating Needs: 1.0 - 1.2 gm/kg IBW 55 kg)       Additional Comments:       Leilani Merl, RD  05/20/2019 10:03 AM

## 2019-05-20 NOTE — PT Plan of Care Note (Signed)
[facility]    [facility]  [address]  [place], [place] [address]    Department of Rehabilitation  [phone]    Ceona Contorno [facility] CSN: [id number]  @[email or web address] @ 217/217-A    Physical Therapy General Note  Time: 9:20am    Last seen by a Physical Therapist: 05/19/19    Attempted to see pt for PT tx however pt currently having a nose bleed. Pt reported she "had a bad one last night." Will attempt PT tx again later today as schedule allows or next working day.     Communication: ("X" All that apply)       RN Salome Holmes        PA/NP        MD        PT/OT/SLP        Patient        Family/caregiver        Other (specify)         Alvino Chapel, DPT

## 2019-05-21 DIAGNOSIS — J9611 Chronic respiratory failure with hypoxia: Secondary | ICD-10-CM

## 2019-05-21 DIAGNOSIS — I4821 Permanent atrial fibrillation: Secondary | ICD-10-CM

## 2019-05-21 DIAGNOSIS — N1831 Chronic kidney disease, stage 3a: Secondary | ICD-10-CM

## 2019-05-21 DIAGNOSIS — I5033 Acute on chronic diastolic (congestive) heart failure: Secondary | ICD-10-CM

## 2019-05-21 DIAGNOSIS — I517 Cardiomegaly: Secondary | ICD-10-CM

## 2019-05-21 DIAGNOSIS — Z950 Presence of cardiac pacemaker: Secondary | ICD-10-CM

## 2019-05-21 DIAGNOSIS — I272 Pulmonary hypertension, unspecified: Secondary | ICD-10-CM

## 2019-05-21 LAB — PT/INR
PT INR: 2.6 — ABNORMAL HIGH (ref 0.9–1.2)
PT: 26.6 s — ABNORMAL HIGH (ref 9.4–11.3)

## 2019-05-21 MED ORDER — METOPROLOL SUCCINATE ER 50 MG PO TB24
50.00 mg | ORAL_TABLET | Freq: Every day | ORAL | 11 refills | Status: DC
Start: 2019-05-21 — End: 2019-07-10

## 2019-05-21 MED ORDER — METOLAZONE 2.5 MG PO TABS
2.50 mg | ORAL_TABLET | Freq: Every day | ORAL | 0 refills | Status: AC | PRN
Start: 2019-05-21 — End: ?

## 2019-05-21 MED ORDER — PREDNISONE 10 MG PO TABS
10.0000 mg | ORAL_TABLET | Freq: Every morning | ORAL | Status: DC
Start: 2019-05-22 — End: 2019-07-10

## 2019-05-21 NOTE — Progress Notes (Signed)
Updated AVS with home health information.

## 2019-05-21 NOTE — Progress Notes (Signed)
Patient discharging home today with [facility] SN/PT/OT. Family to provide transportation.        05/21/19 1350   Discharge Disposition   Patient preference/choice provided? Yes   Physical Discharge Disposition Home, Home Health   Name of Home Health Agency Placement [facility]   Name of DME Agency [facility] [place]   Mode of Transportation Car   Patient/Family/POA notified of transfer plan Yes   Patient agreeable to discharge plan/expected d/c date? Yes   Family/POA agreeable to discharge plan/expected d/c date? Yes   Special requirements for patient during transport: Oxygen   Outpatient Services   Home Health Skilled Nursing;Home PT/OT/ST   CM Interventions   Follow up appointment scheduled? Yes  (Dr. Amada Jupiter 06/11/19 at 1:30)   Referral made for home health RN visit? Yes   Multidisciplinary rounds/family meeting before d/c? Yes   Medicare Checklist   Is this a Medicare patient? Yes   Patient received 1st IMM Letter? Yes   3 midnight inpatient qualifying stay (SNF only) Yes   If LOS 3 days or greater, did patient received 2nd IMM Letter? Yes

## 2019-05-21 NOTE — Discharge Instr - AVS First Page (Addendum)
[facility] PT/OT and a nurse has been ordered for you. Anticipated Start of Care 05-22-2019. If you have not heard from them by 05/22/19, please call them at [phone].       Wound Care: Wash with normal saline 0.9%; Pat dry; Wet to dry dressing using gauze.

## 2019-05-21 NOTE — PT Progress Note (Signed)
[facility]  Department of Rehabilitation Services  [phone]    [facility]     Physical Therapy Transitional Care Discharge Note    [name] [facility]    CSN: [id number]    MEDICAL/SURGICAL   217/217-A    Date:  05/21/19                                                                           Precautions and Contraindications:   NONE    History of Present Illness:   Medical Diagnosis: Acute systolic (congestive) heart failure [I50.21]  Acute on chronic diastolic (congestive) heart failure [I50.33]  Diastolic CHF, acute on chronic [I50.33]    Therapy Diagnosis:  Gait difficulty, muscle weakness    [name] is a 79 y.o. female admitted on 05/11/2019 with need for continued IV antibiotics, wound care and rehab following hospitalization for CHF exacerbation and RLE cellulitis.     Patient Active Problem List   Diagnosis    CHF (congestive heart failure)    Permanent atrial fibrillation    Hyperkalemia    Chronic diastolic CHF (congestive heart failure)    On home oxygen therapy    Aortic regurgitation    Pacemaker    Pacemaker at end of battery life    Diaphragmatic eventration    Chronic interstitial lung disease    Thoracic aortic aneurysm without rupture    Difficulty breathing    Chronic respiratory failure with hypoxia, on home O2 therapy    Nonobstructive atherosclerosis of coronary artery    SSS (sick sinus syndrome)    Drug-induced constipation    Moderate asthma with acute exacerbation    Pulmonary HTN    Chronic venous insufficiency    Bilateral lower extremity edema    Diastolic CHF, acute on chronic    Venous stasis ulcer of right ankle with fat layer exposed without varicose veins    Chronic kidney disease (CKD), active medical management without dialysis, stage 3 (moderate)    Cellulitis of right ankle    Hyponatremia        Past Medical/Surgical History:  Past Medical History:   Diagnosis Date    Aortic aneurysm     Atrial fibrillation      Chronic obstructive pulmonary disease     Coronary artery disease     Gastroesophageal reflux disease     Hypertension     Malignant neoplasm     Pacemaker     SSS (sick sinus syndrome)       Past Surgical History:   Procedure Laterality Date    CARDIAC PACEMAKER PLACEMENT      2002    HYSTERECTOMY           Social History:   Home Living Arrangements:  Living Arrangements:Family members(dtr and son-in-law in basement)  Type of Home:House  Home Layout:Ramped entrance, Able to live on main level with bedroom and bathroom    DME available at home:  Assistive devices:Front wheeled walker  Adaptive equipment:unknown   Home O2- 2L    Prior Level of Function:  Household ambulationwith FWW, Mod-Independent    Subjective   "I'm ready to go home"    Examination of Body Systems (Structures, Function,  Activity and Participation)   Pain:  With Activity: Minimal to moderate, rated 4/10 in recent session   Location: Leg:  right  Interventions: Rest/Elevation in pillows, See MAR     Edema/Skin inspection:  Edema:  No pedal edema noted      Skin inspection:  Wound covered in gauze wrap. BLE skin darkened with standing and walking (Hx of venous insufficiency)    Posture:  Forward Head:  seated and standing, Rounded Shoulders:  seated    Musculoskeletal Examination:   Range of motion:  Grossly WFL    Strength:  Grossly WFL    Functional Mobility:  Bed Mobility:  Rolling to Left:  Modified independence .   HOB elevated, Bed rail used  Supine to Sit:   Supervision.   HOB elevated, Bed rail used    Transfers:  Sit <> Stand:  MOD I  When turning to sit after walking, occasional cueing is needed to maximize safety/proper management of o2 tubing     Locomotion:  LEVEL AMBULATION:  Distance: 120'   Assistance level:  Supervision only for occasional monitoring of O2 tubing, otherwise MOD I  Device:  Front wheeled walker  Pattern: Step through, Reciprocal, Steady       Neurology:   Cognition:   Command following: Follows 1  step commands without difficulty, Follows multi-step commands with repetition         Balance:   Static Sitting:  Good  Dynamic Sitting:  Good  Static Standing:  Good  Dynamic Standing:  Fair - Requires BUE support on FWW               Participation and Activity Tolerance   Participation effort: Good  Activity Tolerance: Tolerates 30 minutes of activity with rest breaks       Education Provided:   TOPICS:  plan of care, goals of therapy/goals met and benefits of activity, Safety with walker during ambulation/transfers and oxygen tubing management during all functional mobility     Learner educated: Patient  Method: Explanation  Response to education: Verbalized understanding, Demonstrated understanding, Will benefit from continued review of O2 management in home setting by home health PT     Team Communication:   Interdisciplinary Communication has been provided through daily rounds, weekly meetings and whiteboard communication.  PT/PTA communication: via written note and verbal communication as needed.    Assessment:    [name] was admitted 05/11/2019 with need for continued IV antibiotics, wound care and rehab following hospitalization for CHF exacerbation and RLE cellulitis. Pt has made good progress via skilled PT interventions evidenced by increased (I) and tolerance during all functional mobility. Pt is now able to (I) ambulate with FWW and navigate obstacles, she required occasional cueing to adjust her oxygen tubing when navigating obstacles to reduce fall risk (tripping on tubing). Pt (I) recognizes when her O2 sats decrease due to increased DOE and verbalizes need to take a break from ambulation to sit. Lowest SpO2 noted was 89% on 3L however with a seated break SpO2 increased back to the mid-90's. Pt mentioned at times she uses up to 4L at home when mobilizing. No unsteadiness or overt LOB occurred during amb or standing activities. Gait pattern much improved with pt now demonstrating a  reciprocal, step through pattern which has improved her efficiency greatly. Wound care by PT was discontinued- see nursing documentation for details/recommendations. At this time, pt has met all PT goals and appears to have regained her PLOF. Pt would benefit  from home health PT to facilitate a safe return to home setting and continue with education/instruction to safe management of O2 tubing during ambulation.        Goals:   1. Decrease wound dimensions by >/= 10% to facilitate healing and wound closure.DISCONTINUED 05/13/19- for further information see note from Claudie Leach, MD  2. Pt will perform supine <> sit with MOD I for increased (I) with bed mobility; MET  3. Pt will amb >100' using FWW with MOD I for increased (I) with household mobility; MET    Plan of Care:   Patient is being discharged home. D/C PT    DISCHARGE RECOMMENDATIONS   DME recommended for Discharge:   None  Has needed equipment    Discharge Recommendations:   Home  Home Health PT  Near 24/7 indirect supervision     Mayra Reel [facility], SPT  ______________________________________________________________________    I have reviewed and agree with the documentation authored by my student of the evaluation/ treatment/education/care plan delivered during my direct supervision.     Co-Signed by:  Alvino Chapel, PT  ______________________________________________________________________

## 2019-05-21 NOTE — Discharge Instr - Diet (Signed)
Cardiac diet; Sodium restriction

## 2019-05-21 NOTE — Progress Notes (Signed)
Anticoagulation Progress Report   May 20, 2019  Name: [name], [name] [facility]    Medical Record Number: [id number]  Reason for Treatment: Atrial fibrillation unspecified     INR Range: 2.0-3.0    Date: 2019-05-20 09:24  Location: [facility]    Update Note: Inpatient at [facility] and INR is 2.3 today per EPIC. I will monitor   patient for discharge.     Submitted by Kennyth Arnold RN

## 2019-05-21 NOTE — Discharge Instr - Activity (Signed)
Activity as tolerated

## 2019-05-23 ENCOUNTER — Other Ambulatory Visit (RURAL_HEALTH_CENTER): Payer: Self-pay | Admitting: Internal Medicine

## 2019-05-23 ENCOUNTER — Telehealth (RURAL_HEALTH_CENTER): Payer: Self-pay

## 2019-05-23 MED ORDER — HYDROCODONE-ACETAMINOPHEN 5-325 MG PO TABS
1.0000 | ORAL_TABLET | Freq: Four times a day (QID) | ORAL | 0 refills | Status: DC | PRN
Start: 2019-05-23 — End: 2019-06-11

## 2019-05-23 NOTE — Telephone Encounter (Signed)
Noted and patients daughter is aware -

## 2019-05-23 NOTE — Telephone Encounter (Signed)
Her coumadin is fine.  Follow with the clinic.     I renewed the hydrocodone. Makes me wonder who was taking it while she was in hospital??    The last 3 meds are fine.

## 2019-05-23 NOTE — Discharge Summary (Signed)
Medicine Discharge Summary   [facility]  [facility]   Patient Name: [name]   Attending Physician: No att. providers found PCP: Graciela Husbands, DO   Date of Admission: 05/11/2019 D/C Date: 05/23/2019   Discharge Diagnoses:     Cellulitis of right ankle with venous stasis ulcers.  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 female patient that was admitted on 05/11/2019 she presented originally to the hospital because of lower extremity edema and dyspnea.  She had a worsening of her diastolic heart failure.  On admission she was noted to have very painful ulcerations on the medial aspect of the right lower extremity around the ankle indicative of venous stasis ulcerations.  They looked infected on admission.  She was placed on intravenous antibiotics.  Wound care was consulted and later general surgery was consulted.  With compression dressings, antibiotics, and elevation, the edema in the legs went away and some of the pain subsided.  She did need oxycodone for pain management.  The patient was placed in transitional care for further antibiotics and for PT/OT.  We felt she could go home on 1/20 in good condition.  She is much improved at the time of discharge however she is one who has very difficult pathophysiology and it is likely that at home she will again slip into this syndrome of lower extremity edema and breathlessness.  I have seen it happen over and over.  She has a massive heart with significant pulmonary hypertension and chronic right ventricular failure that makes  management of this very difficult.  We do what we can with home care, sodium and fluid restriction, and medical management but she is difficult to keep out of the hospital.  There was some discussion with her daughters during the hospitalization about placing her in hospice at discharge but she made enough improvement that we did not feel the death was eminent and so we backed off on that idea.    Active Problems:    Permanent atrial fibrillation    On home oxygen therapy    Pacemaker    Pulmonary HTN    Bilateral lower extremity edema    Diastolic CHF, acute on chronic    Venous stasis ulcer of right ankle with fat layer exposed without varicose veins    Chronic kidney disease (CKD), active medical management without dialysis, stage 3 (moderate)    Cellulitis of right ankle    Hyponatremia  Resolved Problems:    * No resolved hospital problems. *     Pending Results and other significant studies:  None     Discharge Instructions:          Disposition: Home with Home Health  Diet: Regular Diet , Low sodium and fluid restriction (30-40 oz/ day)  Activity: As tolerated  Discharge Code Status: Prior    Graciela Husbands, DO  [address]  [place] [address]  [phone]             Discharge Medications:  Discharge Medication List      Taking    albuterol (2.5 MG/3ML) 0.083% nebulizer solution  Dose: 2.5 mg  Commonly known as: PROVENTIL  Take 2.5 mg by nebulization every 6 (six) hours as needed for Wheezing.     allopurinol 100 MG tablet  Dose: 100 mg  Commonly known as: ZYLOPRIM  Take 1 tablet (100 mg total) by mouth daily For gout prevention     dilTIAZem 240 MG 24 hr capsule  Dose: 240 mg  Commonly known as: TIAZAC  Take 240 mg by mouth 2 (two) times daily.      fluorometholone 0.1 % ophthalmic suspension  Dose: 1 drop  Commonly known as: EFLONE  Place 1 drop into the left eye 4 (four) times daily   Notes to patient: Use every 6 hours on left hour      fluticasone furoate-vilanterol 100-25 MCG/INH Aepb  Dose: 1 puff  Commonly known as: BREO ELLIPTA  Inhale 1 puff into the lungs daily     furosemide 40 MG tablet  Dose: 40 mg  Commonly known as: LASIX  Take 1 tablet (40 mg total) by mouth 2 (two) times daily     latanoprost 0.005 % ophthalmic solution  Dose: 1 drop  Commonly known as: XALATAN  Place 1 drop into both eyes nightly      metOLazone 2.5 MG tablet  Dose: 2.5 mg  What changed:    when to take this   reasons to take this  Commonly known as: ZAROXOLYN  Take 1 tablet (2.5 mg total) by mouth daily as needed (if you gain 5 pounds)     metoprolol succinate XL 50 MG 24 hr tablet  Dose: 50 mg  Commonly known as: Toprol XL  Take 1 tablet (50 mg total) by mouth daily     potassium chloride 10 MEQ tablet  Dose: 10 mEq  Commonly known as: K-DUR  Take 1 tablet (10 mEq total) by mouth daily     predniSONE 10 MG tablet  Dose: 10 mg  What changed:    how much to take   how to take this   when to take this   additional instructions  Commonly known as: DELTASONE  Take 1 tablet (10 mg total) by mouth every morning with breakfast     rosuvastatin 10 MG tablet  Dose: 10 mg  Commonly known as: CRESTOR  Take 1 tablet (10 mg total) by mouth daily     spironolactone 25 MG tablet  Dose: 25 mg  Commonly known as: ALDACTONE  Take 1 tablet (25 mg total) by mouth daily     tiotropium 2.5 MCG/ACT inhalation spray  Dose: 2 puff  Commonly known as: SPIRIVA RESPIMAT  Inhale 2 puffs into the lungs daily     warfarin 3 MG tablet  Dose: 3 mg  What changed:    how much to take   additional instructions  Commonly known as: COUMADIN  Take 1 tablet (3 mg total) by mouth daily.        STOP taking these medications    atenolol 50 MG tablet  Commonly known as: TENORMIN     fluorometholone 0.1 % ophthalmic suspension  Commonly known as: FML     ibuprofen 400 MG tablet  Commonly known as: ADVIL             Discharge Day Exam (05/23/2019):      Recent Labs      Recent Labs   Lab 05/17/19  0510    WBC 7.3   RBC 3.91   Hemoglobin 10.9*   Hematocrit 35.3*   MCV 90   PLT CT 172     Recent Labs   Lab 05/21/19  0610 05/20/19  0615 05/19/19  0610 05/18/19  0510 05/17/19  0510   PT 26.6* 24.2* 19.7* 15.1* 15.0*   PT INR 2.6* 2.3* 1.9* 1.4* 1.4*         No results found for: [id number]  Recent Labs   Lab 05/17/19  0510   Creatinine 1.10   EGFR 48*            Allergies:      Adhesive [wound dressing adhesive], Codeine, Fentanyl, Morphine, Morphine and related, Nitroglycerin, Pain patch [menthol], and Sulfa antibiotics   Time spent on discharging the patient:  30 minutes   Ct Chest Wo Contrast    Result Date: 05/17/2019  There is cardiomegaly with new small pleural effusions. The patient may have mild congestive heart failure. There is a small chronic pericardial effusion. There is new mild hazy density in the lungs and interlobular septal thickening. 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 There is chronic cardiomegaly and a pacemaker. Patient motion obscures the lung markings, and I cannot accurately evaluate for interstitial lung disease. For full details, please see the above report. ReadingStation:[email or web address]     Home Health Needs:    Date of face-to-face (FTF) encounter:    05/21/2019     Medical conditions that necessitate Home Health care:    B.  Functional impairment due to recent hospitalization/procedure/treatment     Clinical findings that support the need for Skilled Nursing. SN will:    K. Educate on complex wound care and management     Clinical findings that support the need for Physical Therapy. PT will:    A.  Evaluate and treat functional impairment and improve mobility     Clinical findings that support the need for SLP. ST will:    F.  N/A     Evidence this patient is homebound because:    B.  Profound weakness, poor balance/unsteady gait d/t illness/treatment/procedure     Per clinical findings, following services are medically necessary:    Skilled Nursing    PT    OT     Clinical  findings support the need for OT (needs SN/PT order).OT will:    A.  Develop in home program to improve ability to perform ADLs      Graciela Husbands, DO         05/23/19 10:43 AM   MRN: [id number]                                      CSN: [id number] DOB: 26-Apr-1941

## 2019-05-23 NOTE — Telephone Encounter (Signed)
Lexine Baton calling and states patient was just discharged from the hospital they sent her home on 3mg  Warfarin daily, patient only has 5mg  tablets and she has been cutting those 1/2 for 2.5mg  daily, is this okay for the patient to continue doing or could some 1 mg tablets be sent into the pharmacy if she should be taking 3 mg daily.     Patient needs a refill on the Hydrocodone 5-325 mg  ( 1 tablet every 6 hours) she was sent home with. Misty Stanley is also asking that orders be put in for home health to do all the patients INR testing at home when they do their visits with her per patient request.     Patient is currently taking tums, omeprazole, and vitamin c that is not on her current med list. Should she be taking these or not?

## 2019-05-23 NOTE — Telephone Encounter (Signed)
Pt called into requesting refill on Hydrocodone. Pt states she will be out this weekend and is questioning if this could be sent in today as she was just discharged from the Hospital this week. [facility] to [facility]

## 2019-05-23 NOTE — Telephone Encounter (Signed)
Last order date was - 04/17/2019  QTY - 90   Refill - 0    Take 1 tablet by mouth every 6 (six) hours as needed for Pain

## 2019-05-26 NOTE — Progress Notes (Signed)
Anticoagulation Progress Report   May 26, 2019  Name: [name], [name] [facility]    Medical Record Number: [id number]  Reason for Treatment: Atrial fibrillation unspecified     INR Range: 2.0-3.0    Date: 2019-05-26 14:26  Location: [facility]    INR: 2.10    Tablet Strength: 5 mg    Next INR: 7 days 2019-06-02    Progress Note: Larene Beach [facility] nurse called with INR result. States patient   has only been taking 2.5 mg daily as this is the dose the hospital put her   on. Will leave for now, and retest next Monday, will adjust at that time if   needed.     Signature Note: Assessment by  Janann Colonel RN; dosage by  Janann Colonel RN    Dosage Out: :Su-2.5mg :M-2.5mg :T-2.5mg :W-2.5mg :Th-2.5mg :F-2.5mg :Sa-2.5mg :

## 2019-05-26 NOTE — Progress Notes (Signed)
Anticoagulation Progress Report   May 26, 2019  Name: [name], [name] [facility]    Medical Record Number: [id number]  Reason for Treatment: Atrial fibrillation unspecified     INR Range: 2.0-3.0    Date: 2019-05-26 14:22  Location: [facility]    Update Note: Lynden Ang [facility] nurse called in regards to testing INR. Patient is   home. Verbal order given to test INR today while in home.     Submitted by Janann Colonel RN

## 2019-06-03 NOTE — Progress Notes (Signed)
Anticoagulation Progress Report   June 03, 2019  Name: [name], [name] [facility]    Medical Record Number: [id number]  Reason for Treatment: Atrial fibrillation unspecified     INR Range: 2.0-3.0    Date: 2019-06-03 09:38  Location: [facility]    INR: 2.80    Tablet Strength: 5 mg    Next INR: 7 days 2019-06-10    Progress Note: [name] [facility] nurse called with result. Reviewed current   dose and schedule.     Signature Note: Assessment by  [name] RN; dosage by  [name] RN    Dosage Out: :Su-2.5mg :M-2.5mg :T-2.5mg :W-2.5mg :Th-2.5mg :F-2.5mg :Sa-2.5mg :

## 2019-06-09 NOTE — Progress Notes (Signed)
Anticoagulation Progress Report   June 09, 2019  Name: [name], WINEBRENNER [facility]    Medical Record Number: [id number]  Reason for Treatment: Atrial fibrillation unspecified     INR Range: 2.0-3.0    Date: 2019-06-09 13:49  Location: [facility]    INR: 3.00    Tablet Strength: 5 mg    Next INR: 7 days 2019-06-16    Progress Note: INR results per Denyse Amass, HHN. Reviewed patient's current   dosing. HH will recheck her INR in 1 week.     Signature Note: Assessment by  Lowanda Foster RN; dosage by  Lowanda Foster RN    Dosage Out: :Su-2.5mg :M-2.5mg :T-2.5mg :W-2.5mg :Th-2.5mg :F-2.5mg :Sa-2.5mg :

## 2019-06-09 NOTE — Progress Notes (Signed)
Anticoagulation Progress Report   June 09, 2019  Name: [name], [name] [facility]    Medical Record Number: [id number]  Reason for Treatment: Atrial fibrillation unspecified     INR Range: 2.0-3.0    Date: 2019-06-09 13:40  Location: [facility]    Update Note: Chip Boer from [facility] going to see pt today and do INR    Submitted by Laural Benes AA

## 2019-06-11 ENCOUNTER — Encounter (RURAL_HEALTH_CENTER): Payer: Self-pay | Admitting: Internal Medicine

## 2019-06-11 ENCOUNTER — Ambulatory Visit: Payer: Medicare Other | Attending: Internal Medicine | Admitting: Internal Medicine

## 2019-06-11 ENCOUNTER — Other Ambulatory Visit (RURAL_HEALTH_CENTER): Payer: Self-pay

## 2019-06-11 VITALS — BP 104/59 | HR 61 | Temp 98.3°F | Ht 64.0 in | Wt 170.2 lb

## 2019-06-11 DIAGNOSIS — I272 Pulmonary hypertension, unspecified: Secondary | ICD-10-CM

## 2019-06-11 DIAGNOSIS — E871 Hypo-osmolality and hyponatremia: Secondary | ICD-10-CM

## 2019-06-11 DIAGNOSIS — J849 Interstitial pulmonary disease, unspecified: Secondary | ICD-10-CM

## 2019-06-11 DIAGNOSIS — I5032 Chronic diastolic (congestive) heart failure: Secondary | ICD-10-CM

## 2019-06-11 DIAGNOSIS — I4821 Permanent atrial fibrillation: Secondary | ICD-10-CM

## 2019-06-11 DIAGNOSIS — Z9981 Dependence on supplemental oxygen: Secondary | ICD-10-CM

## 2019-06-11 DIAGNOSIS — E875 Hyperkalemia: Secondary | ICD-10-CM

## 2019-06-11 MED ORDER — HYDROCODONE-ACETAMINOPHEN 5-325 MG PO TABS
1.0000 | ORAL_TABLET | Freq: Four times a day (QID) | ORAL | 0 refills | Status: DC | PRN
Start: 2019-06-11 — End: 2019-07-17

## 2019-06-11 NOTE — Progress Notes (Signed)
Cellulitis of right ankle with venous stasis ulcers.  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 female patient that was admitted on 05/11/2019 she presented originally to the hospital because of lower extremity edema and dyspnea.  She had a worsening of her diastolic heart failure.  On admission she was noted to have very painful ulcerations on the medial aspect of the right lower extremity around the ankle indicative of venous stasis ulcerations.  They looked infected on admission.  She was placed on intravenous antibiotics.  Wound care was consulted and later general surgery was consulted.  With compression dressings, antibiotics, and elevation, the edema in the legs went away and some of the pain subsided.  She did need oxycodone for pain management.  The patient was placed in transitional care for further antibiotics and for PT/OT.  We felt she could go home on 1/20 in good condition.  She is much improved at the time of discharge however she is one who has very difficult pathophysiology and it is likely that at home she will again slip into this syndrome of lower extremity edema and breathlessness.  I have seen it happen over and over.  She has a massive heart with significant pulmonary hypertension and chronic right ventricular failure that makes management of this very difficult.  We do what we can with home care, sodium and fluid restriction, and medical management but she is difficult to keep out of the hospital.  There was some discussion with her daughters during the hospitalization about placing her in hospice at discharge but she made enough  improvement that we did not feel the death was eminent and so we backed off on that idea.  Active Problems:    Permanent atrial fibrillation    On home oxygen therapy    Pacemaker    Pulmonary HTN    Bilateral lower extremity edema    Diastolic CHF, acute on chronic    Venous stasis ulcer of right ankle with fat layer exposed without varicose veins    Chronic kidney disease (CKD), active medical management without dialysis, stage 3 (moderate)    Cellulitis of right ankle    Hyponatremia    Current/Home Medications    ALBUTEROL (PROVENTIL) (2.5 MG/3ML) 0.083% NEBULIZER SOLUTION    Take 2.5 mg by nebulization every 6 (six) hours as needed for Wheezing.    ALLOPURINOL (ZYLOPRIM) 100 MG TABLET    Take 1 tablet (100 mg total) by mouth daily For gout prevention    DILTIAZEM (TIAZAC) 240 MG 24 HR CAPSULE    Take 240 mg by mouth 2 (two) times daily.       FLUOROMETHOLONE (EFLONE) 0.1 % OPHTHALMIC SUSPENSION    Place 1 drop into the left eye 4 (four) times daily       FLUTICASONE FUROATE-VILANTEROL (BREO ELLIPTA) 100-25 MCG/INH AEROSOL PWDR, BREATH ACTIVATED    Inhale 1 puff into the lungs daily    FUROSEMIDE (LASIX) 40 MG TABLET    Take 1 tablet (40 mg total) by mouth 2 (two) times daily    HYDROCODONE-ACETAMINOPHEN (NORCO) 5-325 MG PER TABLET    Take 1 tablet by mouth every  6 (six) hours as needed for Pain    LATANOPROST (XALATAN) 0.005 % OPHTHALMIC SOLUTION    Place 1 drop into both eyes nightly       METOLAZONE (ZAROXOLYN) 2.5 MG TABLET    Take 1 tablet (2.5 mg total) by mouth daily as needed (if you gain 5 pounds)    METOPROLOL SUCCINATE XL (TOPROL XL) 50 MG 24 HR TABLET    Take 1 tablet (50 mg total) by mouth daily    POTASSIUM CHLORIDE (K-DUR) 10 MEQ TABLET    Take 1 tablet (10 mEq total) by mouth daily    PREDNISONE (DELTASONE) 10 MG TABLET    Take 1 tablet (10 mg total) by mouth every morning with breakfast    ROSUVASTATIN (CRESTOR) 10 MG TABLET    Take 1 tablet (10 mg total) by mouth daily    SPIRONOLACTONE  (ALDACTONE) 25 MG TABLET    Take 1 tablet (25 mg total) by mouth daily    TIOTROPIUM (SPIRIVA RESPIMAT) 2.5 MCG/ACT INHALATION SPRAY    Inhale 2 puffs into the lungs daily    WARFARIN (COUMADIN) 3 MG TABLET    Take 1 tablet (3 mg total) by mouth daily.   Review of Systems   Constitutional: Positive for malaise/fatigue. Negative for chills, fever and weight loss.   HENT: Negative for hearing loss.    Respiratory: Positive for cough and shortness of breath. Negative for wheezing.    Cardiovascular: Positive for leg swelling. Negative for chest pain.   Gastrointestinal: Negative.    Genitourinary: Positive for frequency.   Musculoskeletal: Positive for back pain, joint pain and myalgias.   Skin: Negative.         Has an ulcer on the right leg around medial malleolus   Neurological: Negative for dizziness and weakness.   Endo/Heme/Allergies: Negative.    Psychiatric/Behavioral: Negative for depression and memory loss. The patient is not nervous/anxious.        Vitals:    06/11/19 1331   BP: 104/59   Pulse: 61   Temp: 98.3 F (36.8 C)   SpO2: (!) 89%          [place]coli ill-appearing woman on supplemental oxygen sitting in a wheelchair  She is wearing O2 at 3 L/min.  She is not short of breath in appearance  Cardiac rhythm is irregular indicative of atrial fibrillation.  S1-S2 are heard there are no murmurs.  Lungs are clear bilaterally.  No wheezes noted abdomen is soft and nontender.  She does have 2+ pretibial edema.  There is a round 2 cm ulceration noted above the medial malleolus of the right ankle.  This is chronic.  The wrapping was removed from this and it was redressed at the visit.    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  I am going to continue the Lasix twice per day and have her take metolazone 2.5 mg on Mondays Wednesdays and Fridays.  We will update a CMP.  We will see her in 3 months.    Electronically signed by Graciela Husbands D.O.

## 2019-06-11 NOTE — Patient Instructions (Addendum)
Start taking metolazone 2.5 mg on Monday Wednesday and Friday.    Be careful with your salt intake but you can have a few chicken livers now and then.  If you want steak fries, go ahead but do not eat too many.    Try to stay within 35 ounces of fluid per day.

## 2019-06-12 ENCOUNTER — Telehealth (RURAL_HEALTH_CENTER): Payer: Self-pay

## 2019-06-12 ENCOUNTER — Other Ambulatory Visit
Admission: RE | Admit: 2019-06-12 | Discharge: 2019-06-12 | Disposition: A | Discharge status: Home / Self Care | Payer: Medicare Other | Source: Ambulatory Visit | Attending: Internal Medicine | Admitting: Internal Medicine

## 2019-06-12 DIAGNOSIS — I5032 Chronic diastolic (congestive) heart failure: Secondary | ICD-10-CM

## 2019-06-12 DIAGNOSIS — J849 Interstitial pulmonary disease, unspecified: Secondary | ICD-10-CM

## 2019-06-12 DIAGNOSIS — E871 Hypo-osmolality and hyponatremia: Secondary | ICD-10-CM

## 2019-06-12 DIAGNOSIS — I4821 Permanent atrial fibrillation: Secondary | ICD-10-CM

## 2019-06-12 DIAGNOSIS — I272 Pulmonary hypertension, unspecified: Secondary | ICD-10-CM

## 2019-06-12 DIAGNOSIS — E875 Hyperkalemia: Secondary | ICD-10-CM

## 2019-06-12 DIAGNOSIS — Z9981 Dependence on supplemental oxygen: Secondary | ICD-10-CM

## 2019-06-12 LAB — COMPREHENSIVE METABOLIC PANEL
ALT: 24 U/L (ref 0–55)
AST (SGOT): 27 U/L (ref 10–42)
Albumin/Globulin Ratio: 2.05 Ratio — ABNORMAL HIGH (ref 0.80–2.00)
Albumin: 4.3 gm/dL (ref 3.5–5.0)
Alkaline Phosphatase: 138 U/L (ref 40–145)
Anion Gap: 16.9 mMol/L (ref 7.0–18.0)
BUN / Creatinine Ratio: 34.1 Ratio — ABNORMAL HIGH (ref 10.0–30.0)
BUN: 44 mg/dL — ABNORMAL HIGH (ref 7–22)
Bilirubin, Total: 1 mg/dL (ref 0.1–1.2)
CO2: 32 mMol/L — ABNORMAL HIGH (ref 20–30)
Calcium: 10.2 mg/dL (ref 8.5–10.5)
Chloride: 92 mMol/L — ABNORMAL LOW (ref 98–110)
Creatinine: 1.29 mg/dL — ABNORMAL HIGH (ref 0.60–1.20)
EGFR: 39 mL/min/{1.73_m2} — ABNORMAL LOW (ref 60–150)
Globulin: 2.1 gm/dL (ref 2.0–4.0)
Glucose: 123 mg/dL — ABNORMAL HIGH (ref 71–99)
Osmolality Calculated: 285 mOsm/kg (ref 275–300)
Potassium: 4.9 mMol/L (ref 3.5–5.3)
Protein, Total: 6.4 gm/dL (ref 6.0–8.3)
Sodium: 136 mMol/L (ref 136–147)

## 2019-06-12 LAB — CBC
Hematocrit: 34.7 % — ABNORMAL LOW (ref 36.0–48.0)
Hemoglobin: 10.8 gm/dL — ABNORMAL LOW (ref 12.0–16.0)
MCH: 29 pg (ref 28–35)
MCHC: 31 gm/dL — ABNORMAL LOW (ref 32–36)
MCV: 93 fL (ref 80–100)
MPV: 9.1 fL (ref 6.0–10.0)
PLT CT: 136 10*3/uL (ref 130–440)
RBC: 3.75 10*6/uL — ABNORMAL LOW (ref 3.80–5.00)
RDW: 16.1 % — ABNORMAL HIGH (ref 11.0–14.0)
WBC: 8.9 10*3/uL (ref 4.0–11.0)

## 2019-06-12 NOTE — Telephone Encounter (Signed)
Results and directions to pt....pkt

## 2019-06-12 NOTE — Telephone Encounter (Signed)
-----   Message from Graciela Husbands, DO sent at 06/12/2019  9:26 AM EST -----  Labs are fine. Make sure she adds the metolazone on M/W/F

## 2019-06-12 NOTE — Telephone Encounter (Signed)
Pt has been informed and verbalizes understanding to directions.Marland KitchenMarland KitchenMarland KitchenMarland Kitchenpkt

## 2019-06-12 NOTE — Telephone Encounter (Signed)
-----   Message from James Dale, DO sent at 06/12/2019  9:26 AM EST -----  Labs are fine. Make sure she adds the metolazone on M/W/F

## 2019-06-17 NOTE — Progress Notes (Signed)
Anticoagulation Progress Report   June 17, 2019  Name: [name], [name] [facility]    Medical Record Number: [id number]  Reason for Treatment: Atrial fibrillation unspecified     INR Range: 2.0-3.0    Date: 2019-06-17 09:33  Location: [facility]    INR: 2.50    Tablet Strength: 5 mg    Next INR: 7 days 2019-06-24    Progress Note: Lynden Ang [facility] nurse calls with INR result. Dose reviewed and   will retest next Tuesday.     Signature Note: Assessment by  Janann Colonel RN; dosage by  Janann Colonel RN    Dosage Out: :Su-2.5mg :M-2.5mg :T-2.5mg :W-2.5mg :Th-2.5mg :F-2.5mg :Sa-2.5mg :

## 2019-06-20 ENCOUNTER — Inpatient Hospital Stay
Admission: EM | Admit: 2019-06-20 | Discharge: 2019-06-24 | DRG: 603 | Disposition: A | Discharge status: Other Acute Inpatient Hospital | Payer: Medicare Other | Attending: Internal Medicine | Admitting: Internal Medicine

## 2019-06-20 ENCOUNTER — Telehealth (RURAL_HEALTH_CENTER): Payer: Self-pay | Admitting: Internal Medicine

## 2019-06-20 DIAGNOSIS — I130 Hypertensive heart and chronic kidney disease with heart failure and stage 1 through stage 4 chronic kidney disease, or unspecified chronic kidney disease: Secondary | ICD-10-CM | POA: Diagnosis present

## 2019-06-20 DIAGNOSIS — L97212 Non-pressure chronic ulcer of right calf with fat layer exposed: Secondary | ICD-10-CM | POA: Diagnosis present

## 2019-06-20 DIAGNOSIS — G4737 Central sleep apnea in conditions classified elsewhere: Secondary | ICD-10-CM | POA: Diagnosis present

## 2019-06-20 DIAGNOSIS — R52 Pain, unspecified: Secondary | ICD-10-CM | POA: Diagnosis present

## 2019-06-20 DIAGNOSIS — I5032 Chronic diastolic (congestive) heart failure: Secondary | ICD-10-CM | POA: Diagnosis present

## 2019-06-20 DIAGNOSIS — I4821 Permanent atrial fibrillation: Secondary | ICD-10-CM | POA: Diagnosis present

## 2019-06-20 DIAGNOSIS — L97912 Non-pressure chronic ulcer of unspecified part of right lower leg with fat layer exposed: Secondary | ICD-10-CM

## 2019-06-20 DIAGNOSIS — R791 Abnormal coagulation profile: Secondary | ICD-10-CM | POA: Diagnosis present

## 2019-06-20 DIAGNOSIS — Z79899 Other long term (current) drug therapy: Secondary | ICD-10-CM

## 2019-06-20 DIAGNOSIS — I719 Aortic aneurysm of unspecified site, without rupture: Secondary | ICD-10-CM | POA: Diagnosis present

## 2019-06-20 DIAGNOSIS — L97218 Non-pressure chronic ulcer of right calf with other specified severity: Secondary | ICD-10-CM

## 2019-06-20 DIAGNOSIS — Z7952 Long term (current) use of systemic steroids: Secondary | ICD-10-CM

## 2019-06-20 DIAGNOSIS — I83012 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: Secondary | ICD-10-CM | POA: Diagnosis present

## 2019-06-20 DIAGNOSIS — Z7951 Long term (current) use of inhaled steroids: Secondary | ICD-10-CM

## 2019-06-20 DIAGNOSIS — N1831 Chronic kidney disease, stage 3a: Secondary | ICD-10-CM | POA: Diagnosis present

## 2019-06-20 DIAGNOSIS — L03115 Cellulitis of right lower limb: Principal | ICD-10-CM | POA: Diagnosis present

## 2019-06-20 DIAGNOSIS — I2510 Atherosclerotic heart disease of native coronary artery without angina pectoris: Secondary | ICD-10-CM | POA: Diagnosis present

## 2019-06-20 DIAGNOSIS — J849 Interstitial pulmonary disease, unspecified: Secondary | ICD-10-CM | POA: Diagnosis present

## 2019-06-20 DIAGNOSIS — Z9981 Dependence on supplemental oxygen: Secondary | ICD-10-CM

## 2019-06-20 DIAGNOSIS — M79604 Pain in right leg: Secondary | ICD-10-CM

## 2019-06-20 DIAGNOSIS — Z7901 Long term (current) use of anticoagulants: Secondary | ICD-10-CM

## 2019-06-20 DIAGNOSIS — Z950 Presence of cardiac pacemaker: Secondary | ICD-10-CM

## 2019-06-20 DIAGNOSIS — J9611 Chronic respiratory failure with hypoxia: Secondary | ICD-10-CM | POA: Diagnosis present

## 2019-06-20 DIAGNOSIS — J449 Chronic obstructive pulmonary disease, unspecified: Secondary | ICD-10-CM | POA: Diagnosis present

## 2019-06-20 DIAGNOSIS — K219 Gastro-esophageal reflux disease without esophagitis: Secondary | ICD-10-CM | POA: Diagnosis present

## 2019-06-20 DIAGNOSIS — Z79891 Long term (current) use of opiate analgesic: Secondary | ICD-10-CM

## 2019-06-20 DIAGNOSIS — I872 Venous insufficiency (chronic) (peripheral): Secondary | ICD-10-CM | POA: Diagnosis present

## 2019-06-20 DIAGNOSIS — I272 Pulmonary hypertension, unspecified: Secondary | ICD-10-CM | POA: Diagnosis present

## 2019-06-20 LAB — CBC AND DIFFERENTIAL
Basophils %: 0.7 % (ref 0.0–3.0)
Basophils Absolute: 0.1 10*3/uL (ref 0.0–0.3)
Eosinophils %: 0 % (ref 0.0–7.0)
Eosinophils Absolute: 0 10*3/uL (ref 0.0–0.8)
Hematocrit: 38.7 % (ref 36.0–48.0)
Hemoglobin: 11.4 gm/dL — ABNORMAL LOW (ref 12.0–16.0)
Lymphocytes Absolute: 1.1 10*3/uL (ref 0.6–5.1)
Lymphocytes: 12.2 % — ABNORMAL LOW (ref 15.0–46.0)
MCH: 26 pg — ABNORMAL LOW (ref 28–35)
MCHC: 30 gm/dL — ABNORMAL LOW (ref 31–36)
MCV: 89 fL (ref 80–100)
MPV: 7.3 fL (ref 6.0–10.0)
Monocytes Absolute: 0.9 10*3/uL (ref 0.1–1.7)
Monocytes: 10.3 % (ref 3.0–15.0)
Neutrophils %: 76.7 % (ref 42.0–78.0)
Neutrophils Absolute: 6.7 10*3/uL (ref 1.7–8.6)
PLT CT: 195 10*3/uL (ref 130–440)
RBC: 4.32 10*6/uL (ref 3.80–5.00)
RDW: 16.8 % — ABNORMAL HIGH (ref 10.5–14.5)
WBC: 8.7 10*3/uL (ref 4.0–11.0)

## 2019-06-20 LAB — BASIC METABOLIC PANEL
Anion Gap: 17.1 mMol/L (ref 7.0–18.0)
BUN / Creatinine Ratio: 40 Ratio — ABNORMAL HIGH (ref 10.0–30.0)
BUN: 52 mg/dL — ABNORMAL HIGH (ref 7–22)
CO2: 33 mMol/L — ABNORMAL HIGH (ref 20.0–30.0)
Calcium: 10.6 mg/dL — ABNORMAL HIGH (ref 8.5–10.5)
Chloride: 87 mMol/L — ABNORMAL LOW (ref 98–110)
Creatinine: 1.3 mg/dL — ABNORMAL HIGH (ref 0.60–1.20)
EGFR: 39 mL/min/{1.73_m2} — ABNORMAL LOW (ref 60–150)
Glucose: 107 mg/dL — ABNORMAL HIGH (ref 71–99)
Osmolality Calculated: 281 mOsm/kg (ref 275–300)
Potassium: 4.1 mMol/L (ref 3.5–5.3)
Sodium: 133 mMol/L — ABNORMAL LOW (ref 136–147)

## 2019-06-20 LAB — MAGNESIUM: Magnesium: 2 mg/dL (ref 1.6–2.6)

## 2019-06-20 MED ORDER — NALOXONE HCL 0.4 MG/ML IJ SOLN (WRAP)
0.40 mg | INTRAMUSCULAR | Status: DC | PRN
Start: 2019-06-20 — End: 2019-06-24

## 2019-06-20 MED ORDER — ALLOPURINOL 100 MG PO TABS
100.0000 mg | ORAL_TABLET | Freq: Every day | ORAL | Status: DC
Start: 2019-06-21 — End: 2019-06-21
  Administered 2019-06-21: 09:00:00 100 mg via ORAL
  Filled 2019-06-20: qty 1

## 2019-06-20 MED ORDER — WARFARIN SODIUM 3 MG PO TABS
3.0000 mg | ORAL_TABLET | Freq: Every day | ORAL | Status: DC
Start: 2019-06-21 — End: 2019-06-23
  Administered 2019-06-21 – 2019-06-22 (×2): 3 mg via ORAL
  Filled 2019-06-20 (×2): qty 1

## 2019-06-20 MED ORDER — SODIUM CHLORIDE (PF) 0.9 % IJ SOLN
3.00 mL | Freq: Three times a day (TID) | INTRAMUSCULAR | Status: DC
Start: 2019-06-20 — End: 2019-06-24
  Administered 2019-06-20 – 2019-06-24 (×10): 3 mL via INTRAVENOUS
  Filled 2019-06-20 (×4): qty 10

## 2019-06-20 MED ORDER — OXYCODONE-ACETAMINOPHEN 5-325 MG PO TABS
ORAL_TABLET | ORAL | Status: AC
Start: 2019-06-20 — End: ?
  Filled 2019-06-20: qty 1

## 2019-06-20 MED ORDER — FUROSEMIDE 10 MG/ML IJ SOLN
40.00 mg | Freq: Two times a day (BID) | INTRAMUSCULAR | Status: DC
Start: 2019-06-20 — End: 2019-06-20
  Filled 2019-06-20: qty 4

## 2019-06-20 MED ORDER — FUROSEMIDE 10 MG/ML IJ SOLN
40.00 mg | Freq: Two times a day (BID) | INTRAMUSCULAR | Status: DC
Start: 2019-06-21 — End: 2019-06-20

## 2019-06-20 MED ORDER — LATANOPROST 0.005 % OP SOLN
1.00 [drp] | Freq: Every evening | OPHTHALMIC | Status: DC
Start: 2019-06-20 — End: 2019-06-24
  Administered 2019-06-20 – 2019-06-23 (×4): 1 [drp] via OPHTHALMIC
  Filled 2019-06-20: qty 1

## 2019-06-20 MED ORDER — VH WARFARIN THERAPY PLACEHOLDER
1.00 | Status: DC
Start: 2019-06-20 — End: 2019-06-24

## 2019-06-20 MED ORDER — METOLAZONE 2.5 MG PO TABS
2.5000 mg | ORAL_TABLET | Freq: Every day | ORAL | Status: DC
Start: 2019-06-21 — End: 2019-06-20

## 2019-06-20 MED ORDER — ALBUTEROL SULFATE (2.5 MG/3ML) 0.083% IN NEBU
2.50 mg | INHALATION_SOLUTION | RESPIRATORY_TRACT | Status: DC | PRN
Start: 2019-06-20 — End: 2019-06-24
  Administered 2019-06-21 – 2019-06-24 (×7): 2.5 mg via RESPIRATORY_TRACT
  Filled 2019-06-20 (×7): qty 3

## 2019-06-20 MED ORDER — OXYCODONE-ACETAMINOPHEN 5-325 MG PO TABS
1.0000 | ORAL_TABLET | ORAL | Status: DC | PRN
Start: 2019-06-20 — End: 2019-06-21
  Administered 2019-06-20 – 2019-06-21 (×3): 1 via ORAL
  Filled 2019-06-20 (×2): qty 1

## 2019-06-20 MED ORDER — VH BIO-K PLUS PROBIOTIC 50 BIL CFU CAPSULE
50.00 | DELAYED_RELEASE_CAPSULE | Freq: Every day | ORAL | Status: DC
Start: 2019-06-20 — End: 2019-06-24
  Administered 2019-06-20 – 2019-06-24 (×5): 50 via ORAL
  Filled 2019-06-20 (×5): qty 1

## 2019-06-20 MED ORDER — FLUOROMETHOLONE 0.1 % OP SUSP
1.00 [drp] | Freq: Four times a day (QID) | OPHTHALMIC | Status: DC
Start: 2019-06-20 — End: 2019-06-24

## 2019-06-20 MED ORDER — VH VANCOMYCIN THERAPY PLACEHOLDER
Status: DC
Start: 2019-06-20 — End: 2019-06-24

## 2019-06-20 MED ORDER — PIPERACILLIN SOD-TAZOBACTAM SO 4.5 (4-0.5) G IV SOLR
4.50 g | Freq: Two times a day (BID) | INTRAVENOUS | Status: DC
Start: 2019-06-20 — End: 2019-06-21
  Administered 2019-06-20 – 2019-06-21 (×2): 4.5 g via INTRAVENOUS
  Filled 2019-06-20 (×2): qty 20

## 2019-06-20 MED ORDER — VH POTASSIUM CHLORIDE CRYS ER 10 MEQ PO TBCR (WRAP)
10.00 meq | EXTENDED_RELEASE_TABLET | Freq: Two times a day (BID) | ORAL | Status: DC
Start: 2019-06-20 — End: 2019-06-24
  Administered 2019-06-20 – 2019-06-24 (×8): 10 meq via ORAL
  Filled 2019-06-20 (×8): qty 1

## 2019-06-20 MED ORDER — VH HYDROMORPHONE HCL PF 1 MG/ML CARPUJECT
1.00 mg | INTRAMUSCULAR | Status: DC | PRN
Start: 2019-06-20 — End: 2019-06-21

## 2019-06-20 MED ORDER — OXYCODONE HCL ER 10 MG PO T12A
10.00 mg | EXTENDED_RELEASE_TABLET | Freq: Two times a day (BID) | ORAL | Status: DC
Start: 2019-06-20 — End: 2019-06-20

## 2019-06-20 MED ORDER — OXYCODONE HCL ER 20 MG PO T12A
20.00 mg | EXTENDED_RELEASE_TABLET | Freq: Two times a day (BID) | ORAL | Status: DC
Start: 2019-06-20 — End: 2019-06-21
  Administered 2019-06-20 – 2019-06-21 (×2): 20 mg via ORAL
  Filled 2019-06-20 (×2): qty 1

## 2019-06-20 MED ORDER — SODIUM CHLORIDE 0.9 % IV SOLN
750.00 mg | INTRAVENOUS | Status: DC
Start: 2019-06-20 — End: 2019-06-24
  Administered 2019-06-20 – 2019-06-23 (×4): 750 mg via INTRAVENOUS
  Filled 2019-06-20 (×4): qty 0.75

## 2019-06-20 MED ORDER — VANCOMYCIN HCL 1 G IV SOLR
1000.00 mg | Freq: Two times a day (BID) | INTRAVENOUS | Status: DC
Start: 2019-06-20 — End: 2019-06-20

## 2019-06-20 MED ORDER — METOPROLOL SUCCINATE ER 50 MG PO TB24
100.00 mg | ORAL_TABLET | Freq: Every day | ORAL | Status: DC
Start: 2019-06-21 — End: 2019-06-24
  Administered 2019-06-21 – 2019-06-23 (×3): 100 mg via ORAL
  Filled 2019-06-20 (×4): qty 2

## 2019-06-20 MED ORDER — PREDNISONE 10 MG PO TABS
10.0000 mg | ORAL_TABLET | Freq: Every morning | ORAL | Status: DC
Start: 2019-06-21 — End: 2019-06-22
  Administered 2019-06-21 – 2019-06-22 (×2): 10 mg via ORAL
  Filled 2019-06-20 (×2): qty 1

## 2019-06-20 NOTE — Telephone Encounter (Signed)
[name] Bonito, [name]'s daughter called asking if Dr. Amada Jupiter could increase the strength of her pain meds, they are not keeping the pain down. [phone], daughters [phone]. [facility]

## 2019-06-20 NOTE — ED Provider Notes (Signed)
Chief Complaint   Patient presents with   . Leg Problem        HPI The patient is a 79 y.o. female  who presents to the ER with right leg pain.  The patient has had an ulcer on the back of the right leg distally for almost a year now.  She says however it is worse recently and becoming more painful.  She is taking hydrocodone 4 times a day but continues to complain of severe pain.  There is pain coming up into her calf.  She is not had any fever.  She does have a history of atrial fibrillation and is on Coumadin.  She last had an INR checked couple of weeks ago that was 2.6.  She says that she is getting up some but says this pain has been getting worse.    ROS: 10 systems reviewed and negative unless noted in HPI    Past Medical History:   Diagnosis Date   . Aortic aneurysm    . Atrial fibrillation    . Chronic obstructive pulmonary disease    . Coronary artery disease    . Gastroesophageal reflux disease    . Hypertension    . Malignant neoplasm    . Pacemaker    . SSS (sick sinus syndrome)         Social History     Tobacco Use   . Smoking status: Never Smoker   . Smokeless tobacco: Never Used   Substance Use Topics   . Alcohol use: No   . Drug use: No          BP 143/72   Pulse 75   Temp 97.5 F (36.4 C) (Oral)   Resp 22   Ht 1.524 m   Wt 77.3 kg   SpO2 100%   BMI 33.28 kg/m      Physical Exam:    General: well developed, no acute distress, chronically ill-appearing on oxygen  Eyes: normal appearing. No icterus  Lungs no respiratory distress.  No lymphadenopathy  There is chronic venous stasis of the right and left lower extremity.  There are 3 ulcerations that are somewhat deep on the right.  There is 1 measuring about the size of a half dollar into the size of about a nickel.  There is some pain extending up into the calf.  There is some warmth around the area.  Psych: Anxious  Neuro: awake, alert , oriented.     MDM: The patient's primary care physician is Dr. Amada Jupiter.  He is here today in the hospital  and I spoke with him.  He said he would just go ahead and admit the patient at this time for pain control and wound care.  I asked him about putting in orders and he said he would just put in the orders himself.  The patient does not appear especially toxic at this time but certainly is having a lot of pain.  I do not believe this patient has evidence of a DVT.  I suspect that this is related to the ulcer and chronic venous stasis.      Results     Procedure Component Value Units Date/Time    Magnesium [[id number]] Collected: 06/20/19 1600    Specimen: Plasma Updated: 06/20/19 1644     Magnesium 2.0 mg/dL     Basic Metabolic Panel [[id number]]  (Abnormal) Collected: 06/20/19 1600    Specimen: Plasma Updated: 06/20/19 1644     Sodium 133  mMol/L      Potassium 4.1 mMol/L      Chloride 87 mMol/L      CO2 33.0 mMol/L      Calcium 10.6 mg/dL      Glucose 213 mg/dL      Creatinine 0.86 mg/dL      BUN 52 mg/dL      Anion Gap 57.8 mMol/L      BUN / Creatinine Ratio 40.0 Ratio      EGFR 39 mL/min/1.53m2      Osmolality Calculated 281 mOsm/kg     CBC and differential [[id number]]  (Abnormal) Collected: 06/20/19 1600    Specimen: Blood Updated: 06/20/19 1643     WBC 8.7 K/cmm      RBC 4.32 M/cmm      Hemoglobin 11.4 gm/dL      Hematocrit 41.3 %      MCV 89 fL      MCH 26 pg      MCHC 30 gm/dL      RDW 24.4 %      PLT CT 195 K/cmm      MPV 7.3 fL      Neutrophils % 76.7 %      Lymphocytes 12.2 %      Monocytes 10.3 %      Eosinophils % 0.0 %      Basophils % 0.7 %      Neutrophils Absolute 6.7 K/cmm      Lymphocytes Absolute 1.1 K/cmm      Monocytes Absolute 0.9 K/cmm      Eosinophils Absolute 0.0 K/cmm      Basophils Absolute 0.1 K/cmm            No results found.       Disposition: admit to page    Based on the patient's presentation today including history and physical examination, it is felt that the patient  needs admission for further treatment and evaluation.    Diagnosis:    1. Venous stasis ulcer of right calf with other  ulcer severity, unspecified whether varicose veins present           Domenic Schwab, MD  06/20/19 [id number]

## 2019-06-20 NOTE — EDIE (Signed)
COLLECTIVE?NOTIFICATION?06/20/2019 14:45?[name], [name]?MRN: [id number]    [facility]'s patient encounter information:   VWU:?[id number]  Account [id number]  Billing Account [id number]      Criteria Met      High Utilization (6+ ED Visits/6 Mo.)    Security and Safety  No recent Security Events currently on file    ED Care Guidelines  There are currently no ED Care Guidelines for this patient. Please check your facility's medical records system.        Prescription Monitoring Program  331??- Narcotic Use Score  170??- Sedative Use Score  000??- Stimulant Use Score  140??- Overdose Risk Score  - All Scores range from 000-999 with 75% of the population scoring < 200 and on 1% scoring above 650  - The last digit of the narcotic, sedative, and stimulant score indicates the number of active prescriptions of that type  - Higher Use scores correlate with increased prescribers, pharmacies, mg equiv, and overlapping prescriptions  - Higher Overdose Risk Scores correlate with increased risk of unintentional overdose death   Concerning or unexpectedly high scores should prompt a review of the PMP record; this does not constitute checking PMP for prescribing purposes.      E.D. Visit Count (12 mo.)  Facility Visits   [facility] 6   Total 6   Note: Visits indicate total known visits.     Recent Emergency Department Visit Summary  Date Facility [facility] Type Diagnoses or Chief Complaint   Jun 20, 2019 [facility] [place] [facility]      Leg Pain, Leg Swelling      May 04, 2019 [facility] [place] [facility]      Difficulty Breathing      Shortness of Breath      Acute on chronic diastolic (congestive) heart failure      Acute systolic (congestive) heart failure      Apr 11, 2019 [facility] [place] [facility]      chest pain/diffculty breathing      Chronic respiratory failure with hypoxia      Long term (current) use of  anticoagulants      Acute on chronic diastolic (congestive) heart failure      Permanent atrial fibrillation      Do not resuscitate      Myocardial infarction type 2      Feb 11, 2019 [facility] [place] [facility]      CATHETER ISSUE      Leg Swelling      Urinary Incontinence      Urinary tract infection, site not specified      Infection and inflammatory reaction due to indwelling urethral catheter, initial encounter      Feb 08, 2019 [facility] [place] [facility]      Trouble Breathing      Shortness of Breath      Other retention of urine      Jan 24, 2019 [facility] [place] [facility]      difficulty breathing      Shortness of Breath      Chronic obstructive pulmonary disease with (acute) exacerbation          Recent Inpatient Visit Summary  Date Facility [facility] Type Diagnoses or Chief Complaint   May 11, 2019 [facility] [place] [facility]  Acute on chronic diastolic (congestive) heart failure      Acute systolic (congestive) heart failure      May 04, 2019 [facility] [place] [facility]      Acute systolic (congestive) heart failure      Feb 27, 2019 [facility] [place] [facility]      Heart failure, unspecified      Acute on chronic systolic (congestive) heart failure      Acute on chronic diastolic (congestive) heart failure      Jan 24, 2019 [facility] [place] [facility]      Chronic obstructive pulmonary disease with (acute) exacerbation          Care Team  Provider Specialty Phone Fax Service Dates   Graciela Husbands [facility]   Current      Collective Portal  This patient has registered at the [facility] Emergency Department   For more information visit: [email or web address]     PLEASE NOTE:     1.   Any care recommendations and other clinical information are provided as guidelines or for  historical purposes only, and providers should exercise their own clinical judgment when providing care.    2.   You may only use this information for purposes of treatment, payment or health care operations activities, and subject to the limitations of applicable Collective Policies.    3.   You should consult directly with the organization that provided a care guideline or other clinical history with any questions about additional information or accuracy or completeness of information provided.    ? 2021 [place], [employer] - [email or web address]

## 2019-06-20 NOTE — Progress Notes (Signed)
NURSE NOTE SUMMARY  [facility] - MEDICAL/SURGICAL   Patient Name: [name]   Attending Physician: No att. providers found   Today's date:   06/20/2019 LOS: 0 days   Shift Summary:                                                              [name] arrived to [facility], room 203. She is awake, alert and oriented x 4. During Interview she verbalized 8/10 pain in RLE; however she mentioned that she received pain medication in ED prior to arriving to [facility]; Primary Nurse Shelva Majestic., RN notified. [name] is receiving continuous oxygen via NC at 4 L/min; SpO2 99%; dyspnea at rest and with exertion. IV in Right Forearm is saline locked. Refer to "Flow Sheets" under LDA's for Wound update; dressing changed. She is sitting up in bed with legs elevated; HOB 90 degree. Dinner tray was delivered and set up for [name]. She is now eating with no difficulty noted. She has frequently used items within reach, including call. Bed in low locked position. Fall interventions in place.     Visit Vitals  BP 143/72   Pulse 75   Temp 97.5 F (36.4 C) (Oral)   Resp 22   Ht 1.524 m (5')   Wt 77.3 kg (170 lb 6.7 oz)   SpO2 99%   BMI 33.28 kg/m      Provider Notifications:        Rapid Response Notifications:  Mobility:      PMP Activity: Step 6 - Walks in Room (06/20/2019  5:56 PM)     Weight tracking:  Family Dynamic:   Last 3 Weights for the past 72 hrs (Last 3 readings):   Weight   06/20/19 1643 77.3 kg (170 lb 6.7 oz)   06/20/19 1508 75 kg (165 lb 5.5 oz)             Last Bowel Movement   Last BM Date: 06/18/19

## 2019-06-20 NOTE — Progress Notes (Signed)
NURSE NOTE SUMMARY  [facility] - MEDICAL/SURGICAL   Patient Name: [name]   Attending Physician: No att. providers found   Today's date:   06/20/2019 LOS: 0 days   Shift Summary:                                                              1902: Assumed patient care. Continues on 4L O2; no SOB observed. Patient in bed comfortably with R leg elevated on pillow. Call bell education complete; no complaints or needs at present. 0/10 pain per patient.   2109:  IV abx hung per order. Scheduled pain medication given; 5/10 pain stated by patient. Additional blanket provided per patient request as well as additional pillow under patient's R leg. [name] states she is comfortable.   0544: PRN pain medication given. 7/10 pain in RLE   Provider Notifications:        Rapid Response Notifications:  Mobility:      PMP Activity: Step 6 - Walks in Room (06/20/2019  5:56 PM)     Weight tracking:  Family Dynamic:   Last 3 Weights for the past 72 hrs (Last 3 readings):   Weight   06/20/19 1643 77.3 kg (170 lb 6.7 oz)   06/20/19 1508 75 kg (165 lb 5.5 oz)             Last Bowel Movement   Last BM Date: 06/18/19

## 2019-06-20 NOTE — Consults (Signed)
Pharmacy Vancomycin Dosing Consult Note  [name] [place]    Assessment:   Day #1 Vancomycin/Piperacillin-tazobactam:    [name] presents to [facility] ED with right leg pain.  She has had an ulcer on the back of the right leg distally for almost a year now.  She says however it has worsened recently and is becoming more painful.  She is taking hydrocodone 4 times a day but continues to complain of severe pain.  There is pain coming up into her calf.  She is not had any fever.  She does have a history of atrial fibrillation and is on Coumadin. Piperacillin 4.5 G every 12 hours ordered.  Plan:   Vancomycin 750 mg IVPB every 24 hours ordered to start at 2000 on 06/20/2019  Trough level TBD  Pharmacy will follow the patient's renal function, vancomycin levels, and dosing during the course of therapy. If you have any questions, please contact [facility] pharmacist at [phone] or [facility] Pharmacist evenings and weekends.     Indication: Cellulitis  Trough Goal: 10-15 mcg/ml  Age: 79 y.o.  Height: 1.524 m (5')  Weight:  77.3 kg (170 lb 6.7 oz)  IBW: 45.5 kg  DW: 58.22 kg    CrCl: 31.7 ml/min     Population Estimate Pharmacokinetics:   Kel: 0.030 hr-1        t50: 23.1 hrs        Vd: 40.75 L                   Date Regimen Trough Date/Time Trough Level Pt Specific Ke   06/20/2019 Vancomycin 750 mg IV q24h TBD                            Recent Labs   Lab 06/20/19  1600   Creatinine 1.30*   BUN 52*     Recent Labs   Lab 06/20/19  1600   WBC 8.7     @TMAX (24)@    Microbiology Results       None            Kristine Royal, PharmD.  [facility]

## 2019-06-21 DIAGNOSIS — G4737 Central sleep apnea in conditions classified elsewhere: Secondary | ICD-10-CM | POA: Clinically undetermined

## 2019-06-21 DIAGNOSIS — L03115 Cellulitis of right lower limb: Secondary | ICD-10-CM | POA: Diagnosis present

## 2019-06-21 LAB — BASIC METABOLIC PANEL
Anion Gap: 15 mMol/L (ref 7.0–18.0)
BUN / Creatinine Ratio: 43.1 Ratio — ABNORMAL HIGH (ref 10.0–30.0)
BUN: 47 mg/dL — ABNORMAL HIGH (ref 7–22)
CO2: 34 mMol/L — ABNORMAL HIGH (ref 20.0–30.0)
Calcium: 10.6 mg/dL — ABNORMAL HIGH (ref 8.5–10.5)
Chloride: 91 mMol/L — ABNORMAL LOW (ref 98–110)
Creatinine: 1.09 mg/dL (ref 0.60–1.20)
EGFR: 48 mL/min/{1.73_m2} — ABNORMAL LOW (ref 60–150)
Glucose: 99 mg/dL (ref 71–99)
Osmolality Calculated: 284 mOsm/kg (ref 275–300)
Potassium: 4 mMol/L (ref 3.5–5.3)
Sodium: 136 mMol/L (ref 136–147)

## 2019-06-21 LAB — URIC ACID: Uric acid: 6.8 mg/dL — ABNORMAL HIGH (ref 2.6–6.0)

## 2019-06-21 LAB — VH URINALYSIS WITH MICROSCOPIC AND CULTURE IF INDICATED
Bilirubin, UA: NEGATIVE mg/dL
Blood, UA: NEGATIVE mg/dL
Glucose, UA: NEGATIVE mg/dL
Ketones UA: NEGATIVE mg/dL
Leukocyte Esterase, UA: NEGATIVE Leu/uL
Nitrite, UA: NEGATIVE
Protein, UR: NEGATIVE mg/dL
Urine Specific Gravity: 1.01 (ref 1.001–1.040)
Urobilinogen, UA: 0.2 mg/dL
pH, Urine: 5.5 pH (ref 5.0–8.0)

## 2019-06-21 LAB — PT/INR
PT INR: 1.9 — ABNORMAL HIGH (ref 0.9–1.2)
PT: 19.6 s — ABNORMAL HIGH (ref 9.4–11.3)

## 2019-06-21 LAB — CBC
Hematocrit: 39 % (ref 36.0–48.0)
Hemoglobin: 11.2 gm/dL — ABNORMAL LOW (ref 12.0–16.0)
MCH: 26 pg — ABNORMAL LOW (ref 28–35)
MCHC: 29 gm/dL — ABNORMAL LOW (ref 31–36)
MCV: 91 fL (ref 80–100)
MPV: 6.6 fL (ref 6.0–10.0)
PLT CT: 154 10*3/uL (ref 130–440)
RBC: 4.28 10*6/uL (ref 3.80–5.00)
RDW: 16.8 % — ABNORMAL HIGH (ref 10.5–14.5)
WBC: 8.3 10*3/uL (ref 4.0–11.0)

## 2019-06-21 LAB — C-REACTIVE PROTEIN: C-Reactive Protein: 1.2 mg/dL — ABNORMAL HIGH (ref 0.02–0.80)

## 2019-06-21 MED ORDER — PIPERACILLIN SOD-TAZOBACTAM SO 2.25 (2-0.25) G IV SOLR
2.25 g | Freq: Four times a day (QID) | INTRAVENOUS | Status: DC
Start: 2019-06-21 — End: 2019-06-24
  Administered 2019-06-21 – 2019-06-24 (×12): 2.25 g via INTRAVENOUS
  Filled 2019-06-21 (×13): qty 2.25

## 2019-06-21 MED ORDER — OXYCODONE-ACETAMINOPHEN 5-325 MG PO TABS
1.0000 | ORAL_TABLET | Freq: Three times a day (TID) | ORAL | Status: DC | PRN
Start: 2019-06-21 — End: 2019-06-24
  Administered 2019-06-22 – 2019-06-24 (×5): 1 via ORAL
  Filled 2019-06-21 (×5): qty 1

## 2019-06-21 MED ORDER — ALLOPURINOL 100 MG PO TABS
300.0000 mg | ORAL_TABLET | Freq: Every day | ORAL | Status: DC
Start: 2019-06-22 — End: 2019-06-24
  Administered 2019-06-22 – 2019-06-24 (×3): 300 mg via ORAL
  Filled 2019-06-21 (×3): qty 3

## 2019-06-21 MED ORDER — FUROSEMIDE 10 MG/ML IJ SOLN
40.00 mg | Freq: Two times a day (BID) | INTRAMUSCULAR | Status: DC
Start: 2019-06-21 — End: 2019-06-23
  Administered 2019-06-21 – 2019-06-23 (×4): 40 mg via INTRAVENOUS
  Filled 2019-06-21 (×4): qty 4

## 2019-06-21 MED ORDER — METOLAZONE 2.5 MG PO TABS
2.5000 mg | ORAL_TABLET | Freq: Every day | ORAL | Status: DC
Start: 2019-06-21 — End: 2019-06-24
  Administered 2019-06-21 – 2019-06-24 (×4): 2.5 mg via ORAL
  Filled 2019-06-21 (×4): qty 1

## 2019-06-21 MED ORDER — OXYCODONE HCL ER 10 MG PO T12A
10.00 mg | EXTENDED_RELEASE_TABLET | Freq: Two times a day (BID) | ORAL | Status: DC
Start: 2019-06-21 — End: 2019-06-24
  Administered 2019-06-21 – 2019-06-24 (×6): 10 mg via ORAL
  Filled 2019-06-21 (×6): qty 1

## 2019-06-21 NOTE — Progress Notes (Signed)
NURSE NOTE SUMMARY  [facility] - MEDICAL/SURGICAL   Patient Name: [name]   Attending Physician: Graciela Husbands, DO   Today's date:   06/21/2019 LOS: 0 days   Shift Summary:                                                              1901: Assumed patient care.   2043: Patient resting in bed; denies pain. R leg elevated on pillows x2. Call bell within reach. IV abx administered. Bed in low locked position with alarm on for safety.   2257[name] assisted x 1 to bedside commode.   9147: PRN pain medication given 7/10 pain in R leg. R leg elevated on pillows x2  0510: Pain 3/10.      Provider Notifications:        Rapid Response Notifications:  Mobility:      PMP Activity: Step 6 - Walks in Room (06/21/2019 10:42 PM)     Weight tracking:  Family Dynamic:   Last 3 Weights for the past 72 hrs (Last 3 readings):   Weight   06/20/19 1643 77.3 kg (170 lb 6.7 oz)   06/20/19 1508 75 kg (165 lb 5.5 oz)             Last Bowel Movement   Last BM Date: 06/18/19

## 2019-06-21 NOTE — Consults (Signed)
Per the [facility] Automatic Renal Dosing Protocol:    The following medication was adjusted based on the patient's creatinine clearance:    Medication:Piperacillin-tazobactam   Estimated Creatinine Clearance: 37.8 mL/min (based on SCr of 1.09 mg/dL).  Previous Dosing Regimen:Piperacillin-tazobactam 4.5 g every 12 hours  New Dosing Regimen:Piperacillin-tazobactam 2.25 G every 6 hours    Pharmacy will follow the patient's renal function and dosing during the course of therapy.  Additional recommendations will appear in follow up notes.  If you have any questions, please contact [facility] pharmacy at [phone] on weekends and evenings or [facility]acy at [phone] weekdays  Thank you

## 2019-06-21 NOTE — H&P (Addendum)
Lovely 79 year old woman admitted yesterday with extreme pain in her right leg from complicated venous stasis ulcers.  On admission we feel that there is probable superficial phlebitis and probable cellulitis complicating these ulcers.  The pain is extreme.  We began OxyContin 20 mg every 12 hours along with oxycodone for breakthrough.  The patient was waking up during the night quite short of breath.  This was likely caused by apnea.  The apnea was either caused or contributed to by the opioids.  Because of this we will cut back on the dosage.  She does look more comfortable than yesterday.  Again there is no edema in the legs.  She appears euvolemic from her heart failure.  There is tenderness along the distal medial right leg above the malleolus.  The leg is dressed.  We are using wet-to-dry dressings ordered yesterday on admission.    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  She has facial telangiectasias across her cheeks  She is able to awaken but as I talked to her she begins to fall asleep.  Heart S1-S2 no murmurs.  The rhythm is irregular and today fast  Lungs are clear  Abdomen is soft and nontender  The right lower extremity is tender along the saphenous vein distribution distally.  She has dense pigmentation and scarring on the opposite leg from previous venous stasis ulcer injuries.    Results     Procedure Component Value Units Date/Time    Uric acid [[id number]]  (Abnormal) Collected: 06/21/19 0630    Specimen: Plasma Updated: 06/21/19 1127     Uric acid 6.8 mg/dL     CBC without differential [[id number]]  (Abnormal) Collected: 06/21/19 0600    Specimen: Blood Updated: 06/21/19 0702     WBC 8.3 K/cmm      RBC 4.28 M/cmm      Hemoglobin 11.2 gm/dL      Hematocrit 78.2 %      MCV 91 fL      MCH 26 pg      MCHC 29 gm/dL      RDW 95.6 %      PLT CT 154 K/cmm      MPV 6.6 fL     Prothrombin time/INR [[id number]]  (Abnormal)  Collected: 06/21/19 0600    Specimen: Blood Updated: 06/21/19 0632     PT 19.6 sec      PT INR 1.9    Basic Metabolic Panel [[id number]]  (Abnormal) Collected: 06/21/19 0600    Specimen: Plasma Updated: 06/21/19 4132     Sodium 136 mMol/L      Potassium 4.0 mMol/L      Chloride 91 mMol/L      CO2 34.0 mMol/L      Calcium 10.6 mg/dL      Glucose 99 mg/dL      Creatinine 4.40 mg/dL      BUN 47 mg/dL      Anion Gap 10.2 mMol/L      BUN / Creatinine Ratio 43.1 Ratio      EGFR 48 mL/min/1.84m2      Osmolality Calculated 284 mOsm/kg     Magnesium [[id number]] Collected: 06/20/19 1600    Specimen: Plasma Updated: 06/20/19 1644     Magnesium 2.0 mg/dL     Basic Metabolic Panel [[id number]]  (Abnormal) Collected:  06/20/19 1600    Specimen: Plasma Updated: 06/20/19 1644     Sodium 133 mMol/L      Potassium 4.1 mMol/L      Chloride 87 mMol/L      CO2 33.0 mMol/L      Calcium 10.6 mg/dL      Glucose 540 mg/dL      Creatinine 9.81 mg/dL      BUN 52 mg/dL      Anion Gap 19.1 mMol/L      BUN / Creatinine Ratio 40.0 Ratio      EGFR 39 mL/min/1.77m2      Osmolality Calculated 281 mOsm/kg     CBC and differential [[id number]]  (Abnormal) Collected: 06/20/19 1600    Specimen: Blood Updated: 06/20/19 1643     WBC 8.7 K/cmm      RBC 4.32 M/cmm      Hemoglobin 11.4 gm/dL      Hematocrit 30.8 %      MCV 89 fL      MCH 26 pg      MCHC 30 gm/dL      RDW 65.7 %      PLT CT 195 K/cmm      MPV 7.3 fL      Neutrophils % 76.7 %      Lymphocytes 12.2 %      Monocytes 10.3 %      Eosinophils % 0.0 %      Basophils % 0.7 %      Neutrophils Absolute 6.7 K/cmm      Lymphocytes Absolute 1.1 K/cmm      Monocytes Absolute 0.9 K/cmm      Eosinophils Absolute 0.0 K/cmm      Basophils Absolute 0.1 K/cmm         Active Hospital Problems    Diagnosis    Cellulitis of right lower extremity    Severe pain    Venous stasis ulcer of right calf with fat layer exposed without varicose veins    Cellulitis of right leg    Stage 3a chronic kidney disease    Chronic  venous insufficiency    Pulmonary HTN    Chronic respiratory failure with hypoxia, on home O2 therapy    Chronic interstitial lung disease    Chronic diastolic CHF (congestive heart failure)    Permanent atrial fibrillation     Plan: There is good evidence that these ulcerations have caused some local cellulitis with further inflammation recruitment with associated pain, swelling, and worsening induration and rubor.  For this reason we are using Zosyn/vancomycin.  I will add a C-reactive protein to today's labs and check a sed rate in the morning.  I have also cut back on the dosages of oxycodone.  The OxyContin now will be 10 mg every 12 hours and the Percocet will be given every 8 hours if needed for severe pain.  Dilaudid has been discontinued.    Electronically signed by Graciela Husbands D.O.

## 2019-06-21 NOTE — H&P (Signed)
[name] is an unfortunate 78 year old woman with a multitude of cardiac problems that have culminated in chronic heart failure.  They include chronic atrial fibrillation, chronic chamber enlargement, pulmonary hypertension, aortic valve insufficiency and mitral insufficiency.  As a result she had years of chronic edema in her legs and has not been euvolemic for very long.  As a result of this she developed serious venous insufficiency with superficial phlebitis, dermal congestion with resultant tissue hypoxia due to poor capillary flow and then breakdown of skin from necrosis causing ulcerations.  The ulcerations are now down to the subcutaneous fat.  There are multiple and involve the distal right leg around the medial malleolus at the base of the saphenous vein.  They are extremely painful.  We tried to address the pain with hydrocodone but are unsuccessful.  She was recently hospitalized with suspected cellulitis in the area.  We diuresed her well and got rid of her edema.  Nonetheless her social situation does not allow her to spend a lot of time with the proper leg elevation.  She is alone during the day.  Her daughter lives with her but she lives downstairs and according to our patient she smokes and drinks and sometimes can be neglectful.  This has been an ongoing situation.  The patient cannot stand to have any type of compression dressing on these legs because of severe pain.  She came to the emergency room today because of severe discomfort and was suspected by her home care nurse to "have a blood clot."  The patient has been anticoagulated with warfarin for quite a long time and it is unlikely that she has venous thrombosis as the root of her trouble.  (If it were only that easy.)    She came to the emergency room today and I was called about her and agreed to admit her.    Active Ambulatory Problems     Diagnosis Date Noted   . CHF (congestive heart failure) 11/05/2013   . Permanent atrial fibrillation  11/06/2013   . Hyperkalemia 08/24/2014   . Chronic diastolic CHF (congestive heart failure) 08/25/2014   . On home oxygen therapy 08/25/2014   . Aortic regurgitation 08/25/2014   . Pacemaker 08/25/2014   . Pacemaker at end of battery life 03/22/2015   . 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 Chronic interstitial lung disease 07/19/2015   . Thoracic aortic aneurysm without rupture 07/19/2015   . Difficulty breathing 04/25/2017   . Chronic respiratory failure with hypoxia, on home O2 therapy 04/26/2017   . Nonobstructive atherosclerosis of coronary artery 04/23/2018   . SSS (sick sinus syndrome) 04/25/2018   . Drug-induced constipation 01/27/2019   . Moderate asthma with acute exacerbation 01/28/2019   . Pulmonary HTN 01/30/2019   . Chronic venous insufficiency 02/13/2019   . Bilateral lower extremity edema 02/13/2019   . Diastolic CHF, acute on chronic 05/05/2019   . Venous stasis ulcer of right ankle with fat layer exposed without varicose veins 05/05/2019   . Chronic kidney disease (CKD), active medical management without dialysis, stage 3 (moderate) 05/06/2019   . Cellulitis of right leg 05/06/2019   . Hyponatremia 05/11/2019     Resolved Ambulatory Problems     Diagnosis Date Noted   . Urinary tract infection with hematuria, site unspecified    . Acute exacerbation of CHF (congestive heart failure) 07/16/2015   . Nausea 01/27/2019   . Headache 01/27/2019   . Acute gout of right foot 03/01/2019  Past Medical History:   Diagnosis Date   . Aortic aneurysm    . Atrial fibrillation    . Chronic obstructive pulmonary disease    . Coronary artery disease    . Gastroesophageal reflux disease    . Hypertension    . Malignant neoplasm      Current Discharge Medication List      CONTINUE these medications which have NOT CHANGED    Details   albuterol (PROVENTIL) (2.5 MG/3ML) 0.083% nebulizer solution Take 2.5 mg by nebulization every 6 (six) hours as needed for Wheezing.      diltiazem (TIAZAC) 240 MG 24 hr capsule  Take 240 mg by mouth 2 (two) times daily.         fluorometholone (EFLONE) 0.1 % ophthalmic suspension Place 1 drop into the left eye 4 (four) times daily         furosemide (LASIX) 40 MG tablet Take 1 tablet (40 mg total) by mouth 2 (two) times daily  Qty: 180 tablet, Refills: 3      HYDROcodone-acetaminophen (NORCO) 5-325 MG per tablet Take 1 tablet by mouth every 6 (six) hours as needed for Pain  Qty: 90 tablet, Refills: 0    Comments: Notify patient when ready.      latanoprost (XALATAN) 0.005 % ophthalmic solution Place 1 drop into both eyes nightly         metOLazone (ZAROXOLYN) 2.5 MG tablet Take 1 tablet (2.5 mg total) by mouth daily as needed (if you gain 5 pounds)  Qty: 30 tablet, Refills: 0      metoprolol succinate XL (Toprol XL) 50 MG 24 hr tablet Take 1 tablet (50 mg total) by mouth daily  Qty: 30 tablet, Refills: 11      potassium chloride (K-DUR) 10 MEQ tablet Take 1 tablet (10 mEq total) by mouth daily  Qty: 30 tablet, Refills: 11      rosuvastatin (CRESTOR) 10 MG tablet Take 1 tablet (10 mg total) by mouth daily  Qty: 90 tablet, Refills: 3      spironolactone (ALDACTONE) 25 MG tablet Take 1 tablet (25 mg total) by mouth daily  Qty: 30 tablet, Refills: 11      tiotropium (SPIRIVA RESPIMAT) 2.5 MCG/ACT inhalation spray Inhale 2 puffs into the lungs daily  Qty: 4 g, Refills: 5      warfarin (COUMADIN) 3 MG tablet Take 1 tablet (3 mg total) by mouth daily.  Qty: 30 tablet, Refills: 0      allopurinol (ZYLOPRIM) 100 MG tablet Take 1 tablet (100 mg total) by mouth daily For gout prevention  Qty: 30 tablet, Refills: 5      fluticasone furoate-vilanterol (BREO ELLIPTA) 100-25 MCG/INH Aerosol Pwdr, Breath Activated Inhale 1 puff into the lungs daily  Qty: 60 each, Refills: 5      predniSONE (DELTASONE) 10 MG tablet Take 1 tablet (10 mg total) by mouth every morning with breakfast  Qty:             Review of Systems   Constitutional: Positive for chills, fever, malaise/fatigue and weight loss.        She  complains of chills and may have had a fever last night.  She has lost weight from diuresis.  She has chronic fatigue and malaise.   HENT: Positive for hearing loss. Negative for congestion.    Eyes: Negative.    Respiratory: Negative for shortness of breath and wheezing.         She has a  history of asthma which might have been cardiac asthma from heart failure.  She is on inhalers but currently is wheeze free.  She does not complain of much shortness of breath which has been a chronic problem with her over the last several years.  She appears to be euvolemic currently.   Cardiovascular: Positive for leg swelling. Negative for chest pain.   Gastrointestinal: Negative for abdominal pain, heartburn, nausea and vomiting.   Genitourinary: Negative for dysuria and frequency.   Musculoskeletal:        She has severe pain in the right leg as mentioned above.  It appears to be due to cellulitis and superficial phlebitis.  He also has ulcerations in the skin as mentioned above.   Skin:        She has breakdown of skin as mentioned in the initial paragraph.   Neurological: Positive for weakness. Negative for dizziness and headaches.   Endo/Heme/Allergies: Bruises/bleeds easily.   Psychiatric/Behavioral: Positive for depression. Negative for memory loss. The patient is nervous/anxious and has insomnia.         She has insomnia due to severe pain in her leg.       On exam she is a chronically ill woman who appears very uncomfortable.  I thought her forehead was warm to touch.  Skin is warm and dry.  Lungs are clear today  Heart S1-S2, rhythm irregular, rate controlled.  Grade 1 systolic murmur at the apex  Abdomen soft and nontender  Both both lower legs have chronic venous pigmentation.  There are scars on the left leg from healed ulcers.  There are 4 open ulcers at the base of the left leg proximal to the medial malleolus.  The largest measures 3.5 x 2.5 cm with an irregular inflamed edge and subcutaneous fat exposed in the  base.  Another ulceration has black eschar in the base and measures 1.5 x 1 cm.  She has exquisite tenderness on the skin around these ulcerations.  There is induration of the skin due to chronic inflammation.  There is tenderness proximal to the ulcerations along the distribution of the saphenous vein.  There is no tenderness in the medial thigh.  She is not encephalopathic.  Her speech is fluent.  She is able to give a good history.  She has no lateralizing neurologic signs.    Active Hospital Problems    Diagnosis   . Severe pain   . Venous stasis ulcer of right calf with fat layer exposed without varicose veins   . Cellulitis of right leg   . Chronic venous insufficiency   . Pulmonary HTN   . Chronic respiratory failure with hypoxia, on home O2 therapy   . Chronic interstitial lung disease   . Chronic diastolic CHF (congestive heart failure)   . Permanent atrial fibrillation     Plan: Based on her severe pain and physical examination, I think there is infection in this area complicating the ulcerations.  I am going to start her on IV Zosyn and vancomycin.  We are going to use wet-to-dry dressings for now until we can get some Santyl for the ulcerations.  Surgery saw her on the previous admission.  They did not think that debridement was in order.  This is a very complicated case.  These ulcers are there because of multiple reasons.  It will be difficult to heal them in the current social situation.  Nonetheless I think time in the hospital will allow [place] to get her  more comfortable and give [place] a better idea of what kind of pain management she needs when she returns home.    Electronically signed by Graciela Husbands D.O.      This note was dictated on 06/21/2019.  I saw the patient on the afternoon of 2/19.

## 2019-06-21 NOTE — Consults (Signed)
Day #2 vancomycin750 mg IVPB every 24 hours  Indication:Cellulitis Goal: 10-15 mcg/ml  Continue Vancomycin 750 mg IVPB every 24 hours  Trough level TBD               Pharmacy will continue to follow renal function and Drug Levels and adjust according.               If you have any questions please call [facility] [place] [phone] weekdays or   [facility] evenings and week               [place] at [phone].

## 2019-06-22 LAB — SEDIMENTATION RATE: Sed Rate: 26 mm/hr — ABNORMAL HIGH (ref 0–20)

## 2019-06-22 LAB — BASIC METABOLIC PANEL
Anion Gap: 14.5 mMol/L (ref 7.0–18.0)
BUN / Creatinine Ratio: 42.7 Ratio — ABNORMAL HIGH (ref 10.0–30.0)
BUN: 50 mg/dL — ABNORMAL HIGH (ref 7–22)
CO2: 34 mMol/L — ABNORMAL HIGH (ref 20.0–30.0)
Calcium: 10.4 mg/dL (ref 8.5–10.5)
Chloride: 89 mMol/L — ABNORMAL LOW (ref 98–110)
Creatinine: 1.17 mg/dL (ref 0.60–1.20)
EGFR: 44 mL/min/{1.73_m2} — ABNORMAL LOW (ref 60–150)
Glucose: 96 mg/dL (ref 71–99)
Osmolality Calculated: 281 mOsm/kg (ref 275–300)
Potassium: 3.5 mMol/L (ref 3.5–5.3)
Sodium: 134 mMol/L — ABNORMAL LOW (ref 136–147)

## 2019-06-22 LAB — PT/INR
PT INR: 1.7 — ABNORMAL HIGH (ref 0.9–1.2)
PT: 17.5 s — ABNORMAL HIGH (ref 9.4–11.3)

## 2019-06-22 MED ORDER — TRAMADOL HCL 50 MG PO TABS
50.0000 mg | ORAL_TABLET | Freq: Four times a day (QID) | ORAL | Status: DC | PRN
Start: 2019-06-22 — End: 2019-06-24
  Administered 2019-06-22 – 2019-06-23 (×4): 50 mg via ORAL
  Filled 2019-06-22 (×4): qty 1

## 2019-06-22 MED ORDER — POLYETHYLENE GLYCOL 3350 17 G PO PACK
17.00 g | PACK | Freq: Every day | ORAL | Status: DC
Start: 2019-06-22 — End: 2019-06-22

## 2019-06-22 MED ORDER — SERTRALINE HCL 50 MG PO TABS
25.0000 mg | ORAL_TABLET | Freq: Every day | ORAL | Status: DC
Start: 2019-06-22 — End: 2019-06-24
  Administered 2019-06-22 – 2019-06-24 (×3): 25 mg via ORAL
  Filled 2019-06-22 (×3): qty 1

## 2019-06-22 MED ORDER — POLYETHYLENE GLYCOL 3350 17 G PO PACK
17.00 g | PACK | Freq: Every day | ORAL | Status: DC
Start: 2019-06-22 — End: 2019-06-24
  Administered 2019-06-22 – 2019-06-24 (×3): 17 g via ORAL
  Filled 2019-06-22 (×3): qty 1

## 2019-06-22 MED ORDER — KETOROLAC TROMETHAMINE 30 MG/ML IJ SOLN
15.00 mg | Freq: Four times a day (QID) | INTRAMUSCULAR | Status: DC | PRN
Start: 2019-06-22 — End: 2019-06-24
  Administered 2019-06-22 (×2): 15 mg via INTRAVENOUS
  Filled 2019-06-22 (×2): qty 1

## 2019-06-22 MED ORDER — PREDNISONE 5 MG PO TABS
5.0000 mg | ORAL_TABLET | Freq: Every morning | ORAL | Status: DC
Start: 2019-06-23 — End: 2019-06-24
  Administered 2019-06-23 – 2019-06-24 (×2): 5 mg via ORAL
  Filled 2019-06-22 (×2): qty 1

## 2019-06-22 NOTE — Consults (Signed)
Day #3 vancomycin 750 mg IVPB every 24 hours  Indication:Cellulitis Goal: 10-15 mcg/ml              Creatinine stable at 33.6 ml/min  Continue Vancomycin 750 mg IVPB every 24 hours  Trough level ordered at 1900 on 06/23/2019 prior to fourth vancomycin dose  INR 06/22/2019 1.7               Pharmacy will continue to follow renal function and Drug Levels and adjust according.               If you have any questions please call [facility] at [phone] weekdays or                    [facility] weekends and evenings at [phone].

## 2019-06-22 NOTE — Progress Notes (Signed)
She continues to have severe and annoying pain in her right leg.  Yesterday I cut back the opioid doses because of apnea events the night before.  Apparently last night it was not as bad.  I talked with nursing today about pain control and we have decided to add Toradol intravenously and also tramadol for pain.      Vitals:    06/22/19 0834   BP: 127/69   Pulse: 74   Resp:    Temp:    SpO2:      Lungs are clear  Her cardiac rhythm is irregular and at times the rate is quite fast.  Abdomen is nontender  She has a lot of tenderness on the medial aspect of the right leg which is currently dressed.    Results     Procedure Component Value Units Date/Time    Sedimentation rate (ESR) [[id number]]  (Abnormal) Collected: 06/22/19 0530    Specimen: Blood Updated: 06/22/19 0708     Sed Rate 26 mm/hr     Basic Metabolic Panel [[id number]]  (Abnormal) Collected: 06/22/19 0530    Specimen: Plasma Updated: 06/22/19 0618     Sodium 134 mMol/L      Potassium 3.5 mMol/L      Chloride 89 mMol/L      CO2 34.0 mMol/L      Calcium 10.4 mg/dL      Glucose 96 mg/dL      Creatinine 7.82 mg/dL      BUN 50 mg/dL      Anion Gap 95.6 mMol/L      BUN / Creatinine Ratio 42.7 Ratio      EGFR 44 mL/min/1.58m2      Osmolality Calculated 281 mOsm/kg     Prothrombin time/INR [[id number]]  (Abnormal) Collected: 06/22/19 0530    Specimen: Blood Updated: 06/22/19 0610     PT 17.5 sec      PT INR 1.7    Urinalysis w Microscopic and Culture if Indicated [[id number]] Collected: 06/21/19 1520    Specimen: Urine, Random Updated: 06/21/19 1536     Color, UA Yellow     Clarity, UA Clear     Urine Specific Gravity 1.010     pH, Urine 5.5 pH      Protein, UR Negative mg/dL      Glucose, UA Negative mg/dL      Ketones UA Negative mg/dL      Bilirubin, UA Negative mg/dL      Blood, UA Negative mg/dL      Nitrite, UA Negative     Urobilinogen, UA 0.2 mg/dL      Leukocyte Esterase, UA Negative Leu/uL     C Reactive Protein [[id number]]  (Abnormal) Collected: 06/21/19  0630    Specimen: Plasma Updated: 06/21/19 1410     C-Reactive Protein 1.20 mg/dL         There is elevation of the C-reactive protein and sed rate again indicating inflammation and probably infection in the right leg.  Antibiotics, elevation, dressing changes continue.  The patient will probably need transitional care again.    Active Hospital Problems    Diagnosis   . Cellulitis of right lower extremity   . 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 Severe pain   . Venous stasis ulcer of right calf with fat layer exposed without varicose veins   . Cellulitis of right leg   . Stage 3a chronic kidney disease   . Chronic venous insufficiency   .  Pulmonary HTN   . Chronic respiratory failure with hypoxia, on home O2 therapy   . Chronic interstitial lung disease   . Chronic diastolic CHF (congestive heart failure)   . Permanent atrial fibrillation     Electronically signed by Graciela Husbands D.O.

## 2019-06-23 DIAGNOSIS — R791 Abnormal coagulation profile: Secondary | ICD-10-CM | POA: Diagnosis present

## 2019-06-23 DIAGNOSIS — N1831 Chronic kidney disease, stage 3a: Secondary | ICD-10-CM

## 2019-06-23 DIAGNOSIS — G4733 Obstructive sleep apnea (adult) (pediatric): Secondary | ICD-10-CM

## 2019-06-23 DIAGNOSIS — R079 Chest pain, unspecified: Secondary | ICD-10-CM

## 2019-06-23 LAB — BASIC METABOLIC PANEL
Anion Gap: 17.1 mMol/L (ref 7.0–18.0)
BUN / Creatinine Ratio: 38.7 Ratio — ABNORMAL HIGH (ref 10.0–30.0)
BUN: 48 mg/dL — ABNORMAL HIGH (ref 7–22)
CO2: 29 mMol/L (ref 20–30)
Calcium: 10.2 mg/dL (ref 8.5–10.5)
Chloride: 92 mMol/L — ABNORMAL LOW (ref 98–110)
Creatinine: 1.24 mg/dL — ABNORMAL HIGH (ref 0.60–1.20)
EGFR: 41 mL/min/{1.73_m2} — ABNORMAL LOW (ref 60–150)
Glucose: 86 mg/dL (ref 71–99)
Osmolality Calculated: 280 mOsm/kg (ref 275–300)
Potassium: 4.1 mMol/L (ref 3.5–5.3)
Sodium: 134 mMol/L — ABNORMAL LOW (ref 136–147)

## 2019-06-23 LAB — PT/INR
PT INR: 1.5 — ABNORMAL HIGH (ref 0.9–1.2)
PT: 16.3 s — ABNORMAL HIGH (ref 9.4–11.3)

## 2019-06-23 LAB — VANCOMYCIN, TROUGH: Vancomycin Trough: 11.51 ug/mL (ref 10.00–20.00)

## 2019-06-23 MED ORDER — COLLAGENASE 250 UNIT/GM EX OINT
TOPICAL_OINTMENT | CUTANEOUS | Status: DC
Start: 2019-06-23 — End: 2019-06-24
  Filled 2019-06-23: qty 30

## 2019-06-23 MED ORDER — FUROSEMIDE 10 MG/ML IJ SOLN
40.00 mg | Freq: Every day | INTRAMUSCULAR | Status: DC
Start: 2019-06-24 — End: 2019-06-24
  Administered 2019-06-24: 09:00:00 40 mg via INTRAVENOUS
  Filled 2019-06-23: qty 4

## 2019-06-23 MED ORDER — VH WARFARIN THERAPY PLACEHOLDER
1.00 | Status: DC
Start: 2019-06-23 — End: 2019-06-24

## 2019-06-23 MED ORDER — VH PHANTOM NON FORMULARY NOT IN DATABASE
40.00 mg | Freq: Once | Status: DC
Start: 2019-06-23 — End: 2019-06-23

## 2019-06-23 MED ORDER — WARFARIN SODIUM 5 MG PO TABS
5.0000 mg | ORAL_TABLET | Freq: Every day | ORAL | Status: DC
Start: 2019-06-23 — End: 2019-06-24
  Administered 2019-06-23: 17:00:00 5 mg via ORAL
  Filled 2019-06-23: qty 1

## 2019-06-23 MED ORDER — ENOXAPARIN SODIUM 80 MG/0.8ML SC SOLN
1.00 mg/kg | Freq: Two times a day (BID) | SUBCUTANEOUS | Status: DC
Start: 2019-06-23 — End: 2019-06-24
  Administered 2019-06-23 – 2019-06-24 (×3): 70 mg via SUBCUTANEOUS
  Filled 2019-06-23 (×3): qty 0.8

## 2019-06-23 MED ORDER — ENOXAPARIN SODIUM 80 MG/0.8ML SC SOLN
1.00 mg/kg | Freq: Two times a day (BID) | SUBCUTANEOUS | Status: DC
Start: 2019-06-23 — End: 2019-06-23

## 2019-06-23 NOTE — Progress Notes (Signed)
Day #4 vancomycin 750 mg IVPB every 24 hours  Indication:Cellulitis Goal: 10-15 mcg/ml              Creatinine lab for today has not resulted as of 1000, will monitor for updates  Continue Vancomycin 750 mg IVPB every 24 hours  Trough level ordered at 1900 on 06/23/2019 prior to fourth vancomycin dose -                  Pharmacy will continue to follow,     Waymond Cera, PharmD

## 2019-06-23 NOTE — Progress Notes (Signed)
D4 vancomycin  Indication:Cellulitis Goal: 10-42mcg/ml  1. Continue Vancomycin 750 mg q 24 h  2. Trough level drawn on 06/23/19 was therapeutic at 11.34mcg/ml. Next trough level TBD. Contact pharmacist if you have any further questions.       Marland Kitchen

## 2019-06-23 NOTE — Progress Notes (Signed)
Readmission Risk  [facility] - MEDICAL/SURGICAL   Patient Name: [facility] [name]   Attending Physician: Graciela Husbands, DO   Today's date:   06/23/2019 LOS: 2 days   Expected Discharge Date      Readmission Assessment:                                                              Discharge Planning  ReAdmit Risk Score: 33  Does the patient have perscription coverage?: (P) Yes  Utilize [place] Med Program: (P) N/A  Confirmed PCP with Pt: (P) Yes  Confirmed PCP name: (P) Dr. Amada Jupiter  Last PCP Visit Date: (P) 06/11/19  Confirm Transport to F/U Appt.: (P) Self/Private Vehicle/Friend  Social Work Referral: (P) Not Applicable  DME anticipated at discharge: (P) (no known discharge needs)  Anticipated Home Health at [place]: (P) Yes  Name of Home Health Agency Placement: (P) [facility]  Referral made for home health RN visit?: (P) Yes  Anticipated Placement at [place]: (P) Yes(possible TC need for extended IV antibiotics)  CM Comments: (P) 2/22- ? TC need for extended IV antibiotics; [facility] SN services       IDPA:   Patient Type  Within 30 Days of Previous Admission?: Yes(index IP stay 1/3-1/10/21; TC stay 1/10-1/20/21)  Healthcare Decisions  Interviewed:: Patient  Orientation/Decision Making Abilities of Patient: Alert and Oriented x3, able to make decisions  Advance Directive: Patient has advance directive, copy in chart  Healthcare Agent Appointed: Yes  Prior to admission  Prior level of function: Ambulates with assistive device, Independent with ADLs  Type of Residence: Private residence  Home Layout: One level, Ramped entrance  Have running water, electricity, heat, etc?: Yes  Living Arrangements: Children(states that her daughter and son in law live with her)  How do you get to your MD appointments?: family/friends  How do you get your groceries?: family  Who fixes your meals?: family  Who does your laundry?: family  Who picks up your prescriptions?: family  Dressing: Needs assistance  Grooming:  Independent  Feeding: Independent  Bathing: Needs assistance  Toileting: Independent  DME Currently at Home: ADL- 3-in-1 Bedside Commode, Cane, Single Point, [place], [employer], Home O2, Portable O2 Tank, Hospital Bed  Home Care/Community Services: Home Health  Name of Agency: [facility]  Frequency of services: SN 3 times weekly, no longer getting therapy and doesn't feel the need  Discharge Planning  Support Systems: Children  Patient expects to be discharged to:: home  Anticipated [place] plan discussed with:: Same as interviewed  Mode of transportation:: Private car (family member)  Does the patient have perscription coverage?: (P) Yes  Consults/Providers  PT Evaluation Needed: Yes (Comment)  OT Evalulation Needed: Yes (Comment)  SLP Evaluation Needed: No  Correct PCP listed in Epic?: Yes  Family and PCP  PCP on file was verified as the current PCP?: Yes   30 Day Readmission:       Provider Notifications:            06/23/19 0830   Discharge Planning   Does the patient have perscription coverage? Yes   Utilize  Med Program N/A   Confirmed PCP with Pt Yes   Confirmed PCP name Dr. Amada Jupiter   Last PCP Visit  Date 06/11/19   Confirm Transport to F/U Appt. Self/Private Vehicle/Friend   Social Work Referral Not Applicable   DME anticipated at discharge   (no known discharge needs)   Anticipated Home Health at [place] Yes   Name of Home Health Agency Placement [facility]   Referral made for home health RN visit? Yes   Anticipated Placement at [place] Yes  (possible TC need for extended IV antibiotics)   CM Comments 2/22- ? TC need for extended IV antibiotics; [facility] SN services

## 2019-06-23 NOTE — Telephone Encounter (Signed)
She is in the hospital now?

## 2019-06-23 NOTE — Progress Notes (Signed)
Pharmacist called from [place] regarding Vanc Trough to be drawn this evening, attempted to call lab, no answer, will follow up with primary RN and lab to make sure this is done.

## 2019-06-23 NOTE — Progress Notes (Signed)
79 yo female who admits to inpatient status with diagnosis of cellulitis of the right lower leg. Upon arrival, patient resting in bed awaiting breakfast. Patient is noted to be alert and oriented x 4 during our visit. She states that she currently lives in a 1 story home with a ramped entrance. She states that her daughter and son-in-law live with her(she owns the home). Patient voices being able to bathe, dress and toilet herself. She states that her son-in-law(Jeff) does a majority of the house cleaning, laundry, shopping, cooking and transportation. Her daughter is also available to help as needed but she voices that she works full time outside of the home. Patient denies being able to drive at this time. She states that family should be able to transport her home at time of discharge. She is ambulatory with the use of a FWW. She also has a SPC, BSC(she does not use), hospital bed(she voices to be uncomfortable), nebulizer and oxygen with portability. She states that she she gets her oxygen supplies through [employer]. Patient is currently receiving home health through [facility]. She states that a nurse visits her 3 times weekly. She states she no longer gets therapy and does not feel that she needs those services. Patient is receptive to resuming [facility] at time of discharge. [facility] notified of patient's admission. We discussed possible need for TC for extended IV needs. Patient is receptive to this at [facility] Transitional Care, if needed. Patient verifies that she has Medicare and FEP [employer]. She voices RX coverage and using [facility] for her RX needs. Dr. Amada Jupiter was confirmed as her PCP with last visit being 06/11/19. CM will continue to assess needs and intervene as indicated.      06/23/19 0815   Patient Type   Within 30 Days of Previous Admission? Yes  (index IP stay 1/3-1/10/21; TC stay 1/10-1/20/21)   Healthcare Decisions   Interviewed: Patient   Orientation/Decision Making Abilities of Patient Alert  and Oriented x3, able to make decisions   Level of Function 2 Weeks Prior to Admission   Prior level of function Ambulates with assistive device;Independent with ADLs   Level of Function Upon Admission   Current Level of Function Independent with ADLs;Ambulates with assistive device   Prior to admission   Type of Residence Private residence   Home Layout One level;Ramped entrance   Have running water, electricity, heat, etc? Yes   Living Arrangements Children  (states that her daughter and son in law live with her)   How do you get to your MD appointments? family/friends   How do you get your groceries? family   Who fixes your meals? family   Who does your laundry? family   Who picks up your prescriptions? family   DME Currently at Home ADL- 3-in-1 Bedside Commode;Cane, Single Point;Walker, Front Wheel;Home O2;Portable O2 Tank;Hospital Bed   Home Care/Community Services Home Health   Name of Agency [facility]   Frequency of services SN 3 times weekly, no longer getting therapy and doesn't feel the need   Discharge Planning   Support Systems Children   Patient expects to be discharged to: home   Anticipated [place] plan discussed with: Same as interviewed   Mode of transportation: Private car (family member)   Does the patient have perscription coverage? Yes   Consults/Providers   Correct PCP listed in Epic? Yes   Family and PCP   PCP on file was verified as the current PCP? Yes  Important Message from [facility] Notice   Patient received 1st IMM Letter? Yes   Date of most recent IMM given: 06/22/19

## 2019-06-23 NOTE — Telephone Encounter (Signed)
Dr. Dale......Please advise....pkt

## 2019-06-23 NOTE — Progress Notes (Signed)
She tells me that "I had a wonderful night with my breathing."  She slept intermittently but did not have any episodes of what sounds like apnea.  We have cut back on her opioids since admission and it looks like you are in a good spot right now.  She has no peripheral edema and no pulmonary rales or JVD to suspect heart failure.  She is currently getting metolazone 2.5 mg daily along with 40 mg of IV Lasix twice daily.  There was not a whole lot of fluid on admission this time.  Her biggest problem is the open ulcerations on the medial aspect of the right leg.  Over the weekend we use wet-to-dry dressings but Angie tells me that they were not put on properly and that there is some maceration around the lesions right now.  We are changing the dressing to Santyl on one of them and and SilvaSorb on the others.  I appreciate Angie's input in this case.    Vitals:    06/23/19 0841   BP: 113/66   Pulse: 75   Resp:    Temp: 97.2 F (36.2 C)   SpO2: 97%     Lungs are clear  Cardiac rhythm is irregular.  She has an LV heave.  S1-S2 are noted.  Abdomen is soft and nontender  She has no peripheral edema.  The leg ulcerations are the same as admission.  The skin around him and the saphenous vein coursed up the medial aspect of the right leg is exquisitely tender.    I am also concerned about the patient's INR.  Today is 1.5 and is drifting downward.  I spoke with pharmacy.  I am not sure they are following this.  We are going to give her 5 mg today and place her on full dose Lovenox until her INR is greater than 2.2.    Results     Procedure Component Value Units Date/Time    Basic Metabolic Panel [[id number]] Collected: 06/23/19 0619    Specimen: Plasma Updated: 06/23/19 1230    Prothrombin time/INR [[id number]]  (Abnormal) Collected: 06/23/19 0619    Specimen: Blood Updated: 06/23/19 0647     PT 16.3 sec      PT INR 1.5        Active Hospital Problems    Diagnosis   . Subtherapeutic international normalized ratio (INR)   .  Cellulitis of right lower extremity   . 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 Severe pain   . Venous stasis ulcer of right calf with fat layer exposed without varicose veins   . Cellulitis of right leg   . Stage 3a chronic kidney disease   . Chronic venous insufficiency   . Pulmonary HTN   . Chronic respiratory failure with hypoxia, on home O2 therapy   . Chronic interstitial lung disease   . Chronic diastolic CHF (congestive heart failure)   . Permanent atrial fibrillation     Plan: The patient will be moving to transitional care tomorrow for more wound care/IV antibiotics.  She does seem improved since admission.  Healing these ulcerations on the other hand will be a slow go.    Electronically signed by Graciela Husbands D.O.

## 2019-06-23 NOTE — Telephone Encounter (Signed)
Noted....pkt

## 2019-06-23 NOTE — UM Notes (Signed)
[facility] Utilization Management Review Sheet    Facility :  [facility]    NAME: Idora Brosious  MR#: [id number]    CSN#: [id number]    ROOM: 203/203-A AGE: 79 y.o.    ADMIT DATE AND TIME: 06/20/2019  2:59 PM      PATIENT CLASS: Observation <> 06/20/2019 3:51 PM  Changed to IP  06/21/2019 10:35 AM      ATTENDING PHYSICIAN: Graciela Husbands, DO  PAYOR:Payor: MEDICARE / Plan: MEDICARE PART A AND B / Product Type: Medicare /       AUTH #:     DIAGNOSIS:     ICD-10-CM    1. Venous stasis ulcer of right calf with other ulcer severity, unspecified whether varicose veins present  I83.012     L97.218        HISTORY:   Past Medical History:   Diagnosis Date    Aortic aneurysm     Atrial fibrillation     Chronic obstructive pulmonary disease     Coronary artery disease     Gastroesophageal reflux disease     Hypertension     Malignant neoplasm     Pacemaker     SSS (sick sinus syndrome)        DATE OF REVIEW: 06/23/2019    VITALS: BP 113/66    Pulse 75    Temp 97.2 F (36.2 C) (Oral)    Resp 19    Ht 1.524 m (5')    Wt 70.4 kg (155 lb 3.3 oz)    SpO2 97%    BMI 30.31 kg/m     Active Hospital Problems    Diagnosis    Subtherapeutic international normalized ratio (INR)    Cellulitis of right lower extremity    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 female  who presents to the ER with right leg pain.  The patient has had an ulcer on the back of the right leg distally for almost a year now.  She says however it is worse recently and becoming more painful.  She is taking  hydrocodone 4 times a day but continues to complain of severe pain.  There is pain coming up into her calf.  She is not had any fever.  She does have a history of atrial fibrillation and is on Coumadin.  She last had an INR checked couple of weeks ago that was 2.6.  She says that she is getting up some but says this pain has been getting worse.    There is chronic venous stasis of the right and left lower extremity.  There are 3 ulcerations that are somewhat deep on the right.  There is 1 measuring about the size of a half dollar into the size of about a nickel.  There is some pain extending up into the calf.  There is some warmth around the area.    Plan: Based on her severe pain and  physical examination, I think there is infection in this area complicating the ulcerations.  I am going to start her on IV Zosyn and vancomycin.  We are going to use wet-to-dry dressings for now until we can get some Santyl for the ulcerations.  Surgery saw her on the previous admission.  They did not think that debridement was in order.  This is a very complicated case.  These ulcers are there because of multiple reasons.  It will be difficult to heal them in the current social situation.  Nonetheless I think time in the hospital will allow [place] to get her more comfortable and give [place] a better idea of what kind of pain management she needs when she returns home.    Both both lower legs have chronic venous pigmentation.  There are scars on the left leg from healed ulcers.  There are 4 open ulcers at the base of the left leg proximal to the medial malleolus.  The largest measures 3.5 x 2.5 cm with an irregular inflamed edge and subcutaneous fat exposed in the base.  Another ulceration has black eschar in the base and measures 1.5 x 1 cm.  She has exquisite tenderness on the skin around these ulcerations.  There is induration of the skin due to chronic inflammation.  There is tenderness proximal to the ulcerations along the distribution of  the saphenous vein.  There is no tenderness in the medial thigh.    Electronically signed by Graciela Husbands D.O.      This note was dictated on 06/21/2019.  I saw the patient on the afternoon of 2/19.        Inpatient    Pt/inr, cbc. Bmp, UA. Crp. Urine cx. Oxygen 4L per nc. Wound care, wet/dry dressing changes, daily. Lasix 40mg  IV 2x day. Oxycontin 20mg  po Q12H. osyn 4.5gm IV Q12H. Vanc 1gm IV Q12H. Albuterol neb Q4H prn. Toradol 15mg  IV Q6H prn.  Ultram 50mg  po Q6H prn. Percocet 5/325mg  1 tab po Q8H prn. ^ right leg.       CSR   06/23/2019   Dr Amada Jupiter.     Changed to IP<> 06/21/2019 10:35 AM    Labs<> Crp 1.20, SR 26. Bun 50, Co2  34/ Na 134.     On admission we feel that there is probable superficial phlebitis and probable cellulitis complicating these ulcers.  The pain is extreme.  We began OxyContin 20 mg every 12 hours along with oxycodone for breakthrough.  The patient was waking up during the night quite short of breath.  This was likely caused by apnea.  The apnea was either caused or contributed to by the opioids.  Because of this we will cut back on the dosage.  She does look more comfortable than yesterday.  Again there is no edema in the legs.  She appears euvolemic from her heart failure.  There is tenderness along the distal medial right leg above the malleolus.  The leg is dressed.  We are using wet-to-dry dressings ordered yesterday on admission.    The right lower extremity is tender along the saphenous vein distribution distally.  She has dense pigmentation and scarring on the opposite leg from previous venous stasis ulcer injuries.      Plan: There is good evidence that these ulcerations have caused some local cellulitis with further inflammation recruitment with associated pain, swelling, and worsening induration and rubor.  For this reason we are using Zosyn/vancomycin.  I will add a C-reactive protein to  today's labs and check a sed rate in the morning.  I have also cut back on the dosages of  oxycodone.  The OxyContin now will be 10 mg every 12 hours and the Percocet will be given every 8 hours if needed for severe pain.  Dilaudid has been discontinued.    Electronically signed by Graciela Husbands D.O.        Meets [facility] M-70    Lowella Dell RN,CCM Caralee Ates    [facility]  [address]  [place], [place] [address]    Ph: [phone]  Fx: [phone]

## 2019-06-23 NOTE — Progress Notes (Signed)
Day #4 vancomycin 750 mg IVPB every 24 hours  Indication:Cellulitis Goal: 10-15 mcg/ml Indication: Cellulitis.                 Continue Vancomycin 750 mg IVPB every 24 hours  SCr is 1.24 dose will remain the same.   Trough level ordered at 1900 on 06/23/2019 prior to fourth vancomycin dose -                  Pharmacy will continue to follow,     Waymond Cera, PharmD

## 2019-06-24 ENCOUNTER — Inpatient Hospital Stay
Admission: RE | Admit: 2019-06-24 | Discharge: 2019-07-11 | DRG: 603 | Disposition: A | Discharge status: Home Health | Payer: Medicare Other | Source: Ambulatory Visit | Attending: Internal Medicine | Admitting: Internal Medicine

## 2019-06-24 DIAGNOSIS — I495 Sick sinus syndrome: Secondary | ICD-10-CM | POA: Diagnosis present

## 2019-06-24 DIAGNOSIS — R791 Abnormal coagulation profile: Secondary | ICD-10-CM | POA: Diagnosis present

## 2019-06-24 DIAGNOSIS — E876 Hypokalemia: Secondary | ICD-10-CM | POA: Diagnosis present

## 2019-06-24 DIAGNOSIS — J449 Chronic obstructive pulmonary disease, unspecified: Secondary | ICD-10-CM | POA: Diagnosis present

## 2019-06-24 DIAGNOSIS — L03115 Cellulitis of right lower limb: Principal | ICD-10-CM | POA: Diagnosis present

## 2019-06-24 DIAGNOSIS — G894 Chronic pain syndrome: Secondary | ICD-10-CM | POA: Diagnosis present

## 2019-06-24 DIAGNOSIS — I872 Venous insufficiency (chronic) (peripheral): Secondary | ICD-10-CM | POA: Diagnosis present

## 2019-06-24 DIAGNOSIS — I878 Other specified disorders of veins: Secondary | ICD-10-CM | POA: Diagnosis present

## 2019-06-24 DIAGNOSIS — Z7952 Long term (current) use of systemic steroids: Secondary | ICD-10-CM

## 2019-06-24 DIAGNOSIS — M1990 Unspecified osteoarthritis, unspecified site: Secondary | ICD-10-CM | POA: Diagnosis present

## 2019-06-24 DIAGNOSIS — H409 Unspecified glaucoma: Secondary | ICD-10-CM | POA: Diagnosis present

## 2019-06-24 DIAGNOSIS — I5032 Chronic diastolic (congestive) heart failure: Secondary | ICD-10-CM | POA: Diagnosis present

## 2019-06-24 DIAGNOSIS — J849 Interstitial pulmonary disease, unspecified: Secondary | ICD-10-CM | POA: Diagnosis present

## 2019-06-24 DIAGNOSIS — Z7901 Long term (current) use of anticoagulants: Secondary | ICD-10-CM

## 2019-06-24 DIAGNOSIS — I272 Pulmonary hypertension, unspecified: Secondary | ICD-10-CM | POA: Diagnosis present

## 2019-06-24 DIAGNOSIS — I4821 Permanent atrial fibrillation: Secondary | ICD-10-CM | POA: Diagnosis present

## 2019-06-24 DIAGNOSIS — E871 Hypo-osmolality and hyponatremia: Secondary | ICD-10-CM | POA: Diagnosis not present

## 2019-06-24 DIAGNOSIS — M109 Gout, unspecified: Secondary | ICD-10-CM | POA: Diagnosis present

## 2019-06-24 DIAGNOSIS — Z7951 Long term (current) use of inhaled steroids: Secondary | ICD-10-CM

## 2019-06-24 DIAGNOSIS — I83012 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: Secondary | ICD-10-CM | POA: Diagnosis present

## 2019-06-24 DIAGNOSIS — Z9981 Dependence on supplemental oxygen: Secondary | ICD-10-CM

## 2019-06-24 DIAGNOSIS — B370 Candidal stomatitis: Secondary | ICD-10-CM | POA: Diagnosis not present

## 2019-06-24 DIAGNOSIS — F418 Other specified anxiety disorders: Secondary | ICD-10-CM | POA: Diagnosis present

## 2019-06-24 DIAGNOSIS — K219 Gastro-esophageal reflux disease without esophagitis: Secondary | ICD-10-CM | POA: Diagnosis present

## 2019-06-24 DIAGNOSIS — G4737 Central sleep apnea in conditions classified elsewhere: Secondary | ICD-10-CM | POA: Diagnosis present

## 2019-06-24 DIAGNOSIS — R600 Localized edema: Secondary | ICD-10-CM | POA: Diagnosis present

## 2019-06-24 DIAGNOSIS — Z950 Presence of cardiac pacemaker: Secondary | ICD-10-CM

## 2019-06-24 DIAGNOSIS — I130 Hypertensive heart and chronic kidney disease with heart failure and stage 1 through stage 4 chronic kidney disease, or unspecified chronic kidney disease: Secondary | ICD-10-CM | POA: Diagnosis present

## 2019-06-24 DIAGNOSIS — J9611 Chronic respiratory failure with hypoxia: Secondary | ICD-10-CM

## 2019-06-24 DIAGNOSIS — I080 Rheumatic disorders of both mitral and aortic valves: Secondary | ICD-10-CM | POA: Diagnosis present

## 2019-06-24 DIAGNOSIS — I2510 Atherosclerotic heart disease of native coronary artery without angina pectoris: Secondary | ICD-10-CM | POA: Diagnosis present

## 2019-06-24 DIAGNOSIS — L97212 Non-pressure chronic ulcer of right calf with fat layer exposed: Secondary | ICD-10-CM | POA: Diagnosis present

## 2019-06-24 DIAGNOSIS — N1831 Chronic kidney disease, stage 3a: Secondary | ICD-10-CM | POA: Diagnosis present

## 2019-06-24 LAB — BASIC METABOLIC PANEL
Anion Gap: 16.7 mMol/L (ref 7.0–18.0)
BUN / Creatinine Ratio: 49.2 Ratio — ABNORMAL HIGH (ref 10.0–30.0)
BUN: 60 mg/dL — ABNORMAL HIGH (ref 7–22)
CO2: 30 mMol/L (ref 20.0–30.0)
Calcium: 9.8 mg/dL (ref 8.5–10.5)
Chloride: 91 mMol/L — ABNORMAL LOW (ref 98–110)
Creatinine: 1.22 mg/dL — ABNORMAL HIGH (ref 0.60–1.20)
EGFR: 42 mL/min/{1.73_m2} — ABNORMAL LOW (ref 60–150)
Glucose: 87 mg/dL (ref 71–99)
Osmolality Calculated: 285 mOsm/kg (ref 275–300)
Potassium: 3.7 mMol/L (ref 3.5–5.3)
Sodium: 134 mMol/L — ABNORMAL LOW (ref 136–147)

## 2019-06-24 LAB — ECG 12-LEAD
Patient Age: 78 years
Q-T Interval(Corrected): 498 ms
Q-T Interval: 355 ms
QRS Axis: -2 deg
QRS Duration: 102 ms
T Axis: 9 years
Ventricular Rate: 118 //min

## 2019-06-24 LAB — PT/INR
PT INR: 1.6 — ABNORMAL HIGH (ref 0.9–1.2)
PT: 17.2 s — ABNORMAL HIGH (ref 9.4–11.3)

## 2019-06-24 MED ORDER — VH BIO-K PLUS PROBIOTIC 50 BIL CFU CAPSULE
50.0000 | DELAYED_RELEASE_CAPSULE | Freq: Every day | ORAL | Status: DC
Start: 2019-06-25 — End: 2019-07-11
  Administered 2019-06-25 – 2019-07-11 (×17): 50 via ORAL
  Filled 2019-06-24 (×17): qty 1

## 2019-06-24 MED ORDER — VH VANCOMYCIN THERAPY PLACEHOLDER
Status: DC
Start: 2019-06-24 — End: 2019-06-26

## 2019-06-24 MED ORDER — SALINE NASAL SPRAY 0.65 % NA SOLN
2.00 | NASAL | Status: DC | PRN
Start: 2019-06-24 — End: 2019-07-11
  Administered 2019-06-24 – 2019-07-10 (×4): 2 via NASAL
  Filled 2019-06-24: qty 44

## 2019-06-24 MED ORDER — ALBUTEROL SULFATE (2.5 MG/3ML) 0.083% IN NEBU
2.5000 mg | INHALATION_SOLUTION | RESPIRATORY_TRACT | Status: DC | PRN
Start: 2019-06-24 — End: 2019-07-11
  Administered 2019-06-24 – 2019-07-10 (×8): 2.5 mg via RESPIRATORY_TRACT
  Filled 2019-06-24 (×8): qty 3

## 2019-06-24 MED ORDER — PREDNISONE 5 MG PO TABS
5.0000 mg | ORAL_TABLET | Freq: Every morning | ORAL | Status: DC
Start: 2019-06-25 — End: 2019-07-11
  Administered 2019-06-25 – 2019-07-11 (×17): 5 mg via ORAL
  Filled 2019-06-24 (×17): qty 1

## 2019-06-24 MED ORDER — VANCOMYCIN HCL 750 MG IV SOLR
750.0000 mg | INTRAVENOUS | Status: DC
Start: 2019-06-24 — End: 2019-06-26
  Administered 2019-06-24 – 2019-06-25 (×2): 750 mg via INTRAVENOUS
  Filled 2019-06-24 (×2): qty 0.75

## 2019-06-24 MED ORDER — SODIUM CHLORIDE (PF) 0.9 % IJ SOLN
3.0000 mL | Freq: Three times a day (TID) | INTRAMUSCULAR | Status: DC
Start: 2019-06-24 — End: 2019-07-11
  Administered 2019-06-24 – 2019-07-11 (×50): 3 mL via INTRAVENOUS
  Filled 2019-06-24 (×15): qty 10

## 2019-06-24 MED ORDER — KETOROLAC TROMETHAMINE 30 MG/ML IJ SOLN
15.0000 mg | Freq: Four times a day (QID) | INTRAMUSCULAR | Status: AC | PRN
Start: 2019-06-24 — End: 2019-06-27
  Administered 2019-06-24 – 2019-06-26 (×3): 15 mg via INTRAVENOUS
  Filled 2019-06-24 (×3): qty 1

## 2019-06-24 MED ORDER — SERTRALINE HCL 50 MG PO TABS
25.0000 mg | ORAL_TABLET | Freq: Every day | ORAL | Status: DC
Start: 2019-06-25 — End: 2019-06-26
  Administered 2019-06-25 – 2019-06-26 (×2): 25 mg via ORAL
  Filled 2019-06-24 (×2): qty 1

## 2019-06-24 MED ORDER — ALPRAZOLAM 0.25 MG PO TABS
0.1250 mg | ORAL_TABLET | Freq: Four times a day (QID) | ORAL | Status: DC | PRN
Start: 2019-06-24 — End: 2019-07-11
  Administered 2019-06-24 – 2019-07-10 (×15): 0.125 mg via ORAL
  Filled 2019-06-24 (×17): qty 1

## 2019-06-24 MED ORDER — SODIUM CHLORIDE 0.9 % IV MBP
2.2500 g | Freq: Four times a day (QID) | INTRAVENOUS | Status: DC
Start: 2019-06-24 — End: 2019-06-26
  Administered 2019-06-24 – 2019-06-26 (×6): 2.25 g via INTRAVENOUS
  Filled 2019-06-24 (×7): qty 2.25

## 2019-06-24 MED ORDER — ALLOPURINOL 100 MG PO TABS
300.0000 mg | ORAL_TABLET | Freq: Every day | ORAL | Status: DC
Start: 2019-06-25 — End: 2019-07-11
  Administered 2019-06-25 – 2019-07-11 (×17): 300 mg via ORAL
  Filled 2019-06-24 (×17): qty 3

## 2019-06-24 MED ORDER — OXYCODONE-ACETAMINOPHEN 5-325 MG PO TABS
1.0000 | ORAL_TABLET | Freq: Three times a day (TID) | ORAL | Status: DC | PRN
Start: 2019-06-24 — End: 2019-07-04
  Administered 2019-06-24 – 2019-07-01 (×9): 1 via ORAL
  Filled 2019-06-24 (×9): qty 1

## 2019-06-24 MED ORDER — METOPROLOL SUCCINATE ER 50 MG PO TB24
100.0000 mg | ORAL_TABLET | Freq: Every day | ORAL | Status: DC
Start: 2019-06-25 — End: 2019-07-11
  Administered 2019-06-25 – 2019-07-11 (×16): 100 mg via ORAL
  Filled 2019-06-24 (×17): qty 2

## 2019-06-24 MED ORDER — COLLAGENASE 250 UNIT/GM EX OINT
TOPICAL_OINTMENT | CUTANEOUS | Status: DC
Start: 2019-06-24 — End: 2019-07-03
  Filled 2019-06-24: qty 30

## 2019-06-24 MED ORDER — TRAMADOL HCL 50 MG PO TABS
50.0000 mg | ORAL_TABLET | Freq: Four times a day (QID) | ORAL | Status: DC | PRN
Start: 2019-06-24 — End: 2019-06-30

## 2019-06-24 MED ORDER — APIXABAN 5 MG PO TABS
5.0000 mg | ORAL_TABLET | Freq: Two times a day (BID) | ORAL | Status: DC
Start: 2019-06-24 — End: 2019-07-11
  Administered 2019-06-24 – 2019-07-11 (×34): 5 mg via ORAL
  Filled 2019-06-24 (×34): qty 1

## 2019-06-24 MED ORDER — APIXABAN 5 MG PO TABS
5.0000 mg | ORAL_TABLET | Freq: Two times a day (BID) | ORAL | Status: DC
Start: 2019-06-24 — End: 2019-06-24

## 2019-06-24 MED ORDER — FLUOROMETHOLONE 0.1 % OP SUSP
1.0000 [drp] | Freq: Four times a day (QID) | OPHTHALMIC | Status: DC
Start: 2019-06-24 — End: 2019-06-30
  Administered 2019-06-24 – 2019-06-28 (×7): 1 [drp] via OPHTHALMIC
  Filled 2019-06-24: qty 5

## 2019-06-24 MED ORDER — SODIUM CHLORIDE (PF) 0.9 % IJ SOLN
0.4000 mg | INTRAMUSCULAR | Status: DC | PRN
Start: 2019-06-24 — End: 2019-07-11

## 2019-06-24 MED ORDER — LATANOPROST 0.005 % OP SOLN
1.0000 [drp] | Freq: Every evening | OPHTHALMIC | Status: DC
Start: 2019-06-24 — End: 2019-07-11
  Administered 2019-06-24 – 2019-07-10 (×17): 1 [drp] via OPHTHALMIC
  Filled 2019-06-24: qty 1

## 2019-06-24 MED ORDER — OXYCODONE HCL ER 10 MG PO T12A
10.0000 mg | EXTENDED_RELEASE_TABLET | Freq: Two times a day (BID) | ORAL | Status: DC
Start: 2019-06-24 — End: 2019-07-03
  Administered 2019-06-24 – 2019-07-03 (×18): 10 mg via ORAL
  Filled 2019-06-24 (×18): qty 1

## 2019-06-24 MED ORDER — FUROSEMIDE 10 MG/ML IJ SOLN
40.0000 mg | Freq: Every day | INTRAMUSCULAR | Status: DC
Start: 2019-06-25 — End: 2019-07-11
  Administered 2019-06-25 – 2019-07-11 (×17): 40 mg via INTRAVENOUS
  Filled 2019-06-24 (×17): qty 4

## 2019-06-24 MED ORDER — VH POTASSIUM CHLORIDE CRYS ER 10 MEQ PO TBCR (WRAP)
10.0000 meq | EXTENDED_RELEASE_TABLET | Freq: Two times a day (BID) | ORAL | Status: DC
Start: 2019-06-24 — End: 2019-06-26
  Administered 2019-06-24 – 2019-06-26 (×4): 10 meq via ORAL
  Filled 2019-06-24 (×4): qty 1

## 2019-06-24 MED ORDER — POLYETHYLENE GLYCOL 3350 17 G PO PACK
17.0000 g | PACK | Freq: Every day | ORAL | Status: DC
Start: 2019-06-25 — End: 2019-07-11
  Administered 2019-07-05 – 2019-07-10 (×4): 17 g via ORAL
  Filled 2019-06-24 (×17): qty 1

## 2019-06-24 NOTE — Progress Notes (Signed)
RT was called for a nebulizer treatment for patient being dyspnic. Patient was in pain as RN and students were in room also changing leg dressings. Patient has anxiety as noted when previously a patient. Post nebulizer patient stated that her breathing was no better. I feel much of this is anxiety related, RN notified.

## 2019-06-24 NOTE — Progress Notes (Signed)
Pt complained of chest pain described as heaviness. Pt was very anxious. HR 126. EKG done which showed Afib which she is in in chronically. O2 sat 98% on 4l/m via NC. Pt then thought a breathing treatment would help. RT gave neb tx with relief of symptoms. Will continue to monitor.

## 2019-06-24 NOTE — Progress Notes (Signed)
Pt has bloody nose this morning. She stated the inside of her nose is very dry. She remains on O2 at 4L/m. Water bottle applied to O2 for hydration. Will continue to monitor.

## 2019-06-24 NOTE — Progress Notes (Signed)
Patient moving to transitional care.        06/24/19 1226   Discharge Disposition   Patient preference/choice provided? Yes   Physical Discharge Disposition Other (Comment)  (transitional care)

## 2019-06-24 NOTE — Discharge Readmit Summary (Signed)
VHS Standard Discharge/READMIT Summary   [name]  [facility]   Patient Name: [name]   Attending Physician: Graciela Husbands, DO PCP: Graciela Husbands, DO   Date of Admission: 06/20/2019 D/C Date: 06/24/2019   Discharge Diagnoses:     Principal Problem:    Cellulitis of right leg  Active Problems:    Permanent atrial fibrillation    Chronic diastolic CHF (congestive heart failure)    Chronic interstitial lung disease    Chronic respiratory failure with hypoxia, on home O2 therapy    Pulmonary HTN    Chronic venous insufficiency    Stage 3a chronic kidney disease    Severe pain    Venous stasis ulcer of right calf with fat layer exposed without varicose veins    Cellulitis of right lower extremity    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 *      Pending Results and other Significant studies:      None    Hospital Course:      [name] is a 79 y.o. female patient that was admitted on 06/20/2019 she presented here to the hospital emergency room complaining of severe pain in her right leg.  On physical exam she had 3 rather large deep venous stasis ulcers with surrounding erythema and extremely painful skin.  The ulcers were in the distal portion of the saphenous vein distribution of the right leg.  We had seen these in the past.  We had had her in the hospital about a month ago and sent her home with home care but she failed to get better.  The wounds continued.  The pain was intense.  I had received a call from her daughter the day before admission asking me to increase her pain medicine.  There was some talk about putting her in hospice but the woman is 79 years old and other than these ulcers right now she is in pretty good shape.  She does have chronic heart failure which is currently under good control.  She  appears to be euvolemic.  She probably has an asthma condition that may overlap with the heart failure in terms of its symptoms but she is currently getting very few breathing treatments.  She is on supplemental oxygen because of interstitial lung disease.  She wears this continuously at 4 L/min.  She did have a nosebleed today.  She has been on maintenance warfarin which was subtherapeutic with the INR this week.  We added Lovenox 1 mg/kg every 12 hours because of the subtherapeutic INR and was after this addition that she began bleeding from her nose.  Last night she awoke from sleep complaining of dyspnea.  She has been doing this.  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  They did an EKG during the night and there was no serial changes.  She does have chronic atrial fibrillation.  Anyway today we have decided to switch her over to Eliquis at the time of transfer to transitional care.  She is going to transitional care where Zosyn and vancomycin will continue for the cellulitis caused by the ulcers.  We will continue dressing changes daily.  We are using silver sorb and 2 of the clean looking ulcers and the ulcer that is covered with eschar is being treated with Santyl.  We may use a wound VAC as time goes on but the time is not right at this time.  The patient has been at bedrest with her leg elevated during the entire hospitalization.  As time goes on we may get PT/OT to begin working with her but we want to get these wounds under better control before that starts.  This will be a long-term process and I anticipate she may need to be here for 4 to 6 weeks.    Discharge Instructions:          Special discharge instructions:      Recommended Follow-up after eventual discharge from new facility:  No follow-up provider specified.    Orders (Signed & Held) for release in the new facility:                                                                     Discharge/READMIT     No order context     ID  Description Signed By When Reason    [id number] albuterol (PROVENTIL) (2.5 MG/3ML) 0.083% nebulizer solution 2.5 mg-RT - Every 4 hours as needed Graciela Husbands, DO 06/24/19 1207 Physician Will Release    [id number] fluorometholone (FML) 0.1 % ophthalmic suspension 1 drop-4 times daily Graciela Husbands, DO 06/24/19 1207 Physician Will Release    [id number] latanoprost (XALATAN) 0.005 % ophthalmic solution 1 drop-At bedtime Graciela Husbands, DO 06/24/19 1207 Physician Will Release    [id number] metoprolol succinate XL (TOPROL-XL) 24 hr tablet 100 Phineas Douglas, DO 06/24/19 1207 Physician Will Release    [id number] potassium chloride (K-DUR,KLOR-CON) CR tablet 10 mEq-2 times daily Graciela Husbands, DO 06/24/19 1207 Physician Will Release    [id number] Elevate extremity-Until discontinued Graciela Husbands, DO 06/24/19 1207 Physician Will Release    [id number] Vital signs-Every 8 hours Graciela Husbands, DO 06/24/19 1207 Physician Will Release    [id number] Pain Assessment-Every shift Graciela Husbands, DO 06/24/19 1207 Physician Will Release    [id number] Mobility Protocol-Until discontinued Graciela Husbands, DO 06/24/19 1207 Physician Will Release    [id number] RD initiate protocol if applicable-Until discontinued Graciela Husbands, DO 06/24/19 1207 Physician Will Release    [id number] Notify physician-Until discontinued Graciela Husbands, DO 06/24/19 1207 Physician Will Release    [id number] Skin assessment-Daily Graciela Husbands, DO 06/24/19 1207 Physician Will Release    [id number] Oral care-Until discontinued Graciela Husbands, DO 06/24/19 1207 Physician Will Release    [id number] sodium chloride (PF) 0.9 % injection 3 mL-Every 8 hours Graciela Husbands, DO 06/24/19 1207 Physician Will Release    [id number] Encourage fluids-Continuous Graciela Husbands, DO 06/24/19 1207 Physician Will Release    [id number] Nursing communication-Until discontinued Graciela Husbands, DO 06/24/19 1207 Physician Will Release    [id number] Contact primary attending physician on morning rounds-Until discontinued Graciela Husbands,  DO 06/24/19 1207 Physician Will Release    [id number] naloxone Jonelle Sports) injection 0.4 mg-As needed Graciela Husbands, DO 06/24/19 1207 Physician Will Release    [id number] Oxygen-Adult Low Flow Nasal Cannula (6lpm or less) Target SpO2: 92%-Continuous Graciela Husbands, DO 06/24/19 1207 Physician Will Release    [id number] lactobacillus species (BIO-K PLUS) capsule 50 Billion CFU-Daily Graciela Husbands, DO 06/24/19 1207 Physician Will Release    [id number] Bed rest-Until discontinued Graciela Husbands, DO 06/24/19 1207 Physician Will Release  [id number] vancomycin therapy placeholder-See admin instructions Graciela Husbands, DO 06/24/19 1207 Physician Will Release    [id number] vancomycin (VANCOCIN) 750 mg in sodium chloride 0.9 % 250 mL IVPB (vial-mate)-Every 24 hours Graciela Husbands, DO 06/24/19 1207 Physician Will Release    [id number] piperacillin-tazobactam (ZOSYN) 2.25 g in sodium chloride 0.9 % 100 mL IVPB mini-bag plus-Every 6 hours Graciela Husbands, DO 06/24/19 1207 Physician Will Release    [id number] Basic Metabolic Panel-Morning Draw x1 Graciela Husbands, DO 06/24/19 1207 Physician Will Release    [id number] allopurinol (ZYLOPRIM) tablet 300 Phineas Douglas, DO 06/24/19 1207 Physician Will Release    [id number] oxyCODONE (OxyCONTIN) 12 hr tablet 10 mg-Every 12 hours scheduled Graciela Husbands, DO 06/24/19 1207 Physician Will Release    [id number] oxyCODONE-acetaminophen (PERCOCET) 5-325 MG per tablet 1 tablet-Every 8 hours PRN Graciela Husbands, DO 06/24/19 1207 Physician Will Release    [id number] Diet regular Na restriction, if any: 2 GM NA; Patient preferences: PATIENT CANNOT EAT RICE.-Diet effective now Graciela Husbands, DO 06/24/19 1207 Physician Will Release    [id number] Weigh patient-Daily Graciela Husbands, DO 06/24/19 1207 Physician Will Release    [id number] polyethylene glycol [facility]) packet [address] Graciela Husbands, DO 06/24/19 1207 Physician Will Release    [id number] sertraline (ZOLOFT) tablet 25 mg-Daily Graciela Husbands, DO 06/24/19 1207 Physician Will Release     [id number] traMADol (ULTRAM) tablet 50 mg-Every 6 hours PRN Graciela Husbands, DO 06/24/19 1207 Physician Will Release    [id number] ketorolac (TORADOL) injection 15 mg-Every 6 hours PRN Graciela Husbands, DO 06/24/19 1207 Physician Will Release    [id number] predniSONE (DELTASONE) tablet 5 mg-Every morning with breakfast Graciela Husbands, DO 06/24/19 1207 Physician Will Release    [id number] furosemide (LASIX) injection 40 mg-Daily Graciela Husbands, DO 06/24/19 1207 Physician Will Release    [id number] Wound Care Instructions-Daily at 1000 Graciela Husbands, DO 06/24/19 1207 Physician Will Release    [id number] Wound Care Instructions-Daily at 1000 Graciela Husbands, DO 06/24/19 1207 Physician Will Release    [id number] collagenase (SANTYL) ointment-Every 24 hours Graciela Husbands, DO 06/24/19 1207 Physician Will Release    [id number] Vancomycin, trough-Once Graciela Husbands, DO 06/24/19 1207 Physician Will Release    [id number] apixaban Everlene Balls) tablet 5 mg-Every 12 hours Graciela Husbands, DO 06/24/19 1207 Physician Will Release    [id number] NO CPR - Support OK-Continuous Graciela Husbands, DO 06/24/19 1207 Physician Will Release                Discharge Day Exam (06/24/2019):     Blood pressure 128/68, pulse (!) 55, temperature (!) 95.9 F (35.5 C), temperature source Axillary, resp. rate 20, height 1.524 m (5'), weight 70.4 kg (155 lb 3.3 oz), SpO2 98 %.    Labs and Imaging:  Recent Labs and Imaging are available in the Electronic Health Record.      Graciela Husbands, DO         06/24/19 12:07 PM   MRN: [id number]                                      CSN: [id number] DOB: 11/20/1940

## 2019-06-24 NOTE — Progress Notes (Signed)
Patient rang call bell and stated that she could not breathe. This RN went in to assess and found dried blood clogging the left nostril. Ocean nasal spray and normal saline used to loosen dried blood and remove from nostril. Patient voiced "I can breathe much better now. Thank you so much". She did have a small nose bleed afterwards but is under control now.

## 2019-06-24 NOTE — Progress Notes (Signed)
79 yo female who admits to transitional care with diagnosis of cellulitis of the right lower leg. Per chart review from yesterday, "Upon arrival, patient resting in bed awaiting breakfast. Patient is noted to be alert and oriented x 4 during our visit. She states that she currently lives in a 1 story home with a ramped entrance. She states that her daughter and son-in-law live with her(she owns the home). Patient voices being able to bathe, dress and toilet herself. She states that her son-in-law(Jeff) does a majority of the house cleaning, laundry, shopping, cooking and transportation. Her daughter is also available to help as needed but she voices that she works full time outside of the home. Patient denies being able to drive at this time. She states that family should be able to transport her home at time of discharge. She is ambulatory with the use of a FWW. She also has a SPC, BSC(she does not use), hospital bed(she voices to be uncomfortable), nebulizer and oxygen with portability. She states that she she gets her oxygen supplies through [employer]. Patient is currently receiving home health through [facility]. She states that a nurse visits her 3 times weekly. She states she no longer gets therapy and does not feel that she needs those services. Patient is receptive to resuming [facility] at time of discharge. [facility] notified of patient's admission. We discussed possible need for TC for extended IV needs. Patient is receptive to this at [facility], if needed. Patient verifies that she has Medicare and FEP [employer]. She voices RX coverage and using [facility] for her RX needs. Dr. Amada Jupiter was confirmed as her PCP with last visit being 06/11/19. CM will continue to assess needs and intervene as indicated." CM did talk with patient regarding transitional care handbook. Explained to her that since she has not been out of the hospital for 60 days, her days have not been able to reset and she currently  has 91/100 SNF days available. Days 1-20 are covered at 100% and days 21-100 are covered at 80% with a copay of about $186 a day prior to any secondary coverage. She verbalized understanding. CM confirmed with her that she wishes to continue with [facility]. At this time she is agreeable to PT/OT coming in and once she feels like their services aren't needed anymore she states that she will let them know. While in the room, patient complained that she was having a hard time breathing due to her nose. She states that it had been bleeding earlier in the shift and she can't breath because of it. CM informed her that I would let her primary RN know. CM did follow up with Shanda Bumps, patients primary RN and Langley Gauss NP. At present, all questions have been answered. CM will continue to follow and intervene as necessary.        06/24/19 1640   Patient Type   Within 30 Days of Previous Admission? Yes  (05/04/19-05/21/19 PMH)   Healthcare Decisions   Interviewed: Patient   Orientation/Decision Making Abilities of Patient Alert and Oriented x3, able to make decisions   Level of Function 2 Weeks Prior to Admission   Prior level of function Independent with ADLs;Ambulates with assistive device   Level of Function Upon Admission   Current Level of Function Up to chair with assistance;Needs assistance with ADLs;Ambulates with assistive device   Prior to admission   Type of Residence Private residence   Home Layout One level;Ramped entrance  Have running water, electricity, heat, etc? Yes   Living Arrangements Children   How do you get to your MD appointments? family/friends   How do you get your groceries? family   Who fixes your meals? family   Who does your laundry? family   Who picks up your prescriptions? family   DME Currently at Home ADL- 3-in-1 Bedside Commode;Walker, Front Wheel;Hospital Bed;Portable O2 Tank;Home O2;Cane, Single Point   Home Care/Community Services Home Health   Name of Agency [facility]    Frequency of services 3 times a week   Adult Management consultant (APS) involved? No   Discharge Planning   Support Systems Children;Family members   Patient expects to be discharged to: home   Anticipated [place] plan discussed with: Same as interviewed   Does the patient have perscription coverage? Yes   Consults/Providers   Correct PCP listed in Epic? Yes   Family and PCP   PCP on file was verified as the current PCP? Yes

## 2019-06-24 NOTE — Progress Notes (Signed)
D5 vancomycin  Indication:Cellulitis Goal: 10-31mcg/ml  Continue Vancomycin 750 mg q 24 h  Trough level drawn on 06/23/19 was therapeutic at 11.37mcg/ml. Next trough is ordered for 2/28. Contact pharmacist if you have any further questions

## 2019-06-24 NOTE — Progress Notes (Signed)
Readmission Risk  [facility] - MEDICAL/SURGICAL   Patient Name: [facility] [name]   Attending Physician: Graciela Husbands, DO   Today's date:   06/24/2019 LOS: 0 days   Expected Discharge Date      Readmission Assessment:                                                              Discharge Planning  ReAdmit Risk Score: 34  Does the patient have perscription coverage?: (P) Yes  Confirmed PCP with Pt: (P) Yes  Confirmed PCP name: (P) Dr. Amada Jupiter  Last PCP Visit Date: (P) 06/11/19  Confirm Transport to F/U Appt.: (P) Self/Private Vehicle/Friend  Anticipated Home Health at [place]: (P) Yes  Name of Home Health Agency Placement: [facility]) [facility]  Referral made for home health RN visit?: (P) Yes  Anticipated Placement at [place]: (P) No  CM Comments: (P) 2/23~Will need [facility] at time of discharge. Plan for PT/OT/SN at this time       IDPA:   Patient Type  Within 30 Days of Previous Admission?: Yes(05/04/19-05/21/19 PMH)  Healthcare Decisions  Interviewed:: Patient  Orientation/Decision Making Abilities of Patient: Alert and Oriented x3, able to make decisions  Prior to admission  Prior level of function: Independent with ADLs, Ambulates with assistive device  Type of Residence: Private residence  Home Layout: One level, Ramped entrance  Have running water, electricity, heat, etc?: Yes  Living Arrangements: Children  How do you get to your MD appointments?: family/friends  How do you get your groceries?: family  Who fixes your meals?: family  Who does your laundry?: family  Who picks up your prescriptions?: family  DME Currently at Home: ADL- 3-in-1 Bedside Commode, Walker, [employer], Hospital Bed, Portable O2 Tank, Home O2, [place], Single Point  Home Care/Community Services: Home Health  Name of Agency: [facility]  Frequency of services: 3 times a week  Adult Management consultant (APS) involved?: No  Discharge Planning  Support Systems: Children, Family members  Patient expects to be  discharged to:: home  Anticipated [place] plan discussed with:: Same as interviewed  Does the patient have perscription coverage?: (P) Yes  Consults/Providers  Correct PCP listed in Epic?: Yes  Family and PCP  PCP on file was verified as the current PCP?: Yes   30 Day Readmission:       Provider Notifications:              06/24/19 1648   Discharge Planning   Does the patient have perscription coverage? Yes   Confirmed PCP with Pt Yes   Confirmed PCP name Dr. Amada Jupiter   Last PCP Visit Date 06/11/19   Confirm Transport to F/U Appt. Self/Private Vehicle/Friend   Anticipated Home Health at [place] Yes   Name of Home Health Agency Placement [facility]   Referral made for home health RN visit? Yes   Anticipated Placement at [place] No   CM Comments 2/23~Will need [facility] at time of discharge. Plan for PT/OT/SN at this time

## 2019-06-24 NOTE — Progress Notes (Signed)
Anticoagulation Progress Report   June 24, 2019  Name: [name], [name] [facility]    Medical Record Number: [id number]  Reason for Treatment: Atrial fibrillation unspecified     INR Range: 2.0-3.0    Date: 2019-06-24 14:51  Location: [facility]    Update Note: Per Epic, patient was admitted to [facility] on Feb. 19th with RLE   pain and venous ulcers. INR was 1.9 on admission. Patient is being   transferred to a swing bed for rehab, antibiotics and wound care. At this   time, Dr. Amada Jupiter is also transferring her to Eliquis. Will archive patient.     Submitted by Lowanda Foster RN

## 2019-06-25 LAB — BASIC METABOLIC PANEL
Anion Gap: 14.4 mMol/L (ref 7.0–18.0)
BUN / Creatinine Ratio: 53.7 Ratio — ABNORMAL HIGH (ref 10.0–30.0)
BUN: 51 mg/dL — ABNORMAL HIGH (ref 7–22)
CO2: 31 mMol/L — ABNORMAL HIGH (ref 20.0–30.0)
Calcium: 9.6 mg/dL (ref 8.5–10.5)
Chloride: 94 mMol/L — ABNORMAL LOW (ref 98–110)
Creatinine: 0.95 mg/dL (ref 0.60–1.20)
EGFR: 57 mL/min/{1.73_m2} — ABNORMAL LOW (ref 60–150)
Glucose: 91 mg/dL (ref 71–99)
Osmolality Calculated: 285 mOsm/kg (ref 275–300)
Potassium: 3.4 mMol/L — ABNORMAL LOW (ref 3.5–5.3)
Sodium: 136 mMol/L (ref 136–147)

## 2019-06-25 MED ORDER — VANCOMYCIN HCL 1 G IV SOLR
1000.00 mg | INTRAVENOUS | Status: DC
Start: 2019-06-25 — End: 2019-06-26

## 2019-06-25 NOTE — Progress Notes (Signed)
Intervention closed automatically

## 2019-06-25 NOTE — Progress Notes (Signed)
D6 vancomycin  Indication:Cellulitis Goal: 10-54mcg/ml  Continue Vancomycin 750 mg q 24 h  Trough level drawn on 06/23/19 was therapeutic at 11.100mcg/ml. Next trough is ordered for 2/28 @ 1900. Contact pharmacist if you have any further questions

## 2019-06-25 NOTE — H&P (Signed)
[name] is an 79 year old with complicated chronic venous stasis disease with several open ulcerations at the base of the saphenous vein distribution on the medial aspect of the right leg above the malleolus.  They are painful, draining, and on admission had evidence of inflammation consistent with cellulitis.  The patient has been started on IV Zosyn and vancomycin.  She is not diabetic.  We are doing daily dressing changes using Santyl and a ulceration covered with eschar that needs to be debrided and using Iodosorb and the other ulcerations which are deepened down into the subcutaneous fat.  The patient developed a nosebleed on 2/23.  She was recently placed on Lovenox because of a subtherapeutic INR for the last 4 days.  After talking with her on 2/23, we are switching her to Eliquis and stopping Lovenox and warfarin.  She has chronic atrial fibrillation, mitral regurgitation, chronic diastolic heart failure complicated by valve disease, and a pacemaker.  She is currently euvolemic in appearance.  On admission she did have a little bit of edema in her legs but it was not bad.  Her case has been complicated by chronic venous insufficiency complicated by heart failure with constant leg swelling over the last several years.  I took her over in the last 6 to 7 months and have worked hard on getting her euvolemic to take off some of the hydrostatic pressure from her legs.  Nonetheless she has a sedentary lifestyle at home with her legs hanging down in a dependent position, aggravating the ulcerations.  She was hospitalized here 4 to 6 weeks ago.  During that time I had surgery see her.  They did not have much to offer in terms of management.    Active Ambulatory Problems     Diagnosis Date Noted   . CHF (congestive heart failure) 11/05/2013   . Permanent atrial fibrillation 11/06/2013   . Hyperkalemia 08/24/2014   . Chronic diastolic CHF (congestive heart failure) 08/25/2014   . On home oxygen therapy 08/25/2014   .  Aortic regurgitation 08/25/2014   . Pacemaker 08/25/2014   . Pacemaker at end of battery life 03/22/2015   . 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 Chronic interstitial lung disease 07/19/2015   . Thoracic aortic aneurysm without rupture 07/19/2015   . Difficulty breathing 04/25/2017   . Chronic respiratory failure with hypoxia, on home O2 therapy 04/26/2017   . Nonobstructive atherosclerosis of coronary artery 04/23/2018   . SSS (sick sinus syndrome) 04/25/2018   . Drug-induced constipation 01/27/2019   . Moderate asthma with acute exacerbation 01/28/2019   . Pulmonary HTN 01/30/2019   . Chronic venous insufficiency 02/13/2019   . Bilateral lower extremity edema 02/13/2019   . Diastolic CHF, acute on chronic 05/05/2019   . Venous stasis ulcer of right ankle with fat layer exposed without varicose veins 05/05/2019   . Stage 3a chronic kidney disease 05/06/2019   . Cellulitis of right leg 05/06/2019   . Hyponatremia 05/11/2019   . Severe pain 06/20/2019   . Venous stasis ulcer of right calf with fat layer exposed without varicose veins 06/20/2019   . Cellulitis of right lower extremity 06/21/2019   . 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 Subtherapeutic international normalized ratio (INR) 06/23/2019     Resolved Ambulatory Problems     Diagnosis Date Noted   . Urinary tract infection with hematuria, site unspecified    . Acute exacerbation of CHF (congestive heart failure) 07/16/2015   .  Nausea 01/27/2019   . Headache 01/27/2019   . Acute gout of right foot 03/01/2019     Past Medical History:   Diagnosis Date   . Aortic aneurysm    . Atrial fibrillation    . Chronic obstructive pulmonary disease    . Coronary artery disease    . Gastroesophageal reflux disease    . Hypertension    . Malignant neoplasm      Current Discharge Medication List      CONTINUE these medications which have NOT CHANGED    Details   albuterol (PROVENTIL) (2.5 MG/3ML) 0.083% nebulizer solution Take 2.5 mg by  nebulization every 6 (six) hours as needed for Wheezing.      allopurinol (ZYLOPRIM) 100 MG tablet Take 1 tablet (100 mg total) by mouth daily For gout prevention  Qty: 30 tablet, Refills: 5      diltiazem (TIAZAC) 240 MG 24 hr capsule Take 240 mg by mouth 2 (two) times daily.         fluorometholone (EFLONE) 0.1 % ophthalmic suspension Place 1 drop into the left eye 4 (four) times daily         fluticasone furoate-vilanterol (BREO ELLIPTA) 100-25 MCG/INH Aerosol Pwdr, Breath Activated Inhale 1 puff into the lungs daily  Qty: 60 each, Refills: 5      furosemide (LASIX) 40 MG tablet Take 1 tablet (40 mg total) by mouth 2 (two) times daily  Qty: 180 tablet, Refills: 3      HYDROcodone-acetaminophen (NORCO) 5-325 MG per tablet Take 1 tablet by mouth every 6 (six) hours as needed for Pain  Qty: 90 tablet, Refills: 0    Comments: Notify patient when ready.      latanoprost (XALATAN) 0.005 % ophthalmic solution Place 1 drop into both eyes nightly         metOLazone (ZAROXOLYN) 2.5 MG tablet Take 1 tablet (2.5 mg total) by mouth daily as needed (if you gain 5 pounds)  Qty: 30 tablet, Refills: 0      metoprolol succinate XL (Toprol XL) 50 MG 24 hr tablet Take 1 tablet (50 mg total) by mouth daily  Qty: 30 tablet, Refills: 11      potassium chloride (K-DUR) 10 MEQ tablet Take 1 tablet (10 mEq total) by mouth daily  Qty: 30 tablet, Refills: 11      predniSONE (DELTASONE) 10 MG tablet Take 1 tablet (10 mg total) by mouth every morning with breakfast  Qty:        rosuvastatin (CRESTOR) 10 MG tablet Take 1 tablet (10 mg total) by mouth daily  Qty: 90 tablet, Refills: 3      spironolactone (ALDACTONE) 25 MG tablet Take 1 tablet (25 mg total) by mouth daily  Qty: 30 tablet, Refills: 11      tiotropium (SPIRIVA RESPIMAT) 2.5 MCG/ACT inhalation spray Inhale 2 puffs into the lungs daily  Qty: 4 g, Refills: 5      warfarin (COUMADIN) 3 MG tablet Take 1 tablet (3 mg total) by mouth daily.  Qty: 30 tablet, Refills: 0           Review of  Systems   Constitutional: Positive for malaise/fatigue and weight loss. Negative for chills and fever.   HENT: Positive for hearing loss and nosebleeds.    Eyes: Negative.    Respiratory:        She has oxygen dependent lung disease.  This probably interstitial lung disease.  She did have asthma at one point.  This  may have been cardiac asthma because of heart failure.  She may have independent reactive airway disease but this has not been a factor during this admission.  I believe it is because she is euvolemic and not in failure.   Cardiovascular: Positive for palpitations and leg swelling. Negative for chest pain and orthopnea.        She has chronic venous stasis with complicated ulcerations.   Gastrointestinal: Negative for abdominal pain, heartburn, nausea and vomiting.   Genitourinary: Negative for dysuria, frequency, hematuria and urgency.   Musculoskeletal: Positive for back pain and joint pain. Negative for falls, myalgias and neck pain.   Skin:        She has chronic facial plethora and telangiectasias.   Neurological: Positive for weakness. Negative for dizziness, tremors and headaches.   Psychiatric/Behavioral: Positive for depression. Negative for memory loss. The patient is nervous/anxious.         She is awakening with what seems like central apnea episodes likely coming from opioids.       On exam she is a chronically ill woman in no serious distress.  She is lying in bed on supplemental oxygen.  She has Kleenex stuffed in her left naris because of a recent nosebleed.  She looks weak and deconditioned.  Neck veins flat  Cardiac rhythm irregular S1-S2 rate around 100  A pacemaker is noted underneath the lateral aspect of the left clavicle  Abdomen soft nontender  Lungs clear  The right lower extremity has 3 significant ulcerations which are extremely tender and are currently bandaged.  Earlier in the day we did examine them.  They look stable.  The antibiotics and dressing changes and elevation are  helping.    Active Hospital Problems    Diagnosis   . Subtherapeutic international normalized ratio (INR)   . Cellulitis of right lower extremity   . 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 Venous stasis ulcer of right calf with fat layer exposed without varicose veins   . Cellulitis of right leg   . Stage 3a chronic kidney disease   . Bilateral lower extremity edema   . Chronic venous insufficiency   . Pulmonary HTN   . Chronic respiratory failure with hypoxia, on home O2 therapy   . Chronic interstitial lung disease   . Chronic diastolic CHF (congestive heart failure)   . Pacemaker   . Permanent atrial fibrillation     Plan: The patient is going to transitional care for further intravenous antibiotic therapy and wound care.  Continue bedrest with elevation for now.  Sooner or later we will need to get physical and Occupational Therapy involved but we need to wait until the ulcers are further along in the healing process.     Electronically signed by Graciela Husbands D.O.

## 2019-06-25 NOTE — Progress Notes (Signed)
Patient had good effect from Xanax given at HS - slept in long naps. Continues to complain of severe pain in right leg. Medicated with PRN pain meds with relief noted. RLE elevated on pillows throughout shift. O2 at 4L/m via NC. Did not request PRN neb tonight. Call bell within reach. Will continue to monitor.

## 2019-06-25 NOTE — PT Plan of Care Note (Signed)
[facility]    [facility]  [address]  [place], [place] [address]    Department of Rehabilitation  [phone]    Donnelle Rubey [facility] CSN: [id number]  @[id number] @ 203/203-A    Physical Therapy General Note    Time: 3:19pm    Attempted to see pt for PT evaluation as ordered by MD however pt c/o SOB with no improvement using breathing strategies. Vitals assessed - see flowsheet - pt appears stable on 4L NCO2. Respiratory therapist entered room to give breathing tx. Will re-attempt again next working day.        Pt/family Education:  ("X" All That Apply)        Benefits of therapy        Effects of immobility        Effects on d/c plan        MD order of therapy x       Other (specify)      Plan: Will follow up next working day.      [name], DPT

## 2019-06-26 DIAGNOSIS — L97919 Non-pressure chronic ulcer of unspecified part of right lower leg with unspecified severity: Secondary | ICD-10-CM

## 2019-06-26 DIAGNOSIS — R234 Changes in skin texture: Secondary | ICD-10-CM

## 2019-06-26 LAB — BASIC METABOLIC PANEL
Anion Gap: 14.4 mMol/L (ref 7.0–18.0)
BUN / Creatinine Ratio: 55.6 Ratio — ABNORMAL HIGH (ref 10.0–30.0)
BUN: 50 mg/dL — ABNORMAL HIGH (ref 7–22)
CO2: 31 mMol/L — ABNORMAL HIGH (ref 20.0–30.0)
Calcium: 9.4 mg/dL (ref 8.5–10.5)
Chloride: 93 mMol/L — ABNORMAL LOW (ref 98–110)
Creatinine: 0.9 mg/dL (ref 0.60–1.20)
EGFR: 61 mL/min/{1.73_m2} (ref 60–150)
Glucose: 87 mg/dL (ref 71–99)
Osmolality Calculated: 283 mOsm/kg (ref 275–300)
Potassium: 3.4 mMol/L — ABNORMAL LOW (ref 3.5–5.3)
Sodium: 135 mMol/L — ABNORMAL LOW (ref 136–147)

## 2019-06-26 MED ORDER — VH POTASSIUM CHLORIDE CRYS ER 10 MEQ PO TBCR (WRAP)
10.0000 meq | EXTENDED_RELEASE_TABLET | Freq: Three times a day (TID) | ORAL | Status: DC
Start: 2019-06-26 — End: 2019-07-11
  Administered 2019-06-26 – 2019-07-11 (×46): 10 meq via ORAL
  Filled 2019-06-26 (×45): qty 1

## 2019-06-26 MED ORDER — CEFTRIAXONE SODIUM 1 G IJ SOLR
1.00 g | Freq: Two times a day (BID) | INTRAMUSCULAR | Status: DC
Start: 2019-06-26 — End: 2019-07-11
  Administered 2019-06-26 – 2019-07-11 (×31): 1 g via INTRAVENOUS
  Filled 2019-06-26 (×31): qty 1000

## 2019-06-26 MED ORDER — SERTRALINE HCL 50 MG PO TABS
50.0000 mg | ORAL_TABLET | Freq: Every day | ORAL | Status: DC
Start: 2019-06-27 — End: 2019-07-04
  Administered 2019-06-27 – 2019-07-04 (×8): 50 mg via ORAL
  Filled 2019-06-26 (×8): qty 1

## 2019-06-26 MED ORDER — ALPRAZOLAM 0.25 MG PO TABS
0.25 mg | ORAL_TABLET | Freq: Once | ORAL | Status: AC
Start: 2019-06-26 — End: 2019-06-26
  Administered 2019-06-26: 15:00:00 0.25 mg via ORAL

## 2019-06-26 NOTE — Progress Notes (Signed)
[name] is awake and alert and time of assessment. She is alert and oriented this morning. She denies any needs or concerns at this time. She did voice having pain, rated 3/10; scheduled Oxycodone given with all morning medications. She is on 4L via NC; SpO2 94%. RLE dressing remains clean, dry and intact; BLE elevated with pillow support. Bed is in the low and locked position with alarm on for safety. Call bell and all frequently used person belongings are within reach.

## 2019-06-26 NOTE — Plan of Care (Signed)
STGs to be met in 1 week:  1. Pt will verbalize 1 exercise/movement that she can do throughout the day to maintain good skin integrity on her sacrum in order to prevent skin breakdown during bedrest;   2. Pt will demonstrate ability to complete established HEP in order to maintain good strength in her LEs during bedrest;       New goals/LTGs to be established once pt no longer on bedrest.

## 2019-06-26 NOTE — Progress Notes (Signed)
[name] is doing okay today.  We took off her leg dressings and closely examined her wounds.  She has a full-thickness wounds involving about 3 x 4.5 cm of her skin with black eschar stuck in the base of one region.  We are going to continue Santyl in this area to dislodge the eschar and we feel that a wound VAC to the area after the eschar sloughs will help accelerate healing.  In the meantime I have stopped Zosyn and vancomycin and placed her on ceftriaxone.  I think this will give [place] equal coverage.  There is no evidence that she has MRSA and so I think ceftriaxone will help.  There is no nosebleeding today.  She did have a breathing treatment.  Her lungs always sound clear and I asked her today if these breathing treatments actually help her.  She thinks they do but I am not so sure.    Vitals:    06/26/19 1025   BP:    Pulse: 84   Resp: 20   Temp:    SpO2: 94%     Lungs are clear  Abdomen soft  There is no lower extremity edema/the wounds are noted.    Results     Procedure Component Value Units Date/Time    Basic Metabolic Panel [[id number]]  (Abnormal) Collected: 06/26/19 0615    Specimen: Plasma Updated: 06/26/19 0729     Sodium 135 mMol/L      Potassium 3.4 mMol/L      Chloride 93 mMol/L      CO2 31.0 mMol/L      Calcium 9.4 mg/dL      Glucose 87 mg/dL      Creatinine 4.09 mg/dL      BUN 50 mg/dL      Anion Gap 81.1 mMol/L      BUN / Creatinine Ratio 55.6 Ratio      EGFR 61 mL/min/1.41m2      Osmolality Calculated 283 mOsm/kg         Active Hospital Problems    Diagnosis   . Subtherapeutic international normalized ratio (INR)   . Cellulitis of right lower extremity   . 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 Venous stasis ulcer of right calf with fat layer exposed without varicose veins   . Cellulitis of right leg   . Stage 3a chronic kidney disease   . Bilateral lower extremity edema   . Chronic venous insufficiency   . Pulmonary HTN   . Chronic respiratory failure with hypoxia, on home O2 therapy    . Chronic interstitial lung disease   . Chronic diastolic CHF (congestive heart failure)   . Pacemaker   . Permanent atrial fibrillation     Plan: I have switch the antibiotic to ceftriaxone we will keep her on that.  We are going to begin physical therapy out of bed.  I would like the wound wrapped with an Ace bandage while she is up with PT.  I do not want any fluid accumulation in that leg.  Pain control seems to be adequate with antibiotic therapy and wound care.  We are also giving her opioids.  She does not seem to be having the central apnea that was a concern over the weekend.    Electronically signed by Graciela Husbands D.O.

## 2019-06-26 NOTE — Progress Notes (Signed)
1900-0700    A&Ox4. Cooperative but anxious. Xanax given once this am. 4L oxygen via NC in use. 1 BM passed last night. Up to [facility] to void w/ 1 assist and walker. Repositioned pt throughout night w/ pillows and EHOB in place on bed. BLE elevated on pillows. Dressing to RLE CDI. IV Toradol given once. Bed alarm in place. Call bell within reach.

## 2019-06-26 NOTE — PT Eval Note (Signed)
[facility]  Department of Rehabilitation Services  [phone]    [facility]    Physical Therapy Transitional Care Evaluation    Joani Cosma [facility]    CSN: [id number]    MEDICAL/SURGICAL   203/203-A    Time of treatment:  Time Calculation  PT Received On: 06/26/19  Start Time: 0926  Stop Time: 0943  Time Calculation (min): 17 min                                                                                Precautions and Contraindications:   Bedrest starting 06/24/19    History of Present Illness:   Medical Diagnosis: Pain, unspecified [R52]  Cellulitis of right lower extremity [L03.115]    Therapy Diagnosis:  Muscle weakness     [name] is a 79 y.o. female admitted on 06/24/2019 with need for continued IV antibiotics due to cellulitis of RLE. PT ordered in order to instruct pt in bed level exercises to prevent loss of strength/function while on bedrest.     Patient Active Problem List   Diagnosis   . CHF (congestive heart failure)   . Permanent atrial fibrillation   . Hyperkalemia   . Chronic diastolic CHF (congestive heart failure)   . On home oxygen therapy   . Aortic regurgitation   . Pacemaker   . Pacemaker at end of battery life   . Diaphragmatic eventration   . Chronic interstitial lung disease   . Thoracic aortic aneurysm without rupture   . Difficulty breathing   . Chronic respiratory failure with hypoxia, on home O2 therapy   . Nonobstructive atherosclerosis of coronary artery   . SSS (sick sinus syndrome)   . Drug-induced constipation   . Moderate asthma with acute exacerbation   . Pulmonary HTN   . Chronic venous insufficiency   . Bilateral lower extremity edema   . Diastolic CHF, acute on chronic   . Venous stasis ulcer of right ankle with fat layer exposed without varicose veins   . Stage 3a chronic kidney disease   . Cellulitis of right leg   . Hyponatremia   . Severe pain   . Venous stasis ulcer of right calf with fat layer exposed without varicose veins   .  Cellulitis of right lower extremity   . 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 Subtherapeutic international normalized ratio (INR)        X-Rays/Tests/Labs:  No results found.      Past Medical/Surgical History:  Past Medical History:   Diagnosis Date   . Aortic aneurysm    . Atrial fibrillation    . Chronic obstructive pulmonary disease    . Coronary artery disease    . Gastroesophageal reflux disease    . Hypertension    . Malignant neoplasm    . Pacemaker    . SSS (sick sinus syndrome)       Past Surgical History:   Procedure Laterality Date   . CARDIAC PACEMAKER PLACEMENT      2002   . HYSTERECTOMY           Social History:   Home Living Arrangements:  Living  Arrangements:Family members(dtr and son-in-law in basement)  Type of Home:House  Home Layout:Ramped entrance, Able to live on main level with bedroom and bathroom    DME available at home:  Assistive devices:Front wheeled walker  Adaptive equipment:unknown   Home O2- 2L    Prior Level of Function:  Household ambulationwith FWW, Modified Independent    Subjective:   "My legs are hurting more," pt stated when seated at EOB.   Patient is agreeable to participation in the therapy session.       Examination of Body Systems (Structures, Function, Activity and Participation):   Observation of patient  Patient is in bed with Bed/chair alarm on, peripheral IV, O2 at 4 liters/minute via nasal cannula in place.    Pain:  At Rest: 6/10  Location: Leg:  right  Interventions: Medication (see eMAR), Repositioned, Relaxation breathing    Vital Signs(Cardiovascular):  See flowsheet between start time and stop times for vitals taken during session     Edema/Skin inspection:  Edema:  No pedal edema noted  No upper extremity edema noted    Skin inspection:  BLE demonstrate hemosiderin staining distally; buttocks/sacrum demonstrates purple discoloration; Distal RLE dressing intact     Posture:  Forward Head:  seated, Rounded Shoulders:   seated    Musculoskeletal Examination:   Range of motion:  BLEs grossly WFLs (B ankle DF limited >25%)         Strength:  BLEs at least 3/5 (resistance not applied for MMT due to c/o moderate pain RLE)    Tone:  WNL BLEs    Functional Mobility:  Bed Mobility:  Rolling to Left:  Modified independence .   HOB elevated, Bed rail used  Rolling to Right:   Modified independence .  HOB elevated, Bed rail used  Supine to Sit:   Modified independence .   HOB elevated, Bed rail used  Sit to Supine:   Modified independence .   HOB elevated    Transfers:  Not tested due to pt currently on bedrest     Locomotion:  Not tested due to pt currently on bedrest    Neurology:   Cognition:   Command following: Follows ALL commands and directions without difficulty  Alertness/Arousal: Appropriate responses to stimuli      Behavior:  Cooperative, Anxious    Motor Planning/Coordination:  Motor Planning:  Intact  Coordination:  Within Normal Limits (WNL)      Sensation:  Patient reporting No Numbness or Paresthesia  Sensation is intact , to light touch BLEs    Balance:   Static Sitting:  Good  Dynamic Sitting:  Good   Standing: not tested due to pt currently on bedrest                 Participation and Activity Tolerance:   Participation effort: Good  Activity Tolerance: Limited by c/o SOB and moderate pain RLE    Treatment Interventions this session:   Evaluation    Education Provided:   TOPICS: role of physical therapy, plan of care, goals of therapy and benefits of activity, pursed lip breathing, breathing strategies    Learner educated: Patient  Method: Explanation and Demonstration  Response to education: Verbalized understanding and Demonstrated understanding    Team Communication:   PT communicated with: Respiratory therapist  PT communicated regarding: request for breathing tx by pt  Whiteboard updated: Yes    PT/PTA communication: via written note and verbal communication as needed.    Supine, in bed, Needs  in reach, Bed/chair  alarm set and RLE elevated on pillows      Clinical Presentation and Decision Making:   PT Assessment:  Iram Astorino was admitted 06/24/2019 with need for continued IV antibiotics due to cellulitis of RLE. PT ordered in order to instruct pt in bed level exercises to prevent loss of strength/function while on bedrest. This morning pt appears in good spirits although a bit anxious about her difficulty breathing. She was able to demonstrate ability to roll in bed and perform supine <> sit with MOD I using hospital bed features. She was able to elevated her LEs against gravity for several repetitions and stated she does leg exercises every day at home. Due to pt remaining on bedrest, PT POC will focus on instructing pt in an HEP in order to maintain/improve strength/function of LEs and to maintain good skin integrity on her buttocks/sacrum.     Patient presenting with the following PT Impairments: decreased strength     Due to the presence of a limited number of treatment options and several comorbidities or personal factors that affect performance, as well as patient's stable and/or uncomplicated characteristics, modifications were NOT necessary to complete evaluation when examining total of 3 elements (includes body structures and functions, activity limitations and/or participation restriction) determines the degree of complexity for this patient is LOW        Rehabilitation Potential:  Good for stated goals 2    Discussed risk, benefits and Plan of Care with: Patient    Goals:   STGs to be met in 1 week:  1. Pt will verbalize 1 exercise/movement that she can do throughout the day to maintain good skin integrity on her sacrum in order to prevent skin breakdown during bedrest; NEW  2. Pt will demonstrate ability to complete established HEP in order to maintain good strength in her LEs during bedrest; NEW      New goals/LTGs to be established once pt no longer on bedrest.    Development of Plan of Care:    Treatment/interventions: Exercise, Therapeutic Activity     Treatment Frequency:  3-5 days/week for 1 week         DISCHARGE RECOMMENDATIONS   DME recommended for Discharge:   TBD closer to discharge        Discharge Recommendations:   To be determined closer to discharge           Alvino Chapel, DPT

## 2019-06-27 LAB — BASIC METABOLIC PANEL
Anion Gap: 14.9 mMol/L (ref 7.0–18.0)
BUN / Creatinine Ratio: 48 Ratio — ABNORMAL HIGH (ref 10.0–30.0)
BUN: 48 mg/dL — ABNORMAL HIGH (ref 7–22)
CO2: 31 mMol/L — ABNORMAL HIGH (ref 20.0–30.0)
Calcium: 9.8 mg/dL (ref 8.5–10.5)
Chloride: 97 mMol/L — ABNORMAL LOW (ref 98–110)
Creatinine: 1 mg/dL (ref 0.60–1.20)
EGFR: 53 mL/min/{1.73_m2} — ABNORMAL LOW (ref 60–150)
Glucose: 116 mg/dL — ABNORMAL HIGH (ref 71–99)
Osmolality Calculated: 291 mOsm/kg (ref 275–300)
Potassium: 3.9 mMol/L (ref 3.5–5.3)
Sodium: 139 mMol/L (ref 136–147)

## 2019-06-27 NOTE — Progress Notes (Signed)
Received a call from [name], patient's daughter. She was inquiring about the need to call patient's secondary insurance to get authorization for her stay. Informed her that Medicare should cover the first 20 days at 100%(patient currently has 11 days remaining) and her secondary FEP BCBS should cover an additional 10 days at 100%. Informed [name] that if patient would need to stay past these days then she would likely be responsible for the $186 copay/day. Informed [name] that she does not need to call the secondary insurance for authorization but if she is concerned regarding their coverage of copay, she could call the number on their card to inquire. Understanding voiced by [name]. [name] voiced that CM could give her a call if patient has any needs or we have any questions. Clarified with [name] that since her sister lives with patient- [name] should be our primary contact. No further questions/concerns voiced at this time.

## 2019-06-27 NOTE — Progress Notes (Signed)
Patient resting in bed, call bell and personal belongings within reach. Bed alarm on for patient safety. Patient C/O moderately to severe pain in right leg. Scheduled OxyContin given and Percocet given at a later time.

## 2019-06-27 NOTE — Progress Notes (Signed)
Patient resting in bed at time of assessment. She denies any needs or concerns at this time. She voiced having pain 3/10 to the RLE; medicated with scheduled oxycodone along with all morning medications. Bed is in the low and locked position with alarm on for safety. Call bell and all personal belongings are within reach. RLE dressing remains in place and is clean, dry and intact. 4L via NC in place.

## 2019-06-28 LAB — BASIC METABOLIC PANEL
Anion Gap: 10.6 mMol/L (ref 7.0–18.0)
BUN / Creatinine Ratio: 53.1 Ratio — ABNORMAL HIGH (ref 10.0–30.0)
BUN: 43 mg/dL — ABNORMAL HIGH (ref 7–22)
CO2: 34 mMol/L — ABNORMAL HIGH (ref 20.0–30.0)
Calcium: 9.8 mg/dL (ref 8.5–10.5)
Chloride: 95 mMol/L — ABNORMAL LOW (ref 98–110)
Creatinine: 0.81 mg/dL (ref 0.60–1.20)
EGFR: 69 mL/min/{1.73_m2} (ref 60–150)
Glucose: 99 mg/dL (ref 71–99)
Osmolality Calculated: 283 mOsm/kg (ref 275–300)
Potassium: 3.6 mMol/L (ref 3.5–5.3)
Sodium: 136 mMol/L (ref 136–147)

## 2019-06-28 MED ORDER — KETOROLAC TROMETHAMINE 30 MG/ML IJ SOLN
15.00 mg | Freq: Four times a day (QID) | INTRAMUSCULAR | Status: AC | PRN
Start: 2019-06-28 — End: 2019-07-03
  Administered 2019-06-28 – 2019-06-30 (×2): 15 mg via INTRAVENOUS
  Filled 2019-06-28 (×2): qty 1

## 2019-06-28 NOTE — Progress Notes (Signed)
06/28/19 1439   Patient Assessment   Status Completed   RT Priority 3   Pulmonary History Asthma;Other (Comment)  (ILD)   Airway type Other (Comment)  (natural)   Surgical Status None  (in past 30 days)   Mobility Ambulatory with or without assistance   CXR no recent in past 72 hrs   Cough Non-productive;Dry;Spontaneous   Heart Rate 76   Resp Rate 18   Bilateral Breath Sounds Clear;Diminished   Location Specific No   Home regimen   Home Treatments Albuterol PRN, Breo, Spiriva   Home Oxygen 4L    Oxygen Therapy   SpO2 95 %   O2 Device Nasal cannula   O2 Flow Rate (L/min) 4 L/min   Criteria for Therapy   Respiratory Medications Other (please specify)  (Albuterol given as PRN)   Oxygen Mild/Moderate: O2 < or equal to 5 lpm NC to maintain SpO2> or equal to 90%  (patient on home oxygen)   Severity Index   Severity Index Other (Coments)  (on home regimen, oxygen and nebs)   Outcomes   Respiratory medication Patient is on maintenance or home regimen   Oxygen goals Other (Comment)  (patient on home oxygen at 4L)   Recommendations   Respiratory Medications Continue current therapy   Performing Departments   O2 Device performing department formula [id number]   Resp assess adult performing department formula [id number]   Assessment completed on patient at this time.

## 2019-06-28 NOTE — Progress Notes (Signed)
Patient resting in bed, call bell and personal belongings within reach. Bed alarm on for patient safety. Patient c/o moderately to severe pain in right leg. Scheduled OxyContin given. Patient on 4L via NC

## 2019-06-29 LAB — BASIC METABOLIC PANEL
Anion Gap: 14.3 mMol/L (ref 7.0–18.0)
BUN / Creatinine Ratio: 52.3 Ratio — ABNORMAL HIGH (ref 10.0–30.0)
BUN: 45 mg/dL — ABNORMAL HIGH (ref 7–22)
CO2: 31 mMol/L — ABNORMAL HIGH (ref 20.0–30.0)
Calcium: 10 mg/dL (ref 8.5–10.5)
Chloride: 93 mMol/L — ABNORMAL LOW (ref 98–110)
Creatinine: 0.86 mg/dL (ref 0.60–1.20)
EGFR: 64 mL/min/{1.73_m2} (ref 60–150)
Glucose: 89 mg/dL (ref 71–99)
Osmolality Calculated: 279 mOsm/kg (ref 275–300)
Potassium: 4.3 mMol/L (ref 3.5–5.3)
Sodium: 134 mMol/L — ABNORMAL LOW (ref 136–147)

## 2019-06-29 NOTE — Progress Notes (Signed)
NURSE NOTE SUMMARY  [facility] - MEDICAL/SURGICAL   Patient Name: [name]   Attending Physician: Graciela Husbands, DO   Today's date:   06/29/2019 LOS: 5 days   Shift Summary:                                                              1951: Patient assisted to [facility] by staff x1 using walker. Urine occurence x1. Dusky non-blanchable redness spot noted to bilateral buttocks. Barrier wiped used. Pain at comfort level 3/10. Continues on 4L O2.   0533: [name] rested throughout shift. No complaints.    Provider Notifications:        Rapid Response Notifications:  Mobility:      PMP Activity: Step 6 - Walks in Room (06/29/2019  7:49 PM)     Weight tracking:  Family Dynamic:   Last 3 Weights for the past 72 hrs (Last 3 readings):   Weight   06/29/19 0754 81.5 kg (179 lb 10.8 oz)   06/28/19 0659 81.8 kg (180 lb 5.4 oz)             Last Bowel Movement   Last BM Date: 06/28/19

## 2019-06-30 MED ORDER — GABAPENTIN 100 MG PO CAPS
100.00 mg | ORAL_CAPSULE | Freq: Three times a day (TID) | ORAL | Status: DC
Start: 2019-06-30 — End: 2019-07-04
  Administered 2019-06-30 – 2019-07-04 (×12): 100 mg via ORAL
  Filled 2019-06-30 (×12): qty 1

## 2019-06-30 MED ORDER — FLUOROMETHOLONE 0.1 % OP SUSP
1.0000 [drp] | Freq: Every day | OPHTHALMIC | Status: DC
Start: 2019-06-30 — End: 2019-07-11
  Administered 2019-06-30 – 2019-07-10 (×11): 1 [drp] via OPHTHALMIC

## 2019-06-30 MED ORDER — MORPHINE SULFATE (CONCENTRATE) 100 MG/5ML PO SOLN
10.00 mg | ORAL | Status: DC | PRN
Start: 2019-06-30 — End: 2019-06-30

## 2019-06-30 NOTE — Progress Notes (Signed)
Readmission Risk  [facility] - MEDICAL/SURGICAL   Patient Name: [facility] [name]   Attending Physician: Graciela Husbands, DO   Today's date:   06/30/2019 LOS: 6 days   Expected Discharge Date      Readmission Assessment:                                                              Discharge Planning  ReAdmit Risk Score: 31  Does the patient have perscription coverage?: Yes  Confirmed PCP with Pt: Yes  Confirmed PCP name: Dr. Amada Jupiter  Last PCP Visit Date: 06/11/19  Confirm Transport to F/U Appt.: Self/Private Vehicle/Friend  Anticipated Home Health at [place]: Yes  Name of Home Health Agency Placement: [facility]  Referral made for home health RN visit?: Yes  Anticipated Placement at [place]: No  CM Comments: (P) 3/1~? 1 week then home with [facility]. [facility] PT/OT/SN       IDPA:   Patient Type  Within 30 Days of Previous Admission?: Yes(05/04/19-05/21/19 [facility])  Healthcare Decisions  Interviewed:: Patient  Orientation/Decision Making Abilities of Patient: Alert and Oriented x3, able to make decisions  Prior to admission  Prior level of function: Independent with ADLs, Ambulates with assistive device  Type of Residence: Private residence  Home Layout: One level, Ramped entrance  Have running water, electricity, heat, etc?: Yes  Living Arrangements: Children  How do you get to your MD appointments?: family/friends  How do you get your groceries?: family  Who fixes your meals?: family  Who does your laundry?: family  Who picks up your prescriptions?: family  DME Currently at Home: ADL- 3-in-1 Bedside Commode, Walker, [employer], Hospital Bed, Portable O2 Tank, Home O2, Point, Single Point  Home Care/Community Services: Home Health  Name of Agency: [facility]  Frequency of services: 3 times a week  Adult Management consultant (APS) involved?: No  Discharge Planning  Support Systems: Children, Family members  Patient expects to be discharged to:: home  Anticipated [place] plan discussed with:: Same as  interviewed  Does the patient have perscription coverage?: Yes  Consults/Providers  Correct PCP listed in Epic?: Yes  Family and PCP  PCP on file was verified as the current PCP?: Yes   30 Day Readmission:       Provider Notifications:            06/30/19 1321   Discharge Planning   Does the patient have perscription coverage? Yes   Confirmed PCP with Pt Yes   Confirmed PCP name Dr. Amada Jupiter   Last PCP Visit Date 06/11/19   Confirm Transport to F/U Appt. Self/Private Vehicle/Friend   Anticipated Home Health at [place] Yes   Name of Home Health Agency Placement [facility]   Referral made for home health RN visit? Yes   Anticipated Placement at  No   CM Comments 3/1~? 1 week then home with [facility]. [facility] PT/OT/SN

## 2019-06-30 NOTE — UM Notes (Signed)
[facility] Utilization Management Review Sheet    Facility :  [facility]    NAME: [name]  MR#: [id number]    CSN#: [id number]    ROOM: 203/203-A AGE: 79 y.o.    ADMIT DATE AND TIME: 06/24/2019 12:21 PM      PATIENT CLASS: TC/Swing Bed<>06/24/2019 12:51 PM    ATTENDING PHYSICIAN: Graciela Husbands, DO  PAYOR:Payor: MEDICARE / Plan: MEDICARE PART A AND B / Product Type: Medicare /       AUTH #:     DIAGNOSIS: Active Problems:    Permanent atrial fibrillation    Chronic diastolic CHF (congestive heart failure)    Pacemaker    Chronic interstitial lung disease    Chronic respiratory failure with hypoxia, on home O2 therapy    Pulmonary HTN    Chronic venous insufficiency    Bilateral lower extremity edema    Stage 3a chronic kidney disease    Cellulitis of right leg    Venous stasis ulcer of right calf with fat layer exposed without varicose veins    Cellulitis of right lower extremity    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    Subtherapeutic international normalized ratio (INR)        HISTORY:   Past Medical History:   Diagnosis Date    Aortic aneurysm     Atrial fibrillation     Chronic obstructive pulmonary disease     Coronary artery disease     Gastroesophageal reflux disease     Hypertension     Malignant neoplasm     Pacemaker     SSS (sick sinus syndrome)        DATE OF REVIEW: 06/25/2019    VITALS: BP 117/72    Pulse 88    Temp 97.3 F (36.3 C) (Oral)    Resp 18    Wt 82.1 kg (181 lb)    SpO2 99%    BMI 35.35 kg/m     Active Hospital Problems    Diagnosis    Subtherapeutic international normalized ratio (INR)    Cellulitis of right lower extremity    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 female patient that was admitted on 06/20/2019 she presented here to the hospital emergency room complaining of severe pain in her right leg.  On physical exam she had 3 rather large deep venous stasis ulcers with surrounding erythema and extremely painful skin.  The ulcers were in the distal portion of the saphenous vein distribution of the right leg.  We had seen these in the past.  We had had her in the hospital about a month ago and sent her home with home care but she failed to get better.  The wounds continued.  The pain was intense.  I had received a call from her daughter the day before admission asking me  to increase her pain medicine.  There was some talk about putting her in hospice but the woman is 79 years old and other than these ulcers right now she is in pretty good shape.  She does have chronic heart failure which is currently under good control.  She appears to be euvolemic.  She probably has an asthma condition that may overlap with the heart failure in terms of its symptoms but she is currently getting very few breathing treatments.  She is on supplemental oxygen because of interstitial lung disease.  She wears this continuously at 4 L/min.  She did have a nosebleed today.  She has been on maintenance warfarin which was subtherapeutic with the INR this week.  We added Lovenox 1 mg/kg every 12 hours because of the subtherapeutic INR and was after this addition that she began bleeding from her nose.  Last night she awoke from sleep complaining of dyspnea.  She has been doing this.  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  They did an EKG during the night and there was no serial changes.  She does have chronic atrial fibrillation.  Anyway today we have decided to switch  her over to Eliquis at the time of transfer to transitional care.  She is going to transitional care where Zosyn and vancomycin will continue for the cellulitis caused by the ulcers.  We will continue dressing changes daily.  We are using silver sorb and 2 of the clean looking ulcers and the ulcer that is covered with eschar is being treated with Santyl.  We may use a wound VAC as time goes on but the time is not right at this time.  The patient has been at bedrest with her leg elevated during the entire hospitalization.  As time goes on we may get PT/OT to begin working with her but we want to get these wounds under better control before that starts.  This will be a long-term process and I anticipate she may need to be here for 4 to 6 weeks.      H&P<> Dr Amada Jupiter    79 year old with complicated chronic venous stasis disease with several open ulcerations at the base of the saphenous vein distribution on the medial aspect of the right leg above the malleolus.  They are painful, draining, and on admission had evidence of inflammation consistent with cellulitis.  The patient has been started on IV Zosyn and vancomycin.  She is not diabetic.  We are doing daily dressing changes using Santyl and a ulceration covered with eschar that needs to be debrided and using Iodosorb and the other ulcerations which are deepened down into the subcutaneous fat.  The patient developed a nosebleed on 2/23.  She was recently placed on Lovenox because of a subtherapeutic INR for the last 4 days.  After talking with her on 2/23, we are switching her to Eliquis and stopping Lovenox and warfarin.  She has chronic atrial fibrillation, mitral regurgitation, chronic diastolic heart failure complicated by valve disease, and a pacemaker.  She is currently euvolemic in appearance.  On admission she did have a little bit of edema in her legs but it was not bad.  Her case has been complicated by chronic venous insufficiency complicated by heart  failure with constant leg swelling over the last several years.        Respiratory:        She has oxygen dependent lung disease.  This probably interstitial lung disease.  She did have asthma at one point.  This may have been cardiac asthma because of heart failure.  She may have independent reactive airway disease but this has not been a factor during this admission.  I believe it is because she is euvolemic and not in failure.   Cardiovascular: Positive for palpitations and leg swelling. Negative for chest pain and orthopnea.        She has chronic venous stasis with complicated ulcerations.     Plan: The patient is going to transitional care for further intravenous antibiotic therapy and wound care.  Continue bedrest with elevation for now.  Sooner or later we will need to get physical and Occupational Therapy involved but we need to wait until the ulcers are further along in the healing process.     Electronically signed by Graciela Husbands D.O.        TC/Swing Bed<>     Pt/inr. Bmp,  Wound care<> Cleanse left medial proximal wound with normal saline, dry throughout. Apply santyl to eschar in wound bed. Cover with folded 2 x 2 dressing. DO NOT GET SANTYL on surrounding skin. Apply All Kare to skin surrounding wound for protection. Oxygen 2L pe nc. Consult PT. Rocephin 1gm IV Q12H. Lasix 40mg  IV. Zosyn 2.25gm IV Q6H. Prednisone 5mg  po Q morning w/bkft. Vanc 750mg  IV Q24H. Percocet 5/325mg  1 tab po Q8H prn.           PT<>Goals:   STGs to be met in 1 week:  1. Pt will verbalize 1 exercise/movement that she can do throughout the day to maintain good skin integrity on her sacrum in order to prevent skin breakdown during bedrest; NEW  2. Pt will demonstrate ability to complete established HEP in order to maintain good strength in her LEs during bedrest; NEW      New goals/LTGs to be established once pt no longer on bedrest.    Development of Plan of Care:   Treatment/interventions: Exercise, Therapeutic Activity      Treatment Frequency:  3-5 days/week for 1 week           OT<>          Meets MCG<> [id number]    Lowella Dell RN,CCM/UR    [facility]  [address]  [place], [place] [address]    [facility]: [phone]  FX: [phone]

## 2019-06-30 NOTE — Plan of Care (Signed)
Wound care RLE per order. Pt tolerated well. Continue POC.

## 2019-06-30 NOTE — Patient Care Conference (Signed)
[facility]    INTERDISCIPLINARY CARE PLAN   TEAM CONFERENCE NOTE    Patient: [name] [facility]  Admission Date: 06/24/2019    Date of Team Conference: July 01, 2019      TEAM MEMBERS PRESENT     Dr. Graciela Husbands  Margie Billet, RN/CM  Lang Snow, DPT  Orlene Och, LPTA  Oliver Hum, RD  Jose Persia, BSN  Bradly Bienenstock, RN   Annye Rusk, RN BSN  Will [place], RN   Loree Fee, RN   Racheal May, CNA  Consuela Mimes, CNA  [place], [place]  Grayling Congress D    MEDICAL     Extensive discussion re: patient's ongoing medical management issues, as well as medical and rehabilitative functional progress, plan of care, and discharge planning.    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    Venous stasis ulcer of right calf with fat layer exposed without varicose veins    Cellulitis of right leg    Stage 3a chronic kidney disease    Bilateral lower extremity edema    Chronic venous insufficiency    Pulmonary HTN    Chronic respiratory failure with hypoxia, on home O2 therapy    Chronic interstitial lung disease    Chronic diastolic CHF (congestive heart failure)    Pacemaker    Permanent atrial fibrillation     Isolation: None    Medications Scheduled:   Current Facility-Administered Medications   Medication Dose Route Frequency    allopurinol  300 mg Oral Daily    apixaban  5 mg Oral Q12H    cefTRIAXone  1 g Intravenous Q12H SCH    collagenase   Topical Q24H    fluorometholone  1 drop Left Eye QID    furosemide  40 mg Intravenous Daily    lactobacillus species  50 Billion CFU Oral Daily    latanoprost  1 drop Both Eyes QHS    metoprolol succinate XL  100 mg Oral Daily    oxyCODONE  10 mg Oral Q12H SCH    polyethylene glycol  17 g Oral Daily    potassium chloride  10 mEq Oral TID MEALS    predniSONE  5  mg Oral QAM W/BREAKFAST    sertraline  50 mg Oral Daily    sodium chloride (PF)  3 mL Intravenous Q8H     Medications PRN: albuterol, ALPRAZolam, ketorolac, naloxone, oxyCODONE-acetaminophen, sodium chloride, traMADol    Medication changes (changes from previous week): See [facility]    NURSING      Pain:    Pain controlled with medication    Over the last week, the patient has rated pain 3/10 at the minimum and 9/10 at the maximum.  Over the last 24 hours, the patients maximum pain rating has been 7/10.      Patient was encouraged to notify staff if pain becomes unbearable. Patient was educated on pain relieving techniques including: .  The Patient was encouraged to notify staff as needed to assist with managing pain.  Nursing will continue to assess pain and effectiveness of non-pharmacological and pharmacological interventions.       DVT prophylaxis:    Apixaban / Eliquis               Bowel/Bladder:  BLADDER FUNCTION  BOWEL FUNCTION  BSC/continet bowel and bladder          Skin:    Braden Scale  Sensory Perceptions: Slightly limited  Moisture: Rarely moist  Activity: Walks occasionally  Mobility: Slightly limited  Nutrition: Adequate  Friction and Shear: Potential problem  Braden Scale Score: 18    Braden Skin Interventions  Sensory Perception Interventions: Arrange/pad/secure lines, tubing and other equipment;Repositioned q2 hrs, keep pressure off injured skin;Appropriate surface (bed) per therapeutic bed algorithm  Moisture level Interventions: Offer hourly toileting opportunities  Activity/Mobility Interventions: Offered assistance with transfers/ambulation to increase activity  Nutrition Interventions: Assisted patient with eating PRN  Friction and Shear Interventions: Draw sheet placed from patient's shoulders to hips to reposition    Left tibia /right lower leg wounds         Patient/Family Education Needs:  Anticoagulation teaching to be initiated Patient is on IVF medication - refer to medical records for  details              Nursing Long Term Goals -    Patient will be free from injury during hospitalization  Perineal skin integrity is maintained or improved     Nursing Interventions:  IV Medication(s), Pain Management and Skin Care    Is the patient making progress towards nursing goals?  Progressing    Comments/Issues:  none    The above nursing section completed by:  Y. [place]    PHYSICAL THERAPY     Physical Therapy is ordered for this patient      Bed Mobility:  Comments: Pt able to roll side to side with Mod I with cuing for UE placement.     Transfers:  Not tested due to Pt finishing up with Nursing staff for toiletting.     Weight-bearing status: Full weight bearing            Long Term Goals:   STGs to be met in 1 week:  1. Pt will verbalize 1 exercise/movement that she can do throughout the day to maintain good skin integrity on her sacrum in order to prevent skin breakdown during bedrest; On going   2. Pt will demonstrate ability to complete established HEP in order to maintain good strength in her LEs during bedrest; On going    Physical Therapy for Exercise, Gait training, Functional transfer training, LE strengthening/ROM, Patient/caregiver training       Is the patient making progress towards physical therapy goals? Progressing    Comments/Issues:  Pt is progressing towards goals seen by moving on to next bed exercise with no prompting demonstrating ability to complete exercises outside of therapy. Will continue with increasing strength to BLE's for improved safety during home transfers and ambulation.     The above physical therapy section completed by:  Orlene Och, LPTA    OCCUPATIONAL THERAPY     Occupational Therapy is not ordered for this patient          SPEECH LANGUAGE PATHOLOGY     Speech/ Language Therapy is not ordered for this patient.       NUTRITION THERAPY     The patient Geriatric trigger for a Nutrition Screen. With CAD    Orders Placed This Encounter   Procedures    Diet regular  Na restriction, if any: 2 GM NA; Patient preferences: PATIENT CANNOT EAT RICE.     This patient did not have a Significant Weight Change:       Wt Readings from Last 1 Encounters:  06/30/19 0653 82.1 kg (181 lb)   06/29/19 0754 81.5 kg (179 lb 10.8 oz)   06/28/19 0659 81.8 kg (180 lb 5.4 oz)   06/26/19 1421 67.2 kg (148 lb 2.4 oz)   06/25/19 0639 80.8 kg (178 lb 2.1 oz)     The patients meal intake is adequate at ~50% for estimated needs               Comments/Issues:   Nursing will continue to monitor weights  Will continue to monitor intakes  Patient to continue with diet plan.      The above nutrition therapy section completed by:  Oliver Hum, MS RD    RESPIRATORY THERAPY     Respiratory Therapy is ordered for this patient.     Patient, prior to admission, was on oxygen at 4 L/min via nasal cannula.     Patient was not using a Positive Airway Pressure system prior to admission.     Respiratory Therapy Orders: Albuterol nebulizer 2.5 mg Q4H PRN.     Comments/Issues:      The above respiratory therapy section completed by:  D Klonowski RRT-ACCS, AE-C    ACTIVITIES PLAN     Is the patient participating in activities?  Patient is capable of independently pursuing his/her own leisure activities without intervention from the facility.     The above activities plan section completed by:  Margie Billet, RN, BSN    CASE MANAGEMENT / SOCIAL SERVICE / DISCHARGE PLAN       Discharge Plan Update:  Home with family/other      DME to order:   Has needed equipment        Anticipated Discharge Services:    Home Health - for specific Home Health agency assignment, please see CM/SW note.     A Patient/Family conference is not needed.      Plan communicated to: Dyane Dustman - Case management to discuss discharge disposition with patient and family (with patient's permission).    Anticipated date of discharge: TBD pending progress    Scheduled Follow-up appointments: none at this time    The above case  management/social service/discharge plan section completed by:  Margie Billet, RN, BSN    Continued Skilled Care Involves the Following   (Section to be completed at the end of the team meeting)     Changes to the current plan of care:   no changes are needed to current plan of care    BARRIERS TO REHABILITATIVE GOALS  BARRIERS TO DISCHARGE  Unavailability of home and family support    Patient continues to demonstrate reasonable potential for functional and/ or medical improvement from intensive and comprehensive skilled services under the direction of attending physician.      Continue with the recommended plan of care as above per IDT members in attendance.

## 2019-07-01 NOTE — Progress Notes (Signed)
Right leg pain control continues to be a challenge.  The patient is on OxyContin 10 mg every 12 hours and Percocet 5/325 1 every 8 hours if needed for breakthrough pain.  She is also getting a low-dose of Xanax every 6 hours for anxiety.  Today I am adding gabapentin 100 mg every 8 hours on a scheduled basis.    Active Hospital Problems    Diagnosis   . Subtherapeutic international normalized ratio (INR)   . Cellulitis of right lower extremity   . 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 Venous stasis ulcer of right calf with fat layer exposed without varicose veins   . Cellulitis of right leg   . Stage 3a chronic kidney disease   . Bilateral lower extremity edema   . Chronic venous insufficiency   . Pulmonary HTN   . Chronic respiratory failure with hypoxia, on home O2 therapy   . Chronic interstitial lung disease   . Chronic diastolic CHF (congestive heart failure)   . Pacemaker   . Permanent atrial fibrillation     There is no evidence of overt CHF.  Her lungs are clear.  Her cardiac rhythm is irregular but the rate is controlled.  Abdomen is soft and nontender.  She is alert and oriented and in good humor.  The nursing students who are attending her have created a nice atmosphere in her room.    Electronically signed by Graciela Husbands D.O.

## 2019-07-01 NOTE — Progress Notes (Signed)
Nutrition Therapy  Nutrition Assessment    Patient Information:     Name:[name]   Age: 79 y.o.   Sex: female     MRN: [id number]    Recommendation:     1. Pt to continue with current diet plan    Nutrition History:     Pt admitted with chronic venous stasis disease and cellulitis for continue wound care. Pt is known to this RD from previous admissions. Her intake is adequate >50%    Nutrition Focus Physical Findings:  Chronically ill looking elderly    Nutrition Risk Level: Low    Nutrition Diagnosis:     No nutrition diagnosis at this time     Monitoring:  Evaluation:    PO/EN/PN intake:  Total energy intake   Labs:  Electrolyte Profile, Renal Profile and Glucose, casual    GI Profile:  Bowel Function   Nutrition Focused Physical:  Overall appearance       Assessment Data:     Admission Dx:  Pain, unspecified [R52]  Cellulitis of right lower extremity [L03.115]  PMH:  has a past medical history of Aortic aneurysm, Atrial fibrillation, Chronic obstructive pulmonary disease, Coronary artery disease, Gastroesophageal reflux disease, Hypertension, Malignant neoplasm, Pacemaker, and SSS (sick sinus syndrome).  PSH:  has a past surgical history that includes Hysterectomy and Cardiac pacemaker placement.     Height:     Weight: 84.1 kg (185 lb 6.5 oz)   BMI: Body mass index is 36.21 kg/m.   IBW:  46 kg +/- 10%  Weight Monitoring Height Height Method Weight Weight Method   06/28/2019   81.8 kg Bed Scale   06/29/2019   81.5 kg Bed Scale   06/30/2019   82.1 kg Bed Scale   07/01/2019   84.1 kg    07/01/2019   84.1 kg      Weight Monitoring BMI (calculated)   06/28/2019    06/29/2019    06/30/2019    07/01/2019    07/01/2019        Pertinent Meds: As reviewed  Pertinent Labs:  Recent Labs   Lab 06/29/19  0530 06/28/19  0505 06/27/19  0650 06/26/19  0615 06/25/19  0655   Sodium 134* 136 139 135* 136   Potassium 4.3 3.6 3.9 3.4* 3.4*   Chloride 93* 95* 97* 93* 94*   CO2 31.0* 34.0* 31.0* 31.0* 31.0*   BUN 45* 43* 48* 50* 51*    Creatinine 0.86 0.81 1.00 0.90 0.95   Glucose 89 99 116* 87 91   Calcium 10.0 9.8 9.8 9.4 9.6   Osmolality Calculated 279 283 291 283 285          Diet Order:  Orders Placed This Encounter   Procedures   . Diet regular Na restriction, if any: 2 GM NA; Patient preferences: PATIENT CANNOT EAT RICE.        GI symptoms:    +BM 3/1  Hydration:   I/O:  No intake/output data recorded.  No intake/output data recorded.      Skin:  Braden scale 19, nutrition 3      Food Security Issues:  No      Learning Needs:  No education needs at this time    Estimated Needs:  Estimated Energy Needs  Total Energy Estimated Needs: 1150 - 1380 kcals/d  Method for Estimating Needs: 25 - 30 kcals/kg IBW (46 kg)  Estimated Protein Needs  Total Protein Estimated Needs: 55 - 64 gm/d  Method for Estimating Needs: 1.2 - 1.4 gm/kg IBW (46 kg)       Additional Comments:       Leilani Merl, RD  07/01/2019 9:19 AM

## 2019-07-01 NOTE — PT Plan of Care Note (Signed)
[facility]    [facility]  [address]  [place], [place] [address]    Department of Rehabilitation  Wound Care Screening  [phone]    [name] CSN: [id number]  @DEBTNME @ 203/203-A    Time: 10:30    Pt seen with Nursing staff for dressing change. No debridement suggested d/t pt's pain levels, therefore Nursing staff to remain primary wound care provider with PT wound care monitoring process.   Suggestion of Iodosorb to wound sites with Exudry to cover and kerlix to secure. Dressing changes to be completed by Nurses every 2 days to increase healing of wounds and decrease pain from frequent dressing changes.   Small tensoshape donned with pt voicing pain, therefore adjusted to Medium. The tensoshape can remain on for edema control while monitoring pt's tolerance.    Communication: ("X" All that apply)       RN X       PA/NP        MD X       PT/OT/SLP X       Patient X       Family/caregiver        Other (specify)         Plan: Will continue to monitor wound site via interdisciplinary rounding and communication with Nursing staff.     [name], LPTA

## 2019-07-01 NOTE — Plan of Care (Signed)
Wound care performed with [facility] PTA. Pt tolerated well. Wound care instructions updated per PT recommendations.

## 2019-07-02 MED ORDER — NYSTATIN 100000 UNIT/ML MT SUSP
500000.00 [IU] | Freq: Four times a day (QID) | OROMUCOSAL | Status: DC
Start: 2019-07-02 — End: 2019-07-06
  Administered 2019-07-02 – 2019-07-03 (×5): 500000 [IU] via ORAL
  Filled 2019-07-02 (×7): qty 5

## 2019-07-02 NOTE — Progress Notes (Addendum)
Patient awake and alert at time of assessment. Denies any needs or concerns at this time. Rated pain 2/10; scheduled oxy given with morning medications. RLE dressing remains in place, clean, dry and intact; dur to be changed tomorrow per orders. 4L via NC in place; no s/s or complaints of SOB at this time. Bed in low and locked position with alarm on for safety. Call bell and all personal belongings within reach.     Visit Vitals  BP 132/68   Pulse 74   Temp 97 F (36.1 C) (Oral)   Resp (!) 24   Wt 84.1 kg (185 lb 6.5 oz)   SpO2 96%   BMI 36.21 kg/m

## 2019-07-02 NOTE — Progress Notes (Signed)
07/02/19 0954   Patient Assessment   Status Completed   RT Priority 3   Pulmonary History COPD   Airway type Other (Comment)  (natural)   Surgical Status None   Mobility Ambulatory with or without assistance   CXR none is past 72 hours   Cough Non-productive   Heart Rate 74   Resp Rate 20   Bilateral Breath Sounds Clear;Diminished   Location Specific No   Home regimen   Home Treatments Albuterol PRN   Home Oxygen 4L   Home CPAP/BiLevel none   Oxygen Therapy   SpO2 96 %   O2 Device Nasal cannula   O2 Flow Rate (L/min) 4 L/min   Criteria for Therapy   Respiratory Medications Patient receives maintenance therapy at home  (PRN)   Oxygen Mild/Moderate: O2 < or equal to 5 lpm NC to maintain SpO2> or equal to 90%   Severity Index   Severity Index   (on home regimen O2 and PRN nebs)   Outcomes   Respiratory medication Patient is on maintenance or home regimen   Oxygen goals Other (Comment)  (Patient on 4L home oxygen)   Recommendations   Respiratory Medications Continue current therapy  (PRN)   Performing Departments   O2 Device performing department formula [id number]   Resp assess adult performing department formula [id number]    Patient assessment completed at this time.

## 2019-07-02 NOTE — Progress Note - Problem Oriented Charting Notewrit (Signed)
Cross cover note:    Called by nursing staff, due to mouth pain.    Nursing is reporting a white patch on the refer mouth, left side of the mouth.  This is very close or possibly underneath area of a partial denture.    Assessment: Unable to tell if this is thrush or not, will require evaluation by a local physician    Plan: Trial nystatin swish and swallow 4 times daily, until this can be evaluated.

## 2019-07-02 NOTE — Nursing Progress Note (Signed)
Pt has expressed complaints of mild oral pain which she rates 4 of 10. Pt noted to have a white patchy area to left roof of her mouth, no other areas visualized on this assessment. Pt states her throat is also sore, vitals are at baseline and pt is afebrile at 36.7 oral. Dr Melanee Spry notified about pts complaints, new orders received for nystatin suspension.

## 2019-07-03 LAB — COMPREHENSIVE METABOLIC PANEL
ALT: 15 U/L (ref 0–55)
AST (SGOT): 23 U/L (ref 10–42)
Albumin/Globulin Ratio: 1.15 Ratio (ref 0.80–2.00)
Albumin: 3 gm/dL — ABNORMAL LOW (ref 3.5–5.0)
Alkaline Phosphatase: 128 U/L (ref 40–145)
Anion Gap: 11.4 mMol/L (ref 7.0–18.0)
BUN / Creatinine Ratio: 46.3 Ratio — ABNORMAL HIGH (ref 10.0–30.0)
BUN: 38 mg/dL — ABNORMAL HIGH (ref 7–22)
Bilirubin, Total: 0.5 mg/dL (ref 0.1–1.2)
CO2: 32 mMol/L — ABNORMAL HIGH (ref 20.0–30.0)
Calcium: 10 mg/dL (ref 8.5–10.5)
Chloride: 97 mMol/L — ABNORMAL LOW (ref 98–110)
Creatinine: 0.82 mg/dL (ref 0.60–1.20)
EGFR: 67 mL/min/{1.73_m2} (ref 60–150)
Globulin: 2.6 gm/dL (ref 2.0–4.0)
Glucose: 91 mg/dL (ref 71–99)
Osmolality Calculated: 281 mOsm/kg (ref 275–300)
Potassium: 4.4 mMol/L (ref 3.5–5.3)
Protein, Total: 5.6 gm/dL — ABNORMAL LOW (ref 6.0–8.3)
Sodium: 136 mMol/L (ref 136–147)

## 2019-07-03 LAB — CBC AND DIFFERENTIAL
Basophils %: 1.5 % (ref 0.0–3.0)
Basophils Absolute: 0.1 10*3/uL (ref 0.0–0.3)
Eosinophils %: 0.1 % (ref 0.0–7.0)
Eosinophils Absolute: 0 10*3/uL (ref 0.0–0.8)
Hematocrit: 36 % (ref 36.0–48.0)
Hemoglobin: 10.4 gm/dL — ABNORMAL LOW (ref 12.0–16.0)
Lymphocytes Absolute: 0.9 10*3/uL (ref 0.6–5.1)
Lymphocytes: 12.1 % — ABNORMAL LOW (ref 15.0–46.0)
MCH: 27 pg — ABNORMAL LOW (ref 28–35)
MCHC: 29 gm/dL — ABNORMAL LOW (ref 31–36)
MCV: 92 fL (ref 80–100)
MPV: 7.1 fL (ref 6.0–10.0)
Monocytes Absolute: 0.8 10*3/uL (ref 0.1–1.7)
Monocytes: 10.8 % (ref 3.0–15.0)
Neutrophils %: 75.5 % (ref 42.0–78.0)
Neutrophils Absolute: 5.4 10*3/uL (ref 1.7–8.6)
PLT CT: 148 10*3/uL (ref 130–440)
RBC: 3.92 10*6/uL (ref 3.80–5.00)
RDW: 17.2 % — ABNORMAL HIGH (ref 10.5–14.5)
WBC: 7.1 10*3/uL (ref 4.0–11.0)

## 2019-07-03 LAB — C-REACTIVE PROTEIN: C-Reactive Protein: 1.81 mg/dL — ABNORMAL HIGH (ref 0.02–0.80)

## 2019-07-03 MED ORDER — OXYCODONE HCL ER 10 MG PO T12A
10.0000 mg | EXTENDED_RELEASE_TABLET | ORAL | Status: DC
Start: 2019-07-04 — End: 2019-07-04
  Filled 2019-07-03: qty 1

## 2019-07-03 NOTE — PT Progress Note (Addendum)
[facility]  Department of Rehabilitation Services  [phone]    [facility]     Physical Therapy Transitional Care Weekly Progress Note    [name]    CSN: [id number]    MEDICAL/SURGICAL   203/203-A    Date:  07/03/19  Precautions and Contraindications:   Bedrest starting 06/24/19, exceptions being going to restroom, and therapy.     History of Present Illness:   Medical Diagnosis: Pain, unspecified [R52]  Cellulitis of right lower extremity [L03.115]    Therapy Diagnosis:  Muscle weakness    [name] is a 79 y.o. female admitted on 06/24/2019 with need for continued IV antibiotics due to cellulitis of RLE. PT ordered in order to instruct pt in bed level exercises to prevent loss of strength/function while on bedrest.     Patient Active Problem List   Diagnosis   . CHF (congestive heart failure)   . Permanent atrial fibrillation   . Hyperkalemia   . Chronic diastolic CHF (congestive heart failure)   . On home oxygen therapy   . Aortic regurgitation   . Pacemaker   . Pacemaker at end of battery life   . Diaphragmatic eventration   . Chronic interstitial lung disease   . Thoracic aortic aneurysm without rupture   . Difficulty breathing   . Chronic respiratory failure with hypoxia, on home O2 therapy   . Nonobstructive atherosclerosis of coronary artery   . SSS (sick sinus syndrome)   . Drug-induced constipation   . Moderate asthma with acute exacerbation   . Pulmonary HTN   . Chronic venous insufficiency   . Bilateral lower extremity edema   . Diastolic CHF, acute on chronic   . Venous stasis ulcer of right ankle with fat layer exposed without varicose veins   . Stage 3a chronic kidney disease   . Cellulitis of right leg   . Hyponatremia   . Severe pain   . Venous stasis ulcer of right calf with fat layer exposed without varicose veins   . Cellulitis of right lower extremity   . 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 Subtherapeutic international normalized ratio  (INR)        Past Medical/Surgical History:  Past Medical History:   Diagnosis Date   . Aortic aneurysm    . Atrial fibrillation    . Chronic obstructive pulmonary disease    . Coronary artery disease    . Gastroesophageal reflux disease    . Hypertension    . Malignant neoplasm    . Pacemaker    . SSS (sick sinus syndrome)       Past Surgical History:   Procedure Laterality Date   . CARDIAC PACEMAKER PLACEMENT      2002   . HYSTERECTOMY           Social History:   Home Living Arrangements:  Living Arrangements: Family members  Type of Home: House   Home Layout: Ramped entrance, Able to live on main level with bedroom and bathroom    Prior Level of Function:  Household ambulation with FWW, Mod (I)        DME available at home:  [employer]   Home O2- 2L    Subjective   "I am so tired all the time."    Patient is agreeable to participation in the therapy session.        Examination of Body Systems (Structures, Function, Activity and Participation)  Observation of patient:     Pt is in bed with bed/chair alarm on, peripheral IV, O2 at 4 Liters/minute via nasal cannula in [name]     Pain:       At Rest: 5/10  With Activity: 5/10  Location: Leg:  right  Interventions: Medication (see eMAR)    Edema/Skin inspection:  Edema:  No pedal edema noted  No upper extremity edema noted    Skin inspection:  BLE demonstrate hemosiderin staining distally; buttocks/sacrum demonstrates purple discoloration; Distal RLE dressing intact   Picture of RLE wound site in chart.    Posture:  Forward Head:  seated    Musculoskeletal Examination:     Functional Mobility:  Bed Mobility:  Rolling to Left:  Modified independence .   Cues for Hand placement.  Supine scooting:  Modified independence    Supine to Sit:   Modified independence .   Cues for Hand placement.    Transfers:  Sit to Stand:  Supervision with Front wheeled walker     Locomotion:  LEVEL AMBULATION:  Distance: 20' x2   Assistance level:  SBA  Device:  Primary school teacher    Neurology:   Cognition:   Command following: Follows ALL commands and directions without difficulty         Behavior:  Cooperative, Anxious    Motor Planning/Coordination:  Motor Planning:  Intact  Coordination: Within Normal Limits (WNL)      Sensation:     Patient reporting No Numbness or Paresthesia  Sensation is intact , to light touch BLEs    Balance:   Static Sitting:  Good  Dynamic Sitting:  Good  Static Standing:  Good               Participation and Activity Tolerance   Participation effort: Excellent  Activity Tolerance: Tolerates 10-20 minutes of activity with multiple rests    Education Provided:   TOPICS: role of physical therapy, plan of care, goals of therapy and HEP, benefits of activity, energy conservation techniques, home safety    Learner educated: Patient  Method: Explanation and Demonstration  Response to education: Verbalized understanding    Team Communication:   Interdisciplinary Communication has been provided through daily rounds, weekly meetings and whiteboard communication.  PT/PTA communication: via written note and verbal communication as needed.    Assessment:    [name] was admitted 06/24/2019 with need for continued IV antibiotics due to cellulitis of RLE.    Patient's current impairments include:decreased strength, decreased activity tolerance, pain    Patient will continue to benefit from skilled PT services in order to reach all goals set forth by PT to ensure safe transition at [facility].     Rehabilitation Potential:  Good    Discussed risk, benefits and Plan of Care with: Patient     Pt is progressing towards goals seen by increasing distance during ambulation as well as increased (I) with ambulation. SBA is provided for max safety due to high anxiety at times causing pt to rush. Pt able to communicate as well as demonstrate SLR, glute squeezes, and heel raises in bed to maintain good skin integrity. Will continue to focus on increasing BLE strength for  improved safety with ambulation and increased (I) at home.     Goals:   STGs to be met in 1 week:  1. Pt will verbalize 1 exercise/movement that she can do throughout the day to maintain good skin integrity on her sacrum in order  to prevent skin breakdown during bedrest; MET  2. Pt will demonstrate ability to complete established HEP in order to maintain good strength in her LEs during bedrest; On going  3. Pt will amb >50' using FWW with S for increased (I) with household level mobility; NEW        Plan of Care:   Treatment/interventions: Exercise, LE strengthening/ROM    Treatment Frequency:  5-6 days/week for 1 weeks         DISCHARGE RECOMMENDATIONS   DME recommended for Discharge:   TBD closer to discharge    Discharge Recommendations:   To be determined closer to discharge    [employer], LPTA

## 2019-07-03 NOTE — Plan of Care (Signed)
See weekly progress note for details.

## 2019-07-04 LAB — VH URINALYSIS WITH MICROSCOPIC AND CULTURE IF INDICATED
Bilirubin, UA: NEGATIVE mg/dL
Blood, UA: NEGATIVE mg/dL
Glucose, UA: NEGATIVE mg/dL
Ketones UA: NEGATIVE mg/dL
Nitrite, UA: NEGATIVE
Protein, UR: NEGATIVE mg/dL
Urine Specific Gravity: 1.01 (ref 1.001–1.040)
Urobilinogen, UA: 0.2 mg/dL
pH, Urine: 5.5 pH (ref 5.0–8.0)

## 2019-07-04 MED ORDER — SERTRALINE HCL 50 MG PO TABS
50.0000 mg | ORAL_TABLET | Freq: Every evening | ORAL | Status: DC
Start: 2019-07-05 — End: 2019-07-11
  Administered 2019-07-05 – 2019-07-10 (×6): 50 mg via ORAL
  Filled 2019-07-04 (×6): qty 1

## 2019-07-04 MED ORDER — OXYCODONE HCL 5 MG PO TABS
5.0000 mg | ORAL_TABLET | Freq: Four times a day (QID) | ORAL | Status: DC | PRN
Start: 2019-07-04 — End: 2019-07-11
  Administered 2019-07-05 – 2019-07-11 (×15): 5 mg via ORAL
  Filled 2019-07-04 (×15): qty 1

## 2019-07-04 NOTE — Progress Notes (Signed)
79 year old white female transitional care following hospitalization for cellulitis to RLE and three non healing venous stasis wounds.  She has chronic heart failure with a slight increase in LE edema today but she continues on IV furosemide daily.  I am going to order one extra dose today.  We also have applied tubi grip compression hose to LE.  I think these will also facilitate healing of venous stasis wounds.  I am assessing wounds today with physical therapist who has been assisting with wound care. Actually wound beds appear cleaner.  There is a scant amount of dressing, no odor and surrounding skin is intact with mild chronic erythema but no warmth to touch.  She is chronically anticoagulated for atrial fibrillation.  She has chronic interstitial lung disease with chronic respiratory failure and continuous oxygen supplementation at 4L NC.  Nursing staff requested evaluation today for increased confusion and mild lethargy.  All sedating medications were held this morning.  When I come to see the patient she is arousable and answering questions appropriately.  UA C & S preliminary results appear unremarkable.  CO2 slightly elevated at 32.0.  WBC WNL.  Most recent GFR 67.  I discussed with Dr. Graciela Husbands.  I am going to discontinue long acting oxycontin narcotic medication.  I am also going to discontinue gabapentin which was recently started and certainly could be the culprit for her new onset confusion.  I also evaluated as nursing staff reported "white patch" noted to patient's upper palate.  She was started on nystatin swish and swallow.  There is one small white lesion noted but other exudate to upper and lower gums appears to be related to hygiene.  I discussed with nursing and CNA staff and we have cleaned her upper and lower partial dentures.  I am going to clarify orders to make sure plates are removed for hygiene and also for nystatin administration.  I have asked nursing to wean down oxygen  supplementation.  Oxygen saturation 97% on 4L NC, 95% on 3L NC and now 93-94% on 2L NC.  We will continue at 2L NC at this time.  She is not having any respiratory distress.  She is afebrile.  She and staff have no other acute concerns today.      BP 125/81   Pulse 78   Temp 97.7 F (36.5 C) (Axillary)   Resp 18   Wt 82.1 kg (181 lb)   SpO2 98%   BMI 35.35 kg/m     Lab Results last 48 Hours     Procedure Component Value Units Date/Time    Urine Culture [[id number]] Collected: 07/04/19 1015    Specimen: Urine, Random Updated: 07/04/19 1239    Urinalysis w Microscopic and Culture if Indicated [[id number]]  (Abnormal) Collected: 07/04/19 1015    Specimen: Urine, Random Updated: 07/04/19 1055     Color, UA Yellow     Clarity, UA Clear     Urine Specific Gravity 1.010     pH, Urine 5.5 pH      Protein, UR Negative mg/dL      Glucose, UA Negative mg/dL      Ketones UA Negative mg/dL      Bilirubin, UA Negative mg/dL      Blood, UA Negative mg/dL      Nitrite, UA Negative     Urobilinogen, UA 0.2 mg/dL      Leukocyte Esterase, UA Trace Leu/uL      UR Micro Performed  WBC, UA 1-2 /hpf      RBC, UA 1-2 /hpf      Bacteria, UA Few /hpf      Squam Epithel, UA 5-10 /lpf      Budding Yeast, UA Few /hpf      Hyphal Yeast, UA Few /hpf      Hyaline Casts, UA 0-2 /lpf     Narrative:      A Urine Culture has been ordered based upon the Positive UA results.    C Reactive Protein [[id number]]  (Abnormal) Collected: 07/03/19 0650    Specimen: Plasma Updated: 07/03/19 0805     C-Reactive Protein 1.81 mg/dL     CBC and differential [[id number]]  (Abnormal) Collected: 07/03/19 0650    Specimen: Blood Updated: 07/03/19 0805     WBC 7.1 K/cmm      RBC 3.92 M/cmm      Hemoglobin 10.4 gm/dL      Hematocrit 78.2 %      MCV 92 fL      MCH 27 pg      MCHC 29 gm/dL      RDW 95.6 %      PLT CT 148 K/cmm      MPV 7.1 fL      Neutrophils % 75.5 %      Lymphocytes 12.1 %      Monocytes 10.8 %      Eosinophils % 0.1 %      Basophils % 1.5 %       Neutrophils Absolute 5.4 K/cmm      Lymphocytes Absolute 0.9 K/cmm      Monocytes Absolute 0.8 K/cmm      Eosinophils Absolute 0.0 K/cmm      Basophils Absolute 0.1 K/cmm      RBC Morphology RBC Morphology Reviewed     Macrocytic 1+     Microcytic 1+     Anisocytosis 1+     Hypochromia 1+     Burr Cells 1+     Elliptocytes 1+     Poikilocytosis 2+    Comprehensive metabolic panel [[id number]]  (Abnormal) Collected: 07/03/19 0650    Specimen: Plasma Updated: 07/03/19 0747     Sodium 136 mMol/L      Potassium 4.4 mMol/L      Chloride 97 mMol/L      CO2 32.0 mMol/L      Calcium 10.0 mg/dL      Glucose 91 mg/dL      Creatinine 4.69 mg/dL      BUN 38 mg/dL      Protein, Total 5.6 gm/dL      Albumin 3.0 gm/dL      Alkaline Phosphatase 128 U/L      ALT 15 U/L      AST (SGOT) 23 U/L      Bilirubin, Total 0.5 mg/dL      Albumin/Globulin Ratio 1.15 Ratio      Anion Gap 11.4 mMol/L      BUN / Creatinine Ratio 46.3 Ratio      EGFR 67 mL/min/1.35m2      Osmolality Calculated 281 mOsm/kg      Globulin 2.6 gm/dL         Review of Systems   Constitutional: Negative for chills, diaphoresis and fever.   HENT: Negative for congestion, ear pain, sinus pain and sore throat.         Upper and lower partial plate dentures   Eyes: Negative for pain, discharge and redness.  Glasses.  Patient reported blurry vision to nursing staff but denies at the time of my assessment.    Respiratory: Positive for shortness of breath. Negative for cough and wheezing.    Cardiovascular: Negative for chest pain.   Gastrointestinal: Negative for abdominal pain, blood in stool, constipation, diarrhea, heartburn, nausea and vomiting.   Genitourinary: Negative for dysuria and hematuria.   Musculoskeletal:        Chronic pain with movement of extremities   Neurological: Negative for dizziness and headaches.   Psychiatric/Behavioral: The patient is nervous/anxious. The patient does not have insomnia.      Physical Exam   Constitutional: She is oriented to  person, place, and time and well-developed, well-nourished, and in no distress. No distress.   HENT:   Mouth/Throat: Oropharynx is clear and moist.   Upper and lower partial dentures with native in poor repair.     Eyes: Conjunctivae are normal. Right eye exhibits no discharge. Left eye exhibits no discharge.   Neck: Neck supple. No JVD present.   Cardiovascular: Normal rate.   Irregularly irregular rhythm   Pulmonary/Chest: Effort normal and breath sounds normal. No respiratory distress. She has no wheezes. She has no rales.   Abdominal: Soft. Bowel sounds are normal. She exhibits no distension. There is no abdominal tenderness. There is no rebound.   Musculoskeletal:      Comments: Limited ROM to osteoarthritis and anxiety, DJD changes noted, mild kyphosis.  Trace edema to RLE (patient had tubi grip compression sock on this leg), +1 pitting to LLE.     Neurological: She is alert and oriented to person, place, and time.   Speech clear, no facial drooping or drooling is noted.    Skin: Skin is warm and dry. She is not diaphoretic.   Slightly pale in color.  Chronic stasis changes with hyperpigmentation noted to BLE.  Dry flaking skin but improved with care from staff.     Psychiatric:   Patient is talkative and interactive.  Mood and affect appear at baseline.  Mild anxiety unchanged.       Assessment and Plan    1. Confusion, lethargy, D/C oxycontin and gabapentin.  Provide oxycodone prn for pain.  UA C & S sent awaiting culture and sensitivity but preliminary appears negative.  No leukocytosis on CBC.  Afebrile.  No focal deficits noted.    2.  Chronic respiratory failure, slightly elevated CO2, we have weaned patient down on oxygen supplementation and we will continue to monitor.  I do not think this is necessarily contributing to the confusion.    3.  CHF, chronic diastolic, continue diuretic daily.  One extra dose of furosemide will be given today.    4.  CKD 3, stable, most recent GFR 67.    5.  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  Continue ceftriaxone as ordered.    6.  Oral candidiasis, continue nystatin as ordered.  I have clarified administration directions.    7.  Permanent atrial fibrillation, rate well controlled, continue metoprolol XL and apixaban as ordered.    8.  Depression with anxiety.  Continue sertraline as ordered but change to HS administration.  Continue low dose alprazolam as ordered.  Patient has been tolerating well and I do not think this is causing confusion today.    9.  Hypokalemia, continue po potassium supplementation.    10.  Cellulitis RLE, improved, continue ceftriaxone as ordered.  Electronically signed:  Sharrell Ku. Mosie Epstein, FNP

## 2019-07-04 NOTE — Progress Notes (Signed)
Patient drowsy upon assessment but arousable to voice.  She is considerably more confused this morning than she has been since admission. Urine sample obtained and sent to the lab to rule out UTI. Denies any pain, needs or concerns at this time. 3L O2 via NC in place; patient weaned down per Angie,NP request and is tolerating well. Denies any SOB or chest pains this morning. Morning medications administered but patient requiring prompting on what to do ex: swallow pills. RLE dressing changed. Bed in low and locked position with alarm on for safety. Call bell and all personal belongings are within reach.

## 2019-07-04 NOTE — Progress Notes (Addendum)
Patient tolerated vaccine well without any adverse reactions

## 2019-07-04 NOTE — Progress Notes (Addendum)
Covid vaccine given per patient and family request. Patient tolerated well. Will monitor 15 minutes following initial vaccination. Consent form signed and copy was given to patient.   Brand: Pfizer  Lot number: [id number]  Location: Left Deltoid

## 2019-07-05 DIAGNOSIS — R079 Chest pain, unspecified: Secondary | ICD-10-CM

## 2019-07-05 NOTE — Progress Notes (Signed)
07/05/19 1009   Oxygen Therapy/Pulse Ox   O2 Status In Use;Humidifier Used   O2 Device Nasal cannula   O2 Flow Rate (L/min) 2 L/min   SpO2 93 %   $ Pulse Oximetry Type Performed Single Diagnostic   Performing Departments   O2 Device performing department formula [phone]   POX check performing department formula [phone]   Oxygen performing department formula [phone]

## 2019-07-05 NOTE — Progress Notes (Signed)
Rosmarie is alert and oriented x3 this morning. She denies any pain, needs or concerns this morning. All morning medications administered without difficulties this morning. RLE dressing clean, dry and intact. Bed is in the low and locked position with alarm on for safety. Call bell and all personal belongings are within reach. 2L NC in place; SpO2 93%.     ,  Visit Vitals  BP 130/87   Pulse 87   Temp 97.9 F (36.6 C) (Oral)   Resp 20   Wt 85.3 kg (188 lb 0.8 oz)   SpO2 93%   BMI 36.73 kg/m

## 2019-07-05 NOTE — Progress Notes (Signed)
1900-0700    A&Ox3. Forgetful at times. Anxious. Xanax given once this shift per pt request. Pt requested Albuterol treatment last night. Up to [facility] to void w/ 1 assist and walker. BLE elevated on pillows. Dressings to BLE CDI. Pt refused Nystatin this shift. No significant changes noted. Bed alarm in place. Call bell within reach.

## 2019-07-06 LAB — ECG 12-LEAD
Patient Age: 79 years
Q-T Interval(Corrected): 456 ms
Q-T Interval: 301 ms
QRS Axis: 7 deg
QRS Duration: 98 ms
T Axis: 221 years
Ventricular Rate: 138 //min

## 2019-07-06 MED ORDER — CALCIUM CARBONATE ANTACID 500 MG PO CHEW
1000.00 mg | CHEWABLE_TABLET | ORAL | Status: DC | PRN
Start: 2019-07-06 — End: 2019-07-11
  Administered 2019-07-06 – 2019-07-09 (×3): 1000 mg via ORAL
  Filled 2019-07-06 (×3): qty 2

## 2019-07-06 NOTE — Progress Notes (Signed)
6045 Notified MD Berneice Heinrich pt c/o mid Chest pain 10/10 with heavy sensation BP 144/94 HR 101 and pain radiating to upper back and pt belching often. EKG showed A-fib HR 138. Pt has hx A-fib. Xanax given at 2314 d/t pt anxiousness, which pt has hx of. At 0028 BP 100/58 HR 92 RR 20. Pt rates mid chest pain 4/10 and feeling much better.     Telephone Order:  2 Tums q 4hr PRN indigestion.

## 2019-07-06 NOTE — Progress Notes (Signed)
Per patient's nurse, patient had earlier c/o mid chest pain radiating to her back, burping a lot.  EKG: AFIB P.138, BP 140/94    Patient appeared anxious and was given Xanax after which patient reported improvement per nurse.  Repeat vitals 100/58, P 92.  Patient comfortable.    For indigestion, will start Tums.

## 2019-07-06 NOTE — Progress Notes (Signed)
1900-0700    A&Ox3. Forgetful.  Anxious last night. Xanax given once. Pt weaned to 1L of oxygen via NC last night, but pt desat to 80-86% when up to [facility] and back to bed. Pt increased to 2L O2 with maintaining sats 89-93%. RT gave PRN Albuterol treatment for pt SOB last night. Tums ordered and given this am for indigestion. BM passed this am. BLE elevated on pillows. Dressings CDI to BLE. Pt refused Nystatin. Bed alarm in place.

## 2019-07-07 LAB — COMPREHENSIVE METABOLIC PANEL
ALT: 19 U/L (ref 0–55)
AST (SGOT): 23 U/L (ref 10–42)
Albumin/Globulin Ratio: 1.12 Ratio (ref 0.80–2.00)
Albumin: 2.9 gm/dL — ABNORMAL LOW (ref 3.5–5.0)
Alkaline Phosphatase: 134 U/L (ref 40–145)
Anion Gap: 15.2 mMol/L (ref 7.0–18.0)
BUN / Creatinine Ratio: 30.8 Ratio — ABNORMAL HIGH (ref 10.0–30.0)
BUN: 24 mg/dL — ABNORMAL HIGH (ref 7–22)
Bilirubin, Total: 0.6 mg/dL (ref 0.1–1.2)
CO2: 31 mMol/L — ABNORMAL HIGH (ref 20.0–30.0)
Calcium: 10.1 mg/dL (ref 8.5–10.5)
Chloride: 97 mMol/L — ABNORMAL LOW (ref 98–110)
Creatinine: 0.78 mg/dL (ref 0.60–1.20)
EGFR: 72 mL/min/{1.73_m2} (ref 60–150)
Globulin: 2.6 gm/dL (ref 2.0–4.0)
Glucose: 90 mg/dL (ref 71–99)
Osmolality Calculated: 281 mOsm/kg (ref 275–300)
Potassium: 4.2 mMol/L (ref 3.5–5.3)
Protein, Total: 5.5 gm/dL — ABNORMAL LOW (ref 6.0–8.3)
Sodium: 139 mMol/L (ref 136–147)

## 2019-07-07 LAB — CBC AND DIFFERENTIAL
Basophils %: 1.2 % (ref 0.0–3.0)
Basophils Absolute: 0.1 10*3/uL (ref 0.0–0.3)
Eosinophils %: 0.2 % (ref 0.0–7.0)
Eosinophils Absolute: 0 10*3/uL (ref 0.0–0.8)
Hematocrit: 37.4 % (ref 36.0–48.0)
Hemoglobin: 11.1 gm/dL — ABNORMAL LOW (ref 12.0–16.0)
Lymphocytes Absolute: 0.8 10*3/uL (ref 0.6–5.1)
Lymphocytes: 11.3 % — ABNORMAL LOW (ref 15.0–46.0)
MCH: 27 pg — ABNORMAL LOW (ref 28–35)
MCHC: 30 gm/dL — ABNORMAL LOW (ref 31–36)
MCV: 91 fL (ref 80–100)
MPV: 7.6 fL (ref 6.0–10.0)
Monocytes Absolute: 0.7 10*3/uL (ref 0.1–1.7)
Monocytes: 10.1 % (ref 3.0–15.0)
Neutrophils %: 77.3 % (ref 42.0–78.0)
Neutrophils Absolute: 5.5 10*3/uL (ref 1.7–8.6)
PLT CT: 167 10*3/uL (ref 130–440)
RBC: 4.09 10*6/uL (ref 3.80–5.00)
RDW: 18.1 % — ABNORMAL HIGH (ref 10.5–14.5)
WBC: 7.1 10*3/uL (ref 4.0–11.0)

## 2019-07-07 MED ORDER — ACETAMINOPHEN 325 MG PO TABS
650.0000 mg | ORAL_TABLET | Freq: Four times a day (QID) | ORAL | Status: DC | PRN
Start: 2019-07-07 — End: 2019-07-11

## 2019-07-07 MED ORDER — VH SPIRONOLACTONE 25 MG PO TABS
25.0000 mg | ORAL_TABLET | Freq: Every day | ORAL | Status: DC
Start: 2019-07-07 — End: 2019-07-11
  Administered 2019-07-07 – 2019-07-11 (×5): 25 mg via ORAL
  Filled 2019-07-07 (×5): qty 1

## 2019-07-07 NOTE — Progress Notes (Addendum)
Patient resting in bed with BLE elevated on pillows. Pt weaned to RA last night, sat at 93 but patient desat to 85% when up to [facility] and back to bed. Pt increased to 2L O2 with maintaining sats at 96%. RT gave PRN Albuterol treatment for pt SOB last night. Bed alarm in place

## 2019-07-07 NOTE — Patient Care Conference (Signed)
[facility]    INTERDISCIPLINARY CARE PLAN   TEAM CONFERENCE NOTE    Patient: [name] [facility]  Admission Date: 06/24/2019    Date of Team Conference:  July 08, 2019     TEAM MEMBERS PRESENT     Dr. Ellene Route, FNP  Margie Billet, RN/CM  [place], LPTA  Gilberto Better, MSOTR/L  Fredricka Bonine, [place]   Annye Rusk, RN BSN  Reatha Harps, RN    Racheal May, CNA  Tiffany Thornton, CNA  Consuela Mimes, CNA  [employer], CNA  [employer], [place]  Silvana Newness, [place]    MEDICAL     Extensive discussion re: patient's ongoing medical management issues, as well as medical and rehabilitative functional progress, plan of care, and discharge planning.    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    Venous stasis ulcer of right calf with fat layer exposed without varicose veins    Cellulitis of right leg    Stage 3a chronic kidney disease    Bilateral lower extremity edema    Chronic venous insufficiency    Pulmonary HTN    Chronic respiratory failure with hypoxia, on home O2 therapy    Chronic interstitial lung disease    Chronic diastolic CHF (congestive heart failure)    Pacemaker    Permanent atrial fibrillation     Isolation: None    Medications Scheduled:   Current Facility-Administered Medications   Medication Dose Route Frequency    allopurinol  300 mg Oral Daily    apixaban  5 mg Oral Q12H    cefTRIAXone  1 g Intravenous Q12H SCH    fluorometholone  1 drop Left Eye daily at 2100    furosemide  40 mg Intravenous Daily    lactobacillus species  50 Billion CFU Oral Daily    latanoprost  1 drop Both Eyes QHS    metoprolol succinate XL  100 mg Oral Daily    polyethylene glycol  17 g Oral Daily    potassium chloride  10 mEq Oral TID MEALS    predniSONE  5 mg Oral QAM W/BREAKFAST    sertraline  50  mg Oral QHS    sodium chloride (PF)  3 mL Intravenous Q8H     Medications PRN: albuterol, ALPRAZolam, calcium carbonate, naloxone, oxyCODONE, sodium chloride    Medication changes (changes from previous week): See [facility]    NURSING      Pain:    Pain controlled with medication    Over the last week, the patient has rated pain 0/10 at the minimum and 7/10 at the maximum.  Over the last 24 hours, the patients maximum pain rating has been 7/10.      Patient was encouraged to notify staff if pain becomes unbearable. Patient was educated on pain relieving techniques including: Pharmacological Interventions (see MAR), Repositioning and Rest.  The Patient was encouraged to notify staff as needed to assist with managing pain.  Nursing will continue to assess pain and effectiveness of non-pharmacological and pharmacological interventions.       DVT prophylaxis:    Apixaban / Eliquis               Bowel/Bladder:  BLADDER FUNCTION  Independent with voiding  BOWEL FUNCTION  Independent with bowel  program        Skin:    Braden Scale  Sensory Perceptions: Slightly limited  Moisture: Occasionally moist  Activity: Walks occasionally  Mobility: Slightly limited  Nutrition: Adequate  Friction and Shear: No apparent problem  Braden Scale Score: 18    Braden Skin Interventions  Sensory Perception Interventions: Float heels with pillows, boots, or other positioners  Moisture level Interventions: Offer hourly toileting opportunities  Activity/Mobility Interventions: Keep HOB 30 degrees or less, unless contraindicated  Nutrition Interventions: Assisted patient with eating PRN  Friction and Shear Interventions: Keep HOB 30 degrees or less, unless contraindicated    Right leg      Patient/Family Education Needs:  ANTICOAGULATION              Nursing Long Term Goals -      Patient will remain free of falls during hospitalization  Patient will be free from injury during hospitalization     Nursing Interventions:  Anticoagulation and Oral  Medication(s)    Is the patient making progress towards nursing goals?  Progressing    Comments/Issues:  none    The above nursing section completed by:  Dany    PHYSICAL THERAPY     Physical Therapy is ordered for this patient      Bed Mobility:  Supine scooting:  Modified independence    Supine to Sit:   Modified independence .   Cues for Hand placement.  Sit to Supine:   Modified independence .   Cues for Hand placement.     Transfers:  Sit to Stand:  Supervision with Front wheeled walker      Locomotion:  LEVEL AMBULATION:  Distance: 20'x2   Assistance level:  SBA   Device:  Front wheeled walker    Weight-bearing status: Full weight bearing            Long Term Goals:     1. Pt will verbalize 1 exercise/movement that she can do throughout the day to maintain good skin integrity on her sacrum in order to prevent skin breakdown during bedrest; MET  2. Pt will demonstrate ability to complete established HEP in order to maintain good strength in her LEs during bedrest; On going  3. Pt will amb >50' using FWW with S for increased (I) with household level mobility; On going    Physical Therapy for Exercise, Gait training, LE strengthening/ROM       Is the patient making progress towards physical therapy goals? Progressing    Comments/Issues:  Pt is progressing towards goals seen by increased distance, with increased endurance during exercises. Will continue to focus on increasing endurance for goal of ambulating >50' with FWW for increased (I) at home.     The above physical therapy section completed by:  Orlene Och, LPTA    OCCUPATIONAL THERAPY     Occupational Therapy is not ordered for this patient          SPEECH LANGUAGE PATHOLOGY     Speech/ Language Therapy is not ordered for this patient.       NUTRITION THERAPY     The patient Follow up trigger for a Nutrition Screen.    Orders Placed This Encounter   Procedures    Diet regular Na restriction, if any: 2 GM NA; Patient preferences: PATIENT CANNOT EAT RICE.      This patient Wt trending downwards a month ago it was about the same wt have a Significant Weight Change:  Wt Readings from Last 1 Encounters:   07/06/19 0705 80.2 kg (176 lb 12.9 oz)   07/05/19 0648 85.3 kg (188 lb 0.8 oz)   07/04/19 0710 82.1 kg (181 lb)   07/04/19 0534 85.9 kg (189 lb 6 oz)   07/03/19 0600 86.5 kg (190 lb 11.2 oz)   07/02/19 0647 84 kg (185 lb 3 oz)   07/01/19 0628 84.1 kg (185 lb 6.5 oz)   07/01/19 0600 84.1 kg (185 lb 6.5 oz)   06/30/19 0653 82.1 kg (181 lb)   06/29/19 0754 81.5 kg (179 lb 10.8 oz)   06/28/19 0659 81.8 kg (180 lb 5.4 oz)   06/26/19 1421 67.2 kg (148 lb 2.4 oz)   06/25/19 0639 80.8 kg (178 lb 2.1 oz)     The patients meal intake is inadequate at <50% for estimated needs               Comments/Issues: In the last couple days her PO intake decreased d/t her medical condition, SOB, and anxiety?  Nursing will continue to monitor weights  Will continue to monitor intakes  Patient to continue with diet plan.      The above nutrition therapy section completed by:  Oliver Hum, MS RD    RESPIRATORY THERAPY     Respiratory Therapy is ordered for this patient.     Patient, prior to admission, was on oxygen at 4 L/min via nasal cannula.     Respiratory Therapy for currently using a Nasal Cannula at 1 L/min and Albuterol nebulizer PRN.    Comments/Issues:  Patient is being weaned from oxygen per Dr Amada Jupiter.    The above respiratory therapy section completed by:  B. Ephriam Knuckles, RRT, CPFT    ACTIVITIES PLAN     Is the patient participating in activities?  Patient is capable of independently pursuing his/her own leisure activities without intervention from the facility.     The above activities plan section completed by:  Margie Billet, RN, BSN    CASE MANAGEMENT / SOCIAL SERVICE / DISCHARGE PLAN       Discharge Plan Update:  Home with family/other      DME to order:   Has needed equipment        Anticipated Discharge Services:    Home Health - for specific Home Health agency assignment,  please see CM/SW note.     A Patient/Family conference is not needed.      Plan communicated to: Dyane Dustman - Case management to discuss discharge disposition with patient and family (with patient's permission).    Anticipated date of discharge: TBD, pending progress and continued IV need    Scheduled Follow-up appointments: none at this time    The above case management/social service/discharge plan section completed by:  Harvel Quale RN BSN     Continued Skilled Care Involves the Following   (Section to be completed at the end of the team meeting)     Changes to the current plan of care:   no changes are needed to current plan of care    BARRIERS TO REHABILITATIVE GOALS  BARRIERS TO DISCHARGE  Unavailability of home and family support    Patient continues to demonstrate reasonable potential for functional and/ or medical improvement from intensive and comprehensive skilled services under the direction of attending physician.      Continue with the recommended plan of care as above per IDT members in attendance.

## 2019-07-07 NOTE — Progress Notes (Signed)
79 year old white female transitional care following hospitalization for cellulitis RLE and non healing stasis wounds to LLE.  Patient is alert and talkative today and is back to baseline mentally.  I spoke with therapist who is monitoring wound and providing treatment today.  Patient was weaned off oxygen and oxygen saturation remained stable but patient states unable to tolerate.  She is 99% on 1L NC today.  Respirations are not labored and she does not appear to be in any respiratory distress.  She has slightly increased LE edema again today.  I am going to restart spironolactone 25 mg daily.  Renal function has remained stable and we will continue to monitor CMP q weekly.  She continues with tubi grip compression hose to BLE.  She was unable to tolerate pressure of TED hose.  She appears at baseline mentally.  Staff were able to assist her to walk to bathroom today and she is very proud she was able to do so.  Urine culture reveals 10-50,000 colonies VRE.  Patient remains afebrile.  She continues on antibiotics for LE cellulitis.  She has no acute concerns today. She reports eating and drinking well this morning for breakfast.      BP 125/67   Pulse 72   Temp 97 F (36.1 C) (Oral)   Resp 17   Wt 80.2 kg (176 lb 12.9 oz)   SpO2 97%   BMI 34.53 kg/m     Results     Procedure Component Value Units Date/Time    Urine Culture [[id number]] Collected: 07/04/19 1015    Specimen: Urine, Random Updated: 07/07/19 1137    Narrative:      Specimen: Urine, Random  Collected: 07/04/2019 10:15     Status: Final      Last Updated: 07/07/2019 11:37                ISO #1 (Final)      Less than 10,000 CFU/mL      Candida albicans       ISO #2 (Final) (4)      10,000 - 50,000 CFU/mL      Enterococcus faecium      **Vancomycin Resistant Enterococci**      **VRE isolated.**      Results called and read back by (licensed clinician/date/time/tech):      Robbin Ricci,MT [id number]                               ISO #1                 ISO #2                            Candida albicans      **Vancomycin                                                  Resistant Enterococci*                                                  *                            --------------------- ---------------------  MIC (mcg/ml)      Ampicillin (AM)                             >=32             R      Fluconazole (FLU)                      S      Linezolid (LNZ)                             2                S      Nitrofurantoin (FD)                         128              R      Penicillin (P)                              >=64             R      Vancomycin (V)                              >=32             R     Antibiotic Summary Grid:                            AM   FLU  LNZ  FD   P    V     Candida albicans         S     **Vancomycin        R         S    R    R    R     Resistant     Enterococci**             Comprehensive metabolic panel [[id number]]  (Abnormal) Collected: 07/07/19 0656    Specimen: Plasma Updated: 07/07/19 0734     Sodium 139 mMol/L      Potassium 4.2 mMol/L      Chloride 97 mMol/L      CO2 31.0 mMol/L      Calcium 10.1 mg/dL      Glucose 90 mg/dL      Creatinine 4.78 mg/dL      BUN 24 mg/dL      Protein, Total 5.5 gm/dL      Albumin 2.9 gm/dL      Alkaline Phosphatase 134 U/L      ALT 19 U/L      AST (SGOT) 23 U/L      Bilirubin, Total 0.6 mg/dL      Albumin/Globulin Ratio 1.12 Ratio      Anion Gap 15.2 mMol/L      BUN / Creatinine Ratio 30.8 Ratio      EGFR 72 mL/min/1.62m2      Osmolality Calculated 281 mOsm/kg      Globulin 2.6 gm/dL     CBC and differential [[id number]]  (Abnormal) Collected: 07/07/19 6578  Specimen: Blood Updated: 07/07/19 0718     WBC 7.1 K/cmm      RBC 4.09 M/cmm      Hemoglobin 11.1 gm/dL      Hematocrit 29.5 %      MCV 91 fL      MCH 27 pg      MCHC 30 gm/dL      RDW 62.1 %      PLT CT 167 K/cmm      MPV 7.6 fL      Neutrophils % 77.3 %      Lymphocytes 11.3 %      Monocytes 10.1 %      Eosinophils % 0.2 %       Basophils % 1.2 %      Neutrophils Absolute 5.5 K/cmm      Lymphocytes Absolute 0.8 K/cmm      Monocytes Absolute 0.7 K/cmm      Eosinophils Absolute 0.0 K/cmm      Basophils Absolute 0.1 K/cmm         Review of Systems   Constitutional: Negative for chills, diaphoresis and fever.   HENT: Negative for congestion, nosebleeds, sinus pain and sore throat.    Eyes: Negative for pain, discharge and redness.   Respiratory: Negative for cough and wheezing.         Patient still reports feeling short of breath but does admit feeling much improved   Cardiovascular: Negative for chest pain.   Gastrointestinal: Negative for abdominal pain, constipation, diarrhea, heartburn, nausea and vomiting.   Genitourinary: Negative for dysuria and hematuria.   Neurological: Negative for dizziness, seizures and headaches.   Psychiatric/Behavioral: The patient is nervous/anxious.      Physical Exam   Constitutional: She is oriented to person, place, and time. No distress.   HENT:   Mouth/Throat: Oropharynx is clear and moist. No oropharyngeal exudate.   Native with partial upper and lower plates, no oral exudate noted today, small white lesion to right upper palate almost completely resolved, no open wounds are noted, no vesicles   Eyes: Pupils are equal, round, and reactive to light. Conjunctivae are normal. Right eye exhibits no discharge. Left eye exhibits no discharge.   Glasses   Neck: No JVD present.   Cardiovascular: Normal rate.   Irregular irregular rhythm   Pulmonary/Chest: Effort normal.   Becomes slightly labored with conversation, few faint wheezes noted posterior, no cough noted, O2 @ 1L NC   Abdominal: Soft. Bowel sounds are normal. She exhibits no distension. There is no abdominal tenderness. There is no rebound.   Musculoskeletal:      Comments: DJD changes, limited ROM at baseline due to OA, kyphosis, +1 to BLE, tubi grip socks bilaterally   Neurological: She is alert and oriented to person, place, and time.   Skin: Skin  is warm and dry. She is not diaphoretic.   Chronic stasis changes with hyperpigmentation to BLE.  Dry skin.     Psychiatric:   Slightly nervous affect at baseline, memory and judgement appear intact     Assessment and Plan    1. 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  Continue tubi grips as ordered.  Continue IV abx.    2.  CHF, chronic diastolic, continue furosemide as ordered, restart spironolactone.  Continue to monitor CMP q weekly.  K+ today 4.2.    3.  CKD 3, stable, most  recent GFR 72.    4.  Hypokalemia, continue po potassium supplementation, monitor CMP with restarting spironolactone.    5.  Oral candidiasis, continue nystatin as ordered.    6.  Cellulitis RLE, continue IV abx, continues to improve.    7.  Interstitial lung disease with Chronic respiratory failure, patient weaned down on oxygen, 99% on 1L NC.  Patient anxious about decrease in oxygen but tolerating well.  She had bathing and ambulated to BR for toileting and tolerated very well.  CO2 today 31.  Chronic steroid therapy, now on 5mg  daily.    8.  Depression with anxiety, continue sertraline and alprazolam as ordered.  Mood appears stable.    9.  Confusion, resolved, appears back to baseline today.  Oxycontin and gabapentin discontinued.  UA C & S negative for acute UTI, colonized VRE.  Afebrile.  No leukocytosis.    10.  Permanent atrial fibrillation, rate well controlled, continue metoprolol and apixaban as ordered.    11.  Osteoarthritis, chronic pain syndrome, continue oxycodone and acetaminophen prn.      Electronically signed:  Sharrell Ku. Mosie Epstein, FNP

## 2019-07-07 NOTE — Progress Notes (Signed)
Readmission Risk  [facility] - MEDICAL/SURGICAL   Patient Name: [name]   Attending Physician: Graciela Husbands, DO   Today's date:   07/07/2019 LOS: 13 days   Expected Discharge Date      Readmission Assessment:                                                              Discharge Planning  ReAdmit Risk Score: 45  Does the patient have perscription coverage?: (P) Yes  Confirmed PCP with Pt: (P) Yes  Confirmed PCP name: (P) Dr. Amada Jupiter  Last PCP Visit Date: (P) 06/11/19  Confirm Transport to F/U Appt.: (P) Self/Private Vehicle/Friend  Anticipated Home Health at [place]: (P) (HH vs outpatient)  Name of Home Health Agency Placement: [facility]  Referral made for home health RN visit?: Yes  Anticipated Placement at [place]: (P) No  CM Comments: (P) 3/8~potential discharge Thursday or Friday this week. ?HH vs outpatient for wound care       IDPA:   Patient Type  Within 30 Days of Previous Admission?: Yes(05/04/19-05/21/19 [facility])  Healthcare Decisions  Interviewed:: Patient  Orientation/Decision Making Abilities of Patient: Alert and Oriented x3, able to make decisions  Prior to admission  Prior level of function: Independent with ADLs, Ambulates with assistive device  Type of Residence: Private residence  Home Layout: One level, Ramped entrance  Have running water, electricity, heat, etc?: Yes  Living Arrangements: Children  How do you get to your MD appointments?: family/friends  How do you get your groceries?: family  Who fixes your meals?: family  Who does your laundry?: family  Who picks up your prescriptions?: family  DME Currently at Home: ADL- 3-in-1 Bedside [employer], [place], [employer], Hospital Bed, Portable O2 Tank, Home O2, [place], Single Point  Home Care/Community Services: Home Health  Name of Agency: [facility]  Frequency of services: 3 times a week  Adult Management consultant (APS) involved?: No  Discharge Planning  Support Systems: Children, Family members  Patient  expects to be discharged to:: home  Anticipated [place] plan discussed with:: Same as interviewed  Does the patient have perscription coverage?: (P) Yes  Consults/Providers  Correct PCP listed in Epic?: Yes  Family and PCP  PCP on file was verified as the current PCP?: Yes   30 Day Readmission:       Provider Notifications:            07/07/19 1326   Discharge Planning   Does the patient have perscription coverage? Yes   Confirmed PCP with Pt Yes   Confirmed PCP name Dr. Amada Jupiter   Last PCP Visit Date 06/11/19   Confirm Transport to F/U Appt. Self/Private Vehicle/Friend   Anticipated Home Health at [place]   [facility] vs outpatient)   Anticipated Placement at [place] No   CM Comments 3/8~potential discharge Thursday or Friday this week. ?HH vs outpatient for wound care

## 2019-07-08 NOTE — Progress Notes (Signed)
CM spoke with patient this morning regarding a possible discharge later on this week. I told her that per my conversation with Dr. Amada Jupiter he said possibly Thursday or Friday. We did discuss patient following up with outpatient therapy for continued wound care and she asked CM if this is something that she would have to be brought to. CM did inform her that yes, she would have to come into the rehab center and she states that she would rather continue with home health instead of outpatient. CM will update Dr. Amada Jupiter at rounds this morning.

## 2019-07-09 NOTE — Respiratory Progress Note (Signed)
Patient's SpO2 was 98% on a 1L NC, however, patient stated that she wanted to keep the NC on.

## 2019-07-09 NOTE — Progress Notes (Signed)
[name] is awake and alert (alert and oriented x3) at time of assessment. She was medicated per [facility] for pain in the RLE. All morning medications administered without difficulties. RLE dressing clean, dry and intact; due to be changed 07/10/19. 1L via NC in place; no s/s or complaints of SOB. Bed in low and locked position with alarm on for safety. Call bell and all personal belongings are within reach    Visit Vitals  BP 133/71   Pulse 86   Temp 98.2 F (36.8 C) (Oral)   Resp 18   Wt 83.4 kg (183 lb 13.8 oz)   SpO2 96%   BMI 35.91 kg/m   .

## 2019-07-09 NOTE — Progress Notes (Signed)
In to speak with patient re: discharge home on Friday. Patient agreeable to this plan and granted permission for CM to speak with family re: discharge/transportation. Discussed with patient outpatient therapy for wound care. Informed her that Dr. Amada Jupiter preferred that she attend outpatient as he feels that this would be a better option for wound care/healing. Patient states that she would have transportation to outpatient if it were before lunch; however, she voices the barrier being exhaustion- "it just tires me out too bad". Patient's preference is to receive [facility] for wound care needs. Informed patient that we would relay this to Dr. Amada Jupiter but that she should also discuss her concerns with him. Will call family and plan for discharge on Friday, 07/11/19. Per patient, it will likely be after Elnita Maxwell gets off of work around [employer]. Patient noted to have portable oxygen tank in closet for transport home. NOMNC reviewed, verbally consented and placed in chart. Call placed to Cheryl's home and mobile phone. Message left requesting a return phone call. Will await response.      07/09/19 1153   CM Review   Date of most recent [facility] given: 07/09/19  (d/c 07/11/19)

## 2019-07-10 MED ORDER — METOPROLOL SUCCINATE ER 100 MG PO TB24
100.00 mg | ORAL_TABLET | Freq: Every day | ORAL | 11 refills | Status: AC
Start: 2019-07-11 — End: ?

## 2019-07-10 MED ORDER — SERTRALINE HCL 50 MG PO TABS
50.00 mg | ORAL_TABLET | Freq: Every evening | ORAL | 11 refills | Status: AC
Start: 2019-07-10 — End: ?

## 2019-07-10 MED ORDER — APIXABAN 5 MG PO TABS
5.00 mg | ORAL_TABLET | Freq: Two times a day (BID) | ORAL | 11 refills | Status: AC
Start: 2019-07-10 — End: ?

## 2019-07-10 MED ORDER — POLYETHYLENE GLYCOL 3350 17 G PO PACK
17.00 g | PACK | Freq: Every day | ORAL | 5 refills | Status: AC
Start: 2019-07-11 — End: ?

## 2019-07-10 MED ORDER — ALLOPURINOL 300 MG PO TABS
300.00 mg | ORAL_TABLET | Freq: Every day | ORAL | 11 refills | Status: AC
Start: 2019-07-11 — End: ?

## 2019-07-10 MED ORDER — PREDNISONE 5 MG PO TABS
5.00 mg | ORAL_TABLET | Freq: Every morning | ORAL | 5 refills | Status: AC
Start: 2019-07-11 — End: ?

## 2019-07-10 MED ORDER — ALPRAZOLAM 0.25 MG PO TABS
0.1250 mg | ORAL_TABLET | Freq: Four times a day (QID) | ORAL | 1 refills | Status: DC | PRN
Start: 2019-07-10 — End: 2019-08-09

## 2019-07-10 NOTE — Progress Notes (Addendum)
Updated AVS with [facility] information. F2F loaded to [facility] for [facility]

## 2019-07-10 NOTE — Discharge Instr - AVS First Page (Addendum)
Please use coupon you were given for apixaban at your pharmacy.  First month will be free.  Second month your doctor will help you to get prior authorization and there will be a copay.  The third month the cost will be less.    STOP taking coumadin.  Apixaban is the new blood thinner you are on.  You do NOT need to go to coumadin clinic to have blood work done anymore.      Keep LEG ELEVATED AS MUCH AS POSSIBLE    Home Health Nurses will obtain (BMP) blood work in approximately one week to send to Dr. Amada Jupiter    Please weigh yourself daily     HOME HEALTH     [facility] has been arranged to see you for home health care. The home health nurse will call you to arrange your first visit once you are discharged. If you have any questions prior to the visit please call the home health agency at [phone].

## 2019-07-10 NOTE — Progress Notes (Signed)
CM called and left voicemail for patients daughter Reggy Eye ([phone]) to discuss discharge for tomorrow. CM will follow up.

## 2019-07-10 NOTE — Progress Notes (Signed)
CM talked to patients daughter Reggy Eye [phone]. We discussed Eliquis card for free 30 day supply, having a high deductible the following month and then the 3rd month she will have a lower copay. Daughter verbalized understanding. CM also talked to her about patient needing to elevate her BLE as much as possible. She states that she doesn't feel like her mother is going to be doing much mobilizing when she gets home. CM informed her that we were getting home health through [facility] again. Elnita Maxwell states that her husband will be here to pick up patient around 1 pm tomorrow. CM will update nursing staff.

## 2019-07-10 NOTE — Plan of Care (Signed)
See PT discharge summary for details.

## 2019-07-10 NOTE — Discharge Summary (Signed)
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   Chronic diastolic CHF (congestive heart failure)   Chronic interstitial lung disease   Chronic venous insufficiency   Permanent atrial fibrillation   Pulmonary HTN   Venous stasis ulcer of right calf with fat layer exposed without varicose veins   Subtherapeutic international normalized ratio (INR)   Stage 3a chronic kidney disease      Hospital Problems:  Active Problems:    Permanent atrial fibrillation    Chronic diastolic CHF (congestive heart failure)    Pacemaker    Chronic interstitial lung disease    Chronic respiratory failure with hypoxia, on home O2 therapy    Pulmonary HTN    Chronic venous insufficiency    Bilateral lower extremity edema    Stage 3a chronic kidney disease    Cellulitis of right leg    Venous stasis ulcer of right calf with fat layer exposed without varicose veins    Cellulitis of right lower extremity    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    Subtherapeutic international normalized ratio (INR)      Problem Lists:  Patient Active Problem List   Diagnosis    CHF (congestive heart failure)    Permanent atrial fibrillation    Hyperkalemia    Chronic diastolic CHF (congestive heart failure)    On home oxygen therapy    Aortic regurgitation    Pacemaker    Pacemaker at end of battery life    Diaphragmatic eventration    Chronic interstitial lung disease    Thoracic aortic aneurysm without rupture    Difficulty breathing    Chronic respiratory  failure with hypoxia, on home O2 therapy    Nonobstructive atherosclerosis of coronary artery    SSS (sick sinus syndrome)    Drug-induced constipation    Moderate asthma with acute exacerbation    Pulmonary HTN    Chronic venous insufficiency    Bilateral lower extremity edema    Diastolic CHF, acute on chronic    Venous stasis ulcer of right ankle with fat layer exposed without varicose veins    Stage 3a chronic kidney disease    Cellulitis of right leg    Hyponatremia    Severe pain    Venous stasis ulcer of right calf with fat layer exposed without varicose veins    Cellulitis of right lower extremity    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  Venous stasis ulcer to RLE, continue iodosorb gel and elevation as ordered.  Patient has been instructed at length to keep leg elevated as much as possible once returning to home.  Home Health Nursing referral for wound care.    2.  Cellulitis, resolved, no need for further antibiotics.    3.  Atrial fibrillation, permanent, rate well controlled with metoprolol and dose  increased.  Cardizem discontinued due to LE edema.  Coumadin discontinued and transitioned to apixaban due to inability to maintain therapeutic INR.    4.  Interstitial lung disease with chronic respiratory failure and oxygen supplementation at 1L NC.  Patient was previously receiving oxygen at 4L at home.  Some of her dyspnea appears related to anxiety which has been addressed during hospitalization.    5.  Anxiety, continue low dose alprazolam at discharge.    6.  Depression with anxiety, significantly improved with addition of sertraline.    7.  Chronic diastolic heart failure complicated by valve disease, mitral regurgitation,  pulmonary hypertension, and sick sinus syndrome with pacemaker.  Compensated.  Continue furosemide, spironolactone and prn metolazone at home.  Patient instructed on daily  weight.    8.  Hypokalemia, stable, continue po potassium supplementation.  BMP in one week by home health nursing to continue to monitor with results sent to Dr. Amada Jupiter.    9.  Hyponatremia, mild stable, recheck BMP in one week.  Most recent Na 139.  10.  Anemia, stable, most recent H & H WNL.    11.  Constipation, continue miralax as ordered.    12.  Gout, no recent flares, continue allopurinol as ordered.    13.  Glaucoma, no acute vision changes, continue ey gtts as ordered.        Hospital Course:   79 year old white female transitional care since 06/24/19 following hospitalization 06/20/19 for cellulitis RLE and non healing full thickness stasis wounds to RLE.  Patient has completed IV abx with resolution of cellulitis. She received zosyn and vancomycin and then ceftriaxone.  She continues with stasis wound to right medial calf area above malleolus.  Wound is slowly improving with epithelial tissue noted in wound bed.  Proximal area of wound still with small area of eschar but improving.  Distal portion of wound has clean wound bed with no slough and no evidence of bacterial infection.  There is a small round punched out wound towards the shin area that is scabbed and smaller in size with no drainage noted.  Surrounding skin has no warmth to touch.  She has chronic stasis changes with hyperpigmentation to BLE and dry flaking skin.  The wound does remain tender to palpation. Patient with interstitial lung disease with chronic respiratory failure and continuous oxygen supplementation at home at 4L NC.  Patient weaned down to 1L oxygen nasal cannula and has been tolerating well. She received IV furosemide for increased LE edema which is resolved at this time.  She will return home with po furosemide and spironolactone with prn metolazone.  Renal function remains stable.  We have asked home health nursing to obtain BMP in one week and send results to Dr. Graciela Husbands.  She was not able to tolerate TED compression hose  but has been using tubi grips.  Patient with permanent atrial fibrillation and chronic anticoagulation with difficulty maintaining therapeutic INR transitioned from coumadin to apixaban and has tolerated well.  Patient with chronic using hydrocodone at home.  She was experiencing increased pain but was not able to tolerate long acting oxycontin or gabapentin during hospitalization.  She will be returning home where she lives with daughter.  Patient reports daughter is not always able to help her at home and does still work outside the home.  Home Health Nursing will be providing wound care.      Discharge Medications:     Current Discharge Medication List  START taking these medications    Details   ALPRAZolam (XANAX) 0.25 MG tablet Take 0.5 tablets (0.125 mg total) by mouth every 6 (six) hours as needed for Anxiety  Qty: 15 tablet, Refills: 1      apixaban (ELIQUIS) 5 MG Take 1 tablet (5 mg total) by mouth every 12 (twelve) hours  Qty: 60 tablet, Refills: 11      polyethylene glycol (MIRALAX) 17 g packet Take 17 g by mouth daily  Qty: 30 packet, Refills: 5      sertraline (ZOLOFT) 50 MG tablet Take 1 tablet (50 mg total) by mouth nightly  Qty: 30 tablet, Refills: 11         CONTINUE these medications which have CHANGED    Details   allopurinol (ZYLOPRIM) 300 MG tablet Take 1 tablet (300 mg total) by mouth daily  Qty: 30 tablet, Refills: 11      metoprolol succinate (TOPROL-XL) 100 MG 24 hr tablet Take 1 tablet (100 mg total) by mouth daily  Qty: 30 tablet, Refills: 11      predniSONE (DELTASONE) 5 MG tablet Take 1 tablet (5 mg total) by mouth every morning with breakfast  Qty: 30 tablet, Refills: 5         CONTINUE these medications which have NOT CHANGED    Details   albuterol (PROVENTIL) (2.5 MG/3ML) 0.083% nebulizer solution Take 2.5 mg by nebulization every 6 (six) hours as needed for Wheezing.      fluorometholone (EFLONE) 0.1 % ophthalmic suspension Place 1 drop into the left eye 4 (four) times daily          HYDROcodone-acetaminophen (NORCO) 5-325 MG per tablet Take 1 tablet by mouth every 6 (six) hours as needed for Pain  Qty: 90 tablet, Refills: 0      latanoprost (XALATAN) 0.005 % ophthalmic solution Place 1 drop into both eyes nightly         metOLazone (ZAROXOLYN) 2.5 MG tablet Take 1 tablet (2.5 mg total) by mouth daily as needed (if you gain 5 pounds)  Qty: 30 tablet, Refills: 0      potassium chloride (K-DUR) 10 MEQ tablet Take 1 tablet (10 mEq total) by mouth daily  Qty: 30 tablet, Refills: 11      rosuvastatin (CRESTOR) 10 MG tablet Take 1 tablet (10 mg total) by mouth daily  Qty: 90 tablet, Refills: 3      spironolactone (ALDACTONE) 25 MG tablet Take 1 tablet (25 mg total) by mouth daily  Qty: 30 tablet, Refills: 11         STOP taking these medications       diltiazem (TIAZAC) 240 MG 24 hr capsule        fluticasone furoate-vilanterol (BREO ELLIPTA) 100-25 MCG/INH Aerosol Pwdr, Breath Activated        furosemide (LASIX) 40 MG tablet        tiotropium (SPIRIVA RESPIMAT) 2.5 MCG/ACT inhalation spray        warfarin (COUMADIN) 3 MG tablet                Discharge Instructions:     Follow up with Dr. Amada Jupiter in several months per your discussion.      I spent greater than 30 minutes preparing on this discharge.     Electronically Signed by: [name] Mcgregor, [name]

## 2019-07-10 NOTE — Progress Notes (Signed)
1900-0700    A&Ox3. Forgetful at times. Anxious and works herself up. Xanax given once. 1L O2 via NC in use. Up to [facility] x1 and walker PRN. BLE elevated on pillows. Roxi given once for leg pain. Tums given once for indigestion. Call bell within reach. Side rails up x2.

## 2019-07-10 NOTE — PT Progress Note (Signed)
[facility]  Department of Rehabilitation Services  [phone]    [facility]     Physical Therapy Transitional Care Discharge Note    [name]    CSN: [id number]    MEDICAL/SURGICAL   203/203-A    Date:  07/10/19                                                                           Precautions and Contraindications:   Fall risk     History of Present Illness:   Medical Diagnosis: Pain, unspecified [R52]  Cellulitis of right lower extremity [L03.115]    Therapy Diagnosis:  Muscle weakness    [name] is a 79 y.o. female admitted on 06/24/2019 with need for continued IV antibiotics due to cellulitis of RLE.PT ordered in order to instruct pt in bed level exercises to prevent loss of strength/function while on bedrest.        Patient Active Problem List   Diagnosis    CHF (congestive heart failure)    Permanent atrial fibrillation    Hyperkalemia    Chronic diastolic CHF (congestive heart failure)    On home oxygen therapy    Aortic regurgitation    Pacemaker    Pacemaker at end of battery life    Diaphragmatic eventration    Chronic interstitial lung disease    Thoracic aortic aneurysm without rupture    Difficulty breathing    Chronic respiratory failure with hypoxia, on home O2 therapy    Nonobstructive atherosclerosis of coronary artery    SSS (sick sinus syndrome)    Drug-induced constipation    Moderate asthma with acute exacerbation    Pulmonary HTN    Chronic venous insufficiency    Bilateral lower extremity edema    Diastolic CHF, acute on chronic    Venous stasis ulcer of right ankle with fat layer exposed without varicose veins    Stage 3a chronic kidney disease    Cellulitis of right leg    Hyponatremia    Severe pain    Venous stasis ulcer of right calf with fat layer exposed without varicose veins    Cellulitis of right lower extremity    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    Subtherapeutic international  normalized ratio (INR)        Past Medical/Surgical History:  Past Medical History        Past Medical History:   Diagnosis Date    Aortic aneurysm     Atrial fibrillation     Chronic obstructive pulmonary disease     Coronary artery disease     Gastroesophageal reflux disease     Hypertension     Malignant neoplasm     Pacemaker     SSS (sick sinus syndrome)          Past Surgical History         Past Surgical History:   Procedure Laterality Date    CARDIAC PACEMAKER PLACEMENT      2002    HYSTERECTOMY              Social History:   Home Living Arrangements:  Living Arrangements: Family members, available 24/7  Home Layout: Ramped entrance, Able to live on main level with bedroom and bathroom    Prior Level of Function:  Household ambulation with FWW with Supervision from family     DME available at home:  Front Wheeled [facility]   [facility] bed   Home O2- 2L      Examination of Body Systems (Structures, Function, Activity and Participation)                            Pain:                                                     With Activity: 5-7/10  Location: Leg:  right  Interventions: Medication (see eMAR), BLEs elevated above heart in bed following PT session     Edema/Skin inspection:  Edema:  No pedal edema noted  No upper extremity edema noted  BLE skin very dry     Skin inspection:  BLE demonstrate hemosiderin staining distally; buttocks/sacrum demonstrates purple discoloration; Distal RLE dressing intact  See nursing notes/flowsheets for additional documentation.     Posture:  Forward Head:  seated    Musculoskeletal Examination:     Functional Mobility:  Bed Mobility:  Supine <> Sit: MOD I with use of hospital bed features     Transfers:  Sit <> Stand:  MOD I with Front wheeled walker     Locomotion:  LEVEL AMBULATION:  Distance: 20-50'   Assistance level:  S, for cueing to improve O2 tubing  monitoring/adjusting   Device:  Front wheeled walker    Neurology:   Cognition:   Command following: Follows ALL commands and directions without difficulty            Behavior:  Cooperative, Anxious    Motor Planning/Coordination:  Motor Planning:  Intact  Coordination: Within Normal Limits (WNL)      Sensation:                                 Patient reportingNo Numbness or Paresthesia  Sensation isintact ,to light touch BLEs    Balance:   Static Sitting:  Good  Dynamic Sitting:  Good  Static Standing:  Good  Dynamic Standing: Fair+ (able to stand/reach/move without UE support during standing activity however UE support is needed with amb)               Participation and Activity Tolerance   Participation effort: Excellent  Activity Tolerance: Tolerates 45 minutes of activity with multiple rests    Education Provided:   TOPICS: monitoring of O2 tubing, plan for home health PT, always having supervision during all ambulation for max safety      Learner educated: Patient  Method: Explanation and Demonstration  Response to education: Verbalized understanding, Needs reinforcement     Team Communication:   Interdisciplinary Communication has been provided through daily rounds, weekly meetings and whiteboard communication.  PT/PTA communication: via written note and verbal communication as needed.    Assessment:    [name] was admitted 06/24/2019 with need for continued IV antibiotics due to cellulitis of RLE and PT to reduce mm atropy/decreased functional mobility during bedrest. Pt has participated well during skilled PT interventions despite her  elevated levels of anxiety and difficulty breathing. SpO2 today noted to be 95% on 2L during ambulation with pt encouraged to take seated rest breaks and use PLB strategy to reduce SOB. Pt able to walk household level distances up to 50'. She was able to get in and out of bed using hospital bed features with MOD I. Sit <> stand transfers were  completed with MOD I also using a FWW. One area of concern is pt's monitoring and adjusting of her O2 tubing. Verbal cueing and at times, assistance, is needed to keep tubing from wrapping up in legs of walker. At this time, pt is being discharged home tomorrow where she will have 24/7 supervision (I confirmed this with case management). She will continue with home health PT to facilitate a safe return home and further educate pt and family about O2 tubing during amb.         Goals:   STGs to be met in 1 week:  1. Pt will verbalize 1 exercise/movement that she can do throughout the day to maintain good skin integrity on her sacrum in order to prevent skin breakdown during bedrest; MET  2. Pt will demonstrate ability to complete established HEP in order to maintain good strength in her LEs during bedrest; MET during hospitalization   3. Pt will amb >50' using FWW with S for increased (I) with household level mobility; MET        Plan of Care:   D/C PT         DISCHARGE RECOMMENDATIONS   DME recommended for Discharge:   None - has needed equipment     Discharge Recommendations:   Home Health PT  Direct level supervision during all OOB mobility        Alvino Chapel, DPT

## 2019-07-11 ENCOUNTER — Telehealth (RURAL_HEALTH_CENTER): Payer: Self-pay | Admitting: Internal Medicine

## 2019-07-11 NOTE — Telephone Encounter (Signed)
Pt to be seen in 3 days for hosp f u per dr Amada Jupiter. Please call pt to schedule.

## 2019-07-11 NOTE — Progress Notes (Signed)
Patient discharging home today. Family to transport around 1 pm. [facility] has been ordered and F2F loaded in [place].      07/11/19 1153   Discharge Disposition   Patient preference/choice provided? Yes   Physical Discharge Disposition Home, Home Health   Name of Home Health Agency Placement [facility] - Home Health Services   Mode of Transportation Car   Patient agreeable to discharge plan/expected d/c date? Yes   Family/POA agreeable to discharge plan/expected d/c date? Yes   Outpatient Services   Home Health Skilled Nursing;Home PT/OT/ST   CM Interventions   Follow up appointment scheduled? Yes  (May 12th)   Multidisciplinary rounds/family meeting before d/c? Yes   Medicare Checklist   Is this a Medicare patient? Yes

## 2019-07-11 NOTE — Discharge Instr - Appointments (Signed)
Office will call Monday to schedule follow-up appointment.

## 2019-07-11 NOTE — Progress Notes (Signed)
Patient was given information on diet, activity and medications. Patient stated she understood and that her daughter would help her with them. Patient was given covid vaccination card and Eliquis "discount" card.  IV's were pulled, catheters intact. Patient's belongings were packed and patient left the unit ar 1315.

## 2019-07-11 NOTE — Discharge Instr - Activity (Signed)
Activity as tolerated

## 2019-07-11 NOTE — Discharge Instr - Diet (Signed)
Regular diet, salt restriction (no added salt)

## 2019-07-14 NOTE — Progress Notes (Signed)
Called pt to schedule 2nd Covid vaccine.  Spoke to daughter Elnita Maxwell pts daughter/caregiver and signed pt up for 2nd Covid vaccine [employer]) on 07/24/2019 at 1355 at [employer] [facility].  Link used sent by Justin Mend to register pt, confirmation obtained.  Pts daughter verbalized understanding of information and had no questions.  Daughter was instructed to take vaccination card with her to appointment.

## 2019-07-15 ENCOUNTER — Other Ambulatory Visit (RURAL_HEALTH_CENTER): Payer: Self-pay

## 2019-07-16 MED ORDER — CURITY IODOFORM PACKING STRIP MISC
2 refills | Status: AC
Start: 2019-07-16 — End: ?

## 2019-07-16 NOTE — Telephone Encounter (Signed)
Pt in home health.

## 2019-07-17 ENCOUNTER — Other Ambulatory Visit (RURAL_HEALTH_CENTER): Payer: Self-pay

## 2019-07-17 ENCOUNTER — Telehealth (RURAL_HEALTH_CENTER): Payer: Self-pay | Admitting: Internal Medicine

## 2019-07-17 NOTE — Telephone Encounter (Signed)
Call from [place] that wants to speak with [name]. Reference to a iodine cream HH has been putting on sore on pts legs and [facility] Also [facility]codone [facility]. Can reach [place] at [phone].

## 2019-07-18 ENCOUNTER — Other Ambulatory Visit
Admission: RE | Admit: 2019-07-18 | Discharge: 2019-07-18 | Disposition: A | Discharge status: Home / Self Care | Payer: Medicare Other | Source: Ambulatory Visit | Attending: Internal Medicine | Admitting: Internal Medicine

## 2019-07-18 DIAGNOSIS — L03115 Cellulitis of right lower limb: Secondary | ICD-10-CM | POA: Insufficient documentation

## 2019-07-18 LAB — BASIC METABOLIC PANEL
Anion Gap: 15.4 mMol/L (ref 7.0–18.0)
BUN / Creatinine Ratio: 32.5 Ratio — ABNORMAL HIGH (ref 10.0–30.0)
BUN: 25 mg/dL — ABNORMAL HIGH (ref 7–22)
CO2: 29 mMol/L (ref 20.0–30.0)
Calcium: 10.3 mg/dL (ref 8.5–10.5)
Chloride: 100 mMol/L (ref 98–110)
Creatinine: 0.77 mg/dL (ref 0.60–1.20)
EGFR: 73 mL/min/{1.73_m2} (ref 60–150)
Glucose: 109 mg/dL — ABNORMAL HIGH (ref 71–99)
Osmolality Calculated: 284 mOsm/kg (ref 275–300)
Potassium: 4.4 mMol/L (ref 3.5–5.3)
Sodium: 140 mMol/L (ref 136–147)

## 2019-07-18 MED ORDER — IODOSORB 0.9 % EX GEL
Freq: Every day | CUTANEOUS | 1 refills | Status: AC | PRN
Start: 2019-07-18 — End: ?

## 2019-07-18 MED ORDER — HYDROCODONE-ACETAMINOPHEN 5-325 MG PO TABS
1.0000 | ORAL_TABLET | Freq: Four times a day (QID) | ORAL | 0 refills | Status: DC | PRN
Start: 2019-07-18 — End: 2019-08-18

## 2019-07-21 ENCOUNTER — Ambulatory Visit (RURAL_HEALTH_CENTER): Payer: Self-pay | Admitting: Nurse Practitioner

## 2019-07-21 NOTE — Progress Notes (Deleted)
No show

## 2019-07-28 ENCOUNTER — Encounter (RURAL_HEALTH_CENTER): Payer: Self-pay | Admitting: Nurse Practitioner

## 2019-08-07 ENCOUNTER — Observation Stay
Admission: EM | Admit: 2019-08-07 | Discharge: 2019-08-09 | Disposition: A | Discharge status: Home Health | Payer: Medicare Other | Attending: Internal Medicine | Admitting: Internal Medicine

## 2019-08-07 ENCOUNTER — Emergency Department: Payer: Medicare Other

## 2019-08-07 DIAGNOSIS — F0390 Unspecified dementia without behavioral disturbance: Secondary | ICD-10-CM | POA: Insufficient documentation

## 2019-08-07 DIAGNOSIS — I495 Sick sinus syndrome: Secondary | ICD-10-CM | POA: Insufficient documentation

## 2019-08-07 DIAGNOSIS — N1831 Chronic kidney disease, stage 3a: Secondary | ICD-10-CM | POA: Insufficient documentation

## 2019-08-07 DIAGNOSIS — Z950 Presence of cardiac pacemaker: Secondary | ICD-10-CM | POA: Insufficient documentation

## 2019-08-07 DIAGNOSIS — Z882 Allergy status to sulfonamides status: Secondary | ICD-10-CM | POA: Insufficient documentation

## 2019-08-07 DIAGNOSIS — F419 Anxiety disorder, unspecified: Secondary | ICD-10-CM | POA: Insufficient documentation

## 2019-08-07 DIAGNOSIS — Z7901 Long term (current) use of anticoagulants: Secondary | ICD-10-CM | POA: Insufficient documentation

## 2019-08-07 DIAGNOSIS — R4182 Altered mental status, unspecified: Secondary | ICD-10-CM

## 2019-08-07 DIAGNOSIS — R410 Disorientation, unspecified: Principal | ICD-10-CM | POA: Insufficient documentation

## 2019-08-07 DIAGNOSIS — I2510 Atherosclerotic heart disease of native coronary artery without angina pectoris: Secondary | ICD-10-CM | POA: Insufficient documentation

## 2019-08-07 DIAGNOSIS — I272 Pulmonary hypertension, unspecified: Secondary | ICD-10-CM | POA: Diagnosis present

## 2019-08-07 DIAGNOSIS — Z888 Allergy status to other drugs, medicaments and biological substances status: Secondary | ICD-10-CM | POA: Insufficient documentation

## 2019-08-07 DIAGNOSIS — K219 Gastro-esophageal reflux disease without esophagitis: Secondary | ICD-10-CM | POA: Insufficient documentation

## 2019-08-07 DIAGNOSIS — I4819 Other persistent atrial fibrillation: Secondary | ICD-10-CM | POA: Insufficient documentation

## 2019-08-07 DIAGNOSIS — H409 Unspecified glaucoma: Secondary | ICD-10-CM | POA: Insufficient documentation

## 2019-08-07 DIAGNOSIS — L97212 Non-pressure chronic ulcer of right calf with fat layer exposed: Secondary | ICD-10-CM | POA: Insufficient documentation

## 2019-08-07 DIAGNOSIS — J449 Chronic obstructive pulmonary disease, unspecified: Secondary | ICD-10-CM | POA: Insufficient documentation

## 2019-08-07 DIAGNOSIS — J9610 Chronic respiratory failure, unspecified whether with hypoxia or hypercapnia: Secondary | ICD-10-CM | POA: Insufficient documentation

## 2019-08-07 DIAGNOSIS — F329 Major depressive disorder, single episode, unspecified: Secondary | ICD-10-CM | POA: Insufficient documentation

## 2019-08-07 DIAGNOSIS — I872 Venous insufficiency (chronic) (peripheral): Secondary | ICD-10-CM | POA: Insufficient documentation

## 2019-08-07 DIAGNOSIS — E785 Hyperlipidemia, unspecified: Secondary | ICD-10-CM | POA: Insufficient documentation

## 2019-08-07 DIAGNOSIS — Z803 Family history of malignant neoplasm of breast: Secondary | ICD-10-CM | POA: Insufficient documentation

## 2019-08-07 DIAGNOSIS — J9611 Chronic respiratory failure with hypoxia: Secondary | ICD-10-CM | POA: Insufficient documentation

## 2019-08-07 DIAGNOSIS — Z9981 Dependence on supplemental oxygen: Secondary | ICD-10-CM | POA: Insufficient documentation

## 2019-08-07 DIAGNOSIS — M109 Gout, unspecified: Secondary | ICD-10-CM | POA: Insufficient documentation

## 2019-08-07 DIAGNOSIS — Z79899 Other long term (current) drug therapy: Secondary | ICD-10-CM | POA: Insufficient documentation

## 2019-08-07 DIAGNOSIS — I5032 Chronic diastolic (congestive) heart failure: Secondary | ICD-10-CM | POA: Insufficient documentation

## 2019-08-07 DIAGNOSIS — I130 Hypertensive heart and chronic kidney disease with heart failure and stage 1 through stage 4 chronic kidney disease, or unspecified chronic kidney disease: Secondary | ICD-10-CM | POA: Insufficient documentation

## 2019-08-07 DIAGNOSIS — Z885 Allergy status to narcotic agent status: Secondary | ICD-10-CM | POA: Insufficient documentation

## 2019-08-07 DIAGNOSIS — Z9079 Acquired absence of other genital organ(s): Secondary | ICD-10-CM | POA: Insufficient documentation

## 2019-08-07 LAB — CBC AND DIFFERENTIAL
Basophils %: 0.6 % (ref 0.0–3.0)
Basophils Absolute: 0.1 10*3/uL (ref 0.0–0.3)
Eosinophils %: 0 % (ref 0.0–7.0)
Eosinophils Absolute: 0 10*3/uL (ref 0.0–0.8)
Hematocrit: 38.8 % (ref 36.0–48.0)
Hemoglobin: 11.1 gm/dL — ABNORMAL LOW (ref 12.0–16.0)
Lymphocytes Absolute: 1.5 10*3/uL (ref 0.6–5.1)
Lymphocytes: 15.2 % (ref 15.0–46.0)
MCH: 26 pg — ABNORMAL LOW (ref 28–35)
MCHC: 29 gm/dL — ABNORMAL LOW (ref 31–36)
MCV: 92 fL (ref 80–100)
MPV: 10 fL (ref 6.0–10.0)
Monocytes Absolute: 0.7 10*3/uL (ref 0.1–1.7)
Monocytes: 7.4 % (ref 3.0–15.0)
Neutrophils %: 76.8 % (ref 42.0–78.0)
Neutrophils Absolute: 7.7 10*3/uL (ref 1.7–8.6)
PLT CT: 119 10*3/uL — ABNORMAL LOW (ref 130–440)
RBC: 4.21 10*6/uL (ref 3.80–5.00)
RDW: 17.8 % — ABNORMAL HIGH (ref 10.5–14.5)
WBC: 10 10*3/uL (ref 4.0–11.0)

## 2019-08-07 LAB — COMPREHENSIVE METABOLIC PANEL
ALT: 28 U/L (ref 0–55)
AST (SGOT): 32 U/L (ref 10–42)
Albumin/Globulin Ratio: 1.55 Ratio (ref 0.80–2.00)
Albumin: 3.4 gm/dL — ABNORMAL LOW (ref 3.5–5.0)
Alkaline Phosphatase: 144 U/L (ref 40–145)
Anion Gap: 12.1 mMol/L (ref 7.0–18.0)
BUN / Creatinine Ratio: 34.4 Ratio — ABNORMAL HIGH (ref 10.0–30.0)
BUN: 32 mg/dL — ABNORMAL HIGH (ref 7–22)
Bilirubin, Total: 1.7 mg/dL — ABNORMAL HIGH (ref 0.1–1.2)
CO2: 31 mMol/L — ABNORMAL HIGH (ref 20.0–30.0)
Calcium: 10.2 mg/dL (ref 8.5–10.5)
Chloride: 101 mMol/L (ref 98–110)
Creatinine: 0.93 mg/dL (ref 0.60–1.20)
EGFR: 58 mL/min/{1.73_m2} — ABNORMAL LOW (ref 60–150)
Globulin: 2.2 gm/dL (ref 2.0–4.0)
Glucose: 122 mg/dL — ABNORMAL HIGH (ref 71–99)
Osmolality Calculated: 288 mOsm/kg (ref 275–300)
Potassium: 4.1 mMol/L (ref 3.5–5.3)
Protein, Total: 5.6 gm/dL — ABNORMAL LOW (ref 6.0–8.3)
Sodium: 140 mMol/L (ref 136–147)

## 2019-08-07 LAB — VH ARTERIAL BLOOD GAS
Allen's Test: POSITIVE
Base Excess, Arterial: 5.2 mMol/L — ABNORMAL HIGH (ref <=2.0)
Carboxyhemoglobin: 1.4 % (ref 0.0–19.9)
HCO3, ABG: 30.1 mMol/L — ABNORMAL HIGH (ref 20.0–29.0)
O2 Lit Per Min: 4 L/min
O2 Sat, Arterial: 99.6 % (ref 96.0–100.0)
pCO2, Arterial: 45 mm Hg (ref 35.0–45.0)
pH, Arterial: 7.43 pH (ref 7.35–7.45)
pO2, Arterial: 130 mm Hg — ABNORMAL HIGH (ref 75–100)

## 2019-08-07 LAB — VH URINALYSIS WITH MICROSCOPIC AND CULTURE IF INDICATED
Bilirubin, UA: NEGATIVE mg/dL
Glucose, UA: NEGATIVE mg/dL
Ketones UA: NEGATIVE mg/dL
Leukocyte Esterase, UA: NEGATIVE Leu/uL
Nitrite, UA: NEGATIVE
Urine Specific Gravity: >=1.03 (ref 1.001–1.040)
Urobilinogen, UA: 0.2 mg/dL
pH, Urine: 5 pH (ref 5.0–8.0)

## 2019-08-07 MED ORDER — POLYETHYLENE GLYCOL 3350 17 G PO PACK
17.00 g | PACK | Freq: Every day | ORAL | Status: DC
Start: 2019-08-08 — End: 2019-08-09
  Administered 2019-08-08 – 2019-08-09 (×2): 17 g via ORAL
  Filled 2019-08-07 (×2): qty 1

## 2019-08-07 MED ORDER — ALLOPURINOL 100 MG PO TABS
300.0000 mg | ORAL_TABLET | Freq: Every day | ORAL | Status: DC
Start: 2019-08-08 — End: 2019-08-09
  Administered 2019-08-08 – 2019-08-09 (×2): 300 mg via ORAL
  Filled 2019-08-07 (×2): qty 3

## 2019-08-07 MED ORDER — VH SPIRONOLACTONE 25 MG PO TABS
25.0000 mg | ORAL_TABLET | Freq: Every day | ORAL | Status: DC
Start: 2019-08-08 — End: 2019-08-09
  Administered 2019-08-08 – 2019-08-09 (×2): 25 mg via ORAL
  Filled 2019-08-07 (×2): qty 1

## 2019-08-07 MED ORDER — IBUPROFEN 200 MG PO TABS
400.0000 mg | ORAL_TABLET | Freq: Four times a day (QID) | ORAL | Status: DC | PRN
Start: 2019-08-07 — End: 2019-08-09

## 2019-08-07 MED ORDER — PREDNISONE 5 MG PO TABS
5.0000 mg | ORAL_TABLET | Freq: Every morning | ORAL | Status: DC
Start: 2019-08-08 — End: 2019-08-09
  Administered 2019-08-08 – 2019-08-09 (×2): 5 mg via ORAL
  Filled 2019-08-07 (×2): qty 1

## 2019-08-07 MED ORDER — ACETAMINOPHEN 325 MG PO TABS
650.0000 mg | ORAL_TABLET | ORAL | Status: DC | PRN
Start: 2019-08-07 — End: 2019-08-09
  Administered 2019-08-07 – 2019-08-09 (×5): 650 mg via ORAL
  Filled 2019-08-07 (×5): qty 2

## 2019-08-07 MED ORDER — SERTRALINE HCL 50 MG PO TABS
50.0000 mg | ORAL_TABLET | Freq: Every evening | ORAL | Status: DC
Start: 2019-08-07 — End: 2019-08-09
  Administered 2019-08-07 – 2019-08-08 (×2): 50 mg via ORAL
  Filled 2019-08-07 (×2): qty 1

## 2019-08-07 MED ORDER — APIXABAN 5 MG PO TABS
5.0000 mg | ORAL_TABLET | Freq: Two times a day (BID) | ORAL | Status: DC
Start: 2019-08-07 — End: 2019-08-09
  Administered 2019-08-07 – 2019-08-09 (×4): 5 mg via ORAL
  Filled 2019-08-07 (×4): qty 1

## 2019-08-07 MED ORDER — ROSUVASTATIN CALCIUM 10 MG PO TABS
10.0000 mg | ORAL_TABLET | Freq: Every day | ORAL | Status: DC
Start: 2019-08-08 — End: 2019-08-09
  Administered 2019-08-08 – 2019-08-09 (×2): 10 mg via ORAL
  Filled 2019-08-07 (×2): qty 1

## 2019-08-07 MED ORDER — VH POTASSIUM CHLORIDE CRYS ER 10 MEQ PO TBCR (WRAP)
10.00 meq | EXTENDED_RELEASE_TABLET | Freq: Every day | ORAL | Status: DC
Start: 2019-08-08 — End: 2019-08-09
  Administered 2019-08-08 – 2019-08-09 (×2): 10 meq via ORAL
  Filled 2019-08-07 (×2): qty 1

## 2019-08-07 MED ORDER — NALOXONE HCL 0.4 MG/ML IJ SOLN (WRAP)
0.40 mg | INTRAMUSCULAR | Status: DC | PRN
Start: 2019-08-07 — End: 2019-08-09

## 2019-08-07 MED ORDER — LATANOPROST 0.005 % OP SOLN
1.00 [drp] | Freq: Every evening | OPHTHALMIC | Status: DC
Start: 2019-08-07 — End: 2019-08-09
  Administered 2019-08-07 – 2019-08-08 (×2): 1 [drp] via OPHTHALMIC
  Filled 2019-08-07: qty 1

## 2019-08-07 MED ORDER — ALBUTEROL SULFATE (2.5 MG/3ML) 0.083% IN NEBU
2.50 mg | INHALATION_SOLUTION | Freq: Four times a day (QID) | RESPIRATORY_TRACT | Status: DC
Start: 2019-08-08 — End: 2019-08-09
  Administered 2019-08-08 – 2019-08-09 (×5): 2.5 mg via RESPIRATORY_TRACT
  Filled 2019-08-07 (×5): qty 3

## 2019-08-07 MED ORDER — METOPROLOL SUCCINATE ER 50 MG PO TB24
100.00 mg | ORAL_TABLET | Freq: Every day | ORAL | Status: DC
Start: 2019-08-08 — End: 2019-08-09
  Administered 2019-08-08 – 2019-08-09 (×2): 100 mg via ORAL
  Filled 2019-08-07 (×2): qty 2

## 2019-08-07 MED ORDER — SODIUM CHLORIDE (PF) 0.9 % IJ SOLN
3.00 mL | Freq: Three times a day (TID) | INTRAMUSCULAR | Status: DC
Start: 2019-08-07 — End: 2019-08-09
  Administered 2019-08-07 – 2019-08-09 (×4): 3 mL via INTRAVENOUS

## 2019-08-07 NOTE — ED Provider Notes (Signed)
Physician/Midlevel provider first contact with patient: 08/07/19 1517         History     Chief Complaint   Patient presents with   . Altered Mental Status     Patient brought in for evaluation of altered mental status.  Patient family called for EMS to have her transported.  Patient herself only complains of just not feeling well.  Patient is oriented to self and place but not to date.  In asking patient questions, her results do not seem reliable.  They do not always present consistently.  Therefore an accurate HPI and accurate review of systems are not obtainable due to her altered mental state.  However personnel here in the hospital who do know this patient or the department patient is not "herself".               Past Medical History:   Diagnosis Date   . Aortic aneurysm    . Atrial fibrillation    . Chronic obstructive pulmonary disease    . Coronary artery disease    . Gastroesophageal reflux disease    . Hypertension    . Malignant neoplasm    . Pacemaker    . SSS (sick sinus syndrome)        Past Surgical History:   Procedure Laterality Date   . CARDIAC PACEMAKER PLACEMENT      2002   . HYSTERECTOMY         Family History   Problem Relation Age of Onset   . Breast cancer Sister        Social  Social History     Tobacco Use   . Smoking status: Never Smoker   . Smokeless tobacco: Never Used   Substance Use Topics   . Alcohol use: No   . Drug use: No       .     Allergies   Allergen Reactions   . Adhesive [Wound Dressing Adhesive]    . Codeine Hives     unknown     . Fentanyl      Other reaction(s): psychological reaction  Hallucinations,    . Morphine    . Morphine And Related Hives     unknown     . Nitroglycerin      Other reaction(s): neurological reaction   . Pain Patch [Menthol]    . Sulfa Antibiotics      unknown       Home Medications         Status Comment        08/07/2019  3:21 PM    Per family's list sent in with EMS.                albuterol (PROVENTIL) (2.5 MG/3ML) 0.083% nebulizer solution      Take 2.5 mg by nebulization every 6 (six) hours as needed for Wheezing.     allopurinol (ZYLOPRIM) 300 MG tablet     Take 1 tablet (300 mg total) by mouth daily     ALPRAZolam (XANAX) 0.25 MG tablet     Take 0.5 tablets (0.125 mg total) by mouth every 6 (six) hours as needed for Anxiety     apixaban (ELIQUIS) 5 MG     Take 1 tablet (5 mg total) by mouth every 12 (twelve) hours     cadexomer iodine (Iodosorb) 0.9 % gel     Apply topically daily as needed for Wound Care Use as directed  fluorometholone (EFLONE) 0.1 % ophthalmic suspension     Place 1 drop into the left eye 4 (four) times daily        Gauze Pads & Dressings (Curity Iodoform Packing Strip) Misc     Use to pack wound on Mon, Wed and Fri     HYDROcodone-acetaminophen (NORCO) 5-325 MG per tablet     Take 1 tablet by mouth every 6 (six) hours as needed for Pain     latanoprost (XALATAN) 0.005 % ophthalmic solution     Place 1 drop into both eyes nightly        metOLazone (ZAROXOLYN) 2.5 MG tablet     Take 1 tablet (2.5 mg total) by mouth daily as needed (if you gain 5 pounds)     metoprolol succinate (TOPROL-XL) 100 MG 24 hr tablet     Take 1 tablet (100 mg total) by mouth daily     polyethylene glycol (MIRALAX) 17 g packet     Take 17 g by mouth daily     potassium chloride (K-DUR) 10 MEQ tablet     Take 1 tablet (10 mEq total) by mouth daily     predniSONE (DELTASONE) 5 MG tablet     Take 1 tablet (5 mg total) by mouth every morning with breakfast     rosuvastatin (CRESTOR) 10 MG tablet     Take 1 tablet (10 mg total) by mouth daily     sertraline (ZOLOFT) 50 MG tablet     Take 1 tablet (50 mg total) by mouth nightly     spironolactone (ALDACTONE) 25 MG tablet     Take 1 tablet (25 mg total) by mouth daily           Review of Systems   Unable to perform ROS: Mental status change       Physical Exam    BP: (!) 128/91, Temp: 97.7 F (36.5 C), Resp Rate: 16, SpO2: 99 %, Weight: 79 kg    Physical Exam  Vitals and nursing note reviewed.   Constitutional:        General: She is not in acute distress.  HENT:      Head: Normocephalic and atraumatic.      Mouth/Throat:      Mouth: Mucous membranes are moist.      Pharynx: Oropharynx is clear.   Eyes:      General: No scleral icterus.     Extraocular Movements: Extraocular movements intact.      Pupils: Pupils are equal, round, and reactive to light. Pupils are equal.   Cardiovascular:      Rate and Rhythm: Normal rate and regular rhythm.      Heart sounds: Normal heart sounds.   Pulmonary:      Effort: Pulmonary effort is normal.      Breath sounds: Normal breath sounds. No wheezing, rhonchi or rales.   Abdominal:      General: There is no distension.      Palpations: Abdomen is soft.      Tenderness: There is no abdominal tenderness. There is no guarding.   Musculoskeletal:         General: No swelling or tenderness. Normal range of motion.      Cervical back: Normal range of motion and neck supple. No rigidity.   Lymphadenopathy:      Cervical: No cervical adenopathy.   Skin:     General: Skin is warm and dry.      Findings: Rash  present.      Comments: Bilateral lower legs with chronic venous stasis changes.  Right leg has open sores.  No overt signs of infection noted.   Neurological:      Mental Status: She is alert. She is disoriented.      Cranial Nerves: No cranial nerve deficit or facial asymmetry.      Sensory: No sensory deficit.      Motor: No weakness.   Psychiatric:         Mood and Affect: Mood normal.         Labs  Results     Procedure Component Value Units Date/Time    Arterial Blood Gas [[id number]]  (Abnormal) Collected: 08/07/19 1740    Specimen: Arterial Updated: 08/07/19 1754     Poonam Woehrle's Test Positive     pH, Arterial 7.43 pH      pCO2, Arterial 45.0 mm Hg      pO2, Arterial 130 mm Hg      HCO3, ABG 30.1 mMol/L      O2 Sat, Arterial 99.6 %      Base Excess, Arterial 5.2 mMol/L      Carboxyhemoglobin 1.4 %      Draw Site - ABG Left Radial Artery     O2 Lit Per Min 4 L/min      VIA NC    Urinalysis w  Microscopic and Culture if Indicated [[id number]]  (Abnormal) Collected: 08/07/19 1610    Specimen: Urine, Random Updated: 08/07/19 1651     Color, UA Yellow     Clarity, UA Clear     Urine Specific Gravity >=1.030     pH, Urine 5.0 pH      Protein, UR 1+ mg/dL      Glucose, UA Negative mg/dL      Ketones UA Negative mg/dL      Bilirubin, UA Negative mg/dL      Blood, UA Trace mg/dL      Nitrite, UA Negative     Urobilinogen, UA 0.2 mg/dL      Leukocyte Esterase, UA Negative Leu/uL      UR Micro Performed     WBC, UA 1-2 /hpf      RBC, UA 1-2 /hpf      Bacteria, UA None /hpf      Squam Epithel, UA 10-20 /lpf      Amorphous, UA Many /uL     CBC and differential [[id number]]  (Abnormal) Collected: 08/07/19 1525    Specimen: Blood Updated: 08/07/19 1604     WBC 10.0 K/cmm      RBC 4.21 M/cmm      Hemoglobin 11.1 gm/dL      Hematocrit 08.6 %      MCV 92 fL      MCH 26 pg      MCHC 29 gm/dL      RDW 57.8 %      PLT CT 119 K/cmm      MPV 10.0 fL      Neutrophils % 76.8 %      Lymphocytes 15.2 %      Monocytes 7.4 %      Eosinophils % 0.0 %      Basophils % 0.6 %      Neutrophils Absolute 7.7 K/cmm      Lymphocytes Absolute 1.5 K/cmm      Monocytes Absolute 0.7 K/cmm      Eosinophils Absolute 0.0 K/cmm      Basophils Absolute 0.1  K/cmm      RBC Morphology RBC Morphology Reviewed     Macrocytic 2+     Microcytic 1+     Anisocytosis 2+     Polychromasia 1+     Hypochromia 2+     Elliptocytes 2+     Poikilocytosis 2+     Target Cells 1+     Tear Drop Cells 1+    Comprehensive metabolic panel [[id number]]  (Abnormal) Collected: 08/07/19 1525    Specimen: Plasma Updated: 08/07/19 1557     Sodium 140 mMol/L      Potassium 4.1 mMol/L      Chloride 101 mMol/L      CO2 31.0 mMol/L      Calcium 10.2 mg/dL      Glucose 409 mg/dL      Creatinine 8.11 mg/dL      BUN 32 mg/dL      Protein, Total 5.6 gm/dL      Albumin 3.4 gm/dL      Alkaline Phosphatase 144 U/L      ALT 28 U/L      AST (SGOT) 32 U/L      Bilirubin, Total 1.7 mg/dL       Albumin/Globulin Ratio 1.55 Ratio      Anion Gap 12.1 mMol/L      BUN / Creatinine Ratio 34.4 Ratio      EGFR 58 mL/min/1.53m2      Osmolality Calculated 288 mOsm/kg      Globulin 2.2 gm/dL           Radiologic Studies  Radiology Results (24 Hour)     Procedure Component Value Units Date/Time    CT Head WO Contrast [[id number]] Collected: 08/07/19 1542    Order Status: Completed Updated: 08/07/19 1549    Narrative:      Clinical History:  Altered mental status    Examination:  CT HEAD WO CONTRAST    TECHNIQUE:  5 mm helical images obtained from the skull base through the vertex without contrast.  2.5 mm reconstructed bone algorithm, and 3 mm sagittal and coronal reformatted images provided.     CT images were acquired using Automated Exposure Control for dose reduction.     COMPARISON:   None available    FINDINGS:   BRAIN PARENCHYMA: No acute hemorrhage. No mass effect or herniation. Gray-white differentiation is maintained. White matter is within normal limits for age. Punctate left basal ganglia calcifications.    VENTRICLES/EXTRA-AXIAL SPACES: No hydrocephalus or extra-axial fluid collections. 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    EXTRACRANIAL STRUCTURES: Mastoid air cells and middle ears are clear. Paranasal sinuses are clear. Postoperative changes cataract surgery both globes. Mild bilateral proptosis. Scalp soft tissues are unremarkable. Benign hyperostosis frontalis interna,   greater on the left.      Impression:      1. No acute intracranial findings.    2. 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    ReadingStation:[id number]      .    EKG Results  Last EKG Result     None          MDM and ED Course     ED Medication Orders (From admission, onward)    None             MDM  Patient with altered mental state.  A accurate history and physical are not able to be currently obtained but  report is that patient has been  having confusion today and was sent in for evaluation.  Examination shows patient be afebrile with normal vital signs.  Exam itself does not show any acute or focal changes.    With patient having altered mental state that differential is quite expansive.  Therefore will obtain head CT as well as laboratory and urine testing.  Currently patient is stable and not showing any acute deterioration.    Testing results returned and did not demonstrate any acute etiologies.  There are some minor abnormalities but not enough to explain the patient's current state.  Patient does have open sores on her right leg but they do not appear to be grossly infected.  Patient has history of COPD so will obtain ABG to investigate for possible CO2 retention.     7:43 PM ABG does not show any CO2 retention.  Patient is remained cooperative but apparently confused.  Therefore I feel be appropriate for her to be admitted.  Therefore I contacted the admission team, Dr. Melanee Spry.  He agrees to further evaluate patient for appropriateness for admission.        Procedures    Clinical Impression & Disposition     Clinical Impression  Final diagnoses:   Altered mental status, unspecified altered mental status type        ED Disposition     ED Disposition Condition Date/Time Comment    Admit  Thu Aug 07, 2019  7:42 PM [name] Penix to be admitted.    Condition at disposition: Stable             New Prescriptions    No medications on file                 Chucky May, MD  08/07/19 1944

## 2019-08-07 NOTE — ED Notes (Signed)
Pt provided with po fluids per her request. She has been notified that the plan is admission, pt in agreement of this.

## 2019-08-07 NOTE — ED Notes (Signed)
Pt with fresh skin tear on R forearm. EMS states it was not there when they first picked her up. Cleaned with dermal wound cleaner, 4x4's applied and loosely wrapped with coban.

## 2019-08-07 NOTE — ED Notes (Signed)
RT at bedside.

## 2019-08-07 NOTE — H&P (Signed)
Medicine History & Physical   [facility]  [employer]   Patient Name: [name],[name] LOS: 0 days   Attending Physician: Chucky May, MD PCP: Graciela Husbands, DO      Assessment and Plan:                                                                1.  Altered mental status.  Cause unclear, though I am suspecting polypharmacy.  Patient is placed on observation status, in the medical surgical unit.  Will temporarily hold hydrocodone and Xanax.  Monitor the status.  For pain control, will utilize a brief course of ibuprofen (anticoagulation noted, but patient should be able to tolerate a couple of days of this).  Medicine regimen is not known for absolute certainty, family not immediately available and patient is too confused to give reliable history.  Will assume present medicine list to be accurate, and will verify as soon as practicable.    2.  Atrial fibrillation, chronic persistent.  Continue Eliquis 5 mg twice daily.  Rate control with oral XL 100 mg daily.  Diltiazem 240 mg was stopped most recent discharge    3.  History of gout.  Continue allopurinol 300 mg daily.  Appears patient is on chronic prednisone 5 mg daily    4.  COPD, does not appear to be in exacerbation.  Albuterol nebulizer 4 times daily.    5.  Congestive heart failure, chronic diastolic.  Continue spironolactone 25 mg daily and Toprol-XL 100 mg daily.  Lasix discontinued after most recent admission, see note on 07/10/2019.  On as needed metolazone outpatient.    6.  Hyperlipidemia, presumably stable.  Continue    7.  Coronary artery disease.  Eliquis in place of aspirin.  Control blood pressure and blood cholesterol as outlined above.    8.  Glaucoma.  Patient's eyedrops will be continued, Xalatan 0.005% 1 drop both eyes at bedtime.    9.  Chronic respiratory failure, on home oxygen 1 L this was weaned down from 4 L during most recent hospitalization).    10.  Anxiety.  Hold Xanax temporarily.    11.  Depression.   Continue Zoloft 50 mg at bedtime.    12.  Sick sinus syndrome, s/p pacemaker placement    13.  Chronic lower extremity wounds.  Patient had been treated with wound care    DVT PPx: Eliquis 5 mg twice daily  Dispo: Observation  Healthcare Proxy: Patient's daughters  Code: DNAR with support     History of Presenting Illness                                CC: Altered mental status  [name] is a 79 y.o. female patient, past medical history significant for anxious depression, congestive heart failure, atrial fibrillation.  Patient was sent to the emergency department at [facility], via EMS, due to altered mental status.  Time course is unclear, probably over the past day.  Emergency department staff attempted to contact family for clarification, without success.  In the emergency department, the patient was noted to be intermittently confused, oriented to self and place during those intervals when  she was not confused.  She is a regular patient in the [facility] emergency department, and the staff that know her have noticed a marked change from her baseline mental status.  She did note that she felt somewhat short of breath, when I interviewed her, and did say she had pain, but was unable to say exactly what was hurting.  Records reviewed, including emergency department records and prior hospital records.  Echocardiogram on 01/28/2019 demonstrates ejection fraction 55 to 60%, unable to assess diastolic function due to atrial fibrillation.  Severe biatrial enlargement, along with moderate to severe tricuspid regurgitation.  Most recent admission was from 06/24/2019 through 07/11/2019, for lower extremity cellulitis.  Past Medical History:   Diagnosis Date   . Aortic aneurysm    . Atrial fibrillation    . Chronic obstructive pulmonary disease    . Coronary artery disease    . Gastroesophageal reflux disease    . Hypertension    . Malignant neoplasm    . Pacemaker    . SSS (sick sinus syndrome)      Past  Surgical History:   Procedure Laterality Date   . CARDIAC PACEMAKER PLACEMENT      2002   . HYSTERECTOMY         Family History   Problem Relation Age of Onset   . Breast cancer Sister      Social History     Tobacco Use   . Smoking status: Never Smoker   . Smokeless tobacco: Never Used   Substance Use Topics   . Alcohol use: No   . Drug use: No       Subjective   Review of Systems:  Patient notes shortness of breath, denies chest pain.  Remaining review of systems unreliable due to mental status change        Objective   Physical Exam:     Vitals: T:97.4 F (36.3 C) (Tympanic),  BP:118/61, HR: , RR:16, SaO2:96%    1) General Appearance: Alert and oriented to self and place though extremely delayed and intermittently confused. In no acute distress.   2) Eyes: Pink conjunctiva, anicteric sclera. Pupils are equally reactive to light.  (per exam of Dr Freida Busman)      3) ENT: Oral mucosa moist with no pharyngeal congestion, erythema or swelling. (per exam of Dr Freida Busman)   4) Neck: Supple, with full range of motion. Trachea is central, no JVD noted  5) Chest: Clear to auscultation bilaterally, no wheezes rales or rhonchi.  6) CVS: normal rate and regular rhythm, with no murmurs or gallops.  7) Abdomen: Soft, non-tender, no rebound guarding or palpable mass. Bowel sounds normal (per exam of Dr Freida Busman)   8) Extremities: No pitting edema, pulses palpable, no calf swelling and no gross deformity. (per exam of Dr Freida Busman)   9) Skin: Warm, dry with normal skin turgor, no rash, except to note distal ulcerations  10) Lymphatics: No lymphadenopathy in cervical area.  (per exam of Dr Freida Busman)   11) Neurological: Cranial nerves II-XII intact. No gross focal motor or sensory deficits noted.  12) Psychiatric: Alert to self and place, though occasionally more confused.  Very delayed.        Patient Vitals for the past 12 hrs:   BP Temp Resp   08/07/19 2001 118/61 - -   08/07/19 1946 119/84 - -   08/07/19 1931 129/68 - -   08/07/19 1903 (!)  127/98 - -   08/07/19 1902 - 97.4  F (36.3 C) -   08/07/19 1833 115/56 - -   08/07/19 1817 120/67 - -   08/07/19 1802 99/83 - -   08/07/19 1731 122/72 - -   08/07/19 1716 117/73 - -   08/07/19 1701 120/46 - -   08/07/19 1646 123/66 - -   08/07/19 1631 116/82 - -   08/07/19 1616 128/68 - -   08/07/19 1602 125/62 - -   08/07/19 1546 126/60 - -   08/07/19 1542 (!) 125/105 - -   08/07/19 1516 - 97.7 F (36.5 C) 16   08/07/19 1514 (!) 128/91 - -     Weight Monitoring 07/06/2019 07/07/2019 07/08/2019 07/09/2019 07/10/2019 07/11/2019 08/07/2019   Height - - - - - - -   Height Method - - - - - - -   Weight 80.2 kg 80.1 kg 82.3 kg 83.4 kg 79.7 kg 79.4 kg 79 kg   Weight Method Bed Scale Bed Scale Bed Scale Bed Scale Bed Scale Bed Scale Actual   BMI (calculated) - - - - - - -          Recent Results (from the past 24 hour(s))   CBC and differential    Collection Time: 08/07/19  3:25 PM   Result Value Ref Range    WBC 10.0 4.0 - 11.0 K/cmm    RBC 4.21 3.80 - 5.00 M/cmm    Hemoglobin 11.1 (L) 12.0 - 16.0 gm/dL    Hematocrit 13.0 86.5 - 48.0 %    MCV 92 80 - 100 fL    MCH 26 (L) 28 - 35 pg    MCHC 29 (L) 31 - 36 gm/dL    RDW 78.4 (H) 69.6 - 14.5 %    PLT CT 119 (L) 130 - 440 K/cmm    MPV 10.0 6.0 - 10.0 fL    Neutrophils % 76.8 42.0 - 78.0 %    Lymphocytes 15.2 15.0 - 46.0 %    Monocytes 7.4 3.0 - 15.0 %    Eosinophils % 0.0 0.0 - 7.0 %    Basophils % 0.6 0.0 - 3.0 %    Neutrophils Absolute 7.7 1.7 - 8.6 K/cmm    Lymphocytes Absolute 1.5 0.6 - 5.1 K/cmm    Monocytes Absolute 0.7 0.1 - 1.7 K/cmm    Eosinophils Absolute 0.0 0.0 - 0.8 K/cmm    Basophils Absolute 0.1 0.0 - 0.3 K/cmm    RBC Morphology RBC Morphology Reviewed     Macrocytic 2+     Microcytic 1+     Anisocytosis 2+     Polychromasia 1+     Hypochromia 2+     Elliptocytes 2+     Poikilocytosis 2+     Target Cells 1+     Tear Drop Cells 1+    Comprehensive metabolic panel    Collection Time: 08/07/19  3:25 PM   Result Value Ref Range    Sodium 140 136 - 147 mMol/L    Potassium 4.1  3.5 - 5.3 mMol/L    Chloride 101 98 - 110 mMol/L    CO2 31.0 (H) 20.0 - 30.0 mMol/L    Calcium 10.2 8.5 - 10.5 mg/dL    Glucose 295 (H) 71 - 99 mg/dL    Creatinine 2.84 1.32 - 1.20 mg/dL    BUN 32 (H) 7 - 22 mg/dL    Protein, Total 5.6 (L) 6.0 - 8.3 gm/dL    Albumin 3.4 (L) 3.5 - 5.0 gm/dL  Alkaline Phosphatase 144 40 - 145 U/L    ALT 28 0 - 55 U/L    AST (SGOT) 32 10 - 42 U/L    Bilirubin, Total 1.7 (H) 0.1 - 1.2 mg/dL    Albumin/Globulin Ratio 1.55 0.80 - 2.00 Ratio    Anion Gap 12.1 7.0 - 18.0 mMol/L    BUN / Creatinine Ratio 34.4 (H) 10.0 - 30.0 Ratio    EGFR 58 (L) 60 - 150 mL/min/1.34m2    Osmolality Calculated 288 275 - 300 mOsm/kg    Globulin 2.2 2.0 - 4.0 gm/dL   Urinalysis w Microscopic and Culture if Indicated    Collection Time: 08/07/19  4:10 PM    Specimen: Urine, Random   Result Value Ref Range    Color, UA Yellow Colorless,Yellow    Clarity, UA Clear Clear    Urine Specific Gravity >=1.030 1.001 - 1.040    pH, Urine 5.0 5.0 - 8.0 pH    Protein, UR 1+ (A) Negative mg/dL    Glucose, UA Negative Negative mg/dL    Ketones UA Negative Negative mg/dL    Bilirubin, UA Negative Negative mg/dL    Blood, UA Trace (A) Negative mg/dL    Nitrite, UA Negative Negative    Urobilinogen, UA 0.2 0.2 mg/dL    Leukocyte Esterase, UA Negative Negative Leu/uL    UR Micro Performed     WBC, UA 1-2 1-2,3-4,None Seen /hpf    RBC, UA 1-2 None Seen,1-2,3-4 /hpf    Bacteria, UA None None /hpf    Squam Epithel, UA 10-20 (A) 1 - 5 /lpf    Amorphous, UA Many (A) None,Rare /uL   Arterial Blood Gas    Collection Time: 08/07/19  5:40 PM   Result Value Ref Range    Allen's Test Positive     pH, Arterial 7.43 7.35 - 7.45 pH    pCO2, Arterial 45.0 35.0 - 45.0 mm Hg    pO2, Arterial 130 (H) 75 - 100 mm Hg    HCO3, ABG 30.1 (H) 20.0 - 29.0 mMol/L    O2 Sat, Arterial 99.6 96.0 - 100.0 %    Base Excess, Arterial 5.2 (H) -2.0 - 2.0 mMol/L    Carboxyhemoglobin 1.4 0.0 - 19.9 %    Draw Site - ABG Left Radial Artery     O2 Lit Per Min 4 L/min     VIA NC         Allergies   Allergen Reactions   . Adhesive [Wound Dressing Adhesive]    . Codeine Hives     unknown     . Fentanyl      Other reaction(s): psychological reaction  Hallucinations,    . Morphine    . Morphine And Related Hives     unknown     . Nitroglycerin      Other reaction(s): neurological reaction   . Pain Patch [Menthol]    . Sulfa Antibiotics      unknown      CT Head WO Contrast    Result Date: 08/07/2019  1. No acute intracranial findings. 2. 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 ReadingStation:[id number]     Home Medications         Status Comment        08/07/2019  3:21 PM    Per family's list sent in with EMS.                albuterol (PROVENTIL) (2.5 MG/3ML) 0.083% nebulizer  solution     Take 2.5 mg by nebulization every 6 (six) hours as needed for Wheezing.     allopurinol (ZYLOPRIM) 300 MG tablet     Take 1 tablet (300 mg total) by mouth daily     ALPRAZolam (XANAX) 0.25 MG tablet     Take 0.5 tablets (0.125 mg total) by mouth every 6 (six) hours as needed for Anxiety     apixaban (ELIQUIS) 5 MG     Take 1 tablet (5 mg total) by mouth every 12 (twelve) hours     cadexomer iodine (Iodosorb) 0.9 % gel     Apply topically daily as needed for Wound Care Use as directed     fluorometholone (EFLONE) 0.1 % ophthalmic suspension     Place 1 drop into the left eye 4 (four) times daily        Gauze Pads & Dressings (Curity Iodoform Packing Strip) Misc     Use to pack wound on Mon, Wed and Fri     HYDROcodone-acetaminophen (NORCO) 5-325 MG per tablet     Take 1 tablet by mouth every 6 (six) hours as needed for Pain     latanoprost (XALATAN) 0.005 % ophthalmic solution     Place 1 drop into both eyes nightly        metOLazone (ZAROXOLYN) 2.5 MG tablet     Take 1 tablet (2.5 mg total) by mouth daily as needed (if you gain 5 pounds)     metoprolol succinate (TOPROL-XL) 100 MG 24 hr tablet     Take 1 tablet (100 mg total) by mouth daily     polyethylene glycol (MIRALAX) 17 g  packet     Take 17 g by mouth daily     potassium chloride (K-DUR) 10 MEQ tablet     Take 1 tablet (10 mEq total) by mouth daily     predniSONE (DELTASONE) 5 MG tablet     Take 1 tablet (5 mg total) by mouth every morning with breakfast     rosuvastatin (CRESTOR) 10 MG tablet     Take 1 tablet (10 mg total) by mouth daily     sertraline (ZOLOFT) 50 MG tablet     Take 1 tablet (50 mg total) by mouth nightly     spironolactone (ALDACTONE) 25 MG tablet     Take 1 tablet (25 mg total) by mouth daily         Meds given in the ED:  Medications - No data to display   Time Spent: 90 minutes     Charolotte Eke, MD     08/07/19,8:43 PM   MRN: [id number]                                      CSN: [id number] DOB: [birth year]     Referring Physician: ______Gregory Freida Busman MD____________________  Consulting Physician: _____Edward Melanee Spry MD____________________   / [facility]      Location of patient: ED at [facility]  Location of the provider: [facility]  Names of all persons participating in the telemedicine service and their role in the encounter:  Name:  ________________________         Role: ____RN_______________________  Name:   ________________________        Role: ___________________________  Type of evaluation performed: Admission from ED  *Please note this service was provided using telemedicine

## 2019-08-07 NOTE — ED Notes (Signed)
Tech in room to unwrap bilat lower legs for assessment for Dr Eliot Ford review of wounds.

## 2019-08-07 NOTE — EDIE (Signed)
COLLECTIVE?NOTIFICATION?08/07/2019 15:09?[name], [name]?MRN: [id number]    [facility]'s patient encounter information:   JXB:?[id number]  Account [id number]  Billing Account [id number]      Criteria Met      High Utilization (6+ ED Visits/6 Mo.)    Security and Safety  No recent Security Events currently on file    ED Care Guidelines  There are currently no ED Care Guidelines for this patient. Please check your facility's medical records system.        Prescription Monitoring Program  361??- Narcotic Use Score  241??- Sedative Use Score  000??- Stimulant Use Score  170??- Overdose Risk Score  - All Scores range from 000-999 with 75% of the population scoring < 200 and on 1% scoring above 650  - The last digit of the narcotic, sedative, and stimulant score indicates the number of active prescriptions of that type  - Higher Use scores correlate with increased prescribers, pharmacies, mg equiv, and overlapping prescriptions  - Higher Overdose Risk Scores correlate with increased risk of unintentional overdose death   Concerning or unexpectedly high scores should prompt a review of the PMP record; this does not constitute checking PMP for prescribing purposes.      E.D. Visit Count (12 mo.)  Facility Visits   [facility] 7   Total 7   Note: Visits indicate total known visits.     Recent Emergency Department Visit Summary  Date Facility [facility] Type Diagnoses or Chief Complaint   Aug 07, 2019 [facility] [place] [facility]      Altered Mental Status      Jun 20, 2019 [facility] [place] [facility]      Leg Pain, Leg Swelling      Leg Problem      Pain, unspecified      May 04, 2019 [facility] [place] [facility]      Difficulty Breathing      Shortness of Breath      Acute on chronic diastolic (congestive) heart failure      Acute systolic (congestive) heart failure      Apr 11, 2019 [facility] [place]  Emergency      chest pain/diffculty breathing      Chronic respiratory failure with hypoxia      Long term (current) use of anticoagulants      Acute on chronic diastolic (congestive) heart failure      Permanent atrial fibrillation      Do not resuscitate      Myocardial infarction type 2      Feb 11, 2019 [facility] [place] [facility]      CATHETER ISSUE      Leg Swelling      Urinary Incontinence      Urinary tract infection, site not specified      Infection and inflammatory reaction due to indwelling urethral catheter, initial encounter      Feb 08, 2019 [facility] [place] [facility]      Trouble Breathing      Shortness of Breath      Other retention of urine      Jan 24, 2019 [facility] [place] [facility]      difficulty breathing      Shortness of Breath      Chronic obstructive pulmonary disease with (acute)  exacerbation          Recent Inpatient Visit Summary  Date Facility [facility] Type Diagnoses or Chief Complaint   Jun 24, 2019 [facility] [place] [facility]      Pain, unspecified      Cellulitis of right lower limb      Jun 20, 2019 [facility] [place] [facility]      Non-pressure chronic ulcer of right calf with other specified severity      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      May 11, 2019 [facility] [place] [facility]      Acute on chronic diastolic (congestive) heart failure      Acute systolic (congestive) heart failure      May 04, 2019 [facility] [place] [facility]      Acute systolic (congestive) heart failure      Feb 27, 2019 [facility] [place] [facility]      Heart failure, unspecified      Acute on chronic systolic (congestive) heart failure      Acute on chronic diastolic (congestive) heart failure      Jan 24, 2019 [facility] [place] [facility]      Chronic obstructive pulmonary disease  with (acute) exacerbation          Care Team  Provider Specialty Phone Fax Service Dates   [facility]   Current      Collective Portal  This patient has registered at the [facility] Emergency Department   For more information visit: [email or web address]     PLEASE NOTE:     1.   Any care recommendations and other clinical information are provided as guidelines or for historical purposes only, and providers should exercise their own clinical judgment when providing care.    2.   You may only use this information for purposes of treatment, payment or health care operations activities, and subject to the limitations of applicable Collective Policies.    3.   You should consult directly with the organization that provided a care guideline or other clinical history with any questions about additional information or accuracy or completeness of information provided.    ? 2021 [place] - [email or web address]

## 2019-08-07 NOTE — ED Notes (Addendum)
The patient's informed consent to perform this consultation using telehealth tools was obtained: Yes    Telehealth patient education given prior to telehealth session:Yes    Start Time: 2017  End Time: 2024    Staff present during the patient's telehealth session:   BD RN

## 2019-08-07 NOTE — ED Notes (Signed)
Pt requesting pain medication. Spoke with Dr Melanee Spry and due to her AMS, no orders for pain medication given at this time.

## 2019-08-07 NOTE — ED Notes (Signed)
Pt's daughter, Elnita Maxwell, contacted and advised that pt is to be admitted. She is in agreement with plan. Went over medication list again and it is correct in chart. Metolazone given this AM due to 5lb weight gain over night.

## 2019-08-07 NOTE — ED Notes (Signed)
Spoke with Dr Freida Busman, pt to be admitted. Transfer center to be called shortly for assessment and orders.

## 2019-08-08 DIAGNOSIS — R410 Disorientation, unspecified: Secondary | ICD-10-CM

## 2019-08-08 MED ORDER — NYSTATIN 100000 UNIT/GM EX POWD
Freq: Two times a day (BID) | CUTANEOUS | Status: DC
Start: 2019-08-08 — End: 2019-08-09

## 2019-08-08 NOTE — Discharge Instr - [facility] (Signed)
HOME HEALTH     [facility] has been arranged to see you for home health care. The home health nurse will call you to arrange your first visit once you are discharged. If you have any questions prior to the visit please call the home health agency at [phone].

## 2019-08-08 NOTE — Progress Notes (Signed)
[name] was brought to the ED by EMS last evening because of altered mental status.  The work-up in the emergency room was fairly uneventful.  The patient had normal vital signs and SPO2.  It was felt by the admitting physician that her medications were likely involved.  I agree with this.  She is a very troubled chronically ill woman with congestive heart failure, chronic atrial fibrillation, severe venous insufficiency, and chronic raw open wound on the posterior medial aspect of the right lower leg above the medial malleolus (venous stasis ulcer), multiple hospitalizations, chronic oxygen management, chronic diuretic management, and a tenuous social structure at home.    She lives with her daughter and her son-in-law.  They apparently inhabit the basement of the home.  According to our patient, they like to drink tequila and smoke cigarettes.  Nonetheless I do think her daughter tries to take care of her during the day.  She does administer her medications.  The daughter was available for conversation last evening by phone.  She was not available in the ED in person.    On admission the hydrocodone and Xanax were held.  This morning the patient looks drowsy but she is able to carry on conversation.  She was able to accurately repeat a 5 number sequence to me.  I concluded from that that she was able to pay attention and was not in delirium.    Vitals:    08/08/19 0926   BP:    Pulse:    Resp:    Temp:    SpO2: 97%     On exam she is plethoric and chronically ill in appearance.  She is complaining of being in pain and she says she is having trouble breathing.  She is on supplemental oxygen.  An arterial blood gas done last evening on 4 L disclosed a PO2 of 130 mmHg.  Lungs are clear and tidal volume is adequate  Her cardiac rhythm is irregular.  S1-S2 are heard.  No murmurs are noted.  The rate is well controlled  Abdomen is soft and nontender  The lower extremities have deep pigmentation from chronic venous  insufficiency.  There is a ragged looking venous stasis ulcer on the posterior lateral aspect of the right leg which we have been following for months.  It is not infected.  It is a source of chronic pain.    Results     Procedure Component Value Units Date/Time    Arterial Blood Gas [[id number]]  (Abnormal) Collected: 08/07/19 1740    Specimen: Arterial Updated: 08/07/19 1754     Allen's Test Positive     pH, Arterial 7.43 pH      pCO2, Arterial 45.0 mm Hg      pO2, Arterial 130 mm Hg      HCO3, ABG 30.1 mMol/L      O2 Sat, Arterial 99.6 %      Base Excess, Arterial 5.2 mMol/L      Carboxyhemoglobin 1.4 %      Draw Site - ABG Left Radial Artery     O2 Lit Per Min 4 L/min      VIA NC    Urinalysis w Microscopic and Culture if Indicated [[id number]]  (Abnormal) Collected: 08/07/19 1610    Specimen: Urine, Random Updated: 08/07/19 1651     Color, UA Yellow     Clarity, UA Clear     Urine Specific Gravity >=1.030     pH, Urine 5.0 pH  Protein, UR 1+ mg/dL      Glucose, UA Negative mg/dL      Ketones UA Negative mg/dL      Bilirubin, UA Negative mg/dL      Blood, UA Trace mg/dL      Nitrite, UA Negative     Urobilinogen, UA 0.2 mg/dL      Leukocyte Esterase, UA Negative Leu/uL      UR Micro Performed     WBC, UA 1-2 /hpf      RBC, UA 1-2 /hpf      Bacteria, UA None /hpf      Squam Epithel, UA 10-20 /lpf      Amorphous, UA Many /uL     CBC and differential [[id number]]  (Abnormal) Collected: 08/07/19 1525    Specimen: Blood Updated: 08/07/19 1604     WBC 10.0 K/cmm      RBC 4.21 M/cmm      Hemoglobin 11.1 gm/dL      Hematocrit 62.1 %      MCV 92 fL      MCH 26 pg      MCHC 29 gm/dL      RDW 30.8 %      PLT CT 119 K/cmm      MPV 10.0 fL      Neutrophils % 76.8 %      Lymphocytes 15.2 %      Monocytes 7.4 %      Eosinophils % 0.0 %      Basophils % 0.6 %      Neutrophils Absolute 7.7 K/cmm      Lymphocytes Absolute 1.5 K/cmm      Monocytes Absolute 0.7 K/cmm      Eosinophils Absolute 0.0 K/cmm      Basophils Absolute 0.1  K/cmm      RBC Morphology RBC Morphology Reviewed     Macrocytic 2+     Microcytic 1+     Anisocytosis 2+     Polychromasia 1+     Hypochromia 2+     Elliptocytes 2+     Poikilocytosis 2+     Target Cells 1+     Tear Drop Cells 1+    Comprehensive metabolic panel [[id number]]  (Abnormal) Collected: 08/07/19 1525    Specimen: Plasma Updated: 08/07/19 1557     Sodium 140 mMol/L      Potassium 4.1 mMol/L      Chloride 101 mMol/L      CO2 31.0 mMol/L      Calcium 10.2 mg/dL      Glucose 952 mg/dL      Creatinine 8.41 mg/dL      BUN 32 mg/dL      Protein, Total 5.6 gm/dL      Albumin 3.4 gm/dL      Alkaline Phosphatase 144 U/L      ALT 28 U/L      AST (SGOT) 32 U/L      Bilirubin, Total 1.7 mg/dL      Albumin/Globulin Ratio 1.55 Ratio      Anion Gap 12.1 mMol/L      BUN / Creatinine Ratio 34.4 Ratio      EGFR 58 mL/min/1.30m2      Osmolality Calculated 288 mOsm/kg      Globulin 2.2 gm/dL         Active Hospital Problems    Diagnosis   . Delirium   . Venous stasis ulcer of right calf with fat layer exposed without varicose veins   .  Stage 3a chronic kidney disease   . Pulmonary HTN   . Chronic respiratory failure with hypoxia, on home O2 therapy     Plan: This case is difficult.  We did not arrive at these medications she is taking willy-nilly.  She is a woman who is always in pain and the pain was managed with oxycodone during her previous hospitalization.  When she went home from our hospital on 3/11 she was placed on hydrocodone for pain.  The dose was 5 mg every 6 hours.  She had responded nicely to alprazolam regarding her anxiety.  She was given 0.25 mg tablets and told to take a half a tablet every 6 hours if needed.  I understand that her daughter is distributing the medicine at home.  She was also on sertraline 50 mg nightly.  I agree with the opinion of Dr. Melanee Spry who admitted her last evening regarding the medications.  At the time of discharge they did not appear to be causing altered mental status however.  I  should also say that we did have discussion about placing this woman in hospice but in the end we opted to stick with home care nursing as her biggest problem appeared to be a venous stasis ulcer.  Her heart failure was under good control.  She was able to get up and transfer.  We did not feel that death was imminent.  That said, her chances of having a productive life are nil.    Electronically signed by Graciela Husbands D.O.

## 2019-08-08 NOTE — Progress Notes (Signed)
79 yo female who admits to observation status with diagnosis of AMS. Upon arrival to room, patient sitting in chair with oxygen on. Patient noted to be alert and oriented to person and place. She voiced that it was 1951 and that she came to the hospital because of her breathing. Patient has granted CM permission to speak with her daughter Elnita Maxwell and CM will call her later today. Patient continues to live in a 1 level home with a ramped entrance. Her daughter, Elnita Maxwell and her son-in-law live with her but work outside of the home. On good days, patient is still  able to bathe, dress and toilet self. Daughter can assist as needed. Patient's son-in-law does most of the housework, cooking, shopping and transportation. Patient is ambulatory with a FWW. She also has a SPC, BSC, hospital bed, nebulizer and oxygen with portability. Patient gets her oxygen supplies through [facility]. Patient voices that she is still receiving nursing and therapy services through [facility]. A referral has been placed in [facility] for [facility]. Patient continues to have Medicare/FEP BCBS for insurance coverage. She has RX coverage and uses [facility] for RX needs. Dr. Amada Jupiter is her PCP with last office visit being 06/11/2019. Patient states at time of discharge she would like to return home but voices that she "might have to go to a nursing home". Patient voices that she is becoming "too much" for her daughter. Patient stated that she has "some money" to pay for a facility. Discussed ALF options. Patient declined wanting [facility] but states that [facility] would be "ok". Freedom of Choice for Home Health and ALF completed and placed in chart. Will call and speak with daughter later today about discharge plan. CM will continue to assess needs and intervene as indicated.      08/08/19 0755   Patient Type   Within 30 Days of Previous Admission? Yes  (observation status)   Healthcare Decisions   Interviewed: Patient;Family  (permission  granted to call Elnita Maxwell)   Orientation/Decision Making Abilities of Patient Confused, unable to make decisions  (patient noted to be alert to person and place)   Level of Function 2 Weeks Prior to Admission   Prior level of function Needs assistance with ADLs;Ambulates with assistive device   Level of Function Upon Admission   Current Level of Function Needs assistance with ADLs;Ambulates with assistive device   Prior to admission   Type of Residence Private residence   Home Layout One level;Ramped entrance   Have running water, electricity, heat, etc? Yes   Living Arrangements Children   How do you get to your MD appointments? family   How do you get your groceries? family   Who fixes your meals? family   Who does your laundry? family   Who picks up your prescriptions? family   DME Currently at Home ADL- 3-in-1 Bedside Commode;Cane, Single Point;Walker, Front Wheel;Hospital Bed;Portable O2 Tank;Nebulizer;Home O2   Home Care/Community Services Home Health;Home PT/OT/Speech   Name of Agency [facility]   Discharge Planning   Support Systems Children   Patient expects to be discharged to: home vs ALF   Anticipated Ward plan discussed with: Same as interviewed   Mode of transportation: Private car (family member)   Does the patient have perscription coverage? Yes   Consults/Providers   Correct PCP listed in Epic? Yes   Family and PCP   PCP on file was verified as the current PCP? Yes   In case you are admitted,  would like family notified?   (aware of admission)   In case you are admitted, would like your PCP notified?   (aware of admission)   Important Message from [facility] Notice   Patient received 1st IMM Letter? n/a

## 2019-08-08 NOTE — Progress Notes (Signed)
Spoke with patient's daughter, [name]. Per [name], medications are managed and administered by her([name]). She did mention that patient was given some Xanax ~30 minutes to an hour prior to coming to the hospital. Talked about discharge plan. At this time, [name] states that plan is for her mother to come back home but voices that patient is becoming physically more difficult to manage. [name] states that her sister [name] will be coming in on Sunday and that they will all discuss plans for [name]. Discussed that we would have [facility] resume care at time of discharge. We discussed possible need for hiring some private caregivers to assist vs ALF. Told her that with the insurance her mother currently has, there would be no coverage for additional assistance, including nursing home. Approximate cost of private caregivers vs ALF vs LTC provided. Encouraged [name] to reach out to [facility] to determine bed availability, cost, etc. Since there is no definitive plan with patient/family until after speaking with [name] on Sunday, patient will likely discharge home with [facility]. [name] is aware that patient will likely discharge home tomorrow with resumption of Home Health services. [name] voiced that she would reach out to CM on Monday, if she has any further questions.

## 2019-08-08 NOTE — Progress Notes (Signed)
Readmission Risk  [facility] - MEDICAL/SURGICAL   Patient Name: [name]   Attending Physician: Graciela Husbands, DO   Today's date:   08/08/2019 LOS: 0 days   Expected Discharge Date      Readmission Assessment:                                                              Discharge Planning  Does the patient have perscription coverage?: (P) Yes  Utilize [place] [facility] Program: (P) N/A  Confirmed PCP with Pt: (P) Yes  Confirmed PCP name: (P) Dr. Amada Jupiter  Last PCP Visit Date: (P) 06/11/2019  Confirm Transport to F/U Appt.: (P) Self/Private Vehicle/Friend  DME anticipated at discharge: (P) (no known DME needs)  Anticipated Home Health at [place]: (P) Yes  Name of Home Health Agency Placement: (P) [facility]  Referral made for home health RN visit?: (P) Yes  Anticipated Placement at [place]: (P) (possible ALF placement)  CM Comments: (P) 4/9 home with [facility] vs ALF admission       IDPA:   Patient Type  Within 30 Days of Previous Admission?: Yes(observation status)  Healthcare Decisions  Interviewed:: Patient, Family(permission granted to call Elnita Maxwell)  Orientation/Decision Making Abilities of Patient: Confused, unable to make decisions(patient noted to be alert to person and place)  Advance Directive: Patient has advance directive, copy in chart  Healthcare Agent Appointed: Yes  Prior to admission  Prior level of function: Needs assistance with ADLs, Ambulates with assistive device  Type of Residence: Private residence  Home Layout: One level, Ramped entrance  Have running water, electricity, heat, etc?: Yes  Living Arrangements: Children  How do you get to your MD appointments?: family  How do you get your groceries?: family  Who fixes your meals?: family  Who does your laundry?: family  Who picks up your prescriptions?: family  Dressing: Needs assistance  Grooming: Needs assistance  Feeding: Independent  Bathing: Needs assistance  Toileting: Needs assistance  DME Currently at Home: ADL- 3-in-1  Bedside Commode, Cane, Single Point, [employer], [employer], [employer], Portable O2 Tank, [employer], Home O2  Home Care/Community Services: Home Health, Home PT/OT/Speech  Name of Agency: [facility]  Discharge Planning  Support Systems: Children  Patient expects to be discharged to:: home vs ALF  Anticipated [place] plan discussed with:: Same as interviewed  Mode of transportation:: Private car (family member)  Does the patient have perscription coverage?: (P) Yes  Consults/Providers  Correct PCP listed in Epic?: Yes  Family and PCP  PCP on file was verified as the current PCP?: Yes  In case you are admitted, would like family notified?: (aware of admission)  In case you are admitted, would like your PCP notified?: (aware of admission)   30 Day Readmission:       Provider Notifications:            08/08/19 0807   Discharge Planning   Does the patient have perscription coverage? Yes   Utilize [place] [facility] N/A   Confirmed PCP with Pt Yes   Confirmed PCP name Dr. Amada Jupiter   Last PCP Visit Date 06/11/2019   Confirm Transport to F/U Appt. Self/Private Vehicle/Friend   DME anticipated at discharge   (no known DME needs)  Anticipated Home Health at [place] Yes   Name of Home Health Agency Placement [facility]   Referral made for home health RN visit? Yes   Anticipated Placement at [place]   (possible ALF placement)   CM Comments 4/9 home with [facility] vs ALF admission

## 2019-08-08 NOTE — UM Notes (Signed)
[facility] Utilization Management Review Sheet    Facility :  [facility]    NAME: [name]  MR#: [id number]    CSN#: [id number]    ROOM: 203/203-A AGE: 79 y.o.    ADMIT DATE AND TIME: 08/07/2019  3:09 PM      PATIENT CLASS:  Observation  <>  08/07/2019 8:42 PM    ATTENDING PHYSICIAN: [name] Husbands, DO  PAYOR:Payor: MEDICARE / Plan: MEDICARE PART A AND B / Product Type: Medicare /       AUTH #:     DIAGNOSIS:     ICD-10-CM    1. Altered mental status, unspecified altered mental status type  R41.82        HISTORY:   Past Medical History:   Diagnosis Date    Aortic aneurysm     Atrial fibrillation     Chronic obstructive pulmonary disease     Coronary artery disease     Gastroesophageal reflux disease     Hypertension     Malignant neoplasm     Pacemaker     SSS (sick sinus syndrome)        DATE OF REVIEW: 08/08/2019    VITALS: BP 123/70    Pulse 81    Temp 98.2 F (36.8 C) (Oral)    Resp 18    Ht 1.638 m (5' 4.5")    Wt 73.8 kg (162 lb 11.2 oz)    SpO2 97%    BMI 27.50 kg/m     Active Hospital Problems    Diagnosis    Delirium    Venous stasis ulcer of right calf with fat layer exposed without varicose veins    Stage 3a chronic kidney disease    Pulmonary HTN    Chronic respiratory failure with hypoxia, on home O2 therapy     Chief Complaint   Patient presents with    Altered Mental Status     Patient brought in for evaluation of altered mental status.  Patient family called for EMS to have her transported.  Patient herself only complains of just not feeling well.  Patient is oriented to self and place but not to date.  In asking patient questions, her results do not seem reliable.  They do not always present consistently.  Therefore an accurate HPI and accurate review of systems are not obtainable due to her altered mental state.  However personnel here in the hospital who do know this patient or the department patient is not "herself".    Comments: Bilateral lower legs with chronic  venous stasis changes.  Right leg has open sores.  No overt signs of infection noted.   Neurological:      Mental Status: She is alert. She is disoriented    CT Head<>   Impression:       1. No acute intracranial findings.    2. 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       Labs<> Po2 130. Ph 7.43. Oxygen 4L per nc.         H&P<>Dr [name]<>    79 y.o. female patient, past medical history significant for anxious depression, congestive heart failure, atrial fibrillation.  Patient was sent to the emergency department at [facility], via EMS, due to altered mental status.  Time course is unclear, probably over the past day.  Emergency department staff attempted to contact family for clarification, without success.  In the emergency department, the patient was noted to be  intermittently confused, oriented to self and place during those intervals when she was not confused.  She is a regular patient in the [facility] emergency department, and the staff that know her have noticed a marked change from her baseline mental status.  She did note that she felt somewhat short of breath, when I interviewed her, and did say she had pain, but was unable to say exactly what was hurting.  Records reviewed, including emergency department records and prior hospital records.  Echocardiogram on 01/28/2019 demonstrates ejection fraction 55 to 60%, unable to assess diastolic function due to atrial fibrillation.  Severe biatrial enlargement, along with moderate to severe tricuspid regurgitation.  Most recent admission was from 06/24/2019 through 07/11/2019, for lower extremity cellulitis.    Assessment and Plan:                                                                1.  Altered mental status.  Cause unclear, though I am suspecting polypharmacy.  Patient is placed on observation status, in the medical surgical unit.  Will temporarily hold hydrocodone and Xanax.  Monitor the status.  For pain control, will  utilize a brief course of ibuprofen (anticoagulation noted, but patient should be able to tolerate a couple of days of this).  Medicine regimen is not known for absolute certainty, family not immediately available and patient is too confused to give reliable history.  Will assume present medicine list to be accurate, and will verify as soon as practicable.    2.  Atrial fibrillation, chronic persistent.  Continue Eliquis 5 mg twice daily.  Rate control with oral XL 100 mg daily.  Diltiazem 240 mg was stopped most recent discharge    3.  History of gout.  Continue allopurinol 300 mg daily.  Appears patient is on chronic prednisone 5 mg daily    4.  COPD, does not appear to be in exacerbation.  Albuterol nebulizer 4 times daily.    5.  Congestive heart failure, chronic diastolic.  Continue spironolactone 25 mg daily and Toprol-XL 100 mg daily.  Lasix discontinued after most recent admission, see note on 07/10/2019.  On as needed metolazone outpatient.    6.  Hyperlipidemia, presumably stable.  Continue    7.  Coronary artery disease.  Eliquis in place of aspirin.  Control blood pressure and blood cholesterol as outlined above.    8.  Glaucoma.  Patient's eyedrops will be continued, Xalatan 0.005% 1 drop both eyes at bedtime.    9.  Chronic respiratory failure, on home oxygen 1 L this was weaned down from 4 L during most recent hospitalization).    10.  Anxiety.  Hold Xanax temporarily.    11.  Depression.  Continue Zoloft 50 mg at bedtime.    12.  Sick sinus syndrome, s/p pacemaker placement    13.  Chronic lower extremity wounds.  Patient had been treated with wound care    DVT PPx: Eliquis 5 mg twice daily  Dispo: Observation       Observation   ML. Proventil neb Q6H. Prednisone 5mg  po Q morning. Tylenol 650mg  po Q4H prn.      CSR<>08/08/2019<> Dr [name]    On exam she is plethoric and chronically ill in appearance.  She is  complaining of being in pain and she says she is having trouble breathing.  She is  on supplemental oxygen.  An arterial blood gas done last evening on 4 L disclosed a PO2 of 130 mmHg.  Lungs are clear and tidal volume is adequate  Her cardiac rhythm is irregular.  S1-S2 are heard.  No murmurs are noted.  The rate is well controlled  Abdomen is soft and nontender  The lower extremities have deep pigmentation from chronic venous insufficiency.  There is a ragged looking venous stasis ulcer on the posterior lateral aspect of the right leg which we have been following for months.  It is not infected.  It is a source of chronic pain.    Plan: This case is difficult.  We did not arrive at these medications she is taking willy-nilly.  She is a woman who is always in pain and the pain was managed with oxycodone during her previous hospitalization.  When she went home from our hospital on 3/11 she was placed on hydrocodone for pain.  The dose was 5 mg every 6 hours.  She had responded nicely to alprazolam regarding her anxiety.  She was given 0.25 mg tablets and told to take a half a tablet every 6 hours if needed.  I understand that her daughter is distributing the medicine at home.  She was also on sertraline 50 mg nightly.  I agree with the opinion of Dr. Melanee Spry who admitted her last evening regarding the medications.  At the time of discharge they did not appear to be causing altered mental status however.  I should also say that we did have discussion about placing this woman in hospice but in the end we opted to stick with home care nursing as her biggest problem appeared to be a venous stasis ulcer.  Her heart failure was under good control.  She was able to get up and transfer.  We did not feel that death was imminent.  That said, her chances of having a productive life are nil.        Meets MCG<> [id number]    Lowella Dell RN,CCM Caralee Ates    [facility]  [address]  [place], [place] [address]    Ph: [phone]  Fx: [phone]

## 2019-08-09 DIAGNOSIS — J9611 Chronic respiratory failure with hypoxia: Secondary | ICD-10-CM

## 2019-08-09 DIAGNOSIS — I482 Chronic atrial fibrillation, unspecified: Secondary | ICD-10-CM

## 2019-08-09 DIAGNOSIS — F0390 Unspecified dementia without behavioral disturbance: Secondary | ICD-10-CM

## 2019-08-09 DIAGNOSIS — N1831 Chronic kidney disease, stage 3a: Secondary | ICD-10-CM

## 2019-08-09 NOTE — Progress Notes (Signed)
08/09/19 0840   Patient Assessment   Status Completed   RT Priority 3   Pulmonary History COPD   Airway type Other (Comment)  (natural)   Surgical Status None   Mobility Ambulatory with or without assistance   CXR None in past 72 hr   Cough Non-productive;Spontaneous   Heart Rate 80   Resp Rate 16   Bilateral Breath Sounds Diminished   Location Specific No   Home regimen   Home Treatments Albuterol neb PRN   Home Oxygen yes  (patient is weaning)   Home CPAP/BiLevel none   Oxygen Therapy   SpO2 97 %   O2 Device Nasal cannula   O2 Flow Rate (L/min) 1 L/min  (decreased from 2L)   Incentive Spirometry   Baseline Spirometry Level (mL)   (N/A)   Criteria for Therapy   Secretion Clearance (BPH) None   Lung Expansion None   Respiratory Medications Other (please specify)  (Patient is taking treatments at this time)   Oxygen Mild/Moderate: O2 < or equal to 5 lpm NC to maintain SpO2> or equal to 90%   Severity Index   Severity Index MILD   Outcomes   Secretion Clearance (BPH)    (N/A)   Lung expansion    (N/A)   Respiratory medication Patient is on maintenance or home regimen  (PRN)   Oxygen goals Maintain room air SpO2 > 90%   Performing Departments   O2 Device performing department formula [id number]   POX check performing department formula [phone]   Resp assess adult performing department formula [phone]   Nebs given performing department formula [id number]   Oxygen performing department formula [phone]   RCAT completed at this time.

## 2019-08-09 NOTE — Discharge Summary (Signed)
Medicine Discharge Summary   [name]  [facility]   Patient Name: [name], [name]   Attending Physician: Graciela Husbands, DO PCP: Graciela Husbands, DO   Date of Admission: 08/07/2019 D/C Date: 08/09/2019   Discharge Diagnoses:     Acute delirium likely due to medications (hydrocodone/alprazolam)  Mild dementia  Chronic hypoxic respiratory failure  Chronic atrial fibrillation  Chronic kidney disease, stage IIIa     Hospital Course       [name] is a 79 y.o. female patient that was admitted on 08/07/2019 she presented here via EMS.  Family had called because of confusion.  I spoke to her daughter Elnita Maxwell today who told me that she was fearful that her mother and had a stroke.  The patient has chronic atrial fibrillation and is anticoagulated with apixaban.  On arrival there were no focal neurological problems except for some confusion and altered mental status.  It was felt that taking hydrocodone and alprazolam at home had caused some of this.  Her daughter tells me that she rarely gets alprazolam but she had had one that evening prior to the onset of the confusion.  The patient's oxygen status was good.  Chest x-ray was nonacute.  ECG revealed an atrial fibrillation mechanism with a controlled ventricular response.  She did not appear to be in overt heart failure and there is no evidence of infection including urinary tract.  On the second hospital day I saw her.  She was able to repeat a 5 number sequence indicating ability to pay attention.  She was not complaining of severe pain she did not appear overtly anxious and in fact she appeared sedated.  She had not had any alprazolam or hydrocodone and has not had any during this entire admission.  She has a painful venous stasis ulcer on the posteromedial aspect of the right calf which continues.  Home care is still seeing her for this.  Home care will continue at the time of discharge.  I did  stop the patient's statin at the time of discharge.  I reviewed her other meds.  We are just using metolazone as needed for weight gain.  She is on daily spironolactone.  She is not on a loop blocking diuretic.  She has no edema at the time of discharge.  I appreciate case management discussing the situation with her family.  She has a daughter who lives in [place] who is coming home today.  She will talk with her sister who lives with our patient and come up with a plan.  Currently the options include hospice care, more help in the home, and an assisted living facility.    Principal Problem:    Delirium  Active Problems:    Chronic respiratory failure with hypoxia, on home O2 therapy    Pulmonary HTN    Stage 3a chronic kidney disease    Venous stasis ulcer of right calf with fat layer exposed without varicose veins  Resolved Problems:    * No resolved hospital problems. *     Pending Results and other significant studies:  None     Discharge Instructions:          Disposition: Home with Home Health  Diet: Regular Diet  Activity: As tolerated  Discharge Code Status: NO CPR - SUPPORT OK    No follow-up provider specified.     Discharge Medications:  Discharge Medication List      Taking    albuterol (2.5 MG/3ML) 0.083% nebulizer solution  Dose: 2.5 mg  Commonly known as: PROVENTIL  Take 2.5 mg by nebulization every 6 (six) hours as needed for Wheezing.     allopurinol 300 MG tablet  Dose: 300 mg  Commonly known as: ZYLOPRIM  Take 1 tablet (300 mg total) by mouth daily     apixaban 5 MG  Dose: 5 mg  Commonly known as: ELIQUIS  Take 1 tablet (5 mg total) by mouth every 12 (twelve) hours     Curity Iodoform Packing Strip Misc  Use to pack wound on Mon, Wed and Fri     fluorometholone 0.1 % ophthalmic suspension  Dose: 1 drop  Commonly known as: EFLONE  Place 1 drop into the left eye 4 (four) times daily      HYDROcodone-acetaminophen 5-325 MG  per tablet  Dose: 1 tablet  Commonly known as: NORCO  Take 1 tablet by mouth every 6 (six) hours as needed for Pain     Iodosorb 0.9 % gel  Generic drug: cadexomer iodine  Apply topically daily as needed for Wound Care Use as directed     latanoprost 0.005 % ophthalmic solution  Dose: 1 drop  Commonly known as: XALATAN  Place 1 drop into both eyes nightly      metOLazone 2.5 MG tablet  Dose: 2.5 mg  Commonly known as: ZAROXOLYN  Take 1 tablet (2.5 mg total) by mouth daily as needed (if you gain 5 pounds)     metoprolol succinate 100 MG 24 hr tablet  Dose: 100 mg  Commonly known as: Toprol XL  Take 1 tablet (100 mg total) by mouth daily     polyethylene glycol 17 g packet  Dose: 17 g  Commonly known as: MIRALAX  Take 17 g by mouth daily     potassium chloride 10 MEQ tablet  Dose: 10 mEq  Commonly known as: K-DUR  Take 1 tablet (10 mEq total) by mouth daily     predniSONE 5 MG tablet  Dose: 5 mg  Commonly known as: DELTASONE  Take 1 tablet (5 mg total) by mouth every morning with breakfast     sertraline 50 MG tablet  Dose: 50 mg  Commonly known as: ZOLOFT  Take 1 tablet (50 mg total) by mouth nightly     spironolactone 25 MG tablet  Dose: 25 mg  Commonly known as: ALDACTONE  Take 1 tablet (25 mg total) by mouth daily        STOP taking these medications    ALPRAZolam 0.25 MG tablet  Commonly known as: XANAX     rosuvastatin 10 MG tablet  Commonly known as: CRESTOR             Discharge Day Exam (08/09/2019):     Blood pressure 124/66, pulse 80, temperature 98.4 F (36.9 C), temperature source Oral, resp. rate 16, height 1.638 m (5' 4.5"), weight 73.8 kg (162 lb 11.2 oz), SpO2 97 %.         Recent Labs      Recent Labs   Lab 08/07/19  1525   WBC 10.0   RBC 4.21   Hemoglobin 11.1*   Hematocrit 38.8   MCV 92   PLT CT 119*             No results found for: [id number]  Recent Labs   Lab 08/07/19  1525   Glucose 122*  Sodium 140   Potassium 4.1   Chloride 101   CO2 31.0*   BUN 32*   Creatinine 0.93   EGFR 58*   Calcium  10.2     Recent Labs   Lab 08/07/19  1525   Albumin 3.4*   Protein, Total 5.6*   Bilirubin, Total 1.7*   Alkaline Phosphatase 144   ALT 28   AST (SGOT) 32        Allergies:      Adhesive [wound dressing adhesive], Codeine, Fentanyl, Morphine, Morphine and related, Nitroglycerin, Pain patch [menthol], and Sulfa antibiotics   Time spent on discharging the patient:  30 minutes   CT Head WO Contrast    Result Date: 08/07/2019  1. No acute intracranial findings. 2. 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 ReadingStation:[email or web address]     Home Health Needs:  There are no questions and answers to display.      Graciela Husbands, DO         08/09/19 10:32 AM   MRN: [id number]                                      CSN: [id number] DOB: [birth year]

## 2019-08-09 NOTE — Progress Notes (Signed)
1900-0700    Pt A&Ox2. Forgetful to time, situation. Pt can be easily re-oriented. 1L Oxygen via NC in use. Up to [facility] to void x1 assist and walker. BLE elevated on pillow. Tylenol given twice this shift. Bed alarm in place. Call bell within reach.

## 2019-08-11 NOTE — Progress Notes (Signed)
Received call from patient's daughter Neysa Bonito. Multiple questions answered regarding ALF vs LTC, options with Home Health, Hospice, private caregivers, etc. Neysa Bonito provided with telephone number for [facility] to inquire about private caregivers. She states that they have someone coming to help out during the day but may need some assistance at night. Also encouraged Christy/sister to visit ALF's now to take tours, inquire about pricing/bed availability, etc- that way they will be prepared if/when time comes for their mother to admit to an ALF. Offered for [facility] to call back with any further questions/concerns. Understanding and appreciation voiced.

## 2019-08-12 ENCOUNTER — Telehealth (RURAL_HEALTH_CENTER): Payer: Self-pay | Admitting: Internal Medicine

## 2019-08-12 NOTE — Telephone Encounter (Signed)
OK 

## 2019-08-12 NOTE — Telephone Encounter (Signed)
Call from [place] with [facility] that wanted to let Dr Amada Jupiter know that patient is being discharged today from [facility].

## 2019-08-12 NOTE — Telephone Encounter (Signed)
Dr. Amada Jupiter.... This is an FYI only.Marland KitchenMarland KitchenMarland KitchenMarland Kitchenpkt

## 2019-08-14 ENCOUNTER — Telehealth (RURAL_HEALTH_CENTER): Payer: Self-pay

## 2019-08-14 NOTE — Telephone Encounter (Signed)
Daughter called and states they are wanting to place her mom ( [name] ) in [place].....Marland Kitchen I am sure we can fill out Admission physical from [facility] admission.... Agreed ?Marland Kitchen... But pt will also need Covid testing... Is this ok to place this order for that test at the Respiratory clinic ? Also they are asking for a refill hydrocodone.... I told them if you agreed to refill it would only be a couple days worth until pt is placed in [place].... Last refill on hydrocodone was 07/18/19 # 90 tabs.Marland KitchenMarland KitchenMarland KitchenMarland Kitchenpkt

## 2019-08-14 NOTE — Telephone Encounter (Signed)
Ok. I am under water at the moment. We will have to get to this. Thx

## 2019-08-18 ENCOUNTER — Other Ambulatory Visit (RURAL_HEALTH_CENTER): Payer: Self-pay | Admitting: Internal Medicine

## 2019-08-18 ENCOUNTER — Other Ambulatory Visit (RURAL_HEALTH_CENTER): Payer: Self-pay

## 2019-08-18 ENCOUNTER — Telehealth: Payer: Self-pay

## 2019-08-18 DIAGNOSIS — Z022 Encounter for examination for admission to residential institution: Secondary | ICD-10-CM

## 2019-08-18 MED ORDER — HYDROCODONE-ACETAMINOPHEN 5-325 MG PO TABS
1.00 | ORAL_TABLET | Freq: Four times a day (QID) | ORAL | 0 refills | Status: AC | PRN
Start: 2019-08-18 — End: ?

## 2019-08-18 NOTE — Telephone Encounter (Signed)
Forms filled out, covid test scheduled, daughter has been informed...pkt

## 2019-08-18 NOTE — Telephone Encounter (Signed)
Please schedule device check appt. Monitor is not connected. Letter sent as well.     Thanks,   [place]

## 2019-08-19 ENCOUNTER — Ambulatory Visit: Payer: Medicare Other

## 2019-08-19 ENCOUNTER — Other Ambulatory Visit
Admission: RE | Admit: 2019-08-19 | Discharge: 2019-08-19 | Disposition: A | Discharge status: Home / Self Care | Payer: Medicare Other | Source: Ambulatory Visit | Attending: Internal Medicine | Admitting: Internal Medicine

## 2019-08-19 DIAGNOSIS — Z022 Encounter for examination for admission to residential institution: Secondary | ICD-10-CM

## 2019-08-19 DIAGNOSIS — Z1159 Encounter for screening for other viral diseases: Secondary | ICD-10-CM

## 2019-08-19 NOTE — Telephone Encounter (Signed)
Thank you :)

## 2019-08-19 NOTE — Telephone Encounter (Signed)
-  called this pt and her daughter Elnita Maxwell stated the pt is in the process of being admitted into nursing home. She said she will call back soon to discuss, but she can be reached at [phone]. smb

## 2019-08-20 LAB — VH SARS-COV-2 ASSAY (PERKINELMER SYSTEM(TM))
Does patient have symptoms related to condition of interest?: NEGATIVE
Does patient reside in a congregate care setting?: NEGATIVE
Is patient employed in a healthcare setting?: NEGATIVE
Is the patient hospitalized because of this condition?: NEGATIVE
Is the patient pregnant?: NEGATIVE
SARS-CoV-2 Assay (PerkinElmer System (TM)): NOT DETECTED

## 2019-08-21 ENCOUNTER — Telehealth (RURAL_HEALTH_CENTER): Payer: Self-pay

## 2019-08-21 NOTE — Telephone Encounter (Signed)
-----   Message from Graciela Husbands, DO sent at 08/20/2019 12:08 PM EDT -----  Covid negative. Tell fam/ pt if you can Thx

## 2019-08-21 NOTE — Telephone Encounter (Signed)
Daughter has been informed and paperwork has been faxed to [facility].Marland KitchenMarland KitchenMarland KitchenMarland Kitchenpkt

## 2019-08-30 DEATH — deceased

## 2019-09-10 ENCOUNTER — Ambulatory Visit (RURAL_HEALTH_CENTER): Payer: Self-pay | Admitting: Internal Medicine
